# Patient Record
Sex: Female | Born: 1950 | Race: White | Hispanic: No | Marital: Married | Smoking: Never smoker
Health system: Southern US, Community
[De-identification: ages and names within clinical notes are randomized; demographics above are authoritative.]

## PROBLEM LIST (undated history)

## (undated) DIAGNOSIS — Z801 Family history of malignant neoplasm of trachea, bronchus and lung: Secondary | ICD-10-CM

## (undated) DIAGNOSIS — E119 Type 2 diabetes mellitus without complications: Secondary | ICD-10-CM

## (undated) DIAGNOSIS — Z8489 Family history of other specified conditions: Secondary | ICD-10-CM

## (undated) DIAGNOSIS — M1990 Unspecified osteoarthritis, unspecified site: Secondary | ICD-10-CM

## (undated) DIAGNOSIS — T7840XA Allergy, unspecified, initial encounter: Secondary | ICD-10-CM

## (undated) DIAGNOSIS — K5190 Ulcerative colitis, unspecified, without complications: Secondary | ICD-10-CM

## (undated) DIAGNOSIS — E079 Disorder of thyroid, unspecified: Secondary | ICD-10-CM

## (undated) DIAGNOSIS — H269 Unspecified cataract: Secondary | ICD-10-CM

## (undated) DIAGNOSIS — T8859XA Other complications of anesthesia, initial encounter: Secondary | ICD-10-CM

## (undated) DIAGNOSIS — Z800 Family history of malignant neoplasm of digestive organs: Secondary | ICD-10-CM

## (undated) DIAGNOSIS — G4730 Sleep apnea, unspecified: Secondary | ICD-10-CM

## (undated) DIAGNOSIS — I10 Essential (primary) hypertension: Secondary | ICD-10-CM

## (undated) DIAGNOSIS — K219 Gastro-esophageal reflux disease without esophagitis: Secondary | ICD-10-CM

## (undated) DIAGNOSIS — Z923 Personal history of irradiation: Secondary | ICD-10-CM

## (undated) HISTORY — DX: Family history of malignant neoplasm of trachea, bronchus and lung: Z80.1

## (undated) HISTORY — DX: Allergy, unspecified, initial encounter: T78.40XA

## (undated) HISTORY — PX: CHOLECYSTECTOMY: SHX55

## (undated) HISTORY — DX: Type 2 diabetes mellitus without complications: E11.9

## (undated) HISTORY — DX: Disorder of thyroid, unspecified: E07.9

## (undated) HISTORY — DX: Unspecified osteoarthritis, unspecified site: M19.90

## (undated) HISTORY — DX: Unspecified cataract: H26.9

## (undated) HISTORY — DX: Family history of malignant neoplasm of digestive organs: Z80.0

## (undated) HISTORY — DX: Essential (primary) hypertension: I10

---
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
## 1999-02-04 ENCOUNTER — Encounter: Payer: Self-pay | Admitting: Obstetrics and Gynecology

## 1999-02-04 ENCOUNTER — Encounter: Admission: RE | Admit: 1999-02-04 | Discharge: 1999-02-04 | Payer: Self-pay | Admitting: Obstetrics and Gynecology

## 2000-02-08 ENCOUNTER — Encounter: Payer: Self-pay | Admitting: Obstetrics and Gynecology

## 2000-02-08 ENCOUNTER — Encounter: Admission: RE | Admit: 2000-02-08 | Discharge: 2000-02-08 | Payer: Self-pay | Admitting: Obstetrics and Gynecology

## 2001-02-15 ENCOUNTER — Encounter: Admission: RE | Admit: 2001-02-15 | Discharge: 2001-02-15 | Payer: Self-pay | Admitting: Obstetrics and Gynecology

## 2001-02-15 ENCOUNTER — Encounter: Payer: Self-pay | Admitting: Obstetrics and Gynecology

## 2001-07-18 ENCOUNTER — Ambulatory Visit (HOSPITAL_COMMUNITY): Admission: RE | Admit: 2001-07-18 | Discharge: 2001-07-18 | Payer: Self-pay | Admitting: Gastroenterology

## 2002-02-18 ENCOUNTER — Encounter: Payer: Self-pay | Admitting: Obstetrics and Gynecology

## 2002-02-18 ENCOUNTER — Encounter: Admission: RE | Admit: 2002-02-18 | Discharge: 2002-02-18 | Payer: Self-pay | Admitting: Obstetrics and Gynecology

## 2002-05-05 ENCOUNTER — Encounter: Admission: RE | Admit: 2002-05-05 | Discharge: 2002-05-05 | Payer: Self-pay | Admitting: *Deleted

## 2002-05-19 ENCOUNTER — Encounter: Admission: RE | Admit: 2002-05-19 | Discharge: 2002-08-17 | Payer: Self-pay

## 2003-02-20 ENCOUNTER — Encounter: Admission: RE | Admit: 2003-02-20 | Discharge: 2003-02-20 | Payer: Self-pay | Admitting: Obstetrics and Gynecology

## 2003-06-04 ENCOUNTER — Ambulatory Visit (HOSPITAL_BASED_OUTPATIENT_CLINIC_OR_DEPARTMENT_OTHER): Admission: RE | Admit: 2003-06-04 | Discharge: 2003-06-04 | Payer: Self-pay | Admitting: Family Medicine

## 2004-03-08 ENCOUNTER — Encounter: Admission: RE | Admit: 2004-03-08 | Discharge: 2004-03-08 | Payer: Self-pay | Admitting: Obstetrics and Gynecology

## 2006-06-07 ENCOUNTER — Encounter: Admission: RE | Admit: 2006-06-07 | Discharge: 2006-06-07 | Payer: Self-pay | Admitting: Obstetrics and Gynecology

## 2007-06-11 ENCOUNTER — Encounter: Admission: RE | Admit: 2007-06-11 | Discharge: 2007-06-11 | Payer: Self-pay | Admitting: Obstetrics and Gynecology

## 2008-07-09 ENCOUNTER — Encounter: Admission: RE | Admit: 2008-07-09 | Discharge: 2008-07-09 | Payer: Self-pay | Admitting: Obstetrics and Gynecology

## 2008-12-22 ENCOUNTER — Encounter: Admission: RE | Admit: 2008-12-22 | Discharge: 2008-12-22 | Payer: Self-pay | Admitting: Obstetrics and Gynecology

## 2009-01-26 ENCOUNTER — Ambulatory Visit (HOSPITAL_COMMUNITY): Admission: RE | Admit: 2009-01-26 | Discharge: 2009-01-27 | Payer: Self-pay | Admitting: General Surgery

## 2009-01-26 ENCOUNTER — Encounter (INDEPENDENT_AMBULATORY_CARE_PROVIDER_SITE_OTHER): Payer: Self-pay | Admitting: General Surgery

## 2009-07-12 ENCOUNTER — Encounter: Admission: RE | Admit: 2009-07-12 | Discharge: 2009-07-12 | Payer: Self-pay | Admitting: Obstetrics and Gynecology

## 2010-04-10 LAB — GLUCOSE, CAPILLARY
Glucose-Capillary: 124 mg/dL — ABNORMAL HIGH (ref 70–99)
Glucose-Capillary: 130 mg/dL — ABNORMAL HIGH (ref 70–99)
Glucose-Capillary: 138 mg/dL — ABNORMAL HIGH (ref 70–99)
Glucose-Capillary: 147 mg/dL — ABNORMAL HIGH (ref 70–99)

## 2010-04-25 LAB — DIFFERENTIAL
Basophils Absolute: 0 10*3/uL (ref 0.0–0.1)
Basophils Relative: 0 % (ref 0–1)
Eosinophils Absolute: 0.1 10*3/uL (ref 0.0–0.7)
Eosinophils Relative: 2 % (ref 0–5)
Lymphocytes Relative: 35 % (ref 12–46)
Lymphs Abs: 2.6 10*3/uL (ref 0.7–4.0)
Monocytes Absolute: 0.5 10*3/uL (ref 0.1–1.0)
Monocytes Relative: 7 % (ref 3–12)
Neutro Abs: 4.2 10*3/uL (ref 1.7–7.7)
Neutrophils Relative %: 56 % (ref 43–77)

## 2010-04-25 LAB — CBC
HCT: 40.1 % (ref 36.0–46.0)
Hemoglobin: 13.9 g/dL (ref 12.0–15.0)
MCHC: 34.6 g/dL (ref 30.0–36.0)
MCV: 88.5 fL (ref 78.0–100.0)
Platelets: 289 10*3/uL (ref 150–400)
RBC: 4.53 MIL/uL (ref 3.87–5.11)
RDW: 13.2 % (ref 11.5–15.5)
WBC: 7.4 10*3/uL (ref 4.0–10.5)

## 2010-04-25 LAB — COMPREHENSIVE METABOLIC PANEL
ALT: 30 U/L (ref 0–35)
AST: 22 U/L (ref 0–37)
Albumin: 4 g/dL (ref 3.5–5.2)
Alkaline Phosphatase: 74 U/L (ref 39–117)
BUN: 12 mg/dL (ref 6–23)
CO2: 28 mEq/L (ref 19–32)
Calcium: 9.9 mg/dL (ref 8.4–10.5)
Chloride: 103 mEq/L (ref 96–112)
Creatinine, Ser: 0.55 mg/dL (ref 0.4–1.2)
GFR calc Af Amer: >60 mL/min (ref >60)
GFR calc non Af Amer: >60 mL/min (ref >60)
Glucose, Bld: 122 mg/dL — ABNORMAL HIGH (ref 70–99)
Potassium: 4.4 mEq/L (ref 3.5–5.1)
Sodium: 138 mEq/L (ref 135–145)
Total Bilirubin: 0.6 mg/dL (ref 0.3–1.2)
Total Protein: 7 g/dL (ref 6.0–8.3)

## 2010-04-25 LAB — PROTIME-INR
INR: 0.97 (ref 0.00–1.49)
Prothrombin Time: 12.8 seconds (ref 11.6–15.2)

## 2010-06-06 ENCOUNTER — Other Ambulatory Visit: Payer: Self-pay | Admitting: Family Medicine

## 2010-06-06 DIAGNOSIS — Z1231 Encounter for screening mammogram for malignant neoplasm of breast: Secondary | ICD-10-CM

## 2010-06-10 NOTE — Procedures (Signed)
[place]  Patient:    Michelle, Sherman Visit Number: [id number] MRN: [id number]          Service Type: END Location: ENDO Attending Physician:  Juanita Craver Dictated by:   Nelwyn Salisbury, M.D. Proc. Date: 07/18/01 Admit Date:  07/18/2001   CC:         Suszanne Conners, M.D.   Procedure Report  DATE OF BIRTH:  August 13, 1920  PROCEDURE PERFORMED:  Screening colonoscopy  ENDOSCOPIST:  Nelwyn Salisbury, M.D.  INSTRUMENT:  Olympus video colonoscope.  INDICATION FOR PROCEDURE:  Rectal bleeding in a 60 year old white female with a family history of Crohns disease.  Rule out colonic polyps, masses, hemorrhoids, etc.  PREPROCEDURE PREPARATION:  Informed consent was procured from the patient. The patient was fasting for eight hours prior to the procedure and prepped with a bottle of magnesium citrate and a gallon of NuLytely the night prior to the procedure.  PREPROCEDURE PHYSICAL:  VITAL SIGNS:  Stable.  NECK:  Supple.  CHEST:  Clear to auscultation.  HEART:  S1, S2 regular.  ABDOMEN:  Soft with normal abdominal bowel sounds.  DESCRIPTION OF THE PROCEDURE:  The patient was placed in the left lateral decubitus position and sedated with 100 mg of Demerol and 10 mg of Versed intravenously.  Once the patient was adequately sedated and maintained on low-flow oxygen and continuous cardiac monitoring, the Olympus video colonoscope was advanced from the rectum to the cecum without difficulty. There was evidence of large left-sided diverticula and small nonbleeding internal hemorrhoids seen on retroflexion in the rectum.  The rest of the colonic mucosa up to the cecum appeared normal and healthy.  The appendiceal orifice and the ileocecal valve were clearly visualized and photographed.  The right colon, transverse colon, and proximal left colon appeared normal.  IMPRESSION: 1. Small nonbleeding internal hemorrhoids. 2. Left-sided  diverticulosis. 3. Normal-appearing proximal left colon, transverse colon, right colon, and    cecum.  RECOMMENDATIONS: 1. High-fiber diet with liberal fluid intake has bee advocated; 15 to    20 g of fiber in the diet has been advised. 2. Outpatient followup is advised in the next two weeks. Dictated by:   Nelwyn Salisbury, M.D. Attending Physician:  Juanita Craver DD:  07/18/01 TD:  07/19/01 Job: [id number]

## 2010-06-28 DIAGNOSIS — G4733 Obstructive sleep apnea (adult) (pediatric): Secondary | ICD-10-CM | POA: Insufficient documentation

## 2010-07-14 ENCOUNTER — Ambulatory Visit
Admission: RE | Admit: 2010-07-14 | Discharge: 2010-07-14 | Disposition: A | Discharge status: Home / Self Care | Source: Ambulatory Visit | Attending: Family Medicine | Admitting: Family Medicine

## 2010-07-14 DIAGNOSIS — Z1231 Encounter for screening mammogram for malignant neoplasm of breast: Secondary | ICD-10-CM

## 2010-10-04 ENCOUNTER — Other Ambulatory Visit: Payer: Self-pay | Admitting: Family Medicine

## 2010-10-04 DIAGNOSIS — E039 Hypothyroidism, unspecified: Secondary | ICD-10-CM | POA: Insufficient documentation

## 2010-10-04 DIAGNOSIS — Z780 Asymptomatic menopausal state: Secondary | ICD-10-CM

## 2010-10-04 DIAGNOSIS — M1710 Unilateral primary osteoarthritis, unspecified knee: Secondary | ICD-10-CM | POA: Insufficient documentation

## 2010-10-07 ENCOUNTER — Ambulatory Visit
Admission: RE | Admit: 2010-10-07 | Discharge: 2010-10-07 | Disposition: A | Discharge status: Home / Self Care | Source: Ambulatory Visit | Attending: Family Medicine | Admitting: Family Medicine

## 2010-10-07 DIAGNOSIS — Z780 Asymptomatic menopausal state: Secondary | ICD-10-CM

## 2010-10-17 DIAGNOSIS — K648 Other hemorrhoids: Secondary | ICD-10-CM | POA: Insufficient documentation

## 2010-10-17 DIAGNOSIS — K5730 Diverticulosis of large intestine without perforation or abscess without bleeding: Secondary | ICD-10-CM | POA: Insufficient documentation

## 2010-10-17 HISTORY — DX: Other hemorrhoids: K64.8

## 2010-10-17 HISTORY — DX: Diverticulosis of large intestine without perforation or abscess without bleeding: K57.30

## 2011-06-08 ENCOUNTER — Other Ambulatory Visit: Payer: Self-pay | Admitting: Family Medicine

## 2011-06-08 DIAGNOSIS — Z1231 Encounter for screening mammogram for malignant neoplasm of breast: Secondary | ICD-10-CM

## 2011-07-25 ENCOUNTER — Ambulatory Visit
Admission: RE | Admit: 2011-07-25 | Discharge: 2011-07-25 | Disposition: A | Discharge status: Home / Self Care | Source: Ambulatory Visit | Attending: Family Medicine | Admitting: Family Medicine

## 2011-07-25 DIAGNOSIS — Z1231 Encounter for screening mammogram for malignant neoplasm of breast: Secondary | ICD-10-CM

## 2011-08-23 LAB — HM DIABETES EYE EXAM

## 2011-10-05 DIAGNOSIS — K5190 Ulcerative colitis, unspecified, without complications: Secondary | ICD-10-CM | POA: Insufficient documentation

## 2011-10-05 DIAGNOSIS — J309 Allergic rhinitis, unspecified: Secondary | ICD-10-CM | POA: Insufficient documentation

## 2012-01-15 DIAGNOSIS — E1169 Type 2 diabetes mellitus with other specified complication: Secondary | ICD-10-CM | POA: Insufficient documentation

## 2012-06-19 ENCOUNTER — Other Ambulatory Visit: Payer: Self-pay

## 2012-06-19 DIAGNOSIS — Z1231 Encounter for screening mammogram for malignant neoplasm of breast: Secondary | ICD-10-CM

## 2012-07-25 ENCOUNTER — Ambulatory Visit
Admission: RE | Admit: 2012-07-25 | Discharge: 2012-07-25 | Disposition: A | Discharge status: Home / Self Care | Source: Ambulatory Visit

## 2012-07-25 DIAGNOSIS — Z1231 Encounter for screening mammogram for malignant neoplasm of breast: Secondary | ICD-10-CM

## 2013-06-30 ENCOUNTER — Other Ambulatory Visit: Payer: Self-pay

## 2013-06-30 DIAGNOSIS — Z1231 Encounter for screening mammogram for malignant neoplasm of breast: Secondary | ICD-10-CM

## 2013-07-28 ENCOUNTER — Ambulatory Visit
Admission: RE | Admit: 2013-07-28 | Discharge: 2013-07-28 | Disposition: A | Discharge status: Home / Self Care | Source: Ambulatory Visit

## 2013-07-28 ENCOUNTER — Encounter (INDEPENDENT_AMBULATORY_CARE_PROVIDER_SITE_OTHER): Payer: Self-pay

## 2013-07-28 DIAGNOSIS — Z1231 Encounter for screening mammogram for malignant neoplasm of breast: Secondary | ICD-10-CM

## 2013-08-29 ENCOUNTER — Encounter: Payer: Self-pay | Admitting: Family Medicine

## 2013-08-29 LAB — HM DIABETES EYE EXAM

## 2014-06-25 ENCOUNTER — Other Ambulatory Visit: Payer: Self-pay

## 2014-06-25 DIAGNOSIS — Z1231 Encounter for screening mammogram for malignant neoplasm of breast: Secondary | ICD-10-CM

## 2014-07-31 ENCOUNTER — Ambulatory Visit
Admission: RE | Admit: 2014-07-31 | Discharge: 2014-07-31 | Disposition: A | Discharge status: Home / Self Care | Payer: Medicare Other | Source: Ambulatory Visit

## 2014-07-31 DIAGNOSIS — Z1231 Encounter for screening mammogram for malignant neoplasm of breast: Secondary | ICD-10-CM

## 2014-09-04 LAB — HM DIABETES EYE EXAM

## 2014-10-22 LAB — HM PAP SMEAR: HM Pap smear: NORMAL

## 2014-10-27 ENCOUNTER — Other Ambulatory Visit: Payer: Self-pay | Admitting: Family Medicine

## 2014-10-27 DIAGNOSIS — E2839 Other primary ovarian failure: Secondary | ICD-10-CM

## 2014-12-30 ENCOUNTER — Ambulatory Visit
Admission: RE | Admit: 2014-12-30 | Discharge: 2014-12-30 | Disposition: A | Discharge status: Home / Self Care | Payer: Medicare Other | Source: Ambulatory Visit | Attending: Family Medicine | Admitting: Family Medicine

## 2014-12-30 DIAGNOSIS — E2839 Other primary ovarian failure: Secondary | ICD-10-CM

## 2015-02-15 DIAGNOSIS — M65331 Trigger finger, right middle finger: Secondary | ICD-10-CM | POA: Diagnosis not present

## 2015-02-15 DIAGNOSIS — M7581 Other shoulder lesions, right shoulder: Secondary | ICD-10-CM | POA: Diagnosis not present

## 2015-02-15 DIAGNOSIS — M7701 Medial epicondylitis, right elbow: Secondary | ICD-10-CM | POA: Diagnosis not present

## 2015-02-15 DIAGNOSIS — G5601 Carpal tunnel syndrome, right upper limb: Secondary | ICD-10-CM | POA: Diagnosis not present

## 2015-02-16 DIAGNOSIS — K5190 Ulcerative colitis, unspecified, without complications: Secondary | ICD-10-CM | POA: Diagnosis not present

## 2015-02-16 DIAGNOSIS — K5730 Diverticulosis of large intestine without perforation or abscess without bleeding: Secondary | ICD-10-CM | POA: Diagnosis not present

## 2015-05-17 DIAGNOSIS — J0100 Acute maxillary sinusitis, unspecified: Secondary | ICD-10-CM | POA: Diagnosis not present

## 2015-05-17 DIAGNOSIS — J069 Acute upper respiratory infection, unspecified: Secondary | ICD-10-CM | POA: Diagnosis not present

## 2015-05-26 DIAGNOSIS — Z6841 Body Mass Index (BMI) 40.0 and over, adult: Secondary | ICD-10-CM | POA: Diagnosis not present

## 2015-05-26 DIAGNOSIS — E1159 Type 2 diabetes mellitus with other circulatory complications: Secondary | ICD-10-CM | POA: Diagnosis not present

## 2015-05-26 DIAGNOSIS — E119 Type 2 diabetes mellitus without complications: Secondary | ICD-10-CM | POA: Diagnosis not present

## 2015-05-26 DIAGNOSIS — I10 Essential (primary) hypertension: Secondary | ICD-10-CM | POA: Diagnosis not present

## 2015-05-26 DIAGNOSIS — K5100 Ulcerative (chronic) pancolitis without complications: Secondary | ICD-10-CM | POA: Diagnosis not present

## 2015-07-23 ENCOUNTER — Other Ambulatory Visit: Payer: Self-pay | Admitting: Family Medicine

## 2015-07-23 DIAGNOSIS — Z1231 Encounter for screening mammogram for malignant neoplasm of breast: Secondary | ICD-10-CM

## 2015-08-02 ENCOUNTER — Ambulatory Visit
Admission: RE | Admit: 2015-08-02 | Discharge: 2015-08-02 | Disposition: A | Discharge status: Home / Self Care | Payer: Medicare Other | Source: Ambulatory Visit | Attending: Family Medicine | Admitting: Family Medicine

## 2015-08-02 DIAGNOSIS — Z1231 Encounter for screening mammogram for malignant neoplasm of breast: Secondary | ICD-10-CM | POA: Diagnosis not present

## 2015-08-26 DIAGNOSIS — Z6835 Body mass index (BMI) 35.0-35.9, adult: Secondary | ICD-10-CM | POA: Diagnosis not present

## 2015-08-26 DIAGNOSIS — E1169 Type 2 diabetes mellitus with other specified complication: Secondary | ICD-10-CM | POA: Diagnosis not present

## 2015-08-26 DIAGNOSIS — E119 Type 2 diabetes mellitus without complications: Secondary | ICD-10-CM | POA: Diagnosis not present

## 2015-08-26 DIAGNOSIS — Z1159 Encounter for screening for other viral diseases: Secondary | ICD-10-CM | POA: Diagnosis not present

## 2015-08-26 DIAGNOSIS — E559 Vitamin D deficiency, unspecified: Secondary | ICD-10-CM | POA: Diagnosis not present

## 2015-08-26 DIAGNOSIS — I10 Essential (primary) hypertension: Secondary | ICD-10-CM | POA: Diagnosis not present

## 2015-08-26 DIAGNOSIS — E039 Hypothyroidism, unspecified: Secondary | ICD-10-CM | POA: Diagnosis not present

## 2015-08-26 DIAGNOSIS — E782 Mixed hyperlipidemia: Secondary | ICD-10-CM | POA: Diagnosis not present

## 2015-08-26 DIAGNOSIS — E1159 Type 2 diabetes mellitus with other circulatory complications: Secondary | ICD-10-CM | POA: Diagnosis not present

## 2015-08-26 LAB — HM HEPATITIS C SCREENING LAB: HM Hepatitis Screen: NEGATIVE

## 2015-09-23 DIAGNOSIS — H2513 Age-related nuclear cataract, bilateral: Secondary | ICD-10-CM | POA: Diagnosis not present

## 2015-09-23 DIAGNOSIS — E119 Type 2 diabetes mellitus without complications: Secondary | ICD-10-CM | POA: Diagnosis not present

## 2015-09-23 DIAGNOSIS — H5213 Myopia, bilateral: Secondary | ICD-10-CM | POA: Diagnosis not present

## 2015-09-23 LAB — HM DIABETES EYE EXAM

## 2015-10-08 DIAGNOSIS — S8992XA Unspecified injury of left lower leg, initial encounter: Secondary | ICD-10-CM | POA: Diagnosis not present

## 2015-12-06 DIAGNOSIS — E119 Type 2 diabetes mellitus without complications: Secondary | ICD-10-CM | POA: Diagnosis not present

## 2015-12-06 DIAGNOSIS — Z0000 Encounter for general adult medical examination without abnormal findings: Secondary | ICD-10-CM | POA: Diagnosis not present

## 2015-12-06 DIAGNOSIS — M533 Sacrococcygeal disorders, not elsewhere classified: Secondary | ICD-10-CM | POA: Diagnosis not present

## 2015-12-06 DIAGNOSIS — Z23 Encounter for immunization: Secondary | ICD-10-CM | POA: Diagnosis not present

## 2015-12-06 DIAGNOSIS — D1721 Benign lipomatous neoplasm of skin and subcutaneous tissue of right arm: Secondary | ICD-10-CM | POA: Diagnosis not present

## 2015-12-24 DIAGNOSIS — R2231 Localized swelling, mass and lump, right upper limb: Secondary | ICD-10-CM | POA: Diagnosis not present

## 2015-12-24 DIAGNOSIS — I10 Essential (primary) hypertension: Secondary | ICD-10-CM | POA: Diagnosis not present

## 2015-12-24 HISTORY — PX: SKIN SURGERY: SHX2413

## 2015-12-27 DIAGNOSIS — R2231 Localized swelling, mass and lump, right upper limb: Secondary | ICD-10-CM | POA: Insufficient documentation

## 2016-01-21 DIAGNOSIS — Z79899 Other long term (current) drug therapy: Secondary | ICD-10-CM | POA: Diagnosis not present

## 2016-01-21 DIAGNOSIS — E785 Hyperlipidemia, unspecified: Secondary | ICD-10-CM | POA: Diagnosis not present

## 2016-01-21 DIAGNOSIS — Z882 Allergy status to sulfonamides status: Secondary | ICD-10-CM | POA: Diagnosis not present

## 2016-01-21 DIAGNOSIS — E039 Hypothyroidism, unspecified: Secondary | ICD-10-CM | POA: Diagnosis not present

## 2016-01-21 DIAGNOSIS — E11618 Type 2 diabetes mellitus with other diabetic arthropathy: Secondary | ICD-10-CM | POA: Diagnosis not present

## 2016-01-21 DIAGNOSIS — D171 Benign lipomatous neoplasm of skin and subcutaneous tissue of trunk: Secondary | ICD-10-CM | POA: Diagnosis not present

## 2016-01-21 DIAGNOSIS — E669 Obesity, unspecified: Secondary | ICD-10-CM | POA: Diagnosis not present

## 2016-01-21 DIAGNOSIS — Z6837 Body mass index (BMI) 37.0-37.9, adult: Secondary | ICD-10-CM | POA: Diagnosis not present

## 2016-01-21 DIAGNOSIS — R222 Localized swelling, mass and lump, trunk: Secondary | ICD-10-CM | POA: Diagnosis not present

## 2016-01-21 DIAGNOSIS — I10 Essential (primary) hypertension: Secondary | ICD-10-CM | POA: Diagnosis not present

## 2016-01-21 DIAGNOSIS — M1990 Unspecified osteoarthritis, unspecified site: Secondary | ICD-10-CM | POA: Diagnosis not present

## 2016-01-21 DIAGNOSIS — G4730 Sleep apnea, unspecified: Secondary | ICD-10-CM | POA: Diagnosis not present

## 2016-01-21 DIAGNOSIS — Z7984 Long term (current) use of oral hypoglycemic drugs: Secondary | ICD-10-CM | POA: Diagnosis not present

## 2016-01-21 DIAGNOSIS — K219 Gastro-esophageal reflux disease without esophagitis: Secondary | ICD-10-CM | POA: Diagnosis not present

## 2016-01-21 DIAGNOSIS — Z7982 Long term (current) use of aspirin: Secondary | ICD-10-CM | POA: Diagnosis not present

## 2016-01-21 DIAGNOSIS — R2231 Localized swelling, mass and lump, right upper limb: Secondary | ICD-10-CM | POA: Diagnosis not present

## 2016-01-28 DIAGNOSIS — Z9889 Other specified postprocedural states: Secondary | ICD-10-CM | POA: Diagnosis not present

## 2016-01-28 DIAGNOSIS — Z86018 Personal history of other benign neoplasm: Secondary | ICD-10-CM | POA: Diagnosis not present

## 2016-02-14 DIAGNOSIS — J40 Bronchitis, not specified as acute or chronic: Secondary | ICD-10-CM | POA: Diagnosis not present

## 2016-02-14 DIAGNOSIS — I10 Essential (primary) hypertension: Secondary | ICD-10-CM | POA: Diagnosis not present

## 2016-02-17 DIAGNOSIS — K5190 Ulcerative colitis, unspecified, without complications: Secondary | ICD-10-CM | POA: Diagnosis not present

## 2016-02-17 DIAGNOSIS — K625 Hemorrhage of anus and rectum: Secondary | ICD-10-CM | POA: Diagnosis not present

## 2016-02-17 DIAGNOSIS — Z86010 Personal history of colonic polyps: Secondary | ICD-10-CM | POA: Diagnosis not present

## 2016-02-17 DIAGNOSIS — K5730 Diverticulosis of large intestine without perforation or abscess without bleeding: Secondary | ICD-10-CM | POA: Diagnosis not present

## 2016-02-17 DIAGNOSIS — Z1211 Encounter for screening for malignant neoplasm of colon: Secondary | ICD-10-CM | POA: Diagnosis not present

## 2016-03-15 DIAGNOSIS — Z6835 Body mass index (BMI) 35.0-35.9, adult: Secondary | ICD-10-CM | POA: Diagnosis not present

## 2016-03-15 DIAGNOSIS — E1159 Type 2 diabetes mellitus with other circulatory complications: Secondary | ICD-10-CM | POA: Diagnosis not present

## 2016-03-15 DIAGNOSIS — I10 Essential (primary) hypertension: Secondary | ICD-10-CM | POA: Diagnosis not present

## 2016-03-15 DIAGNOSIS — E119 Type 2 diabetes mellitus without complications: Secondary | ICD-10-CM | POA: Diagnosis not present

## 2016-03-29 DIAGNOSIS — K625 Hemorrhage of anus and rectum: Secondary | ICD-10-CM | POA: Diagnosis not present

## 2016-03-29 DIAGNOSIS — K635 Polyp of colon: Secondary | ICD-10-CM | POA: Diagnosis not present

## 2016-03-29 DIAGNOSIS — Z1211 Encounter for screening for malignant neoplasm of colon: Secondary | ICD-10-CM | POA: Diagnosis not present

## 2016-03-29 DIAGNOSIS — Z86010 Personal history of colonic polyps: Secondary | ICD-10-CM | POA: Diagnosis not present

## 2016-03-29 DIAGNOSIS — D125 Benign neoplasm of sigmoid colon: Secondary | ICD-10-CM | POA: Diagnosis not present

## 2016-04-11 DIAGNOSIS — I10 Essential (primary) hypertension: Secondary | ICD-10-CM | POA: Diagnosis not present

## 2016-04-11 DIAGNOSIS — G5621 Lesion of ulnar nerve, right upper limb: Secondary | ICD-10-CM | POA: Diagnosis not present

## 2016-06-14 DIAGNOSIS — R072 Precordial pain: Secondary | ICD-10-CM | POA: Diagnosis not present

## 2016-06-14 DIAGNOSIS — E1159 Type 2 diabetes mellitus with other circulatory complications: Secondary | ICD-10-CM | POA: Diagnosis not present

## 2016-06-14 DIAGNOSIS — K5100 Ulcerative (chronic) pancolitis without complications: Secondary | ICD-10-CM | POA: Diagnosis not present

## 2016-06-14 DIAGNOSIS — I10 Essential (primary) hypertension: Secondary | ICD-10-CM | POA: Diagnosis not present

## 2016-06-14 DIAGNOSIS — E119 Type 2 diabetes mellitus without complications: Secondary | ICD-10-CM | POA: Diagnosis not present

## 2016-06-14 LAB — HEMOGLOBIN A1C: Hemoglobin A1C: 6.2

## 2016-06-20 DIAGNOSIS — R079 Chest pain, unspecified: Secondary | ICD-10-CM | POA: Diagnosis not present

## 2016-06-22 ENCOUNTER — Other Ambulatory Visit: Payer: Self-pay | Admitting: Family Medicine

## 2016-06-22 DIAGNOSIS — Z1231 Encounter for screening mammogram for malignant neoplasm of breast: Secondary | ICD-10-CM

## 2016-08-04 ENCOUNTER — Ambulatory Visit
Admission: RE | Admit: 2016-08-04 | Discharge: 2016-08-04 | Disposition: A | Discharge status: Home / Self Care | Payer: Medicare Other | Source: Ambulatory Visit | Attending: Family Medicine | Admitting: Family Medicine

## 2016-08-04 DIAGNOSIS — Z1231 Encounter for screening mammogram for malignant neoplasm of breast: Secondary | ICD-10-CM

## 2016-11-01 ENCOUNTER — Ambulatory Visit (INDEPENDENT_AMBULATORY_CARE_PROVIDER_SITE_OTHER): Payer: Medicare Other | Admitting: Physician Assistant

## 2016-11-01 ENCOUNTER — Encounter: Payer: Self-pay | Admitting: Physician Assistant

## 2016-11-01 VITALS — BP 102/60 | HR 76 | Temp 98.8°F | Resp 14 | Ht 59.5 in | Wt 194.0 lb

## 2016-11-01 DIAGNOSIS — I152 Hypertension secondary to endocrine disorders: Secondary | ICD-10-CM | POA: Insufficient documentation

## 2016-11-01 DIAGNOSIS — Z23 Encounter for immunization: Secondary | ICD-10-CM | POA: Diagnosis not present

## 2016-11-01 DIAGNOSIS — I10 Essential (primary) hypertension: Secondary | ICD-10-CM

## 2016-11-01 DIAGNOSIS — E119 Type 2 diabetes mellitus without complications: Secondary | ICD-10-CM | POA: Insufficient documentation

## 2016-11-01 DIAGNOSIS — Z6835 Body mass index (BMI) 35.0-35.9, adult: Secondary | ICD-10-CM

## 2016-11-01 DIAGNOSIS — E1159 Type 2 diabetes mellitus with other circulatory complications: Secondary | ICD-10-CM | POA: Insufficient documentation

## 2016-11-01 HISTORY — DX: Morbid (severe) obesity due to excess calories: E66.01

## 2016-11-01 HISTORY — DX: Type 2 diabetes mellitus without complications: E11.9

## 2016-11-01 LAB — COMPREHENSIVE METABOLIC PANEL
ALT: 14 U/L (ref 0–35)
AST: 15 U/L (ref 0–37)
Albumin: 4.5 g/dL (ref 3.5–5.2)
Alkaline Phosphatase: 73 U/L (ref 39–117)
BUN: 14 mg/dL (ref 6–23)
CO2: 31 mEq/L (ref 19–32)
Calcium: 10.4 mg/dL (ref 8.4–10.5)
Chloride: 101 mEq/L (ref 96–112)
Creatinine, Ser: 0.64 mg/dL (ref 0.40–1.20)
GFR: 98.31 mL/min (ref >60.00)
Glucose, Bld: 101 mg/dL — ABNORMAL HIGH (ref 70–99)
Potassium: 4.1 mEq/L (ref 3.5–5.1)
Sodium: 139 mEq/L (ref 135–145)
Total Bilirubin: 0.5 mg/dL (ref 0.2–1.2)
Total Protein: 7.1 g/dL (ref 6.0–8.3)

## 2016-11-01 LAB — LIPID PANEL
Cholesterol: 144 mg/dL (ref 0–200)
HDL: 65.5 mg/dL (ref >39.00)
LDL Cholesterol: 47 mg/dL (ref 0–99)
NonHDL: 78.79
Total CHOL/HDL Ratio: 2
Triglycerides: 158 mg/dL — ABNORMAL HIGH (ref 0.0–149.0)
VLDL: 31.6 mg/dL (ref 0.0–40.0)

## 2016-11-01 LAB — HEMOGLOBIN A1C: Hgb A1c MFr Bld: 6.5 % (ref 4.6–6.5)

## 2016-11-01 MED ORDER — CYCLOBENZAPRINE HCL 10 MG PO TABS: 10.0000 mg | ORAL_TABLET | Freq: Every day | ORAL | 0 refills | Status: DC

## 2016-11-01 MED ORDER — CANAGLIFLOZIN 300 MG PO TABS: 300.0000 mg | ORAL_TABLET | Freq: Every day | ORAL | 0 refills | Status: DC

## 2016-11-01 NOTE — Progress Notes (Signed)
Pre visit review using our clinic review tool, if applicable. No additional management support is needed unless otherwise documented below in the visit note. 

## 2016-11-01 NOTE — Patient Instructions (Signed)
Please go to the lab for blood work. I will call with results.  Please continue current medication regimen. You can increase Turmeric to 1000 mg daily.  Start the Flexeril in the evening for a few days to help with shoulder.  Please follow-up with Dr. Jonni Sanger as scheduled.

## 2016-11-01 NOTE — Assessment & Plan Note (Signed)
Repeat labs today. Foot exam updated. No abnormal findings. Flu shot given. Pneumonia series up-to-date. Eye exam scheduled in 2 weeks. Continue current regimen. Will alter according to lab findings.

## 2016-11-01 NOTE — Progress Notes (Signed)
Patient presents to clinic today for a diabetes follow-up. Patient belongs to Dr. Billey Chang who will be starting here next month. This is patient's first visit to this practice.   Patient with history of DM II, previously well controlled. Denies history of retinopathy, nephropathy or neuropathy. Is currently on a regimen of Invokana 300 mg daily and Metformin 1000 mg BID. Is taking as directed. Denies side effect. Is checking sugars daily. Endorses 110-120 fasting. Is also on ACEI and statin medication, taking as directed. Is eating a well-balanced, low-carb diet. Is not exercising regularly due to time constraints (taking care of her husband). Foot exam due -- denies current concern. Has eye exam scheduled in 2 weeks. Due for flu shot. Pneumonia is up-to-date. Patient denies chest pain, palpitations, lightheadedness, dizziness, vision changes or frequent headaches.  BP Readings from Last 3 Encounters:  11/01/16 102/60   Past Medical History:  Diagnosis Date  . Allergy   . Diabetes mellitus without complication ([place])   . Hypertension     No current outpatient prescriptions on file prior to visit.   No current facility-administered medications on file prior to visit.     Allergies  Allergen Reactions  . Nickel Other (See Comments) and Rash    drainage  . Sulfa Antibiotics Rash    Family History  Problem Relation Age of Onset  . Ulcerative colitis Mother   . Diabetes Mother   . Cancer Father        Lung  . Heart disease Father   . Varicose Veins Sister   . Cancer Brother        Pancreatic  . Diabetes Brother   . Diabetes Maternal Aunt   . Diabetes Maternal Uncle   . Heart attack Paternal Grandfather   . Crohn's disease Brother   . Ulcerative colitis Brother   . Diabetes Brother     Social History   Social History  . Marital status: Married    Spouse name: N/A  . Number of children: N/A  . Years of education: N/A   Social History Main Topics  . Smoking  status: Never Smoker  . Smokeless tobacco: Never Used  . Alcohol use Yes     Comment: occa  . Drug use: No  . Sexual activity: Yes   Other Topics Concern  . None   Social History Narrative  . None   Review of Systems  Constitutional: Negative for fever and weight loss.  HENT: Negative for ear discharge, ear pain, hearing loss and tinnitus.   Eyes: Negative for blurred vision, double vision, photophobia and pain.  Respiratory: Negative for cough and shortness of breath.   Cardiovascular: Negative for chest pain and palpitations.  Gastrointestinal: Negative for abdominal pain, blood in stool, constipation, diarrhea, heartburn, melena, nausea and vomiting.  Genitourinary: Negative for dysuria, flank pain, frequency, hematuria and urgency.  Musculoskeletal: Negative for falls.  Neurological: Negative for dizziness, loss of consciousness and headaches.  Endo/Heme/Allergies: Negative for environmental allergies.  Psychiatric/Behavioral: Negative for depression, hallucinations, substance abuse and suicidal ideas. The patient is not nervous/anxious and does not have insomnia.    BP 102/60   Pulse 76   Temp 98.8 F (37.1 C) (Oral)   Resp 14   Ht 4' 11.5" (1.511 m)   Wt 194 lb (88 kg)   SpO2 98%   BMI 38.53 kg/m   Physical Exam  Constitutional: She is oriented to person, place, and time and well-developed, well-nourished, and in no distress.  HENT:  Head: Normocephalic and atraumatic.  Eyes: Conjunctivae are normal.  Neck: Neck supple. No thyromegaly present.  Cardiovascular: Normal rate, regular rhythm, normal heart sounds and intact distal pulses.   Pulmonary/Chest: Effort normal and breath sounds normal. No respiratory distress. She has no wheezes. She has no rales. She exhibits no tenderness.  Lymphadenopathy:    She has no cervical adenopathy.  Neurological: She is alert and oriented to person, place, and time.  Skin: Skin is warm and dry. No rash noted.  Vitals  reviewed.  Assessment/Plan: Controlled type 2 diabetes mellitus without complication, without long-term current use of insulin ([place]) Repeat labs today. Foot exam updated. No abnormal findings. Flu shot given. Pneumonia series up-to-date. Eye exam scheduled in 2 weeks. Continue current regimen. Will alter according to lab findings.    Leeanne Rio, PA-C

## 2016-11-17 DIAGNOSIS — H5213 Myopia, bilateral: Secondary | ICD-10-CM | POA: Diagnosis not present

## 2016-11-17 DIAGNOSIS — H2513 Age-related nuclear cataract, bilateral: Secondary | ICD-10-CM | POA: Diagnosis not present

## 2016-11-17 DIAGNOSIS — E119 Type 2 diabetes mellitus without complications: Secondary | ICD-10-CM | POA: Diagnosis not present

## 2016-11-17 LAB — HM DIABETES EYE EXAM

## 2016-12-21 ENCOUNTER — Ambulatory Visit (INDEPENDENT_AMBULATORY_CARE_PROVIDER_SITE_OTHER): Payer: Medicare Other

## 2016-12-21 ENCOUNTER — Encounter: Payer: Self-pay | Admitting: Family Medicine

## 2016-12-21 ENCOUNTER — Ambulatory Visit (INDEPENDENT_AMBULATORY_CARE_PROVIDER_SITE_OTHER): Payer: Medicare Other | Admitting: Family Medicine

## 2016-12-21 VITALS — BP 124/60 | HR 76 | Temp 98.4°F | Ht 60.0 in | Wt 192.8 lb

## 2016-12-21 DIAGNOSIS — G4733 Obstructive sleep apnea (adult) (pediatric): Secondary | ICD-10-CM

## 2016-12-21 DIAGNOSIS — Z23 Encounter for immunization: Secondary | ICD-10-CM | POA: Diagnosis not present

## 2016-12-21 DIAGNOSIS — E119 Type 2 diabetes mellitus without complications: Secondary | ICD-10-CM | POA: Diagnosis not present

## 2016-12-21 DIAGNOSIS — I10 Essential (primary) hypertension: Secondary | ICD-10-CM | POA: Diagnosis not present

## 2016-12-21 DIAGNOSIS — E559 Vitamin D deficiency, unspecified: Secondary | ICD-10-CM

## 2016-12-21 DIAGNOSIS — E1159 Type 2 diabetes mellitus with other circulatory complications: Secondary | ICD-10-CM

## 2016-12-21 DIAGNOSIS — M79671 Pain in right foot: Secondary | ICD-10-CM | POA: Diagnosis not present

## 2016-12-21 DIAGNOSIS — I152 Hypertension secondary to endocrine disorders: Secondary | ICD-10-CM

## 2016-12-21 DIAGNOSIS — Z6835 Body mass index (BMI) 35.0-35.9, adult: Secondary | ICD-10-CM

## 2016-12-21 DIAGNOSIS — M79605 Pain in left leg: Secondary | ICD-10-CM

## 2016-12-21 DIAGNOSIS — Z9989 Dependence on other enabling machines and devices: Secondary | ICD-10-CM | POA: Diagnosis not present

## 2016-12-21 DIAGNOSIS — M19071 Primary osteoarthritis, right ankle and foot: Secondary | ICD-10-CM | POA: Diagnosis not present

## 2016-12-21 DIAGNOSIS — E039 Hypothyroidism, unspecified: Secondary | ICD-10-CM

## 2016-12-21 DIAGNOSIS — M79604 Pain in right leg: Secondary | ICD-10-CM | POA: Diagnosis not present

## 2016-12-21 LAB — MICROALBUMIN / CREATININE URINE RATIO
Creatinine,U: 75.4 mg/dL
Microalb Creat Ratio: 1.3 mg/g (ref 0.0–30.0)
Microalb, Ur: 0.9 mg/dL (ref 0.0–1.9)

## 2016-12-21 LAB — CBC WITH DIFFERENTIAL/PLATELET
Basophils Absolute: 0.1 10*3/uL (ref 0.0–0.1)
Basophils Relative: 1.1 % (ref 0.0–3.0)
Eosinophils Absolute: 0.2 10*3/uL (ref 0.0–0.7)
Eosinophils Relative: 3.4 % (ref 0.0–5.0)
HCT: 45 % (ref 36.0–46.0)
Hemoglobin: 14.7 g/dL (ref 12.0–15.0)
Lymphocytes Relative: 26 % (ref 12.0–46.0)
Lymphs Abs: 1.6 10*3/uL (ref 0.7–4.0)
MCHC: 32.7 g/dL (ref 30.0–36.0)
MCV: 90.4 fl (ref 78.0–100.0)
Monocytes Absolute: 0.4 10*3/uL (ref 0.1–1.0)
Monocytes Relative: 6.8 % (ref 3.0–12.0)
Neutro Abs: 3.7 10*3/uL (ref 1.4–7.7)
Neutrophils Relative %: 62.7 % (ref 43.0–77.0)
Platelets: 327 10*3/uL (ref 150.0–400.0)
RBC: 4.98 Mil/uL (ref 3.87–5.11)
RDW: 13.6 % (ref 11.5–15.5)
WBC: 6 10*3/uL (ref 4.0–10.5)

## 2016-12-21 LAB — VITAMIN D 25 HYDROXY (VIT D DEFICIENCY, FRACTURES): VITD: 51.59 ng/mL (ref 30.00–100.00)

## 2016-12-21 LAB — TSH: TSH: 0.94 u[IU]/mL (ref 0.35–4.50)

## 2016-12-21 NOTE — Progress Notes (Signed)
Subjective  Chief Complaint  Patient presents with  . Annual Exam    Patient is here today for a CPE. She is currently fasting.      HPI: Michelle Sherman is a 66 y.o. female who presents to [place] [facility] today for a Female Wellness Visit.   Wellness Visit: annual visit with health maintenance review and exam without Pap   Michelle Sherman is here to reestablish care with me.  She was seen for a diabetic, hypertension hyperlipidemia follow-up visit last month.  Reviewed her lab work and results for those.  She is here today for a complete physical.  She is feeling well.  Home life is stressful with her husband who continues to have medical problems, however she is handling things well.  Her chronic medical problems are well controlled  She does complain of mild lower extremity aches and pains especially upon awakening.  She complains of right lateral foot pain it has been ongoing for 2 months.  Aches at night.  No burning pain or numbness.  No injury.  Not related to activity.  Denies joint pain or back pain.  She does have history of arthritis of her knees.  She is using turmeric to help with inflammation but has not tried any other strategies.  Her activity level is fair and consist mostly of helping care for her husband who is a bilateral amputee.  She does not do focused exercise.  Obesity: Her weight is stable.  She continues to try to follow a diabetic friendly diet.  She is not extremely motivated to lose weight. Lifestyle: Body mass index is 37.65 kg/m. Wt Readings from Last 3 Encounters:  12/21/16 192 lb 12.8 oz (87.5 kg)  11/01/16 194 lb (88 kg)   Diet: general Exercise: never, none  Patient Active Problem List   Diagnosis Date Noted  . Controlled type 2 diabetes mellitus without complication, without long-term current use of insulin ([place]) 11/01/2016    Priority: High  . Hypertension associated with diabetes ([place]) 11/01/2016    Priority: High  . Severe  obesity (BMI 35.0-35.9 with comorbidity) ([place]) 11/01/2016    Priority: High  . Combined hyperlipidemia associated with type 2 diabetes mellitus ([place]) 01/15/2012    Priority: High  . Ulcerative colitis without complications ([place]) 01/25/7251    Priority: High  . Acquired hypothyroidism 10/04/2010    Priority: High  . OSA on CPAP 06/28/2010    Priority: High  . Diverticulosis of colon 10/17/2010    Priority: Medium  . Osteoarthrosis of knee 10/04/2010    Priority: Medium  . Allergic rhinitis 10/05/2011    Priority: Low  . Internal hemorrhoids 10/17/2010    Priority: Low  . Mass of skin of shoulder, right 12/27/2015   Health Maintenance  Topic Date Due  . HEMOGLOBIN A1C  05/02/2017  . MAMMOGRAM  08/04/2017  . FOOT EXAM  11/01/2017  . OPHTHALMOLOGY EXAM  11/17/2017  . DEXA SCAN  12/30/2019  . TETANUS/TDAP  10/03/2020  . COLONOSCOPY  04/01/2026  . INFLUENZA VACCINE  Completed  . Hepatitis C Screening  Completed  . PNA vac Low Risk Adult  Completed   Immunization History  Administered Date(s) Administered  . Influenza, High Dose Seasonal PF 12/06/2015, 11/01/2016  . Pneumococcal Conjugate-13 10/22/2014  . Pneumococcal Polysaccharide-23 10/05/2011, 12/21/2016  . Tdap 10/04/2010  . Zoster 10/03/2009   We updated and reviewed the patient's past history in detail and it is documented below. Allergies: Patient  reports that  she drinks alcohol. Past Medical History Patient  has a past medical history of Allergy, Diabetes mellitus without complication ([place]), and Hypertension. Past Surgical History Patient  has a past surgical history that includes Skin surgery (12/2015); Cholecystectomy; and Tonsillectomy and adenoidectomy (1956). Social History   Socioeconomic History  . Marital status: Married    Spouse name: None  . Number of children: None  . Years of education: None  . Highest education level: None  Social Needs  . Financial resource strain: None  . Food insecurity -  worry: None  . Food insecurity - inability: None  . Transportation needs - medical: None  . Transportation needs - non-medical: None  Occupational History  . None  Tobacco Use  . Smoking status: Never Smoker  . Smokeless tobacco: Never Used  Substance and Sexual Activity  . Alcohol use: Yes    Comment: occa  . Drug use: No  . Sexual activity: Yes  Other Topics Concern  . None  Social History Narrative   Married, cares for disabled husband, no children, no tob, Etoh or drug use; no exercise   Family History  Problem Relation Age of Onset  . Ulcerative colitis Mother   . Diabetes Mother   . Cancer Father        Lung  . Heart disease Father   . Varicose Veins Sister   . Cancer Brother        Pancreatic  . Diabetes Brother   . Diabetes Maternal Aunt   . Diabetes Maternal Uncle   . Heart attack Paternal Grandfather   . Crohn's disease Brother   . Ulcerative colitis Brother   . Diabetes Brother     Review of Systems: Constitutional: negative for fever or malaise Ophthalmic: negative for photophobia, double vision or loss of vision Cardiovascular: negative for chest pain, dyspnea on exertion, or new LE swelling Respiratory: negative for SOB or persistent cough Gastrointestinal: negative for abdominal pain, change in bowel habits or melena Genitourinary: negative for dysuria or gross hematuria, no abnormal uterine bleeding or disharge Musculoskeletal: negative for new gait disturbance or muscular weakness Integumentary: negative for new or persistent rashes, no breast lumps Neurological: negative for TIA or stroke symptoms Psychiatric: negative for SI or delusions Allergic/Immunologic: negative for hives  Patient Care Team    Relationship Specialty Notifications Start End  Leamon Arnt, MD PCP - General Family Medicine  08/04/16     Objective  Vitals: BP 124/60 (BP Location: Right Arm, Patient Position: Sitting, Cuff Size: Large)   Pulse 76   Temp 98.4 F (36.9  C) (Oral)   Ht 5' (1.524 m)   Wt 192 lb 12.8 oz (87.5 kg)   SpO2 97%   BMI 37.65 kg/m  General:  Well developed, well nourished, no acute distress  Psych:  Alert and orientedx3,normal mood and affect HEENT:  Normocephalic, atraumatic, non-icteric sclera, PERRL, oropharynx is clear without mass or exudate, supple neck without adenopathy, mass or thyromegaly Cardiovascular:  Normal S1, S2, RRR without gallop, rub or murmur, nondisplaced PMI Respiratory:  Good breath sounds bilaterally, CTAB with normal respiratory effort Gastrointestinal: normal bowel sounds, soft, non-tender, no noted masses. No HSM MSK: no deformities, contusions. Joints are without erythema or swelling. Spine and CVA region are nontender Skin:  Warm, no rashes or suspicious lesions noted Neurologic:    Mental status is normal. CN 2-11 are normal. Gross motor and sensory exams are normal. Normal gait. No tremor Breast Exam: No mass, skin retraction or nipple  discharge is appreciated in either breast. No axillary adenopathy. Fibrocystic changes are noted bilaterally without localized mass No visits with results within 1 Day(s) from this visit.  Latest known visit with results is:  Office Visit on 11/01/2016  Component Date Value Ref Range Status  . HM Hepatitis Screen 08/26/2015 Negative-Validated   Final  . HM Pap smear 10/22/2014 Normal   Final  . Hemoglobin A1C 06/14/2016 6.2   Final  . Hgb A1c MFr Bld 11/01/2016 6.5  4.6 - 6.5 % Final  . Sodium 11/01/2016 139  135 - 145 mEq/L Final  . Potassium 11/01/2016 4.1  3.5 - 5.1 mEq/L Final  . Chloride 11/01/2016 101  96 - 112 mEq/L Final  . CO2 11/01/2016 31  19 - 32 mEq/L Final  . Glucose, Bld 11/01/2016 101* 70 - 99 mg/dL Final  . BUN 11/01/2016 14  6 - 23 mg/dL Final  . Creatinine, Ser 11/01/2016 0.64  0.40 - 1.20 mg/dL Final  . Total Bilirubin 11/01/2016 0.5  0.2 - 1.2 mg/dL Final  . Alkaline Phosphatase 11/01/2016 73  39 - 117 U/L Final  . AST 11/01/2016 15  0 - 37  U/L Final  . ALT 11/01/2016 14  0 - 35 U/L Final  . Total Protein 11/01/2016 7.1  6.0 - 8.3 g/dL Final  . Albumin 11/01/2016 4.5  3.5 - 5.2 g/dL Final  . Calcium 11/01/2016 10.4  8.4 - 10.5 mg/dL Final  . GFR 11/01/2016 98.31  >60.00 mL/min Final  . Cholesterol 11/01/2016 144  0 - 200 mg/dL Final  . Triglycerides 11/01/2016 158.0* 0.0 - 149.0 mg/dL Final  . HDL 11/01/2016 65.50  >39.00 mg/dL Final  . VLDL 11/01/2016 31.6  0.0 - 40.0 mg/dL Final  . LDL Cholesterol 11/01/2016 47  0 - 99 mg/dL Final  . Total CHOL/HDL Ratio 11/01/2016 2   Final  . NonHDL 11/01/2016 78.79   Final  . HM Diabetic Eye Exam 11/17/2016 No Retinopathy  No Retinopathy Final     Assessment  1. Controlled type 2 diabetes mellitus without complication, without long-term current use of insulin ([place])   2. Acquired hypothyroidism   3. Hypertension associated with diabetes ([place])   4. Severe obesity (BMI 35.0-35.9 with comorbidity) ([place])   5. OSA on CPAP   6. Pain in both lower extremities   7. Vitamin D deficiency   8. Right foot pain      Plan  Female Wellness Visit:  Age appropriate Health Maintenance and Prevention measures were discussed with patient. Included topics are cancer screening recommendations, ways to keep healthy (see AVS) including dietary and exercise recommendations, regular eye and dental care, use of seat belts, and avoidance of moderate alcohol use and tobacco use.  Discussed strengthening exercise program to keep her functional level high.  BMI: discussed patient's BMI and encouraged positive lifestyle modifications to help get to or maintain a target BMI.  HM needs and immunizations were addressed and ordered. See below for orders. See HM and immunization section for updates.  Updated final Pneumovax today  Routine labs and screening tests ordered including cmp, cbc and lipids where appropriate.  Discussed recommendations regarding Vit D and calcium supplementation (see AVS)   Right  foot pain: May be related to gait abnormalities, muscle tightness or tendinopathy.  Check x-ray to rule out bony abnormality.  Recommend stretching exercises for lower extremity pain.  Rule out vitamin D deficiency with lab work today.  If persists, recommend podiatry consult to consider orthotics  Hypothyroidism: Recheck thyroid levels today.  Clinically stable  Lower extremity pain: Most likely related to muscle tightness and inflammation.  Also could be radiated pain from knee arthritis.  Recommend Tylenol and stretching exercises.  Diabetes, hypertension, hyperlipidemia are well controlled.  Sleep apnea is well controlled with CPAP.  Follow up: Return in about 3 months (around 03/22/2017) for your Annual Wellness Visit.   Commons side effects, risks, benefits, and alternatives for medications and treatment plan prescribed today were discussed, and the patient expressed understanding of the given instructions. Patient is instructed to call or message via MyChart if he/she has any questions or concerns regarding our treatment plan. No barriers to understanding were identified. We discussed Red Flag symptoms and signs in detail. Patient expressed understanding regarding what to do in case of urgent or emergency type symptoms.   Medication list was reconciled, printed and provided to the patient in AVS. Patient instructions and summary information was reviewed with the patient as documented in the AVS. This note was prepared with assistance of Dragon voice recognition software. Occasional wrong-word or sound-a-like substitutions may have occurred due to the inherent limitations of voice recognition software  Orders Placed This Encounter  Procedures  . DG Foot Complete Right  . Pneumococcal polysaccharide vaccine 23-valent greater than or equal to 2yo subcutaneous/IM  . Microalbumin / creatinine urine ratio  . TSH  . CBC with Differential/Platelet  . Vitamin D (25 hydroxy)   No medication  changes were made today.

## 2016-12-21 NOTE — Patient Instructions (Addendum)
It was so good seeing you again! Thank you for establishing with my new practice and allowing me to continue caring for you. It means a lot to me.   Please return in 3 months for your Medicare annual wellness visit, and return in 6 months with me for a diabetes and blood pressure check.   Start working on home exercises for strengthening: core exercises: plank, supermans, Legs: squats, chest: wall push ups etc. You tube is a good source for videos.   Please do these things to maintain good health!   Exercise at least 30-45 minutes a day,  4-5 days a week.   Eat a low-fat diet with lots of fruits and vegetables, up to 7-9 servings per day.  Drink plenty of water daily. Try to drink 8 8oz glasses per day.  Seatbelts can save your life. Always wear your seatbelt.  Place Smoke Detectors on every level of your home and check batteries every year.  Schedule an appointment with an eye doctor for an eye exam every 1-2 years  Safe sex - use condoms to protect yourself from STDs if you could be exposed to these types of infections. Use birth control if you do not want to become pregnant and are sexually active.  Avoid heavy alcohol use. If you drink, keep it to less than 2 drinks/day and not every day.  [place].  Choose someone you trust that could speak for you if you became unable to speak for yourself.  Depression is common in our stressful world.If you're feeling down or losing interest in things you normally enjoy, please come in for a visit.  If anyone is threatening or hurting you, please get help. Physical or Emotional Violence is never OK.

## 2016-12-22 NOTE — Progress Notes (Signed)
Please call patient: I have reviewed his/her lab results. All lab results are stable. No medication changes are needed. Please ask her to sign up for mychart so lab results can be released. thanks

## 2016-12-22 NOTE — Progress Notes (Signed)
Please call patient: I have reviewed his/her xray results. Her foot show diffuse arthritic changes; this is the likely cause of her foot pain. Tylenol twice a day may help. We can send her to podiatry if needed in the future to see if orthotics will help.

## 2017-01-08 ENCOUNTER — Encounter: Payer: Self-pay | Admitting: Family Medicine

## 2017-01-08 ENCOUNTER — Other Ambulatory Visit: Payer: Self-pay | Admitting: Physician Assistant

## 2017-01-08 MED ORDER — LEVOTHYROXINE SODIUM 112 MCG PO TABS: 112.0000 ug | ORAL_TABLET | Freq: Every day | ORAL | 1 refills | Status: DC

## 2017-01-08 NOTE — Telephone Encounter (Signed)
Okay to order new C-pap machine for pt?

## 2017-01-09 ENCOUNTER — Other Ambulatory Visit: Payer: Self-pay | Admitting: Family Medicine

## 2017-01-09 DIAGNOSIS — Z9989 Dependence on other enabling machines and devices: Principal | ICD-10-CM

## 2017-01-09 DIAGNOSIS — G4733 Obstructive sleep apnea (adult) (pediatric): Secondary | ICD-10-CM

## 2017-01-09 MED ORDER — CANAGLIFLOZIN 300 MG PO TABS: 300.0000 mg | ORAL_TABLET | Freq: Every day | ORAL | 3 refills | Status: DC

## 2017-01-12 ENCOUNTER — Telehealth: Payer: Self-pay | Admitting: *Deleted

## 2017-01-12 DIAGNOSIS — Z9989 Dependence on other enabling machines and devices: Principal | ICD-10-CM

## 2017-01-12 DIAGNOSIS — G4733 Obstructive sleep apnea (adult) (pediatric): Secondary | ICD-10-CM

## 2017-01-12 NOTE — Telephone Encounter (Signed)
Spoke to pt, told her I sent order to [facility] and they messaged me back about settings and sleep study. Asked pt if she has seen anyone recently regarding her CPAP? Pt said no, not since 2012. Told pt okay so we need to send a referral to Pulmonary to have you evaluated again and possible sleep study. Pt verbalized understanding and said that is fine. Told her I will put order in and someone will contact you to schedule an appointment. Pt verbalized understanding.

## 2017-01-26 ENCOUNTER — Telehealth: Payer: Self-pay | Admitting: Internal Medicine

## 2017-01-26 NOTE — Telephone Encounter (Signed)
Sleep Study has been received from [facility]. Attached to CY's charts for Monday. Will close this encounter.

## 2017-01-26 NOTE — Telephone Encounter (Signed)
Per patient's chart, she has already been setup with a CPAP machine and uses [facility]. Called [facility] to get a copy of her download and SS. [facility] stated that she has not seen anyone for her OSA therefore the last download they have is from 2014. They will fax over a copy of the sleep study.   Will route this to [place] so that she is aware to look out for the sleep study.

## 2017-01-29 ENCOUNTER — Ambulatory Visit (INDEPENDENT_AMBULATORY_CARE_PROVIDER_SITE_OTHER): Payer: Medicare Other | Admitting: Internal Medicine

## 2017-01-29 ENCOUNTER — Encounter: Payer: Self-pay | Admitting: Internal Medicine

## 2017-01-29 DIAGNOSIS — Z9989 Dependence on other enabling machines and devices: Secondary | ICD-10-CM | POA: Diagnosis not present

## 2017-01-29 DIAGNOSIS — G4733 Obstructive sleep apnea (adult) (pediatric): Secondary | ICD-10-CM

## 2017-01-29 DIAGNOSIS — Z6835 Body mass index (BMI) 35.0-35.9, adult: Secondary | ICD-10-CM | POA: Diagnosis not present

## 2017-01-29 NOTE — Assessment & Plan Note (Signed)
She weighs less than she did with her original sleep study and understands the importance of weight management in dealing with her sleep apnea, as well as its effect on other comorbidities.

## 2017-01-29 NOTE — Assessment & Plan Note (Signed)
It is in her best interest to get an updated sleep study which we can do as a home study.  That will requalify her for new CPAP machine.  I anticipate changing to AutoPap 5-12 as discussed with her.

## 2017-01-29 NOTE — Patient Instructions (Signed)
Order- please schedule unattended home sleep test  Dx OSA  Please call me about 2 weeks after your sleep test, to ask for results and recommendations. We anticipate being able to get you an autopap machine, probably set around 5-12, before I see you next.   Please let us know if you have questions or problems.

## 2017-01-29 NOTE — Progress Notes (Signed)
01/29/17-67 year old female never smoker for sleep evaluation. Medical problem list includes hypothyroid, DM 2, hyperlipidemia, allergic rhinitis, HBP, ulcerative colitis, morbid obesity, NPSG 06/04/06- AHI ? 23/ hr, desaturation to 66%, CPAP titrated to 8, body weight 242 lbs. Sleep Consult; Dr. Jonni Sanger at [facility] uses CPAP now-machine is about 68 years old-CPAP is worn out and needs new machine. Sleep Study attached.  Epworth score 2 She sleeps every night with CPAP and has been uncomfortable on the rare occasion when she slept without it.  Old machine is now giving her a message that "it has exceeded its motor life". ENT surgery-tonsils.  She denies active heart or lung disease.  Reports losing about 50 pounds since her original sleep study.  Prior to Admission medications   Medication Sig Start Date End Date Taking? Authorizing Provider  aspirin EC 81 MG tablet Take 1 tablet by mouth daily.   Yes [provider]  atorvastatin (LIPITOR) 10 MG tablet Take 1 tablet by mouth every evening. 04/03/16 04/03/17 Yes [provider]  BIOTIN 5000 PO Take 1 tablet by mouth daily.   Yes [provider]  Calcium Citrate-Vitamin D 200-250 MG-UNIT TABS Take 1 tablet by mouth daily.   Yes [provider]  canagliflozin (INVOKANA) 300 MG TABS tablet Take 1 tablet (300 mg total) by mouth daily. 01/09/17  Yes Leamon Arnt, MD  Cholecalciferol (VITAMIN D3) 2000 units capsule Take 1 capsule by mouth daily.   Yes [provider]  cyclobenzaprine (FLEXERIL) 10 MG tablet Take 1 tablet (10 mg total) by mouth at bedtime. 11/01/16  Yes Brunetta Jeans, PA-C  diclofenac sodium (VOLTAREN) 1 % GEL Place 4 g onto the skin QID. 10/04/10  Yes [provider]  fexofenadine (ALLEGRA) 180 MG tablet Take 1 tablet by mouth daily.   Yes [provider]  fluticasone (FLONASE) 50 MCG/ACT nasal spray Place 2 sprays into the nose daily as needed. 01/09/14  Yes  [provider]  Glucosamine HCl 500 MG TABS Take 1 tablet by mouth daily.   Yes [provider]  levothyroxine (SYNTHROID) 112 MCG tablet Take 1 tablet (112 mcg total) by mouth daily. 01/08/17  Yes Leamon Arnt, MD  mesalamine (LIALDA) 1.2 g EC tablet Take by mouth. Take 2 tablet in am and 2 tablet in pm   Yes [provider]  metFORMIN (GLUCOPHAGE) 1000 MG tablet TAKE 1 TABLET BY MOUTH 2 TIMES DAILY WITH MEALS. 07/10/16  Yes [provider]  Misc Natural Products (TURMERIC CURCUMIN) CAPS Take 1 capsule by mouth daily.   Yes [provider]  Multiple Vitamin (MULTIVITAMIN) tablet Take 1 tablet by mouth daily.   Yes [provider]  ramipril (ALTACE) 10 MG capsule TAKE 1 CAPSULE BY MOUTH DAILY. 07/10/16  Yes [provider]   Past Medical History:  Diagnosis Date  . Allergy   . Diabetes mellitus without complication ([place])   . Hypertension    Past Surgical History:  Procedure Laterality Date  . CHOLECYSTECTOMY    . SKIN SURGERY  12/2015   Fatty tumor removed  . TONSILLECTOMY AND ADENOIDECTOMY  1956   Family History  Problem Relation Age of Onset  . Ulcerative colitis Mother   . Diabetes Mother   . Cancer Father        Lung  . Heart disease Father   . Varicose Veins Sister   . Cancer Brother        Pancreatic  . Diabetes Brother   .  Diabetes Maternal Aunt   . Diabetes Maternal Uncle   . Heart attack Paternal Grandfather   . Crohn's disease Brother   . Ulcerative colitis Brother   . Diabetes Brother    Social History   Socioeconomic History  . Marital status: Married    Spouse name: Not on file  . Number of children: Not on file  . Years of education: Not on file  . Highest education level: Not on file  Social Needs  . Financial resource strain: Not on file  . Food insecurity - worry: Not on file  . Food insecurity - inability: Not on file  . Transportation needs - medical: Not on file  . Transportation  needs - non-medical: Not on file  Occupational History  . Not on file  Tobacco Use  . Smoking status: Never Smoker  . Smokeless tobacco: Never Used  Substance and Sexual Activity  . Alcohol use: Yes    Comment: occa  . Drug use: No  . Sexual activity: Yes  Other Topics Concern  . Not on file  Social History Narrative   Married, cares for disabled husband, no children, no tob, Etoh or drug use; no exercise   .ROS-see HPI   Negative unless "+" Constitutional:    weight loss, night sweats, fevers, chills, fatigue, lassitude. HEENT:    headaches, difficulty swallowing, tooth/dental problems, sore throat,       sneezing, itching, ear ache, nasal congestion, post nasal drip, snoring CV:    chest pain, orthopnea, PND, swelling in lower extremities, anasarca,                                  dizziness, palpitations Resp:   shortness of breath with exertion or at rest.                productive cough,   non-productive cough, coughing up of blood.              change in color of mucus.  wheezing.   Skin:    rash or lesions. GI:  No-   heartburn, indigestion, abdominal pain, nausea, vomiting, diarrhea,                 change in bowel habits, loss of appetite GU: dysuria, change in color of urine, no urgency or frequency.   flank pain. MS:   joint pain, +stiffness, decreased range of motion, back pain. Neuro-     nothing unusual Psych:  change in mood or affect.  depression or anxiety.   memory loss.  OBJ- Physical Exam General- Alert, Oriented, Affect-appropriate, Distress- none acute, + overweight Skin- rash-none, lesions- none, excoriation- none Lymphadenopathy- none Head- atraumatic            Eyes- Gross vision intact, PERRLA, conjunctivae and secretions clear            Ears- Hearing, canals-normal            Nose- Clear, no-Septal dev, mucus, polyps, erosion, perforation             Throat- Mallampati II-III , mucosa clear , drainage- none, tonsils- atrophic Neck- flexible ,  trachea midline, no stridor , thyroid nl, carotid no bruit Chest - symmetrical excursion , unlabored           Heart/CV- RRR , no murmur , no gallop  , no rub, nl s1 s2                           -  JVD- none , edema- none, stasis changes- none, varices- none           Lung-+ coarse upper airway, wheeze- none, cough- none , dullness-none, rub- none           Chest wall-  Abd-  Br/ Gen/ Rectal- Not done, not indicated Extrem- cyanosis- none, clubbing, none, atrophy- none, strength- nl Neuro- grossly intact to observation

## 2017-02-05 DIAGNOSIS — G4733 Obstructive sleep apnea (adult) (pediatric): Secondary | ICD-10-CM | POA: Diagnosis not present

## 2017-02-06 DIAGNOSIS — G4733 Obstructive sleep apnea (adult) (pediatric): Secondary | ICD-10-CM | POA: Diagnosis not present

## 2017-02-08 ENCOUNTER — Other Ambulatory Visit: Payer: Self-pay | Admitting: *Deleted

## 2017-02-08 DIAGNOSIS — Z9989 Dependence on other enabling machines and devices: Principal | ICD-10-CM

## 2017-02-08 DIAGNOSIS — G4733 Obstructive sleep apnea (adult) (pediatric): Secondary | ICD-10-CM

## 2017-02-19 ENCOUNTER — Telehealth: Payer: Self-pay | Admitting: Internal Medicine

## 2017-02-19 DIAGNOSIS — G4733 Obstructive sleep apnea (adult) (pediatric): Secondary | ICD-10-CM

## 2017-02-19 DIAGNOSIS — Z9989 Dependence on other enabling machines and devices: Principal | ICD-10-CM

## 2017-02-19 NOTE — Telephone Encounter (Signed)
Her home sleep test showed moderate obstructive sleep apnea, averaging 28 apneas/ hour, with drops in blood oxygen level.  (We are replacing an old CPAP machine. I don't know if Michelle Sherman needs a new DME.)  Order- replacement for old CPAP machine auto 5-12, mask of choice, humidifier, supplies, AirView   Dx OSA  Please be sure Michelle Sherman has a return ov in 31-90 days per insurance regs.

## 2017-02-19 NOTE — Telephone Encounter (Signed)
Patient asked to be called back at [phone].

## 2017-02-19 NOTE — Telephone Encounter (Signed)
Pt calling for HST results per last AVS instructions: Instructions      Return in about 3 months (around 04/29/2017).  Order- please schedule unattended home sleep test  Dx OSA  Please call me about 2 weeks after your sleep test, to ask for results and recommendations. We anticipate being able to get you an autopap machine, probably set around 5-12, before I see you next.      CDY- please advise on results and if she needs CPAP, thanks

## 2017-02-19 NOTE — Telephone Encounter (Signed)
Called and spoke to pt. Informed her of the results and recs per CY. Order placed for replacement CPAP. Pt verbalized understanding and requests to call back to schedule her f/u visit. Pt is aware that the appt needs to be within 31-90 after she starts using her CPAP. Nothing further needed. Will sign off.

## 2017-03-20 NOTE — Progress Notes (Signed)
Subjective:   Michelle Sherman is a 67 y.o. female who presents for Medicare Annual (Subsequent) preventive examination.  Review of Systems:  No ROS.  Medicare Wellness Visit. Additional risk factors are reflected in the social history.  Cardiac Risk Factors include: diabetes mellitus;hypertension;family history of premature cardiovascular disease;advanced age (>46mn, >>25women);obesity (BMI >30kg/m2);dyslipidemia   Sleep patterns: Sleeps 7-8 hours. Uses CPAP Home Safety/Smoke Alarms: Feels safe in home. Smoke alarms in place.  Living environment; residence and Firearm Safety: Lives with husband in 1 story home. Rail and ramp in place.  Seat Belt Safety/Bike Helmet: Wears seat belt.   Female:   Pap-2016     Mammo-08/04/2016, Negative.       Dexa scan-12/30/2014, normal. [facility].  [facility]-03/31/2016, Dr. MCollene Mares Pt reports polyp. Recall 5 years.      Objective:     Vitals: BP 130/72 (BP Location: Left Arm, Patient Position: Sitting, Cuff Size: Normal)   Pulse 72   Ht 5' (1.524 m)   Wt 194 lb 9.6 oz (88.3 kg)   SpO2 97%   BMI 38.01 kg/m   Body mass index is 38.01 kg/m.  Advanced Directives 03/21/2017  Does Patient Have a Medical Advance Directive? No  Would patient like information on creating a medical advance directive? Yes (MAU/Ambulatory/Procedural Areas - Information given)    Tobacco Social History   Tobacco Use  Smoking Status Never Smoker  Smokeless Tobacco Never Used     Counseling given: Not Answered     Past Medical History:  Diagnosis Date  . Allergy   . Diabetes mellitus [place] complication ([place]   . Hypertension    Past Surgical History:  Procedure Laterality Date  . CHOLECYSTECTOMY    . SKIN SURGERY  12/2015   Fatty tumor removed  . TONSILLECTOMY AND ADENOIDECTOMY  1956   Family History  Problem Relation Age of Onset  . Ulcerative colitis Mother   . Diabetes Mother   . Cancer Father        Lung  . Heart disease  Father   . Varicose Veins Sister   . Cancer Brother        Pancreatic  . Diabetes Brother   . Diabetes Maternal Aunt   . Diabetes Maternal Uncle   . Heart attack Paternal Grandfather   . Crohn's disease Brother   . Ulcerative colitis Brother   . Diabetes Brother    Social History   Socioeconomic History  . Marital status: Married    Spouse name: None  . Number of children: None  . Years of education: None  . Highest education level: None  Social Needs  . Financial resource strain: None  . Food insecurity - worry: None  . Food insecurity - inability: None  . Transportation needs - medical: None  . Transportation needs - non-medical: None  Occupational History  . None  Tobacco Use  . Smoking status: Never Smoker  . Smokeless tobacco: Never Used  Substance and Sexual Activity  . Alcohol use: Yes    Comment: occa  . Drug use: No  . Sexual activity: Yes  Other Topics Concern  . None  Social History Narrative   Married, cares for disabled husband, no children, no tob, Etoh or drug use; no exercise    Outpatient Encounter Medications as of 03/21/2017  Medication Sig  . aspirin EC 81 MG tablet Take 1 tablet by mouth daily.  .Marland Kitchenatorvastatin (LIPITOR) 10 MG tablet Take 1 tablet by mouth  every evening.  Marland Kitchen BIOTIN 5000 PO Take 1 tablet by mouth daily.  . Calcium Citrate-Vitamin D 200-250 MG-UNIT TABS Take 1 tablet by mouth daily.  . canagliflozin (INVOKANA) 300 MG TABS tablet Take 1 tablet (300 mg total) by mouth daily.  . Cholecalciferol (VITAMIN D3) 2000 units capsule Take 1 capsule by mouth daily.  . cyclobenzaprine (FLEXERIL) 10 MG tablet Take 1 tablet (10 mg total) by mouth at bedtime.  . diclofenac sodium (VOLTAREN) 1 % GEL Place 4 g onto the skin QID.  Marland Kitchen fexofenadine (ALLEGRA) 180 MG tablet Take 1 tablet by mouth daily.  . fluticasone (FLONASE) 50 MCG/ACT nasal spray Place 2 sprays into the nose daily as needed.  . Glucosamine HCl 500 MG TABS Take 1 tablet by mouth  daily.  Marland Kitchen levothyroxine (SYNTHROID) 112 MCG tablet Take 1 tablet (112 mcg total) by mouth daily.  . mesalamine (LIALDA) 1.2 g EC tablet Take by mouth. Take 2 tablet in am and 2 tablet in pm  . metFORMIN (GLUCOPHAGE) 1000 MG tablet TAKE 1 TABLET BY MOUTH 2 TIMES DAILY WITH MEALS.  Marland Kitchen Misc Natural Products (TURMERIC CURCUMIN) CAPS Take 1 capsule by mouth daily.  . Multiple Vitamin (MULTIVITAMIN) tablet Take 1 tablet by mouth daily.  . ramipril (ALTACE) 10 MG capsule TAKE 1 CAPSULE BY MOUTH DAILY.  Marland Kitchen Zoster Vaccine Adjuvanted [facility]) injection Inject 0.5 mLs into the muscle once for 1 dose.   No facility-administered encounter medications on file as of 03/21/2017.     Activities of Daily Living In your present state of health, do you have any difficulty performing the following activities: 03/21/2017 11/01/2016  Hearing? N N  Vision? N N  Difficulty concentrating or making decisions? N N  Walking or climbing stairs? N N  Dressing or bathing? N N  Doing errands, shopping? N N  Preparing Food and eating ? N -  Using the Toilet? N -  In the past six months, have you accidently leaked urine? N -  Do you have problems with loss of bowel control? N -  Managing your Medications? N -  Managing your Finances? N -  Housekeeping or managing your Housekeeping? N -  Some recent data might be hidden    Patient Care Team: Leamon Arnt, MD as PCP - General (Family Medicine) Deneise Lever, MD as Consulting Physician (Pulmonary Disease) Juanita Craver, MD as Consulting Physician (Gastroenterology)    Assessment:   This is a routine wellness examination for Michelle Sherman.  Exercise Activities and Dietary recommendations Current Exercise Habits: Structured exercise class, Time (Minutes): 60, Frequency (Times/Week): 2, Weekly Exercise (Minutes/Week): 120, Exercise limited by: None identified   Diet (meal preparation, eat out, water intake, caffeinated beverages, dairy products, fruits and vegetables):  Drinks water and coffee.   Weight watchers diet.     Goals    . Weight (lb) < 180 lb (81.6 kg)     Lose weight by continuing weight watchers and increasing exercise.        Fall Risk Fall Risk  03/21/2017 11/01/2016  Falls in the past year? No No   I Depression Screen PHQ 2/9 Scores 03/21/2017 11/01/2016  PHQ - 2 Score 0 1     Cognitive Function       Ad8 score reviewed for issues:  Issues making decisions: no  Less interest in hobbies / activities: no  Repeats questions, stories (family complaining): no  Trouble using ordinary gadgets (microwave, computer, phone): no  Forgets the month or year:  no  Mismanaging finances: no  Remembering appts: no  Daily problems with thinking and/or memory: no Ad8 score is=0     Immunization History  Administered Date(s) Administered  . Influenza, High Dose Seasonal PF 12/06/2015, 11/01/2016  . Pneumococcal Conjugate-13 10/22/2014  . Pneumococcal Polysaccharide-23 10/05/2011, 12/21/2016  . Tdap 10/04/2010  . Zoster 10/03/2009     Screening Tests Health Maintenance  Topic Date Due  . HEMOGLOBIN A1C  05/02/2017  . MAMMOGRAM  08/04/2017  . FOOT EXAM  11/01/2017  . OPHTHALMOLOGY EXAM  11/17/2017  . DEXA SCAN  12/30/2019  . TETANUS/TDAP  10/03/2020  . COLONOSCOPY  04/01/2026  . INFLUENZA VACCINE  Completed  . Hepatitis C Screening  Completed  . PNA vac Low Risk Adult  Completed        Plan:     Shingles vaccine at pharmacy.   Schedule bone scan with mammogram after 08/05/2017.   Continue doing brain stimulating activities (puzzles, reading, adult coloring books, staying active) to keep memory sharp.   Bring a copy of your living will and/or healthcare power of attorney to your next office visit.    I have personally reviewed and noted the following in the patient's chart:   . Medical and social history . Use of alcohol, tobacco or illicit drugs  . Current medications and supplements . Functional  ability and status . Nutritional status . Physical activity . Advanced directives . List of other physicians . Hospitalizations, surgeries, and ER visits in previous 12 months . Vitals . Screenings to include cognitive, depression, and falls . Referrals and appointments  In addition, I have reviewed and discussed with patient certain preventive protocols, quality metrics, and best practice recommendations. A written personalized care plan for preventive services as well as general preventive health recommendations were provided to patient.     Gerilyn Nestle, RN  03/21/2017  PCP Notes: -Pt requesting referral to Podiatry (discussed at last [facility]), phone note sent.  -Pt reports right upper arm pain x 2 months, appt scheduled with PCP for 03/26/17.

## 2017-03-21 ENCOUNTER — Telehealth: Payer: Self-pay

## 2017-03-21 ENCOUNTER — Ambulatory Visit (INDEPENDENT_AMBULATORY_CARE_PROVIDER_SITE_OTHER): Payer: Medicare Other

## 2017-03-21 ENCOUNTER — Other Ambulatory Visit: Payer: Self-pay

## 2017-03-21 VITALS — BP 130/72 | HR 72 | Ht 60.0 in | Wt 194.6 lb

## 2017-03-21 DIAGNOSIS — E2839 Other primary ovarian failure: Secondary | ICD-10-CM | POA: Diagnosis not present

## 2017-03-21 DIAGNOSIS — Z23 Encounter for immunization: Secondary | ICD-10-CM

## 2017-03-21 DIAGNOSIS — M79671 Pain in right foot: Secondary | ICD-10-CM

## 2017-03-21 DIAGNOSIS — Z0000 Encounter for general adult medical examination without abnormal findings: Secondary | ICD-10-CM | POA: Diagnosis not present

## 2017-03-21 MED ORDER — ZOSTER VAC RECOMB ADJUVANTED 50 MCG/0.5ML IM SUSR: 0.5000 mL | Freq: Once | INTRAMUSCULAR | 1 refills | Status: AC

## 2017-03-21 NOTE — Progress Notes (Signed)
I have reviewed the documentation from the recent AWV done by Roderic Ovens; I agree with the documentation and will follow up on any recommendations or abnormal findings as suggested.

## 2017-03-21 NOTE — Telephone Encounter (Signed)
Patient in today for AWV.  Patient continues to have foot pain as discussed at [facility] on 12/21/16, requesting referral to podiatry.

## 2017-03-21 NOTE — Patient Instructions (Addendum)
Shingles vaccine at pharmacy.   Schedule bone scan with mammogram after 08/05/2017.   Continue doing brain stimulating activities (puzzles, reading, adult coloring books, staying active) to keep memory sharp.   Bring a copy of your living will and/or healthcare power of attorney to your next office visit.  Health Maintenance, Female Adopting a healthy lifestyle and getting preventive care can go a long way to promote health and wellness. Talk with your health care provider about what schedule of regular examinations is right for you. This is a good chance for you to check in with your provider about disease prevention and staying healthy. In between checkups, there are plenty of things you can do on your own. Experts have done a lot of research about which lifestyle changes and preventive measures are most likely to keep you healthy. Ask your health care provider for more information. Weight and diet Eat a healthy diet  Be sure to include plenty of vegetables, fruits, low-fat dairy products, and lean protein.  Do not eat a lot of foods high in solid fats, added sugars, or salt.  Get regular exercise. This is one of the most important things you can do for your health. ? Most adults should exercise for at least 150 minutes each week. The exercise should increase your heart rate and make you sweat (moderate-intensity exercise). ? Most adults should also do strengthening exercises at least twice a week. This is in addition to the moderate-intensity exercise.  Maintain a healthy weight  Body mass index (BMI) is a measurement that can be used to identify possible weight problems. It estimates body fat based on height and weight. Your health care provider can help determine your BMI and help you achieve or maintain a healthy weight.  For females 41 years of age and older: ? A BMI below 18.5 is considered underweight. ? A BMI of 18.5 to 24.9 is normal. ? A BMI of 25 to 29.9 is considered  overweight. ? A BMI of 30 and above is considered obese.  Watch levels of cholesterol and blood lipids  You should start having your blood tested for lipids and cholesterol at 68 years of age, then have this test every 5 years.  You may need to have your cholesterol levels checked more often if: ? Your lipid or cholesterol levels are high. ? You are older than 68 years of age. ? You are at high risk for heart disease.  Cancer screening Lung Cancer  Lung cancer screening is recommended for adults 2-39 years old who are at high risk for lung cancer because of a history of smoking.  A yearly low-dose CT scan of the lungs is recommended for people who: ? Currently smoke. ? Have quit within the past 15 years. ? Have at least a 30-pack-year history of smoking. A pack year is smoking an average of one pack of cigarettes a day for 1 year.  Yearly screening should continue until it has been 15 years since you quit.  Yearly screening should stop if you develop a health problem that would prevent you from having lung cancer treatment.  Breast Cancer  Practice breast self-awareness. This means understanding how your breasts normally appear and feel.  It also means doing regular breast self-exams. Let your health care provider know about any changes, no matter how small.  If you are in your 20s or 30s, you should have a clinical breast exam (CBE) by a health care provider every 1-3 years as part of  a regular health exam.  If you are 19 or older, have a CBE every year. Also consider having a breast X-ray (mammogram) every year.  If you have a family history of breast cancer, talk to your health care provider about genetic screening.  If you are at high risk for breast cancer, talk to your health care provider about having an MRI and a mammogram every year.  Breast cancer gene (BRCA) assessment is recommended for women who have family members with BRCA-related cancers. BRCA-related cancers  include: ? Breast. ? Ovarian. ? Tubal. ? Peritoneal cancers.  Results of the assessment will determine the need for genetic counseling and BRCA1 and BRCA2 testing.  Cervical Cancer Your health care provider may recommend that you be screened regularly for cancer of the pelvic organs (ovaries, uterus, and vagina). This screening involves a pelvic examination, including checking for microscopic changes to the surface of your cervix (Pap test). You may be encouraged to have this screening done every 3 years, beginning at age 34.  For women ages 4-65, health care providers may recommend pelvic exams and Pap testing every 3 years, or they may recommend the Pap and pelvic exam, combined with testing for human papilloma virus (HPV), every 5 years. Some types of HPV increase your risk of cervical cancer. Testing for HPV may also be done on women of any age with unclear Pap test results.  Other health care providers may not recommend any screening for nonpregnant women who are considered low risk for pelvic cancer and who do not have symptoms. Ask your health care provider if a screening pelvic exam is right for you.  If you have had past treatment for cervical cancer or a condition that could lead to cancer, you need Pap tests and screening for cancer for at least 20 years after your treatment. If Pap tests have been discontinued, your risk factors (such as having a new sexual partner) need to be reassessed to determine if screening should resume. Some women have medical problems that increase the chance of getting cervical cancer. In these cases, your health care provider may recommend more frequent screening and Pap tests.  Colorectal Cancer  This type of cancer can be detected and often prevented.  Routine colorectal cancer screening usually begins at 68 years of age and continues through 68 years of age.  Your health care provider may recommend screening at an earlier age if you have risk factors  for colon cancer.  Your health care provider may also recommend using home test kits to check for hidden blood in the stool.  A small camera at the end of a tube can be used to examine your colon directly (sigmoidoscopy or colonoscopy). This is done to check for the earliest forms of colorectal cancer.  Routine screening usually begins at age 37.  Direct examination of the colon should be repeated every 5-10 years through 68 years of age. However, you may need to be screened more often if early forms of precancerous polyps or small growths are found.  Skin Cancer  Check your skin from head to toe regularly.  Tell your health care provider about any new moles or changes in moles, especially if there is a change in a mole's shape or color.  Also tell your health care provider if you have a mole that is larger than the size of a pencil eraser.  Always use sunscreen. Apply sunscreen liberally and repeatedly throughout the day.  Protect yourself by wearing long sleeves,  pants, a wide-brimmed hat, and sunglasses whenever you are outside.  Heart disease, diabetes, and high blood pressure  High blood pressure causes heart disease and increases the risk of stroke. High blood pressure is more likely to develop in: ? People who have blood pressure in the high end of the normal range (130-139/85-89 mm Hg). ? People who are overweight or obese. ? People who are African American.  If you are 18-39 years of age, have your blood pressure checked every 3-5 years. If you are 40 years of age or older, have your blood pressure checked every year. You should have your blood pressure measured twice-once when you are at a hospital or clinic, and once when you are not at a hospital or clinic. Record the average of the two measurements. To check your blood pressure when you are not at a hospital or clinic, you can use: ? An automated blood pressure machine at a pharmacy. ? A home blood pressure monitor.  If  you are between 55 years and 79 years old, ask your health care provider if you should take aspirin to prevent strokes.  Have regular diabetes screenings. This involves taking a blood sample to check your fasting blood sugar level. ? If you are at a normal weight and have a low risk for diabetes, have this test once every three years after 68 years of age. ? If you are overweight and have a high risk for diabetes, consider being tested at a younger age or more often. Preventing infection Hepatitis B  If you have a higher risk for hepatitis B, you should be screened for this virus. You are considered at high risk for hepatitis B if: ? You were born in a country where hepatitis B is common. Ask your health care provider which countries are considered high risk. ? Your parents were born in a high-risk country, and you have not been immunized against hepatitis B (hepatitis B vaccine). ? You have HIV or AIDS. ? You use needles to inject street drugs. ? You live with someone who has hepatitis B. ? You have had sex with someone who has hepatitis B. ? You get hemodialysis treatment. ? You take certain medicines for conditions, including cancer, organ transplantation, and autoimmune conditions.  Hepatitis C  Blood testing is recommended for: ? Everyone born from 1945 through 1965. ? Anyone with known risk factors for hepatitis C.  Sexually transmitted infections (STIs)  You should be screened for sexually transmitted infections (STIs) including gonorrhea and chlamydia if: ? You are sexually active and are younger than 68 years of age. ? You are older than 68 years of age and your health care provider tells you that you are at risk for this type of infection. ? Your sexual activity has changed since you were last screened and you are at an increased risk for chlamydia or gonorrhea. Ask your health care provider if you are at risk.  If you do not have HIV, but are at risk, it may be recommended  that you take a prescription medicine daily to prevent HIV infection. This is called pre-exposure prophylaxis (PrEP). You are considered at risk if: ? You are sexually active and do not regularly use condoms or know the HIV status of your partner(s). ? You take drugs by injection. ? You are sexually active with a partner who has HIV.  Talk with your health care provider about whether you are at high risk of being infected with HIV. If you   choose to begin PrEP, you should first be tested for HIV. You should then be tested every 3 months for as long as you are taking PrEP. Pregnancy  If you are premenopausal and you may become pregnant, ask your health care provider about preconception counseling.  If you may become pregnant, take 400 to 800 micrograms (mcg) of folic acid every day.  If you want to prevent pregnancy, talk to your health care provider about birth control (contraception). Osteoporosis and menopause  Osteoporosis is a disease in which the bones lose minerals and strength with aging. This can result in serious bone fractures. Your risk for osteoporosis can be identified using a bone density scan.  If you are 65 years of age or older, or if you are at risk for osteoporosis and fractures, ask your health care provider if you should be screened.  Ask your health care provider whether you should take a calcium or vitamin D supplement to lower your risk for osteoporosis.  Menopause may have certain physical symptoms and risks.  Hormone replacement therapy may reduce some of these symptoms and risks. Talk to your health care provider about whether hormone replacement therapy is right for you. Follow these instructions at home:  Schedule regular health, dental, and eye exams.  Stay current with your immunizations.  Do not use any tobacco products including cigarettes, chewing tobacco, or electronic cigarettes.  If you are pregnant, do not drink alcohol.  If you are  breastfeeding, limit how much and how often you drink alcohol.  Limit alcohol intake to no more than 1 drink per day for nonpregnant women. One drink equals 12 ounces of beer, 5 ounces of wine, or 1 ounces of hard liquor.  Do not use street drugs.  Do not share needles.  Ask your health care provider for help if you need support or information about quitting drugs.  Tell your health care provider if you often feel depressed.  Tell your health care provider if you have ever been abused or do not feel safe at home. This information is not intended to replace advice given to you by your health care provider. Make sure you discuss any questions you have with your health care provider. Document Released: 07/25/2010 Document Revised: 06/17/2015 Document Reviewed: 10/13/2014 Elsevier Interactive Patient Education  [employer].

## 2017-03-26 ENCOUNTER — Encounter: Payer: Self-pay | Admitting: Family Medicine

## 2017-03-26 ENCOUNTER — Other Ambulatory Visit: Payer: Self-pay

## 2017-03-26 ENCOUNTER — Ambulatory Visit (INDEPENDENT_AMBULATORY_CARE_PROVIDER_SITE_OTHER): Payer: Medicare Other | Admitting: Family Medicine

## 2017-03-26 VITALS — BP 138/90 | HR 70 | Temp 98.2°F | Ht 60.0 in | Wt 193.4 lb

## 2017-03-26 DIAGNOSIS — M7581 Other shoulder lesions, right shoulder: Secondary | ICD-10-CM

## 2017-03-26 MED ORDER — DICLOFENAC SODIUM 75 MG PO TBEC: 75.0000 mg | DELAYED_RELEASE_TABLET | Freq: Two times a day (BID) | ORAL | 0 refills | Status: DC

## 2017-03-26 NOTE — Patient Instructions (Signed)
Please follow up if symptoms do not improve or as needed.   Rotator Cuff Tendinitis Rotator cuff tendinitis is inflammation of the tough, cord-like bands that connect muscle to bone (tendons) in the rotator cuff. The rotator cuff includes all of the muscles and tendons that connect the arm to the shoulder. The rotator cuff holds the head of the upper arm bone (humerus) in the cup (fossa) of the shoulder blade (scapula). This condition can lead to a long-lasting (chronic) tear. The tear may be partial or complete. What are the causes? This condition is usually caused by overusing the rotator cuff. What increases the risk? This condition is more likely to develop in athletes and workers who frequently use their shoulder or reach over their heads. This can include activities such as:  Tennis.  Baseball or softball.  Swimming.  Construction work.  Painting.  What are the signs or symptoms? Symptoms of this condition include:  Pain spreading (radiating) from the shoulder to the upper arm.  Swelling and tenderness in front of the shoulder.  Pain when reaching, pulling, or lifting the arm above the head.  Pain when lowering the arm from above the head.  Minor pain in the shoulder when resting.  Increased pain in the shoulder at night.  Difficulty placing the arm behind the back.  How is this diagnosed? This condition is diagnosed with a medical history and physical exam. Tests may also be done, including:  X-rays.  MRI.  Ultrasounds.  CT or MR arthrogram. During this test, a contrast material is injected and then images are taken.  How is this treated? Treatment for this condition depends on the severity of the condition. In less severe cases, treatment may include:  Rest. This may be done with a sling that holds the shoulder still (immobilization). Your health care provider may also recommend avoiding activities that involve lifting your arm over your head.  Icing the  shoulder.  Anti-inflammatory medicines, such as aspirin or ibuprofen.  In more severe cases, treatment may include:  Physical therapy.  Steroid injections.  Surgery.  Follow these instructions at home: If you have a sling:  Wear the sling as told by your health care provider. Remove it only as told by your health care provider.  Loosen the sling if your fingers tingle, become numb, or turn cold and blue.  Keep the sling clean.  If the sling is not waterproof, do not let it get wet. Remove it, if allowed, or cover it with a watertight covering when you take a bath or shower. Managing pain, stiffness, and swelling  If directed, put ice on the injured area. ? If you have a removable sling, remove it as told by your health care provider. ? Put ice in a plastic bag. ? Place a towel between your skin and the bag. ? Leave the ice on for 20 minutes, 2-3 times a day.  Move your fingers often to avoid stiffness and to lessen swelling.  Raise (elevate) the injured area above the level of your heart while you are lying down.  Find a comfortable sleeping position or sleep on a recliner, if available. Driving  Do not drive or use heavy machinery while taking prescription pain medicine.  Ask your health care provider when it is safe to drive if you have a sling on your arm. Activity  Rest your shoulder as told by your health care provider.  Return to your normal activities as told by your health care provider. Ask  your health care provider what activities are safe for you.  Do any exercises or stretches as told by your health care provider.  If you do repetitive overhead tasks, take small breaks in between and include stretching exercises as told by your health care provider. General instructions  Do not use any products that contain nicotine or tobacco, such as cigarettes and e-cigarettes. These can delay healing. If you need help quitting, ask your health care provider.  Take  over-the-counter and prescription medicines only as told by your health care provider.  Keep all follow-up visits as told by your health care provider. This is important. Contact a health care provider if:  Your pain gets worse.  You have new pain in your arm, hands, or fingers.  Your pain is not relieved with medicine or does not get better after 6 weeks of treatment.  You have cracking sensations when moving your shoulder in certain directions.  You hear a snapping sound after using your shoulder, followed by severe pain and weakness. Get help right away if:  Your arm, hand, or fingers are numb or tingling.  Your arm, hand, or fingers are swollen or painful or they turn white or blue. Summary  Rotator cuff tendinitis is inflammation of the tough, cord-like bands that connect muscle to bone (tendons) in the rotator cuff.  This condition is usually caused by overusing the rotator cuff, which includes all of the muscles and tendons that connect the arm to the shoulder.  This condition is more likely to develop in athletes and workers who frequently use their shoulder or reach over their heads.  Treatment generally includes rest, anti-inflammatory medicines, and icing. In some cases, physical therapy and steroid injections may be needed. In severe cases, surgery may be needed. This information is not intended to replace advice given to you by your health care provider. Make sure you discuss any questions you have with your health care provider. Document Released: 04/01/2003 Document Revised: 12/27/2015 Document Reviewed: 12/27/2015 Elsevier Interactive Patient Education  2017 [employer].

## 2017-03-26 NOTE — Progress Notes (Signed)
Subjective  CC:  Chief Complaint  Patient presents with  . Arm Pain    Right arm pain that starts at elbow and goes down to hand x couple months    HPI: Michelle Sherman is a 67 y.o. female who presents to the office today to address the problems listed above in the chief complaint.  Complains of right arm pain.  Started several months ago and is intermittent.  Describes shooting pain down her arm from right shoulder.  Intermittent numbness down into the fingers.  Worse if he lies on her right side at night.  No injury.  Reports pain when lifting heavy objects above head.  No weakness.  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  She has remote history of rotator cuff tendinopathy.  She denies neck pain. I reviewed the patients updated PMH, FH, and SocHx.    Patient Active Problem List   Diagnosis Date Noted  . Controlled type 2 diabetes mellitus without complication, without long-term current use of insulin ([place]) 11/01/2016    Priority: High  . Hypertension associated with diabetes ([place]) 11/01/2016    Priority: High  . Severe obesity (BMI 35.0-35.9 with comorbidity) ([place]) 11/01/2016    Priority: High  . Combined hyperlipidemia associated with type 2 diabetes mellitus ([place]) 01/15/2012    Priority: High  . Ulcerative colitis without complications ([place]) 62/22/9798    Priority: High  . Acquired hypothyroidism 10/04/2010    Priority: High  . OSA on CPAP 06/28/2010    Priority: High  . Diverticulosis of colon 10/17/2010    Priority: Medium  . Osteoarthrosis of knee 10/04/2010    Priority: Medium  . Allergic rhinitis 10/05/2011    Priority: Low  . Internal hemorrhoids 10/17/2010    Priority: Low  . Mass of skin of shoulder, right 12/27/2015   Current Meds  Medication Sig  . aspirin EC 81 MG tablet Take 1 tablet by mouth daily.  Marland Kitchen atorvastatin (LIPITOR) 10 MG tablet Take 1 tablet by mouth every evening.  Marland Kitchen BIOTIN 5000  PO Take 1 tablet by mouth daily.  . Calcium Citrate-Vitamin D 200-250 MG-UNIT TABS Take 1 tablet by mouth daily.  . canagliflozin (INVOKANA) 300 MG TABS tablet Take 1 tablet (300 mg total) by mouth daily.  . Cholecalciferol (VITAMIN D3) 2000 units capsule Take 1 capsule by mouth daily.  . cyclobenzaprine (FLEXERIL) 10 MG tablet Take 1 tablet (10 mg total) by mouth at bedtime.  . diclofenac sodium (VOLTAREN) 1 % GEL Place 4 g onto the skin QID.  Marland Kitchen fexofenadine (ALLEGRA) 180 MG tablet Take 1 tablet by mouth daily.  . fluticasone (FLONASE) 50 MCG/ACT nasal spray Place 2 sprays into the nose daily as needed.  . Glucosamine HCl 500 MG TABS Take 1 tablet by mouth daily.  Marland Kitchen levothyroxine (SYNTHROID) 112 MCG tablet Take 1 tablet (112 mcg total) by mouth daily.  . mesalamine (LIALDA) 1.2 g EC tablet Take by mouth. Take 2 tablet in am and 2 tablet in pm  . metFORMIN (GLUCOPHAGE) 1000 MG tablet TAKE 1 TABLET BY MOUTH 2 TIMES DAILY WITH MEALS.  Marland Kitchen Misc Natural Products (TURMERIC CURCUMIN) CAPS Take 1 capsule by mouth daily.  . Multiple Vitamin (MULTIVITAMIN) tablet Take 1 tablet by mouth daily.  . ramipril (ALTACE) 10 MG capsule TAKE 1 CAPSULE BY MOUTH DAILY.    Allergies: Patient is allergic to nickel and sulfa antibiotics. Family History: Patient family history includes Cancer  in her brother and father; Crohn's disease in her brother; Diabetes in her brother, brother, maternal aunt, maternal uncle, and mother; Heart attack in her paternal grandfather; Heart disease in her father; Ulcerative colitis in her brother and mother; Varicose Veins in her sister. Social History:  Patient  reports that  has never smoked. she has never used smokeless tobacco. She reports that she drinks alcohol. She reports that she does not use drugs.  Review of Systems: Constitutional: Negative for fever malaise or anorexia Cardiovascular: negative for chest pain Respiratory: negative for SOB or persistent  cough Gastrointestinal: negative for abdominal pain  Objective  Vitals: BP 138/90   Pulse 70   Temp 98.2 F (36.8 C)   Ht 5' (1.524 m)   Wt 193 lb 6.4 oz (87.7 kg)   BMI 37.77 kg/m  General: no acute distress , A&Ox3 HEENT: PEERL, conjunctiva normal, Oropharynx moist,neck is supple, negative Spurling's Musculoskeletal: Right shoulder with minimal tenderness anteriorly, positive empty can testing, positive impingement sign, full range of motion present, negative drop test Right elbow: Normal exam.  No epicondyles tenderness, no swelling, normal range of motion Right hand: Normal exam, normal strength, negative Phalen's  Assessment  1. Tendinitis of right rotator cuff      Plan   Rotator cuff tendinopathy: Suspect symptoms are all coming from the shoulder.  Recommend ice, rest, range of motion exercises and 2-week course of NSAIDs.  If not improved, return for possible imaging and steroid injection.  Follow up: 2 weeks if not improved.   Commons side effects, risks, benefits, and alternatives for medications and treatment plan prescribed today were discussed, and the patient expressed understanding of the given instructions. Patient is instructed to call or message via MyChart if he/she has any questions or concerns regarding our treatment plan. No barriers to understanding were identified. We discussed Red Flag symptoms and signs in detail. Patient expressed understanding regarding what to do in case of urgent or emergency type symptoms.   Medication list was reconciled, printed and provided to the patient in AVS. Patient instructions and summary information was reviewed with the patient as documented in the AVS. This note was prepared with assistance of Dragon voice recognition software. Occasional wrong-word or sound-a-like substitutions may have occurred due to the inherent limitations of voice recognition software  No orders of the defined types were placed in this  encounter.  Meds ordered this encounter  Medications  . diclofenac (VOLTAREN) 75 MG EC tablet    Sig: Take 1 tablet (75 mg total) by mouth 2 (two) times daily.    Dispense:  30 tablet    Refill:  0

## 2017-04-03 ENCOUNTER — Other Ambulatory Visit: Payer: Self-pay

## 2017-04-03 ENCOUNTER — Encounter: Payer: Self-pay | Admitting: Family Medicine

## 2017-04-03 MED ORDER — ATORVASTATIN CALCIUM 10 MG PO TABS: 10.0000 mg | ORAL_TABLET | Freq: Every evening | ORAL | 3 refills | Status: DC

## 2017-04-08 ENCOUNTER — Other Ambulatory Visit: Payer: Self-pay | Admitting: Family Medicine

## 2017-04-12 ENCOUNTER — Ambulatory Visit (INDEPENDENT_AMBULATORY_CARE_PROVIDER_SITE_OTHER): Payer: Medicare Other

## 2017-04-12 ENCOUNTER — Encounter: Payer: Self-pay | Admitting: Podiatry

## 2017-04-12 ENCOUNTER — Ambulatory Visit (INDEPENDENT_AMBULATORY_CARE_PROVIDER_SITE_OTHER): Payer: Medicare Other | Admitting: Podiatry

## 2017-04-12 DIAGNOSIS — M7750 Other enthesopathy of unspecified foot: Secondary | ICD-10-CM | POA: Diagnosis not present

## 2017-04-12 DIAGNOSIS — M779 Enthesopathy, unspecified: Secondary | ICD-10-CM | POA: Diagnosis not present

## 2017-04-12 DIAGNOSIS — M722 Plantar fascial fibromatosis: Secondary | ICD-10-CM

## 2017-04-12 MED ORDER — DICLOFENAC SODIUM 1 % TD GEL: 2.0000 g | Freq: Four times a day (QID) | TRANSDERMAL | 2 refills | Status: DC

## 2017-04-12 NOTE — Patient Instructions (Signed)

## 2017-04-16 NOTE — Progress Notes (Signed)
Subjective:   Patient ID: Michelle Sherman, female   DOB: 67 y.o.   MRN: [id number]   HPI Maeley presents the office today for concerns of pain of her feet.  She states on the left foot she has pain more to the heel this is mostly after periods of rest when she gets up.  She describes a throbbing pain in the bottom of her heel.  She also has pain to the outside aspect of the right foot and she feels loosely with stretching down the inside of her foot and she points to the fifth metatarsal base where it starts.  This is been ongoing on the right foot for about 6-8 months.  She denies any recent injury or trauma to her feet she denies any change in activity level.  No numbness or tingling.  The pain does not wake up at night.  She has no other concerns today.  She states that her blood sugar is "very good" and she believes her last A1c was 6.5   Review of Systems  All other systems reviewed and are negative.  Past Medical History:  Diagnosis Date  . Allergy   . Diabetes mellitus without complication ([place])   . Hypertension     Past Surgical History:  Procedure Laterality Date  . CHOLECYSTECTOMY    . SKIN SURGERY  12/2015   Fatty tumor removed  . TONSILLECTOMY AND ADENOIDECTOMY  1956     Current Outpatient Medications:  .  aspirin EC 81 MG tablet, Take 1 tablet by mouth daily., Disp: , Rfl:  .  atorvastatin (LIPITOR) 10 MG tablet, Take 1 tablet (10 mg total) by mouth every evening., Disp: 90 tablet, Rfl: 3 .  BIOTIN 5000 PO, Take 1 tablet by mouth daily., Disp: , Rfl:  .  Calcium Citrate-Vitamin D 200-250 MG-UNIT TABS, Take 1 tablet by mouth daily., Disp: , Rfl:  .  canagliflozin (INVOKANA) 300 MG TABS tablet, Take 1 tablet (300 mg total) by mouth daily., Disp: 90 tablet, Rfl: 3 .  Cholecalciferol (VITAMIN D3) 2000 units capsule, Take 1 capsule by mouth daily., Disp: , Rfl:  .  cyclobenzaprine (FLEXERIL) 10 MG tablet, Take 1 tablet (10 mg total) by mouth at bedtime., Disp: 15 tablet,  Rfl: 0 .  diclofenac (VOLTAREN) 75 MG EC tablet, Take 1 tablet (75 mg total) by mouth 2 (two) times daily., Disp: 30 tablet, Rfl: 0 .  diclofenac sodium (VOLTAREN) 1 % GEL, Place 4 g onto the skin QID., Disp: , Rfl:  .  diclofenac sodium (VOLTAREN) 1 % GEL, Apply 2 g topically 4 (four) times daily. Rub into affected area of foot 2 to 4 times daily, Disp: 100 g, Rfl: 2 .  fexofenadine (ALLEGRA) 180 MG tablet, Take 1 tablet by mouth daily., Disp: , Rfl:  .  fluticasone (FLONASE) 50 MCG/ACT nasal spray, Place 2 sprays into the nose daily as needed., Disp: , Rfl:  .  Glucosamine HCl 500 MG TABS, Take 1 tablet by mouth daily., Disp: , Rfl:  .  levothyroxine (SYNTHROID) 112 MCG tablet, Take 1 tablet (112 mcg total) by mouth daily., Disp: 90 tablet, Rfl: 1 .  mesalamine (LIALDA) 1.2 g EC tablet, Take by mouth. Take 2 tablet in am and 2 tablet in pm, Disp: , Rfl:  .  metFORMIN (GLUCOPHAGE) 1000 MG tablet, TAKE ONE TABLET BY MOUTH TWICE A DAY WITH MEALS , Disp: 180 tablet, Rfl: 3 .  Misc Natural Products (TURMERIC CURCUMIN) CAPS, Take 1 capsule by  mouth daily., Disp: , Rfl:  .  Multiple Vitamin (MULTIVITAMIN) tablet, Take 1 tablet by mouth daily., Disp: , Rfl:  .  ramipril (ALTACE) 10 MG capsule, TAKE ONE CAPSULE BY MOUTH ONE TIME DAILY , Disp: 90 capsule, Rfl: 3  Allergies  Allergen Reactions  . Nickel Other (See Comments) and Rash    drainage  . Sulfa Antibiotics Rash    Social History   Socioeconomic History  . Marital status: Married    Spouse name: Not on file  . Number of children: Not on file  . Years of education: Not on file  . Highest education level: Not on file  Occupational History  . Not on file  Social Needs  . Financial resource strain: Not on file  . Food insecurity:    Worry: Not on file    Inability: Not on file  . Transportation needs:    Medical: Not on file    Non-medical: Not on file  Tobacco Use  . Smoking status: Never Smoker  . Smokeless tobacco: Never Used   Substance and Sexual Activity  . Alcohol use: Yes    Comment: occa  . Drug use: No  . Sexual activity: Yes  Lifestyle  . Physical activity:    Days per week: Not on file    Minutes per session: Not on file  . Stress: Not on file  Relationships  . Social connections:    Talks on phone: Not on file    Gets together: Not on file    Attends religious service: Not on file    Active member of club or organization: Not on file    Attends meetings of clubs or organizations: Not on file    Relationship status: Not on file  . Intimate partner violence:    Fear of current or ex partner: Not on file    Emotionally abused: Not on file    Physically abused: Not on file    Forced sexual activity: Not on file  Other Topics Concern  . Not on file  Social History Narrative   Married, cares for disabled husband, no children, no tob, Etoh or drug use; no exercise         Objective:  Physical Exam  General: AAO x3, NAD  Dermatological: Skin is warm, dry and supple bilateral. Nails x 10 are well manicured; remaining integument appears unremarkable at this time. There are no open sores, no preulcerative lesions, no rash or signs of infection present.  Vascular: Dorsalis Pedis artery and Posterior Tibial artery pedal pulses are 2/4 bilateral with immedate capillary fill time. There is no pain with calf compression, swelling, warmth, erythema.   Neruologic: Grossly intact via light touch bilateral. Vibratory intact via tuning fork bilateral. Protective threshold with Semmes Wienstein monofilament intact to all pedal sites bilateral.  Negative Tinel sign.  Musculoskeletal: There is tenderness palpation on the plantar medial tubercle of the calcaneus at the insertion of the plantar fascia on the left foot.  There is no pain on the course the plantar fascial the arch of the foot.  No pain with lateral compression.  On the right side there is mild tenderness palpation of the fifth metatarsal base on  the insertion of the peroneal tendon.  Tendon appears to be intact.  There is no other area of pinpoint bony tenderness or pain to vibratory sensation.  There is no overlying edema, erythema, increase in warmth.  Muscular strength 5/5 in all groups tested bilateral.  Gait: Unassisted,  Nonantalgic.       Assessment:   Left heel pain, plantar fasciitis right peroneal insertional tendinitis    Plan:  -Treatment options discussed including all alternatives, risks, and complications -Etiology of symptoms were discussed -X-rays were obtained and reviewed with the patient.  There is no evidence of acute fracture or stress fracture identified today. -Patient to proceed with a steroid injection.  See procedure note below.  This was to the left foot. -Voltaren gel -Bilateral plantar fascial braces were dispensed -Stretching, icing exercises daily -Discussed shoe modifications and orthotics  Procedure: Injection Tendon/Ligament Discussed alternatives, risks, complications and verbal consent was obtained.  Location: Left plantar fascia at the glabrous junction; medial approach. Skin Prep: Alcohol. Injectate: 1 cc 0.5% marcaine plain, 0.5 cc 0.5% Marcaine plain and, 0.5 cc kenalog 10. Disposition: Patient tolerated procedure well. Injection site dressed with a band-aid.  Post-injection care was discussed and return precautions discussed.    Trula Slade DPM

## 2017-05-03 ENCOUNTER — Encounter: Payer: Self-pay | Admitting: Podiatry

## 2017-05-03 ENCOUNTER — Encounter: Payer: Self-pay | Admitting: Internal Medicine

## 2017-05-03 ENCOUNTER — Ambulatory Visit (INDEPENDENT_AMBULATORY_CARE_PROVIDER_SITE_OTHER): Payer: Medicare Other | Admitting: Podiatry

## 2017-05-03 DIAGNOSIS — M779 Enthesopathy, unspecified: Secondary | ICD-10-CM

## 2017-05-03 DIAGNOSIS — M722 Plantar fascial fibromatosis: Secondary | ICD-10-CM | POA: Diagnosis not present

## 2017-05-06 DIAGNOSIS — M779 Enthesopathy, unspecified: Secondary | ICD-10-CM | POA: Insufficient documentation

## 2017-05-06 DIAGNOSIS — M722 Plantar fascial fibromatosis: Secondary | ICD-10-CM | POA: Insufficient documentation

## 2017-05-06 NOTE — Progress Notes (Signed)
Subjective: 67 year old female presents the office today for follow-up evaluation of tendinitis, plantar fasciitis.  She states that the injection helped quite a bit to about 80% improved.  Also the braces helped quite a bit.  She still gets some minimal discomfort but overall she is doing much better.  She has been doing the stretching icing exercises daily. Denies any systemic complaints such as fevers, chills, nausea, vomiting. No acute changes since last appointment, and no other complaints at this time.   Objective: AAO x3, NAD DP/PT pulses palpable bilaterally, CRT less than 3 seconds At this time there is very minimal tenderness palpation of the plantar medial tubercle of the calcaneus at the insertion of plantar fascia on the left foot.  No pain on the course the plantar fascia.  Achilles tendon intact without any pain.  Also there is no tenderness palpation of the right side along the insertion of the peroneal tendon on the fifth metatarsal base.  No other area tenderness identified bilaterally and there is no overlying edema, erythema, increase in warmth. No open lesions or pre-ulcerative lesions.  No pain with calf compression, swelling, warmth, erythema  Assessment: Resolving tendinitis, plantar fasciitis  Plan: -All treatment options discussed with the patient including all alternatives, risks, complications.  -Overall she is doing much better.  She has very minimal discomfort she wants to hold off on steroid injection today.  Continue stretching, icing exercises daily.  Discussed shoe modifications and we also discussed orthotics.  She can start with an over-the-counter orthotic however we discussed some more custom should she continue to have pain. -Patient encouraged to call the office with any questions, concerns, change in symptoms.   Trula Slade DPM

## 2017-05-07 ENCOUNTER — Encounter: Payer: Self-pay | Admitting: Internal Medicine

## 2017-05-07 ENCOUNTER — Ambulatory Visit (INDEPENDENT_AMBULATORY_CARE_PROVIDER_SITE_OTHER): Payer: Medicare Other | Admitting: Internal Medicine

## 2017-05-07 VITALS — BP 130/64 | HR 76 | Ht 60.0 in | Wt 194.2 lb

## 2017-05-07 DIAGNOSIS — Z9989 Dependence on other enabling machines and devices: Secondary | ICD-10-CM | POA: Diagnosis not present

## 2017-05-07 DIAGNOSIS — G4733 Obstructive sleep apnea (adult) (pediatric): Secondary | ICD-10-CM | POA: Diagnosis not present

## 2017-05-07 DIAGNOSIS — Z6835 Body mass index (BMI) 35.0-35.9, adult: Secondary | ICD-10-CM | POA: Diagnosis not present

## 2017-05-07 NOTE — Progress Notes (Signed)
01/29/17-67 year old female never smoker for sleep evaluation. Medical problem list includes hypothyroid, DM 2, hyperlipidemia, allergic rhinitis, HBP, ulcerative colitis, morbid obesity, NPSG 06/04/06- AHI ? 23/ hr, desaturation to 66%, CPAP titrated to 8, body weight 242 lbs. Sleep Consult; Dr. Jonni Sanger at [facility] uses CPAP now-machine is about 68 years old-CPAP is worn out and needs new machine. Sleep Study attached.  Epworth score 2 She sleeps every night with CPAP and has been uncomfortable on the rare occasion when she slept without it.  Old machine is now giving her a message that "it has exceeded its motor life". ENT surgery-tonsils.  She denies active heart or lung disease.  Reports losing about 50 pounds since her original sleep study.  05/07/17-67 year old female never smoker followed for OSA complicated by hypothyroid, DM 2, hyperlipidemia, allergic rhinitis, HBP, ulcerative colitis, morbid obesity HST 02/05/17-AHI 23.4/hour, desaturation to 86%, body weight 192 pounds CPAP auto 5-12/[facility]>> today 5-15 ----OSA: DME [facility]. Pt wears CPAP nightly; DL attached Pt states she had a heated hose previously and has to shake water out of her tubing-will talk with [facility].  Download 100% compliance AHI 1.0/hour New/replacement machine.  Cannot sleep without CPAP.  Humidity is condensing in hose but she is bothered by dry mouth. She denies other active health concerns for this visit.  Marland KitchenROS-see HPI   + = positive Constitutional:    weight loss, night sweats, fevers, chills, fatigue, lassitude. HEENT:    headaches, difficulty swallowing, tooth/dental problems, sore throat,       sneezing, itching, ear ache, nasal congestion, post nasal drip, snoring CV:    chest pain, orthopnea, PND, swelling in lower extremities, anasarca,                                  dizziness, palpitations Resp:   shortness of breath with exertion or at rest.                productive cough,   non-productive cough, coughing  up of blood.              change in color of mucus.  wheezing.   Skin:    rash or lesions. GI:  No-   heartburn, indigestion, abdominal pain, nausea, vomiting, diarrhea,                 change in bowel habits, loss of appetite GU: dysuria, change in color of urine, no urgency or frequency.   flank pain. MS:   joint pain, +stiffness, decreased range of motion, back pain. Neuro-     nothing unusual Psych:  change in mood or affect.  depression or anxiety.   memory loss.  OBJ- Physical Exam General- Alert, Oriented, Affect-appropriate, Distress- none acute, + overweight Skin- rash-none, lesions- none, excoriation- none Lymphadenopathy- none Head- atraumatic            Eyes- Gross vision intact, PERRLA, conjunctivae and secretions clear            Ears- Hearing, canals-normal            Nose- Clear, no-Septal dev, mucus, polyps, erosion, perforation             Throat- Mallampati II-III , mucosa clear , drainage- none, tonsils- atrophic Neck- flexible , trachea midline, no stridor , thyroid nl, carotid no bruit Chest - symmetrical excursion , unlabored           Heart/CV- RRR ,  no murmur , no gallop  , no rub, nl s1 s2                           - JVD- none , edema- none, stasis changes- none, varices- none           Lung-    clear, wheeze- none, cough- none , dullness-none, rub- none           Chest wall-  Abd-  Br/ Gen/ Rectal- Not done, not indicated Extrem- cyanosis- none, clubbing, none, atrophy- none, strength- nl Neuro- grossly intact to observation

## 2017-05-07 NOTE — Patient Instructions (Signed)
Order-DME [facility] please change auto range to 5-15, help patient with humidification, continue mask of choice, supplies, AirView   Consider trying Biotene mouth rinse at bedtime  Please call if we can help

## 2017-05-17 DIAGNOSIS — K5190 Ulcerative colitis, unspecified, without complications: Secondary | ICD-10-CM | POA: Diagnosis not present

## 2017-05-17 DIAGNOSIS — K5730 Diverticulosis of large intestine without perforation or abscess without bleeding: Secondary | ICD-10-CM | POA: Diagnosis not present

## 2017-05-17 NOTE — Assessment & Plan Note (Signed)
She continues to benefit from CPAP with better sleep and cannot sleep without her machine.  Pressure settings are comfortable.  Download confirms excellent compliance. Plan-increased pressure range to 5-15.  She will work with a DME about comfortable humidity and try Biotene.

## 2017-05-17 NOTE — Assessment & Plan Note (Signed)
She understands she has to make lifestyle changes to accomplish significant weight loss.  Consider bariatric referral for counseling.

## 2017-05-23 ENCOUNTER — Other Ambulatory Visit: Payer: Self-pay | Admitting: Family Medicine

## 2017-05-23 DIAGNOSIS — Z139 Encounter for screening, unspecified: Secondary | ICD-10-CM

## 2017-06-19 ENCOUNTER — Other Ambulatory Visit: Payer: Self-pay

## 2017-06-19 ENCOUNTER — Ambulatory Visit (INDEPENDENT_AMBULATORY_CARE_PROVIDER_SITE_OTHER): Payer: Medicare Other | Admitting: Family Medicine

## 2017-06-19 ENCOUNTER — Encounter: Payer: Self-pay | Admitting: Family Medicine

## 2017-06-19 VITALS — BP 138/80 | HR 75 | Temp 98.1°F | Ht 60.0 in | Wt 193.6 lb

## 2017-06-19 DIAGNOSIS — Z6835 Body mass index (BMI) 35.0-35.9, adult: Secondary | ICD-10-CM | POA: Diagnosis not present

## 2017-06-19 DIAGNOSIS — E1159 Type 2 diabetes mellitus with other circulatory complications: Secondary | ICD-10-CM

## 2017-06-19 DIAGNOSIS — I10 Essential (primary) hypertension: Secondary | ICD-10-CM

## 2017-06-19 DIAGNOSIS — I152 Hypertension secondary to endocrine disorders: Secondary | ICD-10-CM

## 2017-06-19 DIAGNOSIS — E119 Type 2 diabetes mellitus without complications: Secondary | ICD-10-CM | POA: Diagnosis not present

## 2017-06-19 LAB — POCT GLYCOSYLATED HEMOGLOBIN (HGB A1C): Hemoglobin A1C: 6 % — AB (ref 4.0–5.6)

## 2017-06-19 NOTE — Progress Notes (Signed)
Subjective  CC:  Chief Complaint  Patient presents with  . Diabetes    doing well. last A1C was 10/2016  . Hypertension  . Joint Pain    patient states that joints are tight in the morning and get better as day progresses   . Hypothyroidism    HPI: Michelle Sherman is a 67 y.o. female who presents to the office today for follow up of diabetes and problems listed above in the chief complaint.   Diabetes follow up: Her diabetic control is reported as well controlled.. Eating well. Some exercise. No lows.  She denies exertional CP or SOB or symptomatic hypoglycemia. She denies foot sores or paresthesias.   HTN: tolerating meds w/o problems. No sxs of CAD or CHF  OA and foot pain: resolving tendonitis and PF treated by podiatry. Doing better overall. Has aches and pain but no hot red swollen joints or new injuryies.   Assessment  1. Controlled type 2 diabetes mellitus without complication, without long-term current use of insulin ([place])   2. Hypertension associated with diabetes ([place])   3. Severe obesity (BMI 35.0-35.9 with comorbidity) ([place])      Plan   Diabetes is currently very well controlled. No changes.   HTN: well controlled. No changes.   OA: otc meds as needed.   Discussed exercise and strength training.   Follow up: No follow-ups on file.. Orders Placed This Encounter  Procedures  . POCT glycosylated hemoglobin (Hb A1C)   No orders of the defined types were placed in this encounter.     Immunization History  Administered Date(s) Administered  . Influenza, High Dose Seasonal PF 12/06/2015, 11/01/2016  . Pneumococcal Conjugate-13 10/22/2014  . Pneumococcal Polysaccharide-23 10/05/2011, 12/21/2016  . Tdap 10/04/2010  . Zoster 10/03/2009    Diabetes Related Lab Review: Lab Results  Component Value Date   HGBA1C 6.0 (A) 06/19/2017   HGBA1C 6.5 11/01/2016   HGBA1C 6.2 06/14/2016    Lab Results  Component Value Date   MICROALBUR 0.9 12/21/2016   Lab  Results  Component Value Date   CREATININE 0.64 11/01/2016   BUN 14 11/01/2016   NA 139 11/01/2016   K 4.1 11/01/2016   CL 101 11/01/2016   CO2 31 11/01/2016   Lab Results  Component Value Date   CHOL 144 11/01/2016   Lab Results  Component Value Date   HDL 65.50 11/01/2016   Lab Results  Component Value Date   LDLCALC 47 11/01/2016   Lab Results  Component Value Date   TRIG 158.0 (H) 11/01/2016   Lab Results  Component Value Date   CHOLHDL 2 11/01/2016   No results found for: LDLDIRECT The 10-year ASCVD risk score Mikey Bussing DC Jr., et al., 2013) is: 16.2%   Values used to calculate the score:     Age: 89 years     Sex: Female     Is Non-Hispanic African American: No     Diabetic: Yes     Tobacco smoker: No     Systolic Blood Pressure: 323 mmHg     Is BP treated: Yes     HDL Cholesterol: 65.5 mg/dL     Total Cholesterol: 144 mg/dL I have reviewed the [place], Fam and Soc history. Patient Active Problem List   Diagnosis Date Noted  . Controlled type 2 diabetes mellitus without complication, without long-term current use of insulin ([place]) 11/01/2016    Priority: High    Overview:  Declined nutrition consult - due to  lack of insurance coverage for this service 01/2013   . Hypertension associated with diabetes ([place]) 11/01/2016    Priority: High  . Severe obesity (BMI 35.0-35.9 with comorbidity) ([place]) 11/01/2016    Priority: High  . Combined hyperlipidemia associated with type 2 diabetes mellitus ([place]) 01/15/2012    Priority: High    Overview:  Goal LDL < 100   . Ulcerative colitis without complications ([place]) 97/98/9211    Priority: High    Overview:  No NSAIDS   . Acquired hypothyroidism 10/04/2010    Priority: High  . OSA on CPAP 06/28/2010    Priority: High    NPSG 06/04/06- AHI ? 23/ hr, desaturation to 66%, CPAP titrated to 8, body weight 242 lbs.   . Diverticulosis of colon 10/17/2010    Priority: Medium  . Osteoarthrosis of knee 10/04/2010    Priority:  Medium  . Allergic rhinitis 10/05/2011    Priority: Low  . Internal hemorrhoids 10/17/2010    Priority: Low  . Plantar fasciitis 05/06/2017  . Tendonitis 05/06/2017  . Mass of skin of shoulder, right 12/27/2015    Overview:  Added automatically from request for surgery [id number]     Social History: Patient  reports that she has never smoked. She has never used smokeless tobacco. She reports that she drinks alcohol. She reports that she does not use drugs.  Review of Systems: Ophthalmic: negative for eye pain, loss of vision or double vision Cardiovascular: negative for chest pain Respiratory: negative for SOB or persistent cough Gastrointestinal: negative for abdominal pain Genitourinary: negative for dysuria or gross hematuria MSK: negative for foot lesions Neurologic: negative for weakness or gait disturbance  Objective  Vitals: BP 138/80   Pulse 75   Temp 98.1 F (36.7 C)   Ht 5' (1.524 m)   Wt 193 lb 9.6 oz (87.8 kg)   BMI 37.81 kg/m  General: well appearing, no acute distress  Psych:  Alert and oriented, normal mood and affect HEENT:  Normocephalic, atraumatic, moist mucous membranes, supple neck  Cardiovascular:  Nl S1 and S2, RRR without murmur, gallop or rub. no edema Respiratory:  Good breath sounds bilaterally, CTAB with normal effort, no rales Foot exam: no erythema, pallor, or cyanosis visible nl proprioception and sensation to monofilament testing bilaterally, +2 distal pulses bilaterally    Diabetic education: ongoing education regarding chronic disease management for diabetes was given today. We continue to reinforce the ABC's of diabetic management: A1c (<7 or 8 dependent upon patient), tight blood pressure control, and cholesterol management with goal LDL < 100 minimally. We discuss diet strategies, exercise recommendations, medication options and possible side effects. At each visit, we review recommended immunizations and preventive care recommendations for  diabetics and stress that good diabetic control can prevent other problems. See below for this patient's data.    Commons side effects, risks, benefits, and alternatives for medications and treatment plan prescribed today were discussed, and the patient expressed understanding of the given instructions. Patient is instructed to call or message via MyChart if he/she has any questions or concerns regarding our treatment plan. No barriers to understanding were identified. We discussed Red Flag symptoms and signs in detail. Patient expressed understanding regarding what to do in case of urgent or emergency type symptoms.   Medication list was reconciled, printed and provided to the patient in AVS. Patient instructions and summary information was reviewed with the patient as documented in the AVS. This note was prepared with assistance of Dragon voice recognition software.  Occasional wrong-word or sound-a-like substitutions may have occurred due to the inherent limitations of voice recognition software

## 2017-06-19 NOTE — Patient Instructions (Addendum)
Please return in 6 months for your annual complete physical; please come fasting.  Your diabetes continues to be well controlled. Good work!  If you have any questions or concerns, please don't hesitate to send me a message via MyChart or call the office at [phone]. Thank you for visiting with [place] today! It's our pleasure caring for you.  Start light strength training with light weights or body weights.

## 2017-07-01 ENCOUNTER — Encounter: Payer: Self-pay | Admitting: Family Medicine

## 2017-07-02 MED ORDER — LEVOTHYROXINE SODIUM 112 MCG PO TABS: 112.0000 ug | ORAL_TABLET | Freq: Every day | ORAL | 1 refills | Status: DC

## 2017-08-07 ENCOUNTER — Ambulatory Visit
Admission: RE | Admit: 2017-08-07 | Discharge: 2017-08-07 | Disposition: A | Discharge status: Home / Self Care | Payer: Medicare Other | Source: Ambulatory Visit | Attending: Family Medicine | Admitting: Family Medicine

## 2017-08-07 DIAGNOSIS — Z780 Asymptomatic menopausal state: Secondary | ICD-10-CM | POA: Diagnosis not present

## 2017-08-07 DIAGNOSIS — E2839 Other primary ovarian failure: Secondary | ICD-10-CM

## 2017-08-07 DIAGNOSIS — Z139 Encounter for screening, unspecified: Secondary | ICD-10-CM

## 2017-08-07 DIAGNOSIS — Z13820 Encounter for screening for osteoporosis: Secondary | ICD-10-CM | POA: Diagnosis not present

## 2017-08-07 DIAGNOSIS — Z1231 Encounter for screening mammogram for malignant neoplasm of breast: Secondary | ICD-10-CM | POA: Diagnosis not present

## 2017-09-21 ENCOUNTER — Encounter: Payer: Self-pay | Admitting: Family Medicine

## 2017-09-21 ENCOUNTER — Other Ambulatory Visit: Payer: Self-pay

## 2017-09-21 ENCOUNTER — Ambulatory Visit (INDEPENDENT_AMBULATORY_CARE_PROVIDER_SITE_OTHER): Payer: Medicare Other | Admitting: Family Medicine

## 2017-09-21 VITALS — BP 102/70 | HR 75 | Temp 98.0°F | Resp 16 | Ht 60.0 in | Wt 196.0 lb

## 2017-09-21 DIAGNOSIS — M7711 Lateral epicondylitis, right elbow: Secondary | ICD-10-CM

## 2017-09-21 DIAGNOSIS — Z23 Encounter for immunization: Secondary | ICD-10-CM

## 2017-09-21 MED ORDER — TRIAMCINOLONE ACETONIDE 40 MG/ML IJ SUSP
40.0000 mg | Freq: Once | INTRAMUSCULAR | Status: AC
  Administered 2017-09-21: 40 mg via INTRA_ARTICULAR

## 2017-09-21 NOTE — Progress Notes (Signed)
Subjective  CC:  Chief Complaint  Patient presents with  . Arm Pain    Right shoulder. Started 1-2 month ago. Having pain in bicep radiating to wrist.     HPI: Michelle Sherman is a 67 y.o. female who presents to the office today to address the problems listed above in the chief complaint.  C.o right upper arm pain but mostly around elbow; pain with mvts and leaning on elbow. Sore at times to touch w/o redness or swelling or injury. Does a lot of lifting and pulling taking care of her husband. Had rotator cuff steroid shot in march and reports her pain resolved nicely after that. This pain is different.  No hand pain or weakness or numbness in hands.   Assessment  1. Lateral epicondylitis of right elbow   2. Need for influenza vaccination      Plan   Tennis elbow:  Educated on prognosis and tx options. S/p steroid injection. Routine post procedure care instructions were given to patient in detail.  Flu shot updated today.   Follow up: Return if symptoms worsen or fail to improve.   Orders Placed This Encounter  Procedures  . Flu vaccine HIGH DOSE PF   No orders of the defined types were placed in this encounter.     I reviewed the patients updated PMH, FH, and SocHx.    Patient Active Problem List   Diagnosis Date Noted  . Controlled type 2 diabetes mellitus without complication, without long-term current use of insulin ([place]) 11/01/2016    Priority: High  . Hypertension associated with diabetes ([place]) 11/01/2016    Priority: High  . Severe obesity (BMI 35.0-35.9 with comorbidity) ([place]) 11/01/2016    Priority: High  . Combined hyperlipidemia associated with type 2 diabetes mellitus ([place]) 01/15/2012    Priority: High  . Ulcerative colitis without complications ([place]) 33/35/4562    Priority: High  . Acquired hypothyroidism 10/04/2010    Priority: High  . OSA on CPAP 06/28/2010    Priority: High  . Diverticulosis of colon 10/17/2010    Priority: Medium  .  Osteoarthrosis of knee 10/04/2010    Priority: Medium  . Allergic rhinitis 10/05/2011    Priority: Low  . Internal hemorrhoids 10/17/2010    Priority: Low  . Plantar fasciitis 05/06/2017  . Tendonitis 05/06/2017  . Mass of skin of shoulder, right 12/27/2015   Current Meds  Medication Sig  . aspirin EC 81 MG tablet Take 1 tablet by mouth daily.  Marland Kitchen atorvastatin (LIPITOR) 10 MG tablet Take 1 tablet (10 mg total) by mouth every evening.  Marland Kitchen BIOTIN 5000 PO Take 1 tablet by mouth daily.  . Calcium Citrate-Vitamin D 200-250 MG-UNIT TABS Take 1 tablet by mouth daily.  . canagliflozin (INVOKANA) 300 MG TABS tablet Take 1 tablet (300 mg total) by mouth daily.  . Cholecalciferol (VITAMIN D3) 2000 units capsule Take 1 capsule by mouth daily.  . diclofenac sodium (VOLTAREN) 1 % GEL Apply 2 g topically 4 (four) times daily. Rub into affected area of foot 2 to 4 times daily  . fexofenadine (ALLEGRA) 180 MG tablet Take 1 tablet by mouth daily.  . fluticasone (FLONASE) 50 MCG/ACT nasal spray Place 2 sprays into the nose daily as needed.  Marland Kitchen levothyroxine (SYNTHROID) 112 MCG tablet Take 1 tablet (112 mcg total) by mouth daily.  . mesalamine (LIALDA) 1.2 g EC tablet Take by mouth. Take 2 tablet in am and 2 tablet in pm  . metFORMIN (GLUCOPHAGE)  1000 MG tablet TAKE ONE TABLET BY MOUTH TWICE A DAY WITH MEALS   . Misc Natural Products (TURMERIC CURCUMIN) CAPS Take 1 capsule by mouth daily.  . Multiple Vitamin (MULTIVITAMIN) tablet Take 1 tablet by mouth daily.  . ramipril (ALTACE) 10 MG capsule TAKE ONE CAPSULE BY MOUTH ONE TIME DAILY     Allergies: Patient is allergic to nickel and sulfa antibiotics. Family History: Patient family history includes Cancer in her brother and father; Crohn's disease in her brother; Diabetes in her brother, brother, maternal aunt, maternal uncle, and mother; Heart attack in her paternal grandfather; Heart disease in her father; Ulcerative colitis in her brother and mother;  Varicose Veins in her sister. Social History:  Patient  reports that she has never smoked. She has never used smokeless tobacco. She reports that she drinks alcohol. She reports that she does not use drugs.  Review of Systems: Constitutional: Negative for fever malaise or anorexia Cardiovascular: negative for chest pain Respiratory: negative for SOB or persistent cough Gastrointestinal: negative for abdominal pain  Objective  Vitals: BP 102/70   Pulse 75   Temp 98 F (36.7 C) (Oral)   Resp 16   Ht 5' (1.524 m)   Wt 196 lb (88.9 kg)   SpO2 98%   BMI 38.28 kg/m  General: no acute distress , A&Ox3 Right shoulder: nl exam with neg empty can testing and from; right elbow: nl appearing with ttp over lateral epicondyle; pain with resisted dorsiflexion of wrist at elbow. Nl grip  Lateral epicondylitis Steroid Injection Procedure Note  Pre-operative Diagnosis: right lateral epicondylitis  Post-operative Diagnosis: same  Indications: pain and treatment   Anesthesia:cold spray  Procedure Details   Verbal consent was obtained for the procedure. Universal time out done. The point of maximum tenderness was identified and marked. The skin prepped with alcohol and cold spray was used for anesthesia.  A 1" 25ga needle was advanced into the skin over the lateral epicondyle and proximal ligament and  0.25 ml of triamcinolone (KENALOG) 68m/ml with 0.763mof lidocaine 1% was injected.   Complications:  None; patient tolerated the procedure well.     Commons side effects, risks, benefits, and alternatives for medications and treatment plan prescribed today were discussed, and the patient expressed understanding of the given instructions. Patient is instructed to call or message via MyChart if he/she has any questions or concerns regarding our treatment plan. No barriers to understanding were identified. We discussed Red Flag symptoms and signs in detail. Patient expressed understanding  regarding what to do in case of urgent or emergency type symptoms.   Medication list was reconciled, printed and provided to the patient in AVS. Patient instructions and summary information was reviewed with the patient as documented in the AVS. This note was prepared with assistance of Dragon voice recognition software. Occasional wrong-word or sound-a-like substitutions may have occurred due to the inherent limitations of voice recognition software

## 2017-09-21 NOTE — Patient Instructions (Signed)
Please follow up if symptoms do not improve or as needed.    Tennis Elbow Tennis elbow (lateral epicondylitis) is inflammation of the outer tendons of your forearm close to your elbow. Your tendons attach your muscles to your bones. The outer tendons of your forearm are used to extend your wrist, and they attach on the outside part of your elbow. Tennis elbow is often found in people who play tennis, but anyone may get the condition from repeatedly extending the wrist or turning the forearm. What are the causes? This condition is caused by repeatedly extending your wrist and using your hands. It can result from sports or work that requires repetitive forearm movements. Tennis elbow may also be caused by an injury. What increases the risk? You have a higher risk of developing tennis elbow if you play tennis or another racquet sport. You also have a higher risk if you frequently use your hands for work. This condition is also more likely to develop in:  Musicians.  Carpenters, painters, and plumbers.  Cooks.  Cashiers.  People who work in [employer].  Architect workers.  Butchers.  People who use computers.  What are the signs or symptoms? Symptoms of this condition include:  Pain and tenderness in your forearm and the outer part of your elbow. You may only feel the pain when you use your arm, or you may feel it even when you are not using your arm.  A burning feeling that runs from your elbow through your arm.  Weak grip in your hands.  How is this diagnosed? This condition may be diagnosed by medical history and physical exam. You may also have other tests, including:  X-rays.  MRI.  How is this treated? Your health care provider will recommend lifestyle adjustments, such as resting and icing your arm. Treatment may also include:  Medicines for inflammation. This may include shots of cortisone if your pain continues.  Physical therapy. This may include massage or  exercises.  An elbow brace.  Surgery may eventually be recommended if your pain does not go away with treatment. Follow these instructions at home: Activity  Rest your elbow and wrist as directed by your health care provider. Try to avoid any activities that caused the problem until your health care provider says that you can do them again.  If a physical therapist teaches you exercises, do all of them as directed.  If you lift an object, lift it with your palm facing upward. This lowers the stress on your elbow. Lifestyle  If your tennis elbow is caused by sports, check your equipment and make sure that: ? You are using it correctly. ? It is the best fit for you.  If your tennis elbow is caused by work, take breaks frequently, if you are able. Talk with your manager about how to best perform tasks in a way that is safe. ? If your tennis elbow is caused by computer use, talk with your manager about any changes that can be made to your work environment. General instructions  If directed, apply ice to the painful area: ? Put ice in a plastic bag. ? Place a towel between your skin and the bag. ? Leave the ice on for 20 minutes, 2-3 times per day.  Take medicines only as directed by your health care provider.  If you were given a brace, wear it as directed by your health care provider.  Keep all follow-up visits as directed by your health care provider.  This is important. Contact a health care provider if:  Your pain does not get better with treatment.  Your pain gets worse.  You have numbness or weakness in your forearm, hand, or fingers. This information is not intended to replace advice given to you by your health care provider. Make sure you discuss any questions you have with your health care provider. Document Released: 01/09/2005 Document Revised: 09/09/2015 Document Reviewed: 01/05/2014 Elsevier Interactive Patient Education  [employer].

## 2017-09-25 ENCOUNTER — Ambulatory Visit: Payer: Self-pay | Admitting: Family Medicine

## 2017-10-02 ENCOUNTER — Ambulatory Visit: Payer: Medicare Other | Admitting: Psychology

## 2017-10-10 ENCOUNTER — Ambulatory Visit (INDEPENDENT_AMBULATORY_CARE_PROVIDER_SITE_OTHER): Payer: Medicare Other | Admitting: Psychology

## 2017-10-10 DIAGNOSIS — F4323 Adjustment disorder with mixed anxiety and depressed mood: Secondary | ICD-10-CM

## 2017-10-22 ENCOUNTER — Ambulatory Visit (INDEPENDENT_AMBULATORY_CARE_PROVIDER_SITE_OTHER): Payer: Medicare Other | Admitting: Psychology

## 2017-10-22 DIAGNOSIS — F4323 Adjustment disorder with mixed anxiety and depressed mood: Secondary | ICD-10-CM

## 2017-10-31 ENCOUNTER — Ambulatory Visit (INDEPENDENT_AMBULATORY_CARE_PROVIDER_SITE_OTHER): Payer: Medicare Other

## 2017-10-31 ENCOUNTER — Other Ambulatory Visit: Payer: Self-pay

## 2017-10-31 ENCOUNTER — Ambulatory Visit (INDEPENDENT_AMBULATORY_CARE_PROVIDER_SITE_OTHER): Payer: Medicare Other | Admitting: Family Medicine

## 2017-10-31 ENCOUNTER — Other Ambulatory Visit: Payer: Self-pay | Admitting: Family Medicine

## 2017-10-31 ENCOUNTER — Encounter: Payer: Self-pay | Admitting: Family Medicine

## 2017-10-31 VITALS — BP 120/76 | HR 79 | Temp 97.6°F | Ht 60.0 in | Wt 199.4 lb

## 2017-10-31 DIAGNOSIS — M7582 Other shoulder lesions, left shoulder: Secondary | ICD-10-CM

## 2017-10-31 DIAGNOSIS — M25561 Pain in right knee: Secondary | ICD-10-CM

## 2017-10-31 DIAGNOSIS — M25562 Pain in left knee: Secondary | ICD-10-CM | POA: Diagnosis not present

## 2017-10-31 DIAGNOSIS — M7581 Other shoulder lesions, right shoulder: Secondary | ICD-10-CM

## 2017-10-31 DIAGNOSIS — M19011 Primary osteoarthritis, right shoulder: Secondary | ICD-10-CM | POA: Diagnosis not present

## 2017-10-31 DIAGNOSIS — M1712 Unilateral primary osteoarthritis, left knee: Secondary | ICD-10-CM | POA: Diagnosis not present

## 2017-10-31 DIAGNOSIS — M1711 Unilateral primary osteoarthritis, right knee: Secondary | ICD-10-CM | POA: Diagnosis not present

## 2017-10-31 MED ORDER — TRAMADOL HCL 50 MG PO TABS: 50.0000 mg | ORAL_TABLET | Freq: Three times a day (TID) | ORAL | 0 refills | Status: DC | PRN

## 2017-10-31 NOTE — Patient Instructions (Signed)
Please return in 3-4 weeks for recheck.   If you have any questions or concerns, please don't hesitate to send me a message via MyChart or call the office at [phone]. Thank you for visiting with [place] today! It's our pleasure caring for you.   Start doing the leg lifts 3x/ day to strengthen the inner quad muscle.  Start glucosamine twice a day and Tylenol ES twice a day.   Please go to our [facility] office to get your xrays done. You can walk in M-F between 8am and 5pm. Tell them you are there for xrays ordered by me. They will send me the results, then I will let you know the results with instructions.   Address: [place], [place], [place]  (office sits at [address] at [address] intersection; from here, turn left onto [place] [address]), take to [address], turn right and go for a mile or so, office will be on left across form [employer] )  We will call you with information regarding your referral appointment. Physical Therapy.  If you do not hear from [place] within the next 2 weeks, please let me know. It can take 1-2 weeks to get appointments set up with the specialists.    Rotator Cuff Tendinitis Rotator cuff tendinitis is inflammation of the tough, cord-like bands that connect muscle to bone (tendons) in the rotator cuff. The rotator cuff includes all of the muscles and tendons that connect the arm to the shoulder. The rotator cuff holds the head of the upper arm bone (humerus) in the cup (fossa) of the shoulder blade (scapula). This condition can lead to a long-lasting (chronic) tear. The tear may be partial or complete. What are the causes? This condition is usually caused by overusing the rotator cuff. What increases the risk? This condition is more likely to develop in athletes and workers who frequently use their shoulder or reach over their heads. This can include activities such as:  Tennis.  Baseball or  softball.  Swimming.  Construction work.  Painting.  What are the signs or symptoms? Symptoms of this condition include:  Pain spreading (radiating) from the shoulder to the upper arm.  Swelling and tenderness in front of the shoulder.  Pain when reaching, pulling, or lifting the arm above the head.  Pain when lowering the arm from above the head.  Minor pain in the shoulder when resting.  Increased pain in the shoulder at night.  Difficulty placing the arm behind the back.  How is this diagnosed? This condition is diagnosed with a medical history and physical exam. Tests may also be done, including:  X-rays.  MRI.  Ultrasounds.  CT or MR arthrogram. During this test, a contrast material is injected and then images are taken.  How is this treated? Treatment for this condition depends on the severity of the condition. In less severe cases, treatment may include:  Rest. This may be done with a sling that holds the shoulder still (immobilization). Your health care provider may also recommend avoiding activities that involve lifting your arm over your head.  Icing the shoulder.  Anti-inflammatory medicines, such as aspirin or ibuprofen.  In more severe cases, treatment may include:  Physical therapy.  Steroid injections.  Surgery.  Follow these instructions at home: If you have a sling:  Wear the sling as told by your health care provider. Remove it only as told by your health care provider.  Loosen  the sling if your fingers tingle, become numb, or turn cold and blue.  Keep the sling clean.  If the sling is not waterproof, do not let it get wet. Remove it, if allowed, or cover it with a watertight covering when you take a bath or shower. Managing pain, stiffness, and swelling  If directed, put ice on the injured area. ? If you have a removable sling, remove it as told by your health care provider. ? Put ice in a plastic bag. ? Place a towel between your  skin and the bag. ? Leave the ice on for 20 minutes, 2-3 times a day.  Move your fingers often to avoid stiffness and to lessen swelling.  Raise (elevate) the injured area above the level of your heart while you are lying down.  Find a comfortable sleeping position or sleep on a recliner, if available. Driving  Do not drive or use heavy machinery while taking prescription pain medicine.  Ask your health care provider when it is safe to drive if you have a sling on your arm. Activity  Rest your shoulder as told by your health care provider.  Return to your normal activities as told by your health care provider. Ask your health care provider what activities are safe for you.  Do any exercises or stretches as told by your health care provider.  If you do repetitive overhead tasks, take small breaks in between and include stretching exercises as told by your health care provider. General instructions  Do not use any products that contain nicotine or tobacco, such as cigarettes and e-cigarettes. These can delay healing. If you need help quitting, ask your health care provider.  Take over-the-counter and prescription medicines only as told by your health care provider.  Keep all follow-up visits as told by your health care provider. This is important. Contact a health care provider if:  Your pain gets worse.  You have new pain in your arm, hands, or fingers.  Your pain is not relieved with medicine or does not get better after 6 weeks of treatment.  You have cracking sensations when moving your shoulder in certain directions.  You hear a snapping sound after using your shoulder, followed by severe pain and weakness. Get help right away if:  Your arm, hand, or fingers are numb or tingling.  Your arm, hand, or fingers are swollen or painful or they turn white or blue. Summary  Rotator cuff tendinitis is inflammation of the tough, cord-like bands that connect muscle to bone  (tendons) in the rotator cuff.  This condition is usually caused by overusing the rotator cuff, which includes all of the muscles and tendons that connect the arm to the shoulder.  This condition is more likely to develop in athletes and workers who frequently use their shoulder or reach over their heads.  Treatment generally includes rest, anti-inflammatory medicines, and icing. In some cases, physical therapy and steroid injections may be needed. In severe cases, surgery may be needed. This information is not intended to replace advice given to you by your health care provider. Make sure you discuss any questions you have with your health care provider. Document Released: 04/01/2003 Document Revised: 12/27/2015 Document Reviewed: 12/27/2015 Elsevier Interactive Patient Education  2017 [employer] Inc.  Osteoarthritis Osteoarthritis is a type of arthritis that affects tissue that covers the ends of bones in joints (cartilage). Cartilage acts as a cushion between the bones and helps them move smoothly. Osteoarthritis results when cartilage in the  joints gets worn down. Osteoarthritis is sometimes called "wear and tear" arthritis. Osteoarthritis is the most common form of arthritis. It often occurs in older people. It is a condition that gets worse over time (a progressive condition). Joints that are most often affected by this condition are in:  Fingers.  Toes.  Hips.  Knees.  Spine, including neck and lower back.  What are the causes? This condition is caused by age-related wearing down of cartilage that covers the ends of bones. What increases the risk? The following factors may make you more likely to develop this condition:  Older age.  Being overweight or obese.  Overuse of joints, such as in athletes.  Past injury of a joint.  Past surgery on a joint.  Family history of osteoarthritis.  What are the signs or symptoms? The main symptoms of this condition are pain,  swelling, and stiffness in the joint. The joint may lose its shape over time. Small pieces of bone or cartilage may break off and float inside of the joint, which may cause more pain and damage to the joint. Small deposits of bone (osteophytes) may grow on the edges of the joint. Other symptoms may include:  A grating or scraping feeling inside the joint when you move it.  Popping or creaking sounds when you move.  Symptoms may affect one or more joints. Osteoarthritis in a major joint, such as your knee or hip, can make it painful to walk or exercise. If you have osteoarthritis in your hands, you might not be able to grip items, twist your hand, or control small movements of your hands and fingers (fine motor skills). How is this diagnosed? This condition may be diagnosed based on:  Your medical history.  A physical exam.  Your symptoms.  X-rays of the affected joint(s).  Blood tests to rule out other types of arthritis.  How is this treated? There is no cure for this condition, but treatment can help to control pain and improve joint function. Treatment plans may include:  A prescribed exercise program that allows for rest and joint relief. You may work with a physical therapist.  A weight control plan.  Pain relief techniques, such as: ? Applying heat and cold to the joint. ? Electric pulses delivered to nerve endings under the skin (transcutaneous electrical nerve stimulation, or TENS). ? Massage. ? Certain nutritional supplements.  NSAIDs or prescription medicines to help relieve pain.  Medicine to help relieve pain and inflammation (corticosteroids). This can be given by mouth (orally) or as an injection.  Assistive devices, such as a brace, wrap, splint, specialized glove, or cane.  Surgery, such as: ? An osteotomy. This is done to reposition the bones and relieve pain or to remove loose pieces of bone and cartilage. ? Joint replacement surgery. You may need this  surgery if you have very bad (advanced) osteoarthritis.  Follow these instructions at home: Activity  Rest your affected joints as directed by your health care provider.  Do not drive or use heavy machinery while taking prescription pain medicine.  Exercise as directed. Your health care provider or physical therapist may recommend specific types of exercise, such as: ? Strengthening exercises. These are done to strengthen the muscles that support joints that are affected by arthritis. They can be performed with weights or with exercise bands to add resistance. ? Aerobic activities. These are exercises, such as brisk walking or water aerobics, that get your heart pumping. ? Range-of-motion activities. These keep your  joints easy to move. ? Balance and agility exercises. Managing pain, stiffness, and swelling  If directed, apply heat to the affected area as often as told by your health care provider. Use the heat source that your health care provider recommends, such as a moist heat pack or a heating pad. ? If you have a removable assistive device, remove it as told by your health care provider. ? Place a towel between your skin and the heat source. If your health care provider tells you to keep the assistive device on while you apply heat, place a towel between the assistive device and the heat source. ? Leave the heat on for 20-30 minutes. ? Remove the heat if your skin turns bright red. This is especially important if you are unable to feel pain, heat, or cold. You may have a greater risk of getting burned.  If directed, put ice on the affected joint: ? If you have a removable assistive device, remove it as told by your health care provider. ? Put ice in a plastic bag. ? Place a towel between your skin and the bag. If your health care provider tells you to keep the assistive device on during icing, place a towel between the assistive device and the bag. ? Leave the ice on for 20 minutes,  2-3 times a day. General instructions  Take over-the-counter and prescription medicines only as told by your health care provider.  Maintain a healthy weight. Follow instructions from your health care provider for weight control. These may include dietary restrictions.  Do not use any products that contain nicotine or tobacco, such as cigarettes and e-cigarettes. These can delay bone healing. If you need help quitting, ask your health care provider.  Use assistive devices as directed by your health care provider.  Keep all follow-up visits as told by your health care provider. This is important. Where to find more information:  [employer] of Arthritis and Musculoskeletal and Skin Diseases: www.niams.SouthExposed.es  [employer] on Aging: http://kim-miller.com/  American College of Rheumatology: www.rheumatology.org Contact a health care provider if:  Your skin turns red.  You develop a rash.  You have pain that gets worse.  You have a fever along with joint or muscle aches. Get help right away if:  You lose a lot of weight.  You suddenly lose your appetite.  You have night sweats. Summary  Osteoarthritis is a type of arthritis that affects tissue covering the ends of bones in joints (cartilage).  This condition is caused by age-related wearing down of cartilage that covers the ends of bones.  The main symptom of this condition is pain, swelling, and stiffness in the joint.  There is no cure for this condition, but treatment can help to control pain and improve joint function. This information is not intended to replace advice given to you by your health care provider. Make sure you discuss any questions you have with your health care provider. Document Released: 01/09/2005 Document Revised: 09/13/2015 Document Reviewed: 09/13/2015 Elsevier Interactive Patient Education  2018 [employer].  Patellofemoral Pain Syndrome Patellofemoral pain syndrome is a condition that  involves a softening or breakdown of the tissue (cartilage) on the underside of your kneecap (patella). This causes pain in the front of the knee. The condition is also called runner's knee or chondromalacia patella. Patellofemoral pain syndrome is most common in young adults who are active in sports. Your knee is the largest joint in your body. The patella covers the front of  your knee and is attached to muscles above and below your knee. The underside of the patella is covered with a smooth type of cartilage (synovium). The smooth surface helps the patella glide easily when you move your knee. Patellofemoral pain syndrome causes swelling in the joint linings and bone surfaces in your knee. What are the causes? Patellofemoral pain syndrome can be caused by:  Overuse.  Poor alignment of your knee joints.  Weak leg muscles.  A direct blow to your kneecap.  What increases the risk? You may be at risk for patellofemoral pain syndrome if you:  Do a lot of activities that can wear down your kneecap. These include: ? Running. ? Squatting. ? Climbing stairs.  Start a new physical activity or exercise program.  Wear shoes that do not fit well.  Do not have good leg strength.  Are overweight.  What are the signs or symptoms? Knee pain is the most common symptom of patellofemoral pain syndrome. This may feel like a dull, aching pain underneath your patella, in the front of your knee. There may be a popping or cracking sound when you move your knee. Pain may get worse with:  Exercise.  Climbing stairs.  Running.  Jumping.  Squatting.  Kneeling.  Sitting for a long time.  Moving or pushing on your patella.  How is this diagnosed? Your health care provider may be able to diagnose patellofemoral pain syndrome from your symptoms and medical history. You may be asked about your recent physical activities and which ones cause knee pain. Your health care provider may do a physical exam  with certain tests to confirm the diagnosis. These may include:  Moving your patella back and forth.  Checking your range of knee motion.  Having you squat or jump to see if you have pain.  Checking the strength of your leg muscles.  An MRI of the knee may also be done. How is this treated? Patellofemoral pain syndrome can usually be treated at home with rest, ice, compression, and elevation (RICE). Other treatments may include:  Nonsteroidal anti-inflammatory drugs (NSAIDs).  Physical therapy to stretch and strengthen your leg muscles.  Shoe inserts (orthotics) to take stress off your knee.  A knee brace or knee support.  Surgery to remove damaged cartilage or move the patella to a better position. The need for surgery is rare.  Follow these instructions at home:  Take medicines only as directed by your health care provider.  Rest your knee. ? When resting, keep your knee raised above the level of your heart. ? Avoid activities that cause knee pain.  Apply ice to the injured area: ? Put ice in a plastic bag. ? Place a towel between your skin and the bag. ? Leave the ice on for 20 minutes, 2-3 times a day.  Use splints, braces, knee supports, or walking aids as directed by your health care provider.  Perform stretching and strengthening exercises as directed by your health care provider or physical therapist.  Keep all follow-up visits as directed by your health care provider. This is important. Contact a health care provider if:  Your symptoms get worse.  You are not improving with home care. This information is not intended to replace advice given to you by your health care provider. Make sure you discuss any questions you have with your health care provider. Document Released: 12/28/2008 Document Revised: 06/17/2015 Document Reviewed: 03/31/2013 Elsevier Interactive Patient Education  2018 [employer].

## 2017-10-31 NOTE — Progress Notes (Signed)
Subjective  CC:  Chief Complaint  Patient presents with  . Joint Pain    Patient states she has joint pain all over, but Right Knee and shoulder are the worst, x couple of months     HPI: Michelle Sherman is a 67 y.o. female who presents to the office today to address the problems listed above in the chief complaint. Michelle Sherman presents due to pain mainly in right shoulder and right upper arm, right knee bilateral lower legs and left ankle.  She denies red hot swollen joints or trauma.  Pain has been progressive over the last 2 months.  Now interfering with sleep.  Notices pain with right shoulder with certain movements or lifting or pulling.  Right knee feels unstable and is worse after prolonged sitting.  She has not noticed swelling.  Imaging of the left knee 2017 showed medial arthritis.  She denies muscle spasms in the legs.  No weakness.  She did have right rotator cuff tendinopathy status post steroid injection earlier this year.  The shoulder has not been imaged.  She is using extra strength Tylenol with some relief.  She had tramadol left over from a surgery and that helped significantly as well.  She cannot tolerate NSAIDs due to inflammatory bowel disease.  No fevers, chills.  1. Rotator cuff tendonitis, left   2. Bilateral anterior knee pain      Plan   Rotator cuff tendinopathy: Right.  Start with x-rays and physical therapy.  Tylenol and Ultram as needed.  May need MRI or Ortho if cannot get better.  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  Start VMO exercises, ice, Tylenol and Voltaren gel.  Physical therapy.  X-rays.  Recommend stretching for muscle aches.  Of note, recent vitamin D was 50.  Follow up: Return in about 4 weeks (around 11/28/2017) for recheck.   Orders Placed This Encounter  Procedures  . DG Knee AP/LAT W/[place] Right  . DG Shoulder Right  . Ambulatory referral to Physical Therapy   Meds ordered this encounter    Medications  . traMADol (ULTRAM) 50 MG tablet    Sig: Take 1 tablet (50 mg total) by mouth every 8 (eight) hours as needed.    Dispense:  30 tablet    Refill:  0      I reviewed the patients updated PMH, FH, and SocHx.    Patient Active Problem List   Diagnosis Date Noted  . Controlled type 2 diabetes mellitus without complication, without long-term current use of insulin ([place]) 11/01/2016    Priority: High  . Hypertension associated with diabetes ([place]) 11/01/2016    Priority: High  . Severe obesity (BMI 35.0-35.9 with comorbidity) ([place]) 11/01/2016    Priority: High  . Combined hyperlipidemia associated with type 2 diabetes mellitus ([place]) 01/15/2012    Priority: High  . Ulcerative colitis without complications ([place]) 01/75/1025    Priority: High  . Acquired hypothyroidism 10/04/2010    Priority: High  . OSA on CPAP 06/28/2010    Priority: High  . Diverticulosis of colon 10/17/2010    Priority: Medium  . Osteoarthrosis of knee 10/04/2010    Priority: Medium  . Allergic rhinitis 10/05/2011    Priority: Low  . Internal hemorrhoids 10/17/2010    Priority: Low  . Plantar fasciitis 05/06/2017  . Tendonitis 05/06/2017  . Mass of skin of shoulder, right 12/27/2015   Current Meds  Medication Sig  . aspirin EC 81 MG tablet  Take 1 tablet by mouth daily.  Marland Kitchen atorvastatin (LIPITOR) 10 MG tablet Take 1 tablet (10 mg total) by mouth every evening.  Marland Kitchen BIOTIN 5000 PO Take 1 tablet by mouth daily.  . Calcium Citrate-Vitamin D 200-250 MG-UNIT TABS Take 1 tablet by mouth daily.  . canagliflozin (INVOKANA) 300 MG TABS tablet Take 1 tablet (300 mg total) by mouth daily.  . Cholecalciferol (VITAMIN D3) 2000 units capsule Take 1 capsule by mouth daily.  . fexofenadine (ALLEGRA) 180 MG tablet Take 1 tablet by mouth daily.  . fluticasone (FLONASE) 50 MCG/ACT nasal spray Place 2 sprays into the nose daily as needed.  Marland Kitchen levothyroxine (SYNTHROID) 112 MCG tablet Take 1 tablet (112 mcg total) by  mouth daily.  . mesalamine (LIALDA) 1.2 g EC tablet Take by mouth. Take 2 tablet in am and 2 tablet in pm  . metFORMIN (GLUCOPHAGE) 1000 MG tablet TAKE ONE TABLET BY MOUTH TWICE A DAY WITH MEALS   . Misc Natural Products (TURMERIC CURCUMIN) CAPS Take 1 capsule by mouth daily.  . Multiple Vitamin (MULTIVITAMIN) tablet Take 1 tablet by mouth daily.  . ramipril (ALTACE) 10 MG capsule TAKE ONE CAPSULE BY MOUTH ONE TIME DAILY     Allergies: Patient is allergic to nickel and sulfa antibiotics. Family History: Patient family history includes Cancer in her brother and father; Crohn's disease in her brother; Diabetes in her brother, brother, maternal aunt, maternal uncle, and mother; Heart attack in her paternal grandfather; Heart disease in her father; Ulcerative colitis in her brother and mother; Varicose Veins in her sister. Social History:  Patient  reports that she has never smoked. She has never used smokeless tobacco. She reports that she drinks alcohol. She reports that she does not use drugs.  Review of Systems: Constitutional: Negative for fever malaise or anorexia Cardiovascular: negative for chest pain Respiratory: negative for SOB or persistent cough Gastrointestinal: negative for abdominal pain  Objective  Vitals: BP 120/76   Pulse 79   Temp 97.6 F (36.4 C)   Ht 5' (1.524 m)   Wt 199 lb 6.4 oz (90.4 kg)   SpO2 98%   BMI 38.94 kg/m  General: no acute distress , A&Ox3 HEENT: PEERL, conjunctiva normal, Oropharynx moist,neck is supple Muscular skeletal: Right shoulder: Full active and passive range of motion.  Positive empty can testing, pain with internal rotation.  Negative drop test, mild anterior tenderness.  Right lateral epicondyle is not tender. Bilateral knees with crepitus, no effusions, no warmth or redness.  Pain with full extension.  Abnormal patella tracking visualized. No calf tenderness or cords   Commons side effects, risks, benefits, and alternatives for  medications and treatment plan prescribed today were discussed, and the patient expressed understanding of the given instructions. Patient is instructed to call or message via MyChart if he/she has any questions or concerns regarding our treatment plan. No barriers to understanding were identified. We discussed Red Flag symptoms and signs in detail. Patient expressed understanding regarding what to do in case of urgent or emergency type symptoms.   Medication list was reconciled, printed and provided to the patient in AVS. Patient instructions and summary information was reviewed with the patient as documented in the AVS. This note was prepared with assistance of Dragon voice recognition software. Occasional wrong-word or sound-a-like substitutions may have occurred due to the inherent limitations of voice recognition software

## 2017-11-05 ENCOUNTER — Ambulatory Visit (INDEPENDENT_AMBULATORY_CARE_PROVIDER_SITE_OTHER): Payer: Medicare Other | Admitting: Psychology

## 2017-11-05 DIAGNOSIS — F4323 Adjustment disorder with mixed anxiety and depressed mood: Secondary | ICD-10-CM

## 2017-11-20 ENCOUNTER — Ambulatory Visit (INDEPENDENT_AMBULATORY_CARE_PROVIDER_SITE_OTHER): Payer: Medicare Other | Admitting: Psychology

## 2017-11-20 DIAGNOSIS — F4323 Adjustment disorder with mixed anxiety and depressed mood: Secondary | ICD-10-CM

## 2017-11-23 DIAGNOSIS — E119 Type 2 diabetes mellitus without complications: Secondary | ICD-10-CM | POA: Diagnosis not present

## 2017-11-23 DIAGNOSIS — H2513 Age-related nuclear cataract, bilateral: Secondary | ICD-10-CM | POA: Diagnosis not present

## 2017-11-23 DIAGNOSIS — H5213 Myopia, bilateral: Secondary | ICD-10-CM | POA: Diagnosis not present

## 2017-11-24 LAB — HM DIABETES EYE EXAM

## 2017-11-26 ENCOUNTER — Encounter: Payer: Self-pay | Admitting: Emergency Medicine

## 2017-11-27 ENCOUNTER — Ambulatory Visit: Payer: Self-pay | Admitting: Family Medicine

## 2017-11-29 ENCOUNTER — Ambulatory Visit (INDEPENDENT_AMBULATORY_CARE_PROVIDER_SITE_OTHER): Payer: Medicare Other | Admitting: Family Medicine

## 2017-11-29 ENCOUNTER — Other Ambulatory Visit: Payer: Self-pay

## 2017-11-29 ENCOUNTER — Encounter: Payer: Self-pay | Admitting: Family Medicine

## 2017-11-29 VITALS — BP 104/60 | HR 84 | Temp 98.1°F | Ht 60.0 in | Wt 196.0 lb

## 2017-11-29 DIAGNOSIS — M170 Bilateral primary osteoarthritis of knee: Secondary | ICD-10-CM

## 2017-11-29 DIAGNOSIS — M7581 Other shoulder lesions, right shoulder: Secondary | ICD-10-CM

## 2017-11-29 MED ORDER — TRIAMCINOLONE ACETONIDE 40 MG/ML IJ SUSP
40.0000 mg | Freq: Once | INTRAMUSCULAR | Status: AC
  Administered 2017-11-29: 40 mg via INTRA_ARTICULAR

## 2017-11-29 NOTE — Progress Notes (Signed)
Subjective  CC:  Chief Complaint  Patient presents with  . Knee Pain    knees are doing better  . Shoulder Pain    Right Shoulder is still having pain, she states we never contacted her for physical therapy     HPI: Michelle Sherman is a 67 y.o. female who presents to the office today to address the problems listed above in the chief complaint.  Bilateral knee pain follow-up: Patient is much improved from 2 weeks ago.  X-rays were reviewed, right greater than left osteoarthritis.  Started glucosamine twice a day with significant relief.  Now with only occasional pain.  No swelling.  Has not done VMO exercises  Right shoulder pain persists.  Has not yet started PT.  Last steroid injection was in March of this year.  No weakness.  No neck pain.  No radicular pain.  Right shoulder x-ray did show mild osteoarthritis in the joint.  Assessment  1. Primary osteoarthritis of both knees   2. Rotator cuff tendinitis, right      Plan   Osteoarthritis knees: Mild, bilateral, continue glucosamine and as needed Tylenol or Advil.  Recommend VMO strengthening exercises.  If worsens, would be a good Synvisc candidate  Rotator cuff tendinopathy: Status post steroid injection.  Routine postprocedure care instructions given.  PT to be scheduled.  To orthopedics if unimproved.  Follow up: Return for as scheduled.   No orders of the defined types were placed in this encounter.  Meds ordered this encounter  Medications  . triamcinolone acetonide (KENALOG-40) injection 40 mg      I reviewed the patients updated PMH, FH, and SocHx.    Patient Active Problem List   Diagnosis Date Noted  . Controlled type 2 diabetes mellitus without complication, without long-term current use of insulin ([place]) 11/01/2016    Priority: High  . Hypertension associated with diabetes ([place]) 11/01/2016    Priority: High  . Severe obesity (BMI 35.0-35.9 with comorbidity) ([place]) 11/01/2016    Priority: High  . Combined  hyperlipidemia associated with type 2 diabetes mellitus ([place]) 01/15/2012    Priority: High  . Ulcerative colitis without complications ([place]) 44/62/8638    Priority: High  . Acquired hypothyroidism 10/04/2010    Priority: High  . OSA on CPAP 06/28/2010    Priority: High  . Diverticulosis of colon 10/17/2010    Priority: Medium  . Osteoarthrosis of knee 10/04/2010    Priority: Medium  . Allergic rhinitis 10/05/2011    Priority: Low  . Internal hemorrhoids 10/17/2010    Priority: Low  . Plantar fasciitis 05/06/2017  . Tendonitis 05/06/2017  . Mass of skin of shoulder, right 12/27/2015   Current Meds  Medication Sig  . aspirin EC 81 MG tablet Take 1 tablet by mouth daily.  Marland Kitchen atorvastatin (LIPITOR) 10 MG tablet Take 1 tablet (10 mg total) by mouth every evening.  Marland Kitchen BIOTIN 5000 PO Take 1 tablet by mouth daily.  . Calcium Citrate-Vitamin D 200-250 MG-UNIT TABS Take 1 tablet by mouth daily.  . canagliflozin (INVOKANA) 300 MG TABS tablet Take 1 tablet (300 mg total) by mouth daily.  . Cholecalciferol (VITAMIN D3) 2000 units capsule Take 1 capsule by mouth daily.  . diclofenac sodium (VOLTAREN) 1 % GEL Apply 2 g topically 4 (four) times daily. Rub into affected area of foot 2 to 4 times daily  . fexofenadine (ALLEGRA) 180 MG tablet Take 1 tablet by mouth daily.  . fluticasone (FLONASE) 50 MCG/ACT nasal spray  Place 2 sprays into the nose daily as needed.  Marland Kitchen levothyroxine (SYNTHROID) 112 MCG tablet Take 1 tablet (112 mcg total) by mouth daily.  . mesalamine (LIALDA) 1.2 g EC tablet Take by mouth. Take 2 tablet in am and 2 tablet in pm  . metFORMIN (GLUCOPHAGE) 1000 MG tablet TAKE ONE TABLET BY MOUTH TWICE A DAY WITH MEALS   . Misc Natural Products (TURMERIC CURCUMIN) CAPS Take 1 capsule by mouth daily.  . Multiple Vitamin (MULTIVITAMIN) tablet Take 1 tablet by mouth daily.  . ramipril (ALTACE) 10 MG capsule TAKE ONE CAPSULE BY MOUTH ONE TIME DAILY   . traMADol (ULTRAM) 50 MG tablet Take 1  tablet (50 mg total) by mouth every 8 (eight) hours as needed.    Allergies: Patient is allergic to nickel and sulfa antibiotics. Family History: Patient family history includes Cancer in her brother and father; Crohn's disease in her brother; Diabetes in her brother, brother, maternal aunt, maternal uncle, and mother; Heart attack in her paternal grandfather; Heart disease in her father; Ulcerative colitis in her brother and mother; Varicose Veins in her sister. Social History:  Patient  reports that she has never smoked. She has never used smokeless tobacco. She reports that she drinks alcohol. She reports that she does not use drugs.  Review of Systems: Constitutional: Negative for fever malaise or anorexia Cardiovascular: negative for chest pain Respiratory: negative for SOB or persistent cough Gastrointestinal: negative for abdominal pain  Objective  Vitals: BP 104/60   Pulse 84   Temp 98.1 F (36.7 C)   Ht 5' (1.524 m)   Wt 196 lb (88.9 kg)   SpO2 99%   BMI 38.28 kg/m  General: no acute distress , A&Ox3 Right shoulder: Persistent subacromial tenderness with positive empty can testing and impingement sign  Shoulder Injection Procedure Note  Pre-operative Diagnosis: right shoulder  Post-operative Diagnosis: same  Indications: Symptomatic relief and failed conservative measures  Anesthesia: Lidocaine 1% without epinephrine without added sodium bicarbonate  Procedure Details   Verbal consent was obtained for the procedure. The shoulder was prepped with alcohol  and the skin was anesthetized with cold spray. Using a 22 gauge needle the subacromial space was injected with 4 mL 1% lidocaine and 1 mL of triamcinolone (KENALOG) 53m/ml under the posterior aspect of the acromion. The injection site was cleansed with topical isopropyl alcohol and a dressing was applied.  Complications:  None; patient tolerated the procedure well.    Commons side effects, risks, benefits, and  alternatives for medications and treatment plan prescribed today were discussed, and the patient expressed understanding of the given instructions. Patient is instructed to call or message via MyChart if he/she has any questions or concerns regarding our treatment plan. No barriers to understanding were identified. We discussed Red Flag symptoms and signs in detail. Patient expressed understanding regarding what to do in case of urgent or emergency type symptoms.   Medication list was reconciled, printed and provided to the patient in AVS. Patient instructions and summary information was reviewed with the patient as documented in the AVS. This note was prepared with assistance of Dragon voice recognition software. Occasional wrong-word or sound-a-like substitutions may have occurred due to the inherent limitations of voice recognition software

## 2017-11-29 NOTE — Patient Instructions (Addendum)
See you soon for your physical and diabetes recheck.   If you have any questions or concerns, please don't hesitate to send me a message via MyChart or call the office at [phone]. Thank you for visiting with [place] today! It's our pleasure caring for you.  Do your straight leg lifts to build your inner thigh muscles to help prevent knee pain.   Ice you shoulder for a few days after the steroid injection.   You had a steroid injection today.  Things to be aware of after this injection are listed below:  You may experience no significant improvement or even a slight worsening in your symptoms during the first 24 to 48 hours.  After that we expect your symptoms to improve gradually over the next 2 weeks for the medicine to have its maximal effect.  You should continue to have improvement out to 6 weeks after your injection.  I recommend icing the site of the injection for 20 minutes  1-2 times the day of your injection  You may shower but no swimming, tub bath or Jacuzzi for 24 hours.  If your bandage falls off this does not need to be replaced.  It is appropriate to remove the bandage after 4 hours.  You may resume light activities as tolerated unless otherwise directed per Dr. Paulla Fore during your visit     POSSIBLE PROCEDURE SIDE EFFECTS: The side effects of the injection are usually fairly minimal however if you may experience some of the following side effects that are usually self-limited and will is off on their own.  If you are concerned please feel free to call the office with questions:             Increased numbness or tingling             Nausea or vomiting             Swelling or bruising at the injection site    Please call our office if if you experience any of the following symptoms over the next week as these can be signs of infection:              Fever greater than 100.16F             Significant swelling at the injection site             Significant redness or drainage  from the injection site   If after 2 weeks you are continuing to have worsening symptoms please call our office to discuss what the next appropriate actions should be including the potential for a return office visit or other diagnostic testing.

## 2017-12-03 ENCOUNTER — Ambulatory Visit (INDEPENDENT_AMBULATORY_CARE_PROVIDER_SITE_OTHER): Payer: Medicare Other | Admitting: Psychology

## 2017-12-03 DIAGNOSIS — F4323 Adjustment disorder with mixed anxiety and depressed mood: Secondary | ICD-10-CM

## 2017-12-04 ENCOUNTER — Ambulatory Visit (INDEPENDENT_AMBULATORY_CARE_PROVIDER_SITE_OTHER): Payer: Medicare Other | Admitting: Physical Therapy

## 2017-12-04 DIAGNOSIS — G8929 Other chronic pain: Secondary | ICD-10-CM

## 2017-12-04 DIAGNOSIS — M25562 Pain in left knee: Secondary | ICD-10-CM

## 2017-12-04 DIAGNOSIS — M25511 Pain in right shoulder: Secondary | ICD-10-CM | POA: Diagnosis not present

## 2017-12-04 DIAGNOSIS — M25561 Pain in right knee: Secondary | ICD-10-CM | POA: Diagnosis not present

## 2017-12-04 NOTE — Therapy (Signed)
[facility] [address] [place], [place], [address] Phone: [phone]   Fax:  [phone]  Physical Therapy Evaluation  Patient Details  Name: Michelle Sherman MRN: [id number] Date of Birth: 06/27/1950 Referring Provider (PT): Billey Chang   Encounter Date: 12/04/2017  PT End of Session - 12/04/17 1319    Visit Number  1    Number of Visits  12    Date for PT Re-Evaluation  01/15/18    Authorization Type  Medicare    PT Start Time  1103    PT Stop Time  1147    PT Time Calculation (min)  44 min    Activity Tolerance  Patient tolerated treatment well    Behavior During Therapy  [facility] for tasks assessed/performed       Past Medical History:  Diagnosis Date  . Allergy   . Diabetes mellitus without complication ([place])   . Hypertension   . Thyroid disease     Past Surgical History:  Procedure Laterality Date  . CHOLECYSTECTOMY    . SKIN SURGERY  12/2015   Fatty tumor removed  . TONSILLECTOMY AND ADENOIDECTOMY  1956    There were no vitals filed for this visit.   Subjective Assessment - 12/04/17 1107    Subjective  Pt has R shoulder pain, and Bil knee pain. She had recent injection in R shoulder (2nd injection). Pt has had pain in Shoulders for quite some, her husband is double amputee, and she has to help lift him at times. She states most pain with computer work, as well as at night. Knee pain has been present for years, but has increased recently, no incident to report. She started taking glucosamine recently, which she feels is helping quite a bit. She has increased pain with standing, walking. R feels like it "wants to collapse".  Pt does not work, but is busy at home.     Limitations  Lifting;Standing;Walking;House hold activities;Reading    Patient Stated Goals  Decreased pain     Currently in Pain?  Yes    Pain Score  4     Pain Location  Knee    Pain Orientation  Right;Left    Pain Descriptors / Indicators  Aching     Pain Type  Chronic pain    Pain Onset  More than a month ago    Pain Frequency  Intermittent    Aggravating Factors   standing, walking , stairs    Multiple Pain Sites  Yes    Pain Score  7    Pain Location  Shoulder    Pain Orientation  Right    Pain Descriptors / Indicators  Aching    Pain Type  Chronic pain    Pain Onset  More than a month ago    Pain Frequency  Intermittent    Aggravating Factors   computer work, sleeping          [facility] PT Assessment - 12/04/17 0001      Assessment   Medical Diagnosis  R shoulder pain/  Bil Knee pain    Referring Provider (PT)  Billey Chang    Hand Dominance  Right    Prior Therapy  none      Balance Screen   Has the patient fallen in the past 6 months  No      Prior Function   Level of Independence  Independent      Cognition   Overall Cognitive Status  Within Functional Limits for tasks assessed      Posture/Postural Control   Posture Comments  rounded shoulders, forward head/  flexible      ROM / Strength   AROM / PROM / Strength  AROM;Strength      AROM   Overall AROM Comments  Knee: L: WFL;  R : mild limitation for end range flexion;  R Shoulder: WNL    AROM Assessment Site  Shoulder    Right/Left Shoulder  Right      Strength   Strength Assessment Site  Hip;Knee;Shoulder    Right/Left Shoulder  Right    Right Shoulder Flexion  4-/5    Right Shoulder ABduction  4-/5    Right Shoulder Internal Rotation  4-/5    Right Shoulder External Rotation  4-/5    Right/Left Hip  Right;Left    Right Hip Flexion  4/5    Right Hip ABduction  4/5    Left Hip Flexion  4/5    Left Hip ABduction  4/5    Right/Left Knee  Right;Left    Right Knee Flexion  4/5    Right Knee Extension  4/5    Left Knee Flexion  4/5      Palpation   Palpation comment  Knee: pain at medial patella line      Special Tests   Other special tests  Knee: Increased laxity on R with anterior drawer vs L;    R shoulder: Painful speeds, empty can, all  resisted elevation painful;                 Objective measurements completed on examination: See above findings.      [facility] Adult PT Treatment/Exercise - 12/04/17 0001      Exercises   Exercises  Knee/Hip      Knee/Hip Exercises: Seated   Long Arc Quad  10 reps;Both      Knee/Hip Exercises: Supine   Quad Sets  10 reps;Both    Straight Leg Raises  10 reps;Both      Knee/Hip Exercises: Sidelying   Hip ABduction  10 reps;Both             PT Education - 12/04/17 1319    Education Details  HEP, PT POC     Person(s) Educated  Patient    Methods  Explanation;Handout;Verbal cues;Demonstration    Comprehension  Verbalized understanding;Need further instruction       PT Short Term Goals - 12/04/17 1324      PT SHORT TERM GOAL #1   Title  Pt to be independent with initial HEP for shoulder and knees.     Time  2    Period  Weeks    Status  New    Target Date  12/18/17      PT SHORT TERM GOAL #2   Title  Pt to report decreased pain in R shoulder, to 3/10 with reading and computer work.     Time  2    Period  Weeks    Status  New    Target Date  12/18/17        PT Long Term Goals - 12/04/17 1325      PT LONG TERM GOAL #1   Title  Pt to independently demonstrate proper seated posture for shoulder, neck and back, to improve pain.     Time  6    Period  Weeks    Status  New    Target Date  01/15/18      PT LONG TERM GOAL #2   Title  Pt to demo increased strength of Bil hips and knees to at least 4+/5 to improve stability and pain.     Time  6    Period  Weeks    Status  New    Target Date  01/15/18      PT LONG TERM GOAL #3   Title  Pt to demo improved strength of R shoulder, to at least 4+/5 to improve ability for reaching, lifting, and IADLS.     Time  6    Period  Weeks    Status  New    Target Date  01/15/18      PT LONG TERM GOAL #4   Title  Pt to report decreased pain in R shoulder, to 0-2/10 with activity, to improve ability for IADLs.        PT LONG TERM GOAL #5   Title  Pt to be independent with final HEP for knees and shoulder.     Time  6    Period  Weeks    Status  New    Target Date  01/15/18             Plan - 12/04/17 1320    Clinical Impression Statement  Pt presents with primary complaint of increased pain in bil knees and R shoulder. R shoulder will be primary focus, and knees will be secondary. Pt with mild pain in Bil knees R>L, consistent with OA, with mild joint stiffness, and mild weakness in quad and hips. Pt with decreased ability for standing, walking, and exercise, due to pain. Pt also with Pain in R shoulder, consistent with overuse and tendinitis. She has good ROM, but decreased strength and stability. She has poor movement mechanics, and poor seated and standing posture. Pt to benefit from education on posture for functional tasks and caring for her husband. Pt to benefit from skilled PT, to decreased pain and improve functional mobility.     Clinical Presentation  Stable    Clinical Decision Making  Low    Rehab Potential  Good    PT Frequency  2x / week    PT Duration  6 weeks    PT Treatment/Interventions  ADLs/Self Care Home Management;Cryotherapy;Electrical Stimulation;Iontophoresis 45m/ml Dexamethasone;Moist Heat;Therapeutic activities;Functional mobility training;Stair training;Gait training;Ultrasound;Therapeutic exercise;Balance training;Neuromuscular re-education;Patient/family education;Dry needling;Passive range of motion;Manual techniques;Taping;Spinal Manipulations;Joint Manipulations    PT Next Visit Plan  HEp for shoulder.     Consulted and Agree with Plan of Care  Patient       Patient will benefit from skilled therapeutic intervention in order to improve the following deficits and impairments:  Abnormal gait, Hypomobility, Decreased activity tolerance, Decreased strength, Pain, Increased muscle spasms, Difficulty walking, Decreased mobility, Decreased balance, Decreased range of  motion, Improper body mechanics, Impaired flexibility  Visit Diagnosis: Chronic right shoulder pain  Chronic pain of right knee  Chronic pain of left knee     Problem List Patient Active Problem List   Diagnosis Date Noted  . Plantar fasciitis 05/06/2017  . Tendonitis 05/06/2017  . Controlled type 2 diabetes mellitus without complication, without long-term current use of insulin ([place] 11/01/2016  . Hypertension associated with diabetes ([place] 11/01/2016  . Severe obesity (BMI 35.0-35.9 with comorbidity) ([place] 11/01/2016  . Mass of skin of shoulder, right 12/27/2015  . Combined hyperlipidemia associated with type 2 diabetes mellitus ([place] 01/15/2012  . Allergic rhinitis 10/05/2011  . Ulcerative colitis without  complications ([place]) 29/03/7953  . Diverticulosis of colon 10/17/2010  . Internal hemorrhoids 10/17/2010  . Acquired hypothyroidism 10/04/2010  . Osteoarthrosis of knee 10/04/2010  . OSA on CPAP 06/28/2010    Lyndee Hensen, PT, DPT 1:43 PM  12/04/17    [facility], [place], [address] Phone: [phone]   Fax:  [phone]  Name: MARCHEL FOOTE MRN: [id number] Date of Birth: April 30, 1950

## 2017-12-04 NOTE — Patient Instructions (Signed)
Access Code: [email or web address]  URL: [email or web address]  Date: 12/04/2017  Prepared by: Lyndee Hensen   Exercises  Long Sitting Quad Set - 10 reps - 2 sets - 2x daily  Straight Leg Raise - 10 reps - 2 sets - 1x daily  Supine Heel Slide with Strap - 10 reps - 2 sets - 1x daily  Sidelying Hip Abduction - 10 reps - 2 sets - 1x daily  Seated Long Arc Quad - 10 reps - 2 sets - 1x daily

## 2017-12-12 ENCOUNTER — Encounter: Payer: Self-pay | Admitting: Physical Therapy

## 2017-12-12 ENCOUNTER — Ambulatory Visit (INDEPENDENT_AMBULATORY_CARE_PROVIDER_SITE_OTHER): Payer: Medicare Other | Admitting: Physical Therapy

## 2017-12-12 DIAGNOSIS — M25561 Pain in right knee: Secondary | ICD-10-CM

## 2017-12-12 DIAGNOSIS — M25562 Pain in left knee: Secondary | ICD-10-CM | POA: Diagnosis not present

## 2017-12-12 DIAGNOSIS — M25511 Pain in right shoulder: Secondary | ICD-10-CM | POA: Diagnosis not present

## 2017-12-12 DIAGNOSIS — G8929 Other chronic pain: Secondary | ICD-10-CM

## 2017-12-12 NOTE — Patient Instructions (Signed)
Access Code: [email or web address]  URL: [email or web address]  Date: 12/12/2017  Prepared by: Lyndee Hensen   Exercises  Added:  Supine Shoulder Flexion Extension AAROM with [place]el - 10 reps - 1 sets - 1x daily  Supine Bilateral Shoulder Protraction - 10 reps - 2 sets - 1x daily  Scapular Retraction with Resistance - 10 reps - 2 sets - 1x daily  Doorway Pec Stretch at 60 Elevation - 3 reps - 30 hold - 2x daily

## 2017-12-12 NOTE — Therapy (Signed)
[facility] [address] [place], [place], [address] Phone: [phone]   Fax:  [phone]  Physical Therapy Treatment  Patient Details  Name: Michelle Sherman MRN: [id number] Date of Birth: January 04, 1951 Referring Provider (PT): Billey Chang   Encounter Date: 12/12/2017  PT End of Session - 12/12/17 1216    Visit Number  2    Number of Visits  12    Date for PT Re-Evaluation  01/15/18    Authorization Type  Medicare    PT Start Time  1212    PT Stop Time  1259    PT Time Calculation (min)  47 min    Activity Tolerance  Patient tolerated treatment well    Behavior During Therapy  [facility] for tasks assessed/performed       Past Medical History:  Diagnosis Date  . Allergy   . Diabetes mellitus without complication ([place])   . Hypertension   . Thyroid disease     Past Surgical History:  Procedure Laterality Date  . CHOLECYSTECTOMY    . SKIN SURGERY  12/2015   Fatty tumor removed  . TONSILLECTOMY AND ADENOIDECTOMY  1956    There were no vitals filed for this visit.  Subjective Assessment - 12/12/17 1214    Subjective  Pt states that her shoulder is not very painful today. SHe has tried to do HEP, but was not able to to do as much as recommended.     Currently in Pain?  Yes    Pain Score  2     Pain Location  Knee    Pain Orientation  Right;Left    Pain Descriptors / Indicators  Aching    Pain Type  Chronic pain    Pain Onset  More than a month ago    Pain Frequency  Intermittent    Pain Score  0    Pain Location  Shoulder                       [facility] Adult PT Treatment/Exercise - 12/12/17 1210      Posture/Postural Control   Posture Comments  rounded shoulders, forward head/  flexible      Exercises   Exercises  Knee/Hip;Shoulder      Knee/Hip Exercises: Aerobic   Stationary Bike  L1 x 6 min      Knee/Hip Exercises: Seated   Long Arc Quad  Both;20 reps    Long Arc Quad Weight  2 lbs.       Knee/Hip Exercises: Supine   Quad Sets  --    Straight Leg Raises  Both;10 reps;2 sets      Knee/Hip Exercises: Sidelying   Hip ABduction  10 reps;Both;2 sets      Shoulder Exercises: Supine   Protraction  20 reps    Protraction Weight (lbs)  2    Flexion  AAROM;20 reps    Flexion Limitations  x10: cane;  x10 1 lb weight      Shoulder Exercises: Sidelying   External Rotation  20 reps;Both      Shoulder Exercises: Standing   Row  20 reps    Theraband Level (Shoulder Row)  Level 2 (Red)      Shoulder Exercises: Pulleys   Flexion  2 minutes      Shoulder Exercises: Stretch   Corner Stretch  3 reps;30 seconds    Corner Stretch Limitations  45 and 90 deg  PT Education - 12/12/17 1251    Education Details  HEP updated, added shoulder ther ex.     Person(s) Educated  Patient    Methods  Explanation;Handout;Demonstration;Verbal cues;Tactile cues    Comprehension  Verbalized understanding;Need further instruction       PT Short Term Goals - 12/04/17 1324      PT SHORT TERM GOAL #1   Title  Pt to be independent with initial HEP for shoulder and knees.     Time  2    Period  Weeks    Status  New    Target Date  12/18/17      PT SHORT TERM GOAL #2   Title  Pt to report decreased pain in R shoulder, to 3/10 with reading and computer work.     Time  2    Period  Weeks    Status  New    Target Date  12/18/17        PT Long Term Goals - 12/04/17 1325      PT LONG TERM GOAL #1   Title  Pt to independently demonstrate proper seated posture for shoulder, neck and back, to improve pain.     Time  6    Period  Weeks    Status  New    Target Date  01/15/18      PT LONG TERM GOAL #2   Title  Pt to demo increased strength of Bil hips and knees to at least 4+/5 to improve stability and pain.     Time  6    Period  Weeks    Status  New    Target Date  01/15/18      PT LONG TERM GOAL #3   Title  Pt to demo improved strength of R shoulder, to at least  4+/5 to improve ability for reaching, lifting, and IADLS.     Time  6    Period  Weeks    Status  New    Target Date  01/15/18      PT LONG TERM GOAL #4   Title  Pt to report decreased pain in R shoulder, to 0-2/10 with activity, to improve ability for IADLs.       PT LONG TERM GOAL #5   Title  Pt to be independent with final HEP for knees and shoulder.     Time  6    Period  Weeks    Status  New    Target Date  01/15/18            Plan - 12/12/17 1256    Clinical Impression Statement  Reviewed LE ther ex today. Educated on ther ex and HEP for shoulder. Pt cued for shoulder posture and mechanics with exercises, to decrease forward shoulder position. Pt with mild soreness in anterior shoulder/bicep region with ther ex today. Plan to progress as tolerated.     Rehab Potential  Good    PT Frequency  2x / week    PT Duration  6 weeks    PT Treatment/Interventions  ADLs/Self Care Home Management;Cryotherapy;Electrical Stimulation;Iontophoresis 45m/ml Dexamethasone;Moist Heat;Therapeutic activities;Functional mobility training;Stair training;Gait training;Ultrasound;Therapeutic exercise;Balance training;Neuromuscular re-education;Patient/family education;Dry needling;Passive range of motion;Manual techniques;Taping;Spinal Manipulations;Joint Manipulations    PT Next Visit Plan  HEp for shoulder.     Consulted and Agree with Plan of Care  Patient       Patient will benefit from skilled therapeutic intervention in order to improve the following deficits and impairments:  Abnormal gait, Hypomobility, Decreased activity tolerance, Decreased strength, Pain, Increased muscle spasms, Difficulty walking, Decreased mobility, Decreased balance, Decreased range of motion, Improper body mechanics, Impaired flexibility  Visit Diagnosis: Chronic right shoulder pain  Chronic pain of right knee  Chronic pain of left knee     Problem List Patient Active Problem List   Diagnosis Date Noted   . Plantar fasciitis 05/06/2017  . Tendonitis 05/06/2017  . Controlled type 2 diabetes mellitus without complication, without long-term current use of insulin ([place]) 11/01/2016  . Hypertension associated with diabetes ([place]) 11/01/2016  . Severe obesity (BMI 35.0-35.9 with comorbidity) ([place]) 11/01/2016  . Mass of skin of shoulder, right 12/27/2015  . Combined hyperlipidemia associated with type 2 diabetes mellitus ([place]) 01/15/2012  . Allergic rhinitis 10/05/2011  . Ulcerative colitis without complications ([place]) 67/28/9791  . Diverticulosis of colon 10/17/2010  . Internal hemorrhoids 10/17/2010  . Acquired hypothyroidism 10/04/2010  . Osteoarthrosis of knee 10/04/2010  . OSA on CPAP 06/28/2010    Lyndee Hensen, PT, DPT 1:01 PM  12/12/17    [facility] [place], [place], [address] Phone: [phone]   Fax:  [phone]  Name: BRITTNEI JAGIELLO MRN: [id number] Date of Birth: 07/13/1950

## 2017-12-17 ENCOUNTER — Ambulatory Visit (INDEPENDENT_AMBULATORY_CARE_PROVIDER_SITE_OTHER): Payer: Medicare Other | Admitting: Psychology

## 2017-12-17 DIAGNOSIS — F4323 Adjustment disorder with mixed anxiety and depressed mood: Secondary | ICD-10-CM

## 2017-12-19 ENCOUNTER — Encounter: Payer: Self-pay | Admitting: Physical Therapy

## 2017-12-19 ENCOUNTER — Ambulatory Visit (INDEPENDENT_AMBULATORY_CARE_PROVIDER_SITE_OTHER): Payer: Medicare Other | Admitting: Physical Therapy

## 2017-12-19 DIAGNOSIS — M25561 Pain in right knee: Secondary | ICD-10-CM

## 2017-12-19 DIAGNOSIS — M25511 Pain in right shoulder: Secondary | ICD-10-CM | POA: Diagnosis not present

## 2017-12-19 DIAGNOSIS — M25562 Pain in left knee: Secondary | ICD-10-CM

## 2017-12-19 DIAGNOSIS — G8929 Other chronic pain: Secondary | ICD-10-CM | POA: Diagnosis not present

## 2017-12-19 NOTE — Therapy (Signed)
[facility] [address] [place], [place], [address] Phone: [phone]   Fax:  [phone]  Physical Therapy Treatment  Patient Details  Name: Michelle Sherman MRN: [id number] Date of Birth: Mar 17, 1950 Referring Provider (PT): Billey Chang   Encounter Date: 12/19/2017  PT End of Session - 12/19/17 1328    Visit Number  3    Number of Visits  12    Date for PT Re-Evaluation  01/15/18    Authorization Type  Medicare    PT Start Time  1306    PT Stop Time  1347    PT Time Calculation (min)  41 min    Activity Tolerance  Patient tolerated treatment well    Behavior During Therapy  [facility] for tasks assessed/performed       Past Medical History:  Diagnosis Date  . Allergy   . Diabetes mellitus without complication ([place])   . Hypertension   . Thyroid disease     Past Surgical History:  Procedure Laterality Date  . CHOLECYSTECTOMY    . SKIN SURGERY  12/2015   Fatty tumor removed  . TONSILLECTOMY AND ADENOIDECTOMY  1956    There were no vitals filed for this visit.  Subjective Assessment - 12/19/17 1327    Subjective  Pt states improving pain in shoulder, knee has been sore.     Currently in Pain?  Yes    Pain Score  7     Pain Location  Knee    Pain Orientation  Right;Left    Pain Descriptors / Indicators  Aching    Pain Type  Chronic pain    Pain Radiating Towards  R>L    Pain Onset  More than a month ago    Pain Frequency  Intermittent    Aggravating Factors   Standing, walking stairs.     Pain Score  0    Pain Location  Shoulder    Pain Orientation  Right                       [facility] Adult PT Treatment/Exercise - 12/19/17 1304      Posture/Postural Control   Posture Comments  rounded shoulders, forward head/  flexible      Exercises   Exercises  Knee/Hip;Shoulder      Knee/Hip Exercises: Aerobic   Stationary Bike  L1 x 6 min      Knee/Hip Exercises: Seated   Long Arc Quad  Both;20 reps    Long Arc Quad Weight  2 lbs.      Knee/Hip Exercises: Supine   Straight Leg Raises  --      Knee/Hip Exercises: Sidelying   Hip ABduction  --      Shoulder Exercises: Supine   Protraction  20 reps    Protraction Weight (lbs)  2    Horizontal ABduction  20 reps    Horizontal ABduction Weight (lbs)  2    Flexion  AAROM;20 reps    Flexion Limitations  cane 2lb       Shoulder Exercises: Sidelying   External Rotation  20 reps;Both    External Rotation Weight (lbs)  2      Shoulder Exercises: Standing   External Rotation  20 reps    Theraband Level (Shoulder External Rotation)  Level 3 (Green)    Internal Rotation  20 reps;Theraband    Theraband Level (Shoulder Internal Rotation)  Level 3 (Green)    Flexion  15  reps    Flexion Limitations  [place] to 120 deg x15;     Row  20 reps    Theraband Level (Shoulder Row)  Level 3 (Green)      Shoulder Exercises: Pulleys   Flexion  2 minutes      Shoulder Exercises: Stretch   Corner Stretch  3 reps;30 seconds    Corner Stretch Limitations  45 and 90 deg               PT Short Term Goals - 12/04/17 1324      PT SHORT TERM GOAL #1   Title  Pt to be independent with initial HEP for shoulder and knees.     Time  2    Period  Weeks    Status  New    Target Date  12/18/17      PT SHORT TERM GOAL #2   Title  Pt to report decreased pain in R shoulder, to 3/10 with reading and computer work.     Time  2    Period  Weeks    Status  New    Target Date  12/18/17        PT Long Term Goals - 12/04/17 1325      PT LONG TERM GOAL #1   Title  Pt to independently demonstrate proper seated posture for shoulder, neck and back, to improve pain.     Time  6    Period  Weeks    Status  New    Target Date  01/15/18      PT LONG TERM GOAL #2   Title  Pt to demo increased strength of Bil hips and knees to at least 4+/5 to improve stability and pain.     Time  6    Period  Weeks    Status  New    Target Date  01/15/18      PT  LONG TERM GOAL #3   Title  Pt to demo improved strength of R shoulder, to at least 4+/5 to improve ability for reaching, lifting, and IADLS.     Time  6    Period  Weeks    Status  New    Target Date  01/15/18      PT LONG TERM GOAL #4   Title  Pt to report decreased pain in R shoulder, to 0-2/10 with activity, to improve ability for IADLs.       PT LONG TERM GOAL #5   Title  Pt to be independent with final HEP for knees and shoulder.     Time  6    Period  Weeks    Status  New    Target Date  01/15/18            Plan - 12/19/17 1349    Clinical Impression Statement  Pt with improving ability for shoulder strengthening today. She requires cueing for posture and mechanics with shoulder, to decrease poor posture. Pt with no increased pain with strengthening today, and is also improving with AROM. R knee is sore with standing/walking. Plan to progress as tolerated.     Rehab Potential  Good    PT Frequency  2x / week    PT Duration  6 weeks    PT Treatment/Interventions  ADLs/Self Care Home Management;Cryotherapy;Electrical Stimulation;Iontophoresis 2m/ml Dexamethasone;Moist Heat;Therapeutic activities;Functional mobility training;Stair training;Gait training;Ultrasound;Therapeutic exercise;Balance training;Neuromuscular re-education;Patient/family education;Dry needling;Passive range of motion;Manual techniques;Taping;Spinal Manipulations;Joint Manipulations    PT Next Visit  Plan  HEp for shoulder.     Consulted and Agree with Plan of Care  Patient       Patient will benefit from skilled therapeutic intervention in order to improve the following deficits and impairments:  Abnormal gait, Hypomobility, Decreased activity tolerance, Decreased strength, Pain, Increased muscle spasms, Difficulty walking, Decreased mobility, Decreased balance, Decreased range of motion, Improper body mechanics, Impaired flexibility  Visit Diagnosis: Chronic right shoulder pain  Chronic pain of right  knee  Chronic pain of left knee     Problem List Patient Active Problem List   Diagnosis Date Noted  . Plantar fasciitis 05/06/2017  . Tendonitis 05/06/2017  . Controlled type 2 diabetes mellitus without complication, without long-term current use of insulin ([place]) 11/01/2016  . Hypertension associated with diabetes ([place]) 11/01/2016  . Severe obesity (BMI 35.0-35.9 with comorbidity) ([place]) 11/01/2016  . Mass of skin of shoulder, right 12/27/2015  . Combined hyperlipidemia associated with type 2 diabetes mellitus ([place]) 01/15/2012  . Allergic rhinitis 10/05/2011  . Ulcerative colitis without complications ([place]) 65/80/0634  . Diverticulosis of colon 10/17/2010  . Internal hemorrhoids 10/17/2010  . Acquired hypothyroidism 10/04/2010  . Osteoarthrosis of knee 10/04/2010  . OSA on CPAP 06/28/2010    Lyndee Hensen, PT, DPT 4:09 PM  12/19/17    [facility] [place], [place], [address] Phone: [phone]   Fax:  [phone]  Name: Michelle Sherman MRN: [id number] Date of Birth: April 21, 1950

## 2017-12-24 ENCOUNTER — Ambulatory Visit (INDEPENDENT_AMBULATORY_CARE_PROVIDER_SITE_OTHER): Payer: Medicare Other | Admitting: Family Medicine

## 2017-12-24 ENCOUNTER — Other Ambulatory Visit: Payer: Self-pay

## 2017-12-24 ENCOUNTER — Encounter: Payer: Self-pay | Admitting: Family Medicine

## 2017-12-24 VITALS — BP 118/80 | HR 70 | Temp 98.1°F | Ht 60.0 in | Wt 196.0 lb

## 2017-12-24 DIAGNOSIS — E782 Mixed hyperlipidemia: Secondary | ICD-10-CM | POA: Diagnosis not present

## 2017-12-24 DIAGNOSIS — M170 Bilateral primary osteoarthritis of knee: Secondary | ICD-10-CM | POA: Diagnosis not present

## 2017-12-24 DIAGNOSIS — E039 Hypothyroidism, unspecified: Secondary | ICD-10-CM

## 2017-12-24 DIAGNOSIS — R202 Paresthesia of skin: Secondary | ICD-10-CM

## 2017-12-24 DIAGNOSIS — E119 Type 2 diabetes mellitus without complications: Secondary | ICD-10-CM

## 2017-12-24 DIAGNOSIS — E1169 Type 2 diabetes mellitus with other specified complication: Secondary | ICD-10-CM | POA: Diagnosis not present

## 2017-12-24 HISTORY — DX: Paresthesia of skin: R20.2

## 2017-12-24 LAB — LIPID PANEL
Cholesterol: 146 mg/dL (ref 0–200)
HDL: 75.8 mg/dL (ref >39.00)
LDL Cholesterol: 53 mg/dL (ref 0–99)
NonHDL: 70.47
Total CHOL/HDL Ratio: 2
Triglycerides: 88 mg/dL (ref 0.0–149.0)
VLDL: 17.6 mg/dL (ref 0.0–40.0)

## 2017-12-24 LAB — CBC WITH DIFFERENTIAL/PLATELET
Basophils Absolute: 0.1 10*3/uL (ref 0.0–0.1)
Basophils Relative: 1.1 % (ref 0.0–3.0)
Eosinophils Absolute: 0.2 10*3/uL (ref 0.0–0.7)
Eosinophils Relative: 2.7 % (ref 0.0–5.0)
HCT: 45.6 % (ref 36.0–46.0)
Hemoglobin: 15 g/dL (ref 12.0–15.0)
Lymphocytes Relative: 27.4 % (ref 12.0–46.0)
Lymphs Abs: 1.8 10*3/uL (ref 0.7–4.0)
MCHC: 32.9 g/dL (ref 30.0–36.0)
MCV: 89.5 fl (ref 78.0–100.0)
Monocytes Absolute: 0.4 10*3/uL (ref 0.1–1.0)
Monocytes Relative: 6.3 % (ref 3.0–12.0)
Neutro Abs: 4.1 10*3/uL (ref 1.4–7.7)
Neutrophils Relative %: 62.5 % (ref 43.0–77.0)
Platelets: 329 10*3/uL (ref 150.0–400.0)
RBC: 5.1 Mil/uL (ref 3.87–5.11)
RDW: 14 % (ref 11.5–15.5)
WBC: 6.6 10*3/uL (ref 4.0–10.5)

## 2017-12-24 LAB — COMPREHENSIVE METABOLIC PANEL
ALT: 16 U/L (ref 0–35)
AST: 14 U/L (ref 0–37)
Albumin: 4.4 g/dL (ref 3.5–5.2)
Alkaline Phosphatase: 70 U/L (ref 39–117)
BUN: 17 mg/dL (ref 6–23)
CO2: 28 mEq/L (ref 19–32)
Calcium: 10.1 mg/dL (ref 8.4–10.5)
Chloride: 101 mEq/L (ref 96–112)
Creatinine, Ser: 0.64 mg/dL (ref 0.40–1.20)
GFR: 97.97 mL/min (ref >60.00)
Glucose, Bld: 122 mg/dL — ABNORMAL HIGH (ref 70–99)
Potassium: 4.5 mEq/L (ref 3.5–5.1)
Sodium: 138 mEq/L (ref 135–145)
Total Bilirubin: 0.4 mg/dL (ref 0.2–1.2)
Total Protein: 7.5 g/dL (ref 6.0–8.3)

## 2017-12-24 LAB — POCT GLYCOSYLATED HEMOGLOBIN (HGB A1C): Hemoglobin A1C: 6.3 % — AB (ref 4.0–5.6)

## 2017-12-24 LAB — TSH: TSH: 0.77 u[IU]/mL (ref 0.35–4.50)

## 2017-12-24 LAB — MICROALBUMIN / CREATININE URINE RATIO
Creatinine,U: 85.4 mg/dL
Microalb Creat Ratio: 0.8 mg/g (ref 0.0–30.0)
Microalb, Ur: 0.7 mg/dL (ref 0.0–1.9)

## 2017-12-24 MED ORDER — CANAGLIFLOZIN 300 MG PO TABS: 300.0000 mg | ORAL_TABLET | Freq: Every day | ORAL | 3 refills | Status: DC

## 2017-12-24 NOTE — Patient Instructions (Signed)
Please return in 6 months For diabetes follow up.   If you have any questions or concerns, please don't hesitate to send me a message via MyChart or call the office at [phone]. Thank you for visiting with [place] today! It's our pleasure caring for you.  Please do these things to maintain good health!   Exercise at least 30-45 minutes a day,  4-5 days a week.   Eat a low-fat diet with lots of fruits and vegetables, up to 7-9 servings per day.  Drink plenty of water daily. Try to drink 8 8oz glasses per day.  Seatbelts can save your life. Always wear your seatbelt.  Place Smoke Detectors on every level of your home and check batteries every year.  Schedule an appointment with an eye doctor for an eye exam every 1-2 years  Safe sex - use condoms to protect yourself from STDs if you could be exposed to these types of infections. Use birth control if you do not want to become pregnant and are sexually active.  Avoid heavy alcohol use. If you drink, keep it to less than 2 drinks/day and not every day.  [place].  Choose someone you trust that could speak for you if you became unable to speak for yourself.  Depression is common in our stressful world.If you're feeling down or losing interest in things you normally enjoy, please come in for a visit.  If anyone is threatening or hurting you, please get help. Physical or Emotional Violence is never OK.

## 2017-12-24 NOTE — Progress Notes (Signed)
Subjective  Chief Complaint  Patient presents with  . Annual Exam    doing well, no complaints. Patient is fasting today   . Diabetes    last a1c 05/2017= 6.0    HPI: Michelle Sherman is a 67 y.o. female who presents to [place] [facility] today for a Female Wellness Visit. She also has the concerns and/or needs as listed above in the chief complaint. These will be addressed in addition to the Health Maintenance Visit.   Wellness Visit: annual visit with health maintenance review and exam without Pap   Overall doing well.  Fasting today for lab work.  Mammogram is up-to-date.  Immunizations are up-to-date. Chronic disease f/u and/or acute problem visit: (deemed necessary to be done in addition to the wellness visit):  Diabetes follow up: Her diabetic control is reported as Unchanged.  Remains controlled.  Needs refill of Invokana. She denies exertional CP or SOB or symptomatic hypoglycemia. She denies foot sores or paresthesias.  Hyperlipidemia on statin for follow-up today.  Fasting  Complains of bilateral knee pain intermittently.  We discussed her diagnosis of osteoarthritis in the past.  Has never had steroid or Synvisc injections.  Managing with physical therapy, Tylenol and glucosamine.  Hypothyroidism on thyroid supplement.  Energy levels are good.  No symptoms of high or low thyroid. Immunization History  Administered Date(s) Administered  . Influenza, High Dose Seasonal PF 12/06/2015, 11/01/2016, 09/21/2017  . Pneumococcal Conjugate-13 10/22/2014  . Pneumococcal Polysaccharide-23 10/05/2011, 12/21/2016  . Tdap 10/04/2010  . Zoster 10/03/2009    Diabetes Related Lab Review: Lab Results  Component Value Date   HGBA1C 6.3 (A) 12/24/2017   Lab Results  Component Value Date   MICROALBUR 0.9 12/21/2016   Lab Results  Component Value Date   CREATININE 0.64 11/01/2016   BUN 14 11/01/2016   NA 139 11/01/2016   K 4.1 11/01/2016   CL 101  11/01/2016   CO2 31 11/01/2016   Lab Results  Component Value Date   CHOL 144 11/01/2016   Lab Results  Component Value Date   HDL 65.50 11/01/2016   Lab Results  Component Value Date   LDLCALC 47 11/01/2016   Lab Results  Component Value Date   TRIG 158.0 (H) 11/01/2016   Lab Results  Component Value Date   CHOLHDL 2 11/01/2016   No results found for: LDLDIRECT The 10-year ASCVD risk score Mikey Bussing DC Jr., et al., 2013) is: 13.7%   Values used to calculate the score:     Age: 89 years     Sex: Female     Is Non-Hispanic African American: No     Diabetic: Yes     Tobacco smoker: No     Systolic Blood Pressure: 448 mmHg     Is BP treated: Yes     HDL Cholesterol: 65.5 mg/dL     Total Cholesterol: 144 mg/dL  Assessment  1. Controlled type 2 diabetes mellitus without complication, without long-term current use of insulin ([place])   2. Acquired hypothyroidism   3. Combined hyperlipidemia associated with type 2 diabetes mellitus ([place])   4. Primary osteoarthritis of both knees   5. Notalgia paresthetica      Plan  Female Wellness Visit:  Age appropriate Health Maintenance and Prevention measures were discussed with patient. Included topics are cancer screening recommendations, ways to keep healthy (see AVS) including dietary and exercise recommendations, regular eye and dental care, use of seat belts, and avoidance of moderate alcohol  use and tobacco use.   BMI: discussed patient's BMI and encouraged positive lifestyle modifications to help get to or maintain a target BMI.  HM needs and immunizations were addressed and ordered. See below for orders. See HM and immunization section for updates.  Routine labs and screening tests ordered including cmp, cbc and lipids where appropriate.  Discussed recommendations regarding Vit D and calcium supplementation (see AVS)  Chronic disease management visit and/or acute problem visit:  Diabetes remains well controlled.  Continue  current medications.  Immunizations are up-to-date.  Check urine microalbuminuria today.  She remains on an ACE inhibitor.  Blood pressure is well controlled.  Hyperlipidemia follow-up: On statin, tolerating well.  Recheck fasting lipids today.  Hypothyroidism: Stable clinically.  Recheck levels today.  Osteoarthritis: Conservative care and support discussed.  Would be a good candidate for Visco supplementation if she would like.  Notalgia paresthetica: Educated on diagnosis.  Supportive care. Follow up: Return in about 6 months (around 06/25/2018) for follow up Diabetes.  Orders Placed This Encounter  Procedures  . Comprehensive metabolic panel  . CBC with Differential/Platelet  . Lipid panel  . Microalbumin / creatinine urine ratio  . TSH  . POCT glycosylated hemoglobin (Hb A1C)   Meds ordered this encounter  Medications  . canagliflozin (INVOKANA) 300 MG TABS tablet    Sig: Take 1 tablet (300 mg total) by mouth daily.    Dispense:  90 tablet    Refill:  3      Lifestyle: Body mass index is 38.28 kg/m. Wt Readings from Last 3 Encounters:  12/24/17 196 lb (88.9 kg)  11/29/17 196 lb (88.9 kg)  10/31/17 199 lb 6.4 oz (90.4 kg)   Diet: Diabetic Exercise: rarely,   Patient Active Problem List   Diagnosis Date Noted  . Controlled type 2 diabetes mellitus without complication, without long-term current use of insulin ([place]) 11/01/2016    Priority: High    Overview:  Declined nutrition consult - due to lack of insurance coverage for this service 01/2013   . Hypertension associated with diabetes ([place]) 11/01/2016    Priority: High  . Severe obesity (BMI 35.0-35.9 with comorbidity) ([place]) 11/01/2016    Priority: High  . Combined hyperlipidemia associated with type 2 diabetes mellitus ([place]) 01/15/2012    Priority: High    Overview:  Goal LDL < 100   . Ulcerative colitis without complications ([place]) 17/91/5056    Priority: High    Overview:  No NSAIDS   . Acquired  hypothyroidism 10/04/2010    Priority: High  . OSA on CPAP 06/28/2010    Priority: High    NPSG 06/04/06- AHI ? 23/ hr, desaturation to 66%, CPAP titrated to 8, body weight 242 lbs.   . Diverticulosis of colon 10/17/2010    Priority: Medium  . Osteoarthrosis of knee 10/04/2010    Priority: Medium    Discussed synvisc 12/2017; has had PT. Had imaging - mild to moderate OA 2019   . Allergic rhinitis 10/05/2011    Priority: Low  . Internal hemorrhoids 10/17/2010    Priority: Low  . 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 Plantar fasciitis 05/06/2017  . Tendonitis 05/06/2017  . Mass of skin of shoulder, right 12/27/2015    Overview:  Added automatically from request for surgery [id number]    Health Maintenance  Topic Date Due  . HEMOGLOBIN A1C  12/20/2017  . FOOT EXAM  06/20/2018  . MAMMOGRAM  08/08/2018  . OPHTHALMOLOGY EXAM  11/25/2018  . DEXA SCAN  08/07/2020  . TETANUS/TDAP  10/03/2020  . COLONOSCOPY  04/01/2026  . INFLUENZA VACCINE  Completed  . Hepatitis C Screening  Completed  . PNA vac Low Risk Adult  Completed   Immunization History  Administered Date(s) Administered  . Influenza, High Dose Seasonal PF 12/06/2015, 11/01/2016, 09/21/2017  . Pneumococcal Conjugate-13 10/22/2014  . Pneumococcal Polysaccharide-23 10/05/2011, 12/21/2016  . Tdap 10/04/2010  . Zoster 10/03/2009   We updated and reviewed the patient's past history in detail and it is documented below. Allergies: Patient is allergic to nickel and sulfa antibiotics. Past Medical History Patient  has a past medical history of Allergy, Diabetes mellitus without complication ([place]), Hypertension, and Thyroid disease. Past Surgical History Patient  has a past surgical history that includes Skin surgery (12/2015); Cholecystectomy; and Tonsillectomy and adenoidectomy (1956). Family History: Patient family history includes Cancer in her brother and father; Crohn's disease in her brother; Diabetes in her brother,  brother, maternal aunt, maternal uncle, and mother; Heart attack in her paternal grandfather; Heart disease in her father; Ulcerative colitis in her brother and mother; Varicose Veins in her sister. Social History:  Patient  reports that she has never smoked. She has never used smokeless tobacco. She reports that she drinks alcohol. She reports that she does not use drugs.  Review of Systems: Constitutional: negative for fever or malaise Ophthalmic: negative for photophobia, double vision or loss of vision Cardiovascular: negative for chest pain, dyspnea on exertion, or new LE swelling Respiratory: negative for SOB or persistent cough Gastrointestinal: negative for abdominal pain, change in bowel habits or melena Genitourinary: negative for dysuria or gross hematuria, no abnormal uterine bleeding or disharge Musculoskeletal: negative for new gait disturbance or muscular weakness Integumentary: negative for new or persistent rashes, no breast lumps Neurological: negative for TIA or stroke symptoms Psychiatric: negative for SI or delusions Allergic/Immunologic: negative for hives  Patient Care Team    Relationship Specialty Notifications Start End  Leamon Arnt, MD PCP - General Family Medicine  08/04/16   Deneise Lever, MD Consulting Physician Pulmonary Disease  01/29/17   Juanita Craver, MD Consulting Physician Gastroenterology  03/21/17   Lyndee Hensen, PT Physical Therapist Physical Therapy  12/04/17     Objective  Vitals: BP 118/80   Pulse 70   Temp 98.1 F (36.7 C)   Ht 5' (1.524 m)   Wt 196 lb (88.9 kg)   SpO2 98%   BMI 38.28 kg/m  General:  Well developed, well nourished, no acute distress  Psych:  Alert and orientedx3,normal mood and affect HEENT:  Normocephalic, atraumatic, non-icteric sclera, PERRL, oropharynx is clear without mass or exudate, supple neck without adenopathy, mass or thyromegaly Cardiovascular:  Normal S1, S2, RRR without gallop, rub or murmur,  nondisplaced PMI Respiratory:  Good breath sounds bilaterally, CTAB with normal respiratory effort Gastrointestinal: normal bowel sounds, soft, non-tender, no noted masses. No HSM MSK: no deformities, contusions. Joints are without erythema or swelling. Spine and CVA region are nontender Skin:  Warm, no rashes or suspicious lesions noted, hypertrophied patch on left thoracic back Neurologic:    Mental status is normal. CN 2-11 are normal. Gross motor and sensory exams are normal. Normal gait. No tremor Breast Exam: No mass, skin retraction or nipple discharge is appreciated in either breast. No axillary adenopathy. Fibrocystic changes are not noted Diabetic Foot Exam: Appearance - no lesions, ulcers or calluses Skin - no sigificant pallor or erythema Monofilament testing - sensitive bilaterally in following locations:  Right - Doristine Devoid  toe, medial, central, lateral ball and posterior foot intact  Left - Great toe, medial, central, lateral ball and posterior foot intact Pulses - +2 distally bilaterally    Commons side effects, risks, benefits, and alternatives for medications and treatment plan prescribed today were discussed, and the patient expressed understanding of the given instructions. Patient is instructed to call or message via MyChart if he/she has any questions or concerns regarding our treatment plan. No barriers to understanding were identified. We discussed Red Flag symptoms and signs in detail. Patient expressed understanding regarding what to do in case of urgent or emergency type symptoms.   Medication list was reconciled, printed and provided to the patient in AVS. Patient instructions and summary information was reviewed with the patient as documented in the AVS. This note was prepared with assistance of Dragon voice recognition software. Occasional wrong-word or sound-a-like substitutions may have occurred due to the inherent limitations of voice recognition software

## 2017-12-26 ENCOUNTER — Encounter: Payer: Medicare Other | Admitting: Physical Therapy

## 2017-12-31 ENCOUNTER — Ambulatory Visit (INDEPENDENT_AMBULATORY_CARE_PROVIDER_SITE_OTHER): Payer: Medicare Other | Admitting: Psychology

## 2017-12-31 DIAGNOSIS — F4323 Adjustment disorder with mixed anxiety and depressed mood: Secondary | ICD-10-CM | POA: Diagnosis not present

## 2018-01-02 ENCOUNTER — Encounter: Payer: Self-pay | Admitting: Physical Therapy

## 2018-01-02 ENCOUNTER — Ambulatory Visit (INDEPENDENT_AMBULATORY_CARE_PROVIDER_SITE_OTHER): Payer: Medicare Other | Admitting: Physical Therapy

## 2018-01-02 DIAGNOSIS — M25562 Pain in left knee: Secondary | ICD-10-CM | POA: Diagnosis not present

## 2018-01-02 DIAGNOSIS — G8929 Other chronic pain: Secondary | ICD-10-CM

## 2018-01-02 DIAGNOSIS — M25561 Pain in right knee: Secondary | ICD-10-CM | POA: Diagnosis not present

## 2018-01-02 DIAGNOSIS — M25511 Pain in right shoulder: Secondary | ICD-10-CM | POA: Diagnosis not present

## 2018-01-02 NOTE — Therapy (Signed)
[facility] [address] [place], [place], [address] Phone: [phone]   Fax:  [phone]  Physical Therapy Treatment  Patient Details  Name: Michelle Sherman MRN: [id number] Date of Birth: [birth year] Referring Provider (PT): Billey Chang   Encounter Date: 01/02/2018  PT End of Session - 01/02/18 1326    Visit Number  4    Number of Visits  12    Date for PT Re-Evaluation  01/15/18    Authorization Type  Medicare    PT Start Time  1215    PT Stop Time  1301    PT Time Calculation (min)  46 min    Activity Tolerance  Patient tolerated treatment well    Behavior During Therapy  [facility] for tasks assessed/performed       Past Medical History:  Diagnosis Date  . Allergy   . Diabetes mellitus without complication ([place])   . Hypertension   . Thyroid disease     Past Surgical History:  Procedure Laterality Date  . CHOLECYSTECTOMY    . SKIN SURGERY  12/2015   Fatty tumor removed  . TONSILLECTOMY AND ADENOIDECTOMY  1956    There were no vitals filed for this visit.  Subjective Assessment - 01/02/18 1325    Subjective  Pt states improving pain in knee and shoulder, has not had much pain in either.     Patient Stated Goals  Decreased pain     Currently in Pain?  No/denies    Pain Score  0-No pain                       [facility] Adult PT Treatment/Exercise - 01/02/18 1211      Posture/Postural Control   Posture Comments  rounded shoulders, forward head/  flexible      Exercises   Exercises  Knee/Hip;Shoulder      Knee/Hip Exercises: Aerobic   Stationary Bike  L2 x 6 min      Knee/Hip Exercises: Standing   Knee Flexion  20 reps;Both    Hip Abduction  10 reps;Both      Knee/Hip Exercises: Seated   Long Arc Quad  Both;20 reps    Long Arc Quad Weight  2 lbs.   2.5 lb bil;      Shoulder Exercises: Supine   Protraction  --    Protraction Weight (lbs)  --    Horizontal ABduction  20 reps    Horizontal  ABduction Weight (lbs)  2    Flexion  AAROM;20 reps    Flexion Limitations  cane 2lb       Shoulder Exercises: Sidelying   External Rotation  --    External Rotation Weight (lbs)  --      Shoulder Exercises: Standing   External Rotation  20 reps    Theraband Level (Shoulder External Rotation)  Level 3 (Green)    Internal Rotation  20 reps;Theraband    Theraband Level (Shoulder Internal Rotation)  Level 3 (Green)    Flexion  --    Flexion Limitations  [place] to 120 deg x15;     ABduction  15 reps;Both    ABduction Limitations  to 90 deg    Row  20 reps    Theraband Level (Shoulder Row)  Level 3 (Green)    Other Standing Exercises  Wall push ups x15;       Shoulder Exercises: Pulleys   Flexion  --  Shoulder Exercises: Stretch   Corner Stretch  --    Corner Stretch Limitations  --               PT Short Term Goals - 01/02/18 1328      PT SHORT TERM GOAL #1   Title  Pt to be independent with initial HEP for shoulder and knees.     Time  2    Period  Weeks    Status  Achieved      PT SHORT TERM GOAL #2   Title  Pt to report decreased pain in R shoulder, to 3/10 with reading and computer work.     Time  2    Period  Weeks    Status  Achieved        PT Long Term Goals - 12/04/17 1325      PT LONG TERM GOAL #1   Title  Pt to independently demonstrate proper seated posture for shoulder, neck and back, to improve pain.     Time  6    Period  Weeks    Status  New    Target Date  01/15/18      PT LONG TERM GOAL #2   Title  Pt to demo increased strength of Bil hips and knees to at least 4+/5 to improve stability and pain.     Time  6    Period  Weeks    Status  New    Target Date  01/15/18      PT LONG TERM GOAL #3   Title  Pt to demo improved strength of R shoulder, to at least 4+/5 to improve ability for reaching, lifting, and IADLS.     Time  6    Period  Weeks    Status  New    Target Date  01/15/18      PT LONG TERM GOAL #4   Title  Pt to  report decreased pain in R shoulder, to 0-2/10 with activity, to improve ability for IADLs.       PT LONG TERM GOAL #5   Title  Pt to be independent with final HEP for knees and shoulder.     Time  6    Period  Weeks    Status  New    Target Date  01/15/18            Plan - 01/02/18 1329    Clinical Impression Statement  Pt with decreased pain levels today. She is doing well with LE strengthening, challenged with standing hip abd due to weakness. She requires max cuing for mechanics with shoulder ROM, due to poor scapular stability and asymmetry with elevation. Pt to benefit from continued strengthening. HEP reviewed. Plan to work towards d/c to HEP.     Rehab Potential  Good    PT Frequency  2x / week    PT Duration  6 weeks    PT Treatment/Interventions  ADLs/Self Care Home Management;Cryotherapy;Electrical Stimulation;Iontophoresis 18m/ml Dexamethasone;Moist Heat;Therapeutic activities;Functional mobility training;Stair training;Gait training;Ultrasound;Therapeutic exercise;Balance training;Neuromuscular re-education;Patient/family education;Dry needling;Passive range of motion;Manual techniques;Taping;Spinal Manipulations;Joint Manipulations    PT Next Visit Plan  HEp for shoulder.     Consulted and Agree with Plan of Care  Patient       Patient will benefit from skilled therapeutic intervention in order to improve the following deficits and impairments:  Abnormal gait, Hypomobility, Decreased activity tolerance, Decreased strength, Pain, Increased muscle spasms, Difficulty walking, Decreased mobility, Decreased balance, Decreased range  of motion, Improper body mechanics, Impaired flexibility  Visit Diagnosis: Chronic right shoulder pain  Chronic pain of right knee  Chronic pain of left knee     Problem List Patient Active Problem List   Diagnosis Date Noted  . 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 Plantar fasciitis 05/06/2017  . Tendonitis 05/06/2017  . Controlled type  2 diabetes mellitus without complication, without long-term current use of insulin ([place]) 11/01/2016  . Hypertension associated with diabetes ([place]) 11/01/2016  . Severe obesity (BMI 35.0-35.9 with comorbidity) ([place]) 11/01/2016  . Mass of skin of shoulder, right 12/27/2015  . Combined hyperlipidemia associated with type 2 diabetes mellitus ([place]) 01/15/2012  . Allergic rhinitis 10/05/2011  . Ulcerative colitis without complications ([place]) 84/03/3531  . Diverticulosis of colon 10/17/2010  . Internal hemorrhoids 10/17/2010  . Acquired hypothyroidism 10/04/2010  . Osteoarthrosis of knee 10/04/2010  . OSA on CPAP 06/28/2010   Lyndee Hensen, PT, DPT 1:38 PM  01/02/18    [facility] [place], [place], [address] Phone: [phone]   Fax:  [phone]  Name: TAMMA BRIGANDI MRN: [id number] Date of Birth: [birth year]

## 2018-01-06 ENCOUNTER — Encounter: Payer: Self-pay | Admitting: Family Medicine

## 2018-01-07 MED ORDER — LEVOTHYROXINE SODIUM 112 MCG PO TABS: 112.0000 ug | ORAL_TABLET | Freq: Every day | ORAL | 3 refills | Status: DC

## 2018-01-09 ENCOUNTER — Encounter: Payer: Medicare Other | Admitting: Physical Therapy

## 2018-01-10 ENCOUNTER — Encounter: Payer: Self-pay | Admitting: Physical Therapy

## 2018-01-10 ENCOUNTER — Ambulatory Visit (INDEPENDENT_AMBULATORY_CARE_PROVIDER_SITE_OTHER): Payer: Medicare Other | Admitting: Physical Therapy

## 2018-01-10 DIAGNOSIS — M25511 Pain in right shoulder: Secondary | ICD-10-CM | POA: Diagnosis not present

## 2018-01-10 DIAGNOSIS — M25561 Pain in right knee: Secondary | ICD-10-CM | POA: Diagnosis not present

## 2018-01-10 DIAGNOSIS — G8929 Other chronic pain: Secondary | ICD-10-CM

## 2018-01-10 NOTE — Therapy (Signed)
[facility] [address] [place], [place], [address] Phone: [phone]   Fax:  [phone]  Physical Therapy Treatment/Discharge   Patient Details  Name: Michelle Sherman MRN: [id number] Date of Birth: 11-22-1950 Referring Provider (PT): Billey Chang   Encounter Date: 01/10/2018  PT End of Session - 01/10/18 1220    Visit Number  5    Number of Visits  12    Date for PT Re-Evaluation  01/15/18    Authorization Type  Medicare    PT Start Time  1210    PT Stop Time  1250    PT Time Calculation (min)  40 min    Activity Tolerance  Patient tolerated treatment well    Behavior During Therapy  [facility] for tasks assessed/performed       Past Medical History:  Diagnosis Date  . Allergy   . Diabetes mellitus without complication ([place])   . Hypertension   . Thyroid disease     Past Surgical History:  Procedure Laterality Date  . CHOLECYSTECTOMY    . SKIN SURGERY  12/2015   Fatty tumor removed  . TONSILLECTOMY AND ADENOIDECTOMY  1956    There were no vitals filed for this visit.  Subjective Assessment - 01/10/18 1217    Subjective  Pt states minimal pain in knee and shoulder.     Patient Stated Goals  Decreased pain     Currently in Pain?  No/denies    Pain Score  0-No pain         [facility] PT Assessment - 01/10/18 1221      Posture/Postural Control   Posture Comments  rounded shoulders, forward head/  flexible                   [facility] Adult PT Treatment/Exercise - 01/10/18 1221      Exercises   Exercises  Knee/Hip;Shoulder      Knee/Hip Exercises: Aerobic   Stationary Bike  L2 x 6 min      Knee/Hip Exercises: Standing   Knee Flexion  20 reps;Both    Knee Flexion Limitations  2lb x20     Hip Abduction  Both;20 reps      Knee/Hip Exercises: Seated   Long Arc Quad  Both;20 reps    Long Arc Quad Weight  2 lbs.   2.5 lb bil;      Knee/Hip Exercises: Supine   Straight Leg Raises  Both;10 reps;2 sets       Shoulder Exercises: Supine   Horizontal ABduction  20 reps    Horizontal ABduction Weight (lbs)  2    Flexion  AAROM;20 reps    Flexion Limitations  cane 2lb       Shoulder Exercises: Standing   External Rotation  20 reps    Theraband Level (Shoulder External Rotation)  Level 3 (Green)    Internal Rotation  20 reps;Theraband    Theraband Level (Shoulder Internal Rotation)  Level 3 (Green)    Flexion Limitations  [place] to 120 deg   (Pended)     ABduction  15 reps;Both    ABduction Limitations  to 90 deg    Row  20 reps    Theraband Level (Shoulder Row)  Level 3 (Green)    Other Standing Exercises  Wall push ups x15;              PT Education - 01/10/18 1218    Education Details  HEP reviewed.  Person(s) Educated  Patient    Methods  Explanation;Handout    Comprehension  Verbalized understanding       PT Short Term Goals - 01/02/18 1328      PT SHORT TERM GOAL #1   Title  Pt to be independent with initial HEP for shoulder and knees.     Time  2    Period  Weeks    Status  Achieved      PT SHORT TERM GOAL #2   Title  Pt to report decreased pain in R shoulder, to 3/10 with reading and computer work.     Time  2    Period  Weeks    Status  Achieved        PT Long Term Goals - 01/10/18 1413      PT LONG TERM GOAL #1   Title  Pt to independently demonstrate proper seated posture for shoulder, neck and back, to improve pain.     Time  6    Period  Weeks    Status  Achieved      PT LONG TERM GOAL #2   Title  Pt to demo increased strength of Bil hips and knees to at least 4+/5 to improve stability and pain.     Time  6    Period  Weeks    Status  Achieved      PT LONG TERM GOAL #3   Title  Pt to demo improved strength of R shoulder, to at least 4+/5 to improve ability for reaching, lifting, and IADLS.     Time  6    Period  Weeks    Status  Achieved      PT LONG TERM GOAL #4   Title  Pt to report decreased pain in R shoulder, to 0-2/10 with  activity, to improve ability for IADLs.     Status  Achieved      PT LONG TERM GOAL #5   Title  Pt to be independent with final HEP for knees and shoulder.     Time  6    Period  Weeks    Status  Achieved            Plan - 01/10/18 1413    Clinical Impression Statement  Pt reports no pain. She admits to not doing HEP as frequently as needed. Final HEP and importance reviewed today. Discussed ways to make time in pts schedule. Pt to benefit from continued strengthening. Pt has much improved strength of R shoulder, and has little/no pain. Pt ready for d/c to HEP, has met goals at this time.     Rehab Potential  Good    PT Frequency  2x / week    PT Duration  6 weeks    PT Treatment/Interventions  ADLs/Self Care Home Management;Cryotherapy;Electrical Stimulation;Iontophoresis 60m/ml Dexamethasone;Moist Heat;Therapeutic activities;Functional mobility training;Stair training;Gait training;Ultrasound;Therapeutic exercise;Balance training;Neuromuscular re-education;Patient/family education;Dry needling;Passive range of motion;Manual techniques;Taping;Spinal Manipulations;Joint Manipulations    PT Next Visit Plan  HEp for shoulder.     Consulted and Agree with Plan of Care  Patient       Patient will benefit from skilled therapeutic intervention in order to improve the following deficits and impairments:  Abnormal gait, Hypomobility, Decreased activity tolerance, Decreased strength, Pain, Increased muscle spasms, Difficulty walking, Decreased mobility, Decreased balance, Decreased range of motion, Improper body mechanics, Impaired flexibility  Visit Diagnosis: Chronic right shoulder pain  Chronic pain of right knee     Problem List  Patient Active Problem List   Diagnosis Date Noted  . 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 Plantar fasciitis 05/06/2017  . Tendonitis 05/06/2017  . Controlled type 2 diabetes mellitus without complication, without long-term current use of insulin ([place])  11/01/2016  . Hypertension associated with diabetes ([place]) 11/01/2016  . Severe obesity (BMI 35.0-35.9 with comorbidity) ([place]) 11/01/2016  . Mass of skin of shoulder, right 12/27/2015  . Combined hyperlipidemia associated with type 2 diabetes mellitus ([place]) 01/15/2012  . Allergic rhinitis 10/05/2011  . Ulcerative colitis without complications ([place]) 20/26/6916  . Diverticulosis of colon 10/17/2010  . Internal hemorrhoids 10/17/2010  . Acquired hypothyroidism 10/04/2010  . Osteoarthrosis of knee 10/04/2010  . OSA on CPAP 06/28/2010    Lyndee Hensen, PT, DPT 2:15 PM  01/10/18    [place], [place], [address] Phone: [phone]   Fax:  [phone]  Name: YAZHINI MCAULAY MRN: [id number] Date of Birth: 07/10/1950     PHYSICAL THERAPY DISCHARGE SUMMARY  Visits from Start of Care: 5   Plan: Patient agrees to discharge.  Patient goals were met. Patient is being discharged due to meeting the stated rehab goals.  ?????         Lyndee Hensen, PT, DPT 2:15 PM  01/10/18

## 2018-01-14 ENCOUNTER — Ambulatory Visit (INDEPENDENT_AMBULATORY_CARE_PROVIDER_SITE_OTHER): Payer: Medicare Other | Admitting: Psychology

## 2018-01-14 DIAGNOSIS — F4323 Adjustment disorder with mixed anxiety and depressed mood: Secondary | ICD-10-CM | POA: Diagnosis not present

## 2018-02-05 ENCOUNTER — Ambulatory Visit (INDEPENDENT_AMBULATORY_CARE_PROVIDER_SITE_OTHER): Payer: Medicare Other | Admitting: Psychology

## 2018-02-05 DIAGNOSIS — F4323 Adjustment disorder with mixed anxiety and depressed mood: Secondary | ICD-10-CM | POA: Diagnosis not present

## 2018-02-25 ENCOUNTER — Ambulatory Visit (INDEPENDENT_AMBULATORY_CARE_PROVIDER_SITE_OTHER): Payer: Medicare Other | Admitting: Psychology

## 2018-02-25 DIAGNOSIS — F4323 Adjustment disorder with mixed anxiety and depressed mood: Secondary | ICD-10-CM

## 2018-03-06 ENCOUNTER — Ambulatory Visit: Payer: Medicare Other | Admitting: Psychology

## 2018-03-20 ENCOUNTER — Ambulatory Visit (INDEPENDENT_AMBULATORY_CARE_PROVIDER_SITE_OTHER): Payer: Medicare Other | Admitting: Psychology

## 2018-03-20 DIAGNOSIS — F4323 Adjustment disorder with mixed anxiety and depressed mood: Secondary | ICD-10-CM | POA: Diagnosis not present

## 2018-03-26 NOTE — Progress Notes (Signed)
Subjective:   Michelle Sherman is a 68 y.o. female who presents for Medicare Annual (Subsequent) preventive examination.  Review of Systems:  No ROS.  Medicare Wellness Visit. Additional risk factors are reflected in the social history.  Cardiac Risk Factors include: advanced age (>22mn, >>52women);obesity (BMI >30kg/m2);diabetes mellitus;hypertension;family history of premature cardiovascular disease;sedentary lifestyle Sleep patterns: Sleeps 8 hours, uses CPAP. Home Safety/Smoke Alarms: Feels safe in home. Smoke alarms in place.  Living environment; residence and Firearm Safety: Lives with husband in 1 story home. Rail and ramp in place.  Seat Belt Safety/Bike Helmet: Wears seat belt.   Female:   [id number]      Mammo-08/07/2017, BI-RADS CATEGORY  1: Negative.       Dexa scan-08/07/2017, normal.        CCS-03/31/2016, Dr. MCollene Mares Pt reports polyp. Recall 5 years.      Objective:     Vitals: BP 122/60 (BP Location: Left Arm, Patient Position: Sitting, Cuff Size: Normal)   Pulse 84   Ht 5' (1.524 m)   Wt 202 lb 4 oz (91.7 kg)   SpO2 97%   BMI 39.50 kg/m   Body mass index is 39.5 kg/m.  Advanced Directives 03/27/2018 12/04/2017 03/21/2017  Does Patient Have a Medical Advance Directive? No No No  Would patient like information on creating a medical advance directive? Yes (MAU/Ambulatory/Procedural Areas - Information given) No - Patient declined Yes (MAU/Ambulatory/Procedural Areas - Information given)    Tobacco Social History   Tobacco Use  Smoking Status Never Smoker  Smokeless Tobacco Never Used     Counseling given: Not Answered    Past Medical History:  Diagnosis Date  . Allergy   . Diabetes mellitus without complication ([place]   . Hypertension   . Thyroid disease    Past Surgical History:  Procedure Laterality Date  . CHOLECYSTECTOMY    . SKIN SURGERY  12/2015   Fatty tumor removed  . TONSILLECTOMY AND ADENOIDECTOMY  1956   Family History  Problem  Relation Age of Onset  . Ulcerative colitis Mother   . Diabetes Mother   . Cancer Father        Lung  . Heart disease Father   . Varicose Veins Sister   . Other Sister        "stiff heart"  . Cancer Brother        Pancreatic  . Diabetes Brother   . Diabetes Maternal Aunt   . Diabetes Maternal Uncle   . Heart attack Paternal Grandfather   . Crohn's disease Brother   . Ulcerative colitis Brother   . Diabetes Brother    Social History   Socioeconomic History  . Marital status: Married    Spouse name: Not on file  . Number of children: Not on file  . Years of education: Not on file  . Highest education level: Not on file  Occupational History  . Not on file  Social Needs  . Financial resource strain: Not on file  . Food insecurity:    Worry: Not on file    Inability: Not on file  . Transportation needs:    Medical: Not on file    Non-medical: Not on file  Tobacco Use  . Smoking status: Never Smoker  . Smokeless tobacco: Never Used  Substance and Sexual Activity  . Alcohol use: Yes    Comment: occa  . Drug use: No  . Sexual activity: Yes  Lifestyle  . Physical activity:  Days per week: Not on file    Minutes per session: Not on file  . Stress: Not on file  Relationships  . Social connections:    Talks on phone: Not on file    Gets together: Not on file    Attends religious service: Not on file    Active member of club or organization: Not on file    Attends meetings of clubs or organizations: Not on file    Relationship status: Not on file  Other Topics Concern  . Not on file  Social History Narrative   Married, cares for disabled husband, no children, no tob, Etoh or drug use; no exercise    Outpatient Encounter Medications as of 03/27/2018  Medication Sig  . aspirin EC 81 MG tablet Take 1 tablet by mouth daily.  Marland Kitchen atorvastatin (LIPITOR) 10 MG tablet Take 1 tablet (10 mg total) by mouth every evening.  Marland Kitchen BIOTIN 5000 PO Take 1 tablet by mouth daily.  .  Calcium Citrate-Vitamin D 200-250 MG-UNIT TABS Take 1 tablet by mouth daily.  . canagliflozin (INVOKANA) 300 MG TABS tablet Take 1 tablet (300 mg total) by mouth daily.  . Cholecalciferol (VITAMIN D3) 2000 units capsule Take 1 capsule by mouth daily.  . diclofenac sodium (VOLTAREN) 1 % GEL Apply 2 g topically 4 (four) times daily. Rub into affected area of foot 2 to 4 times daily  . fexofenadine (ALLEGRA) 180 MG tablet Take 1 tablet by mouth daily.  . fluticasone (FLONASE) 50 MCG/ACT nasal spray Place 2 sprays into the nose daily as needed.  Marland Kitchen glucosamine-chondroitin 500-400 MG tablet Take 1 tablet by mouth 3 (three) times daily.  Marland Kitchen levothyroxine (SYNTHROID) 112 MCG tablet Take 1 tablet (112 mcg total) by mouth daily.  . mesalamine (LIALDA) 1.2 g EC tablet Take by mouth. Take 2 tablet in am and 2 tablet in pm  . metFORMIN (GLUCOPHAGE) 1000 MG tablet TAKE ONE TABLET BY MOUTH TWICE A DAY WITH MEALS   . Misc Natural Products (TURMERIC CURCUMIN) CAPS Take 1 capsule by mouth daily.  . Multiple Vitamin (MULTIVITAMIN) tablet Take 1 tablet by mouth daily.  . ramipril (ALTACE) 10 MG capsule TAKE ONE CAPSULE BY MOUTH ONE TIME DAILY   . traMADol (ULTRAM) 50 MG tablet Take 1 tablet (50 mg total) by mouth every 8 (eight) hours as needed.  . Zoster Vaccine Adjuvanted [facility]) injection Inject 0.5 mLs into the muscle once for 1 dose.   No facility-administered encounter medications on file as of 03/27/2018.     Activities of Daily Living In your present state of health, do you have any difficulty performing the following activities: 03/27/2018  Hearing? N  Vision? N  Difficulty concentrating or making decisions? N  Walking or climbing stairs? N  Dressing or bathing? N  Doing errands, shopping? N  Preparing Food and eating ? N  Using the Toilet? N  In the past six months, have you accidently leaked urine? N  Do you have problems with loss of bowel control? N  Managing your Medications? N  Managing your  Finances? N  Housekeeping or managing your Housekeeping? N  Some recent data might be hidden    Patient Care Team: Leamon Arnt, MD as PCP - General (Family Medicine) Deneise Lever, MD as Consulting Physician (Pulmonary Disease) Juanita Craver, MD as Consulting Physician (Gastroenterology) Lyndee Hensen, PT as Physical Therapist (Physical Therapy) Trula Slade, DPM as Consulting Physician (Podiatry)    Assessment:   This  is a routine wellness examination for Zareya.  Exercise Activities and Dietary recommendations Current Exercise Habits: The patient does not participate in regular exercise at present, Exercise limited by: None identified;orthopedic condition(s) Diet (meal preparation, eat out, water intake, caffeinated beverages, dairy products, fruits and vegetables): Drinks water.  Breakfast: eggs; oatmeal; toast; cereal; coffee Lunch: sandwich, salad Dinner: Take out ([place], pizza, subs)  Goals    . Weight (lb) < 180 lb (81.6 kg)     Lose weight by continuing weight watchers and increasing exercise.     . Weight (lb) < 195 lb (88.5 kg)     Lose weight by watching carb intake and increase activity.        Fall Risk Fall Risk  03/27/2018 06/19/2017 03/21/2017 11/01/2016  Falls in the past year? 0 No No No    Depression Screen PHQ 2/9 Scores 03/27/2018 06/19/2017 06/19/2017 03/21/2017  PHQ - 2 Score 0 0 0 0  PHQ- 9 Score - - 0 -     Cognitive Function MMSE - Mini Mental State Exam 03/27/2018  Orientation to time 5  Orientation to Place 5  Registration 3  Attention/ Calculation 5  Recall 3  Language- name 2 objects 2  Language- repeat 1  Language- follow 3 step command 3  Language- read & follow direction 1  Write a sentence 1  Copy design 1  Total score 30        Immunization History  Administered Date(s) Administered  . Influenza, High Dose Seasonal PF 12/06/2015, 11/01/2016, 09/21/2017  . Pneumococcal Conjugate-13 10/22/2014  . Pneumococcal  Polysaccharide-23 10/05/2011, 12/21/2016  . Tdap 10/04/2010  . Zoster 10/03/2009     Screening Tests Health Maintenance  Topic Date Due  . FOOT EXAM  06/20/2018  . HEMOGLOBIN A1C  06/25/2018  . MAMMOGRAM  08/08/2018  . OPHTHALMOLOGY EXAM  11/25/2018  . DEXA SCAN  08/07/2020  . TETANUS/TDAP  10/03/2020  . COLONOSCOPY  04/01/2026  . INFLUENZA VACCINE  Completed  . Hepatitis C Screening  Completed  . PNA vac Low Risk Adult  Completed        Plan:     Shingles vaccine at pharmacy.   Schedule mammogram after August 09, 2018.   Bring a copy of your living will and/or healthcare power of attorney to your next office visit.  Continue doing brain stimulating activities (puzzles, reading, adult coloring books, staying active) to keep memory sharp.   I have personally reviewed and noted the following in the patient's chart:   . Medical and social history . Use of alcohol, tobacco or illicit drugs  . Current medications and supplements . Functional ability and status . Nutritional status . Physical activity . Advanced directives . List of other physicians . Hospitalizations, surgeries, and ER visits in previous 12 months . Vitals . Screenings to include cognitive, depression, and falls . Referrals and appointments  In addition, I have reviewed and discussed with patient certain preventive protocols, quality metrics, and best practice recommendations. A written personalized care plan for preventive services as well as general preventive health recommendations were provided to patient.     Gerilyn Nestle, RN  03/27/2018   PCP f/u 06/2018

## 2018-03-27 ENCOUNTER — Other Ambulatory Visit: Payer: Self-pay

## 2018-03-27 ENCOUNTER — Ambulatory Visit (INDEPENDENT_AMBULATORY_CARE_PROVIDER_SITE_OTHER): Payer: Medicare Other

## 2018-03-27 VITALS — BP 122/60 | HR 84 | Ht 60.0 in | Wt 202.2 lb

## 2018-03-27 DIAGNOSIS — Z23 Encounter for immunization: Secondary | ICD-10-CM

## 2018-03-27 DIAGNOSIS — E669 Obesity, unspecified: Secondary | ICD-10-CM

## 2018-03-27 DIAGNOSIS — Z0000 Encounter for general adult medical examination without abnormal findings: Secondary | ICD-10-CM

## 2018-03-27 MED ORDER — ZOSTER VAC RECOMB ADJUVANTED 50 MCG/0.5ML IM SUSR: 0.5000 mL | Freq: Once | INTRAMUSCULAR | 1 refills | Status: AC

## 2018-03-27 NOTE — Patient Instructions (Addendum)
Shingles vaccine at pharmacy.   Schedule mammogram after August 09, 2018.   Bring a copy of your living will and/or healthcare power of attorney to your next office visit.  Continue doing brain stimulating activities (puzzles, reading, adult coloring books, staying active) to keep memory sharp.   Health Maintenance, Female Adopting a healthy lifestyle and getting preventive care can go a long way to promote health and wellness. Talk with your health care provider about what schedule of regular examinations is right for you. This is a good chance for you to check in with your provider about disease prevention and staying healthy. In between checkups, there are plenty of things you can do on your own. Experts have done a lot of research about which lifestyle changes and preventive measures are most likely to keep you healthy. Ask your health care provider for more information. Weight and diet Eat a healthy diet  Be sure to include plenty of vegetables, fruits, low-fat dairy products, and lean protein.  Do not eat a lot of foods high in solid fats, added sugars, or salt.  Get regular exercise. This is one of the most important things you can do for your health. ? Most adults should exercise for at least 150 minutes each week. The exercise should increase your heart rate and make you sweat (moderate-intensity exercise). ? Most adults should also do strengthening exercises at least twice a week. This is in addition to the moderate-intensity exercise. Maintain a healthy weight  Body mass index (BMI) is a measurement that can be used to identify possible weight problems. It estimates body fat based on height and weight. Your health care provider can help determine your BMI and help you achieve or maintain a healthy weight.  For females 65 years of age and older: ? A BMI below 18.5 is considered underweight. ? A BMI of 18.5 to 24.9 is normal. ? A BMI of 25 to 29.9 is considered overweight. ? A BMI  of 30 and above is considered obese. Watch levels of cholesterol and blood lipids  You should start having your blood tested for lipids and cholesterol at 69 years of age, then have this test every 5 years.  You may need to have your cholesterol levels checked more often if: ? Your lipid or cholesterol levels are high. ? You are older than 69 years of age. ? You are at high risk for heart disease. Cancer screening Lung Cancer  Lung cancer screening is recommended for adults 67-66 years old who are at high risk for lung cancer because of a history of smoking.  A yearly low-dose CT scan of the lungs is recommended for people who: ? Currently smoke. ? Have quit within the past 15 years. ? Have at least a 30-pack-year history of smoking. A pack year is smoking an average of one pack of cigarettes a day for 1 year.  Yearly screening should continue until it has been 15 years since you quit.  Yearly screening should stop if you develop a health problem that would prevent you from having lung cancer treatment. Breast Cancer  Practice breast self-awareness. This means understanding how your breasts normally appear and feel.  It also means doing regular breast self-exams. Let your health care provider know about any changes, no matter how small.  If you are in your 20s or 30s, you should have a clinical breast exam (CBE) by a health care provider every 1-3 years as part of a regular health exam.  If you are 40 or older, have a CBE every year. Also consider having a breast X-ray (mammogram) every year.  If you have a family history of breast cancer, talk to your health care provider about genetic screening.  If you are at high risk for breast cancer, talk to your health care provider about having an MRI and a mammogram every year.  Breast cancer gene (BRCA) assessment is recommended for women who have family members with BRCA-related cancers. BRCA-related cancers  include: ? Breast. ? Ovarian. ? Tubal. ? Peritoneal cancers.  Results of the assessment will determine the need for genetic counseling and BRCA1 and BRCA2 testing. Cervical Cancer Your health care provider may recommend that you be screened regularly for cancer of the pelvic organs (ovaries, uterus, and vagina). This screening involves a pelvic examination, including checking for microscopic changes to the surface of your cervix (Pap test). You may be encouraged to have this screening done every 3 years, beginning at age 41.  For women ages 4-65, health care providers may recommend pelvic exams and Pap testing every 3 years, or they may recommend the Pap and pelvic exam, combined with testing for human papilloma virus (HPV), every 5 years. Some types of HPV increase your risk of cervical cancer. Testing for HPV may also be done on women of any age with unclear Pap test results.  Other health care providers may not recommend any screening for nonpregnant women who are considered low risk for pelvic cancer and who do not have symptoms. Ask your health care provider if a screening pelvic exam is right for you.  If you have had past treatment for cervical cancer or a condition that could lead to cancer, you need Pap tests and screening for cancer for at least 20 years after your treatment. If Pap tests have been discontinued, your risk factors (such as having a new sexual partner) need to be reassessed to determine if screening should resume. Some women have medical problems that increase the chance of getting cervical cancer. In these cases, your health care provider may recommend more frequent screening and Pap tests. Colorectal Cancer  This type of cancer can be detected and often prevented.  Routine colorectal cancer screening usually begins at 69 years of age and continues through 69 years of age.  Your health care provider may recommend screening at an earlier age if you have risk factors for  colon cancer.  Your health care provider may also recommend using home test kits to check for hidden blood in the stool.  A small camera at the end of a tube can be used to examine your colon directly (sigmoidoscopy or colonoscopy). This is done to check for the earliest forms of colorectal cancer.  Routine screening usually begins at age 77.  Direct examination of the colon should be repeated every 5-10 years through 69 years of age. However, you may need to be screened more often if early forms of precancerous polyps or small growths are found. Skin Cancer  Check your skin from head to toe regularly.  Tell your health care provider about any new moles or changes in moles, especially if there is a change in a mole's shape or color.  Also tell your health care provider if you have a mole that is larger than the size of a pencil eraser.  Always use sunscreen. Apply sunscreen liberally and repeatedly throughout the day.  Protect yourself by wearing long sleeves, pants, a wide-brimmed hat, and sunglasses whenever you  are outside. Heart disease, diabetes, and high blood pressure  High blood pressure causes heart disease and increases the risk of stroke. High blood pressure is more likely to develop in: ? People who have blood pressure in the high end of the normal range (130-139/85-89 mm Hg). ? People who are overweight or obese. ? People who are African American.  If you are 37-58 years of age, have your blood pressure checked every 3-5 years. If you are 51 years of age or older, have your blood pressure checked every year. You should have your blood pressure measured twice-once when you are at a hospital or clinic, and once when you are not at a hospital or clinic. Record the average of the two measurements. To check your blood pressure when you are not at a hospital or clinic, you can use: ? An automated blood pressure machine at a pharmacy. ? A home blood pressure monitor.  If you are  between 16 years and 63 years old, ask your health care provider if you should take aspirin to prevent strokes.  Have regular diabetes screenings. This involves taking a blood sample to check your fasting blood sugar level. ? If you are at a normal weight and have a low risk for diabetes, have this test once every three years after 69 years of age. ? If you are overweight and have a high risk for diabetes, consider being tested at a younger age or more often. Preventing infection Hepatitis B  If you have a higher risk for hepatitis B, you should be screened for this virus. You are considered at high risk for hepatitis B if: ? You were born in a country where hepatitis B is common. Ask your health care provider which countries are considered high risk. ? Your parents were born in a high-risk country, and you have not been immunized against hepatitis B (hepatitis B vaccine). ? You have HIV or AIDS. ? You use needles to inject street drugs. ? You live with someone who has hepatitis B. ? You have had sex with someone who has hepatitis B. ? You get hemodialysis treatment. ? You take certain medicines for conditions, including cancer, organ transplantation, and autoimmune conditions. Hepatitis C  Blood testing is recommended for: ? Everyone born from [birth year] through 1966. ? Anyone with known risk factors for hepatitis C. Sexually transmitted infections (STIs)  You should be screened for sexually transmitted infections (STIs) including gonorrhea and chlamydia if: ? You are sexually active and are younger than 69 years of age. ? You are older than 69 years of age and your health care provider tells you that you are at risk for this type of infection. ? Your sexual activity has changed since you were last screened and you are at an increased risk for chlamydia or gonorrhea. Ask your health care provider if you are at risk.  If you do not have HIV, but are at risk, it may be recommended that you take  a prescription medicine daily to prevent HIV infection. This is called pre-exposure prophylaxis (PrEP). You are considered at risk if: ? You are sexually active and do not regularly use condoms or know the HIV status of your partner(s). ? You take drugs by injection. ? You are sexually active with a partner who has HIV. Talk with your health care provider about whether you are at high risk of being infected with HIV. If you choose to begin PrEP, you should first be tested for HIV. You  should then be tested every 3 months for as long as you are taking PrEP. Pregnancy  If you are premenopausal and you may become pregnant, ask your health care provider about preconception counseling.  If you may become pregnant, take 400 to 800 micrograms (mcg) of folic acid every day.  If you want to prevent pregnancy, talk to your health care provider about birth control (contraception). Osteoporosis and menopause  Osteoporosis is a disease in which the bones lose minerals and strength with aging. This can result in serious bone fractures. Your risk for osteoporosis can be identified using a bone density scan.  If you are 85 years of age or older, or if you are at risk for osteoporosis and fractures, ask your health care provider if you should be screened.  Ask your health care provider whether you should take a calcium or vitamin D supplement to lower your risk for osteoporosis.  Menopause may have certain physical symptoms and risks.  Hormone replacement therapy may reduce some of these symptoms and risks. Talk to your health care provider about whether hormone replacement therapy is right for you. Follow these instructions at home:  Schedule regular health, dental, and eye exams.  Stay current with your immunizations.  Do not use any tobacco products including cigarettes, chewing tobacco, or electronic cigarettes.  If you are pregnant, do not drink alcohol.  If you are breastfeeding, limit how  much and how often you drink alcohol.  Limit alcohol intake to no more than 1 drink per day for nonpregnant women. One drink equals 12 ounces of beer, 5 ounces of wine, or 1 ounces of hard liquor.  Do not use street drugs.  Do not share needles.  Ask your health care provider for help if you need support or information about quitting drugs.  Tell your health care provider if you often feel depressed.  Tell your health care provider if you have ever been abused or do not feel safe at home. This information is not intended to replace advice given to you by your health care provider. Make sure you discuss any questions you have with your health care provider. Document Released: 07/25/2010 Document Revised: 06/17/2015 Document Reviewed: 10/13/2014 Elsevier Interactive Patient Education  2019 [employer].

## 2018-03-31 ENCOUNTER — Encounter: Payer: Self-pay | Admitting: Family Medicine

## 2018-03-31 ENCOUNTER — Other Ambulatory Visit: Payer: Self-pay | Admitting: Family Medicine

## 2018-04-01 ENCOUNTER — Other Ambulatory Visit: Payer: Self-pay | Admitting: *Deleted

## 2018-04-01 MED ORDER — ATORVASTATIN CALCIUM 10 MG PO TABS: 10.0000 mg | ORAL_TABLET | Freq: Every evening | ORAL | 0 refills | Status: DC

## 2018-04-02 ENCOUNTER — Ambulatory Visit (INDEPENDENT_AMBULATORY_CARE_PROVIDER_SITE_OTHER): Payer: Medicare Other | Admitting: Psychology

## 2018-04-02 DIAGNOSIS — F4323 Adjustment disorder with mixed anxiety and depressed mood: Secondary | ICD-10-CM | POA: Diagnosis not present

## 2018-04-02 NOTE — Progress Notes (Signed)
Reviewed AWV and agree with documented care plan.

## 2018-04-19 ENCOUNTER — Ambulatory Visit (INDEPENDENT_AMBULATORY_CARE_PROVIDER_SITE_OTHER): Payer: Medicare Other | Admitting: Psychology

## 2018-04-19 ENCOUNTER — Other Ambulatory Visit: Payer: Self-pay

## 2018-04-19 DIAGNOSIS — F4323 Adjustment disorder with mixed anxiety and depressed mood: Secondary | ICD-10-CM

## 2018-05-09 ENCOUNTER — Ambulatory Visit: Payer: Medicare Other | Admitting: Internal Medicine

## 2018-05-10 ENCOUNTER — Ambulatory Visit (INDEPENDENT_AMBULATORY_CARE_PROVIDER_SITE_OTHER): Payer: Medicare Other | Admitting: Psychology

## 2018-05-10 DIAGNOSIS — F4323 Adjustment disorder with mixed anxiety and depressed mood: Secondary | ICD-10-CM | POA: Diagnosis not present

## 2018-05-23 ENCOUNTER — Ambulatory Visit (INDEPENDENT_AMBULATORY_CARE_PROVIDER_SITE_OTHER): Payer: Medicare Other | Admitting: Psychology

## 2018-05-23 DIAGNOSIS — F4323 Adjustment disorder with mixed anxiety and depressed mood: Secondary | ICD-10-CM

## 2018-06-13 ENCOUNTER — Ambulatory Visit (INDEPENDENT_AMBULATORY_CARE_PROVIDER_SITE_OTHER): Payer: Medicare Other | Admitting: Psychology

## 2018-06-13 DIAGNOSIS — F4323 Adjustment disorder with mixed anxiety and depressed mood: Secondary | ICD-10-CM | POA: Diagnosis not present

## 2018-06-20 DIAGNOSIS — R635 Abnormal weight gain: Secondary | ICD-10-CM | POA: Diagnosis not present

## 2018-06-20 DIAGNOSIS — K5730 Diverticulosis of large intestine without perforation or abscess without bleeding: Secondary | ICD-10-CM | POA: Diagnosis not present

## 2018-06-20 DIAGNOSIS — R140 Abdominal distension (gaseous): Secondary | ICD-10-CM | POA: Diagnosis not present

## 2018-06-20 DIAGNOSIS — Z86010 Personal history of colonic polyps: Secondary | ICD-10-CM | POA: Diagnosis not present

## 2018-06-20 DIAGNOSIS — K5190 Ulcerative colitis, unspecified, without complications: Secondary | ICD-10-CM | POA: Diagnosis not present

## 2018-06-24 ENCOUNTER — Ambulatory Visit: Payer: Self-pay | Admitting: Family Medicine

## 2018-06-27 ENCOUNTER — Encounter: Payer: Self-pay | Admitting: Family Medicine

## 2018-06-27 ENCOUNTER — Ambulatory Visit (INDEPENDENT_AMBULATORY_CARE_PROVIDER_SITE_OTHER): Payer: Medicare Other | Admitting: Family Medicine

## 2018-06-27 ENCOUNTER — Other Ambulatory Visit: Payer: Self-pay

## 2018-06-27 VITALS — BP 121/65 | HR 79 | Temp 98.0°F | Ht 61.0 in | Wt 204.0 lb

## 2018-06-27 DIAGNOSIS — E1169 Type 2 diabetes mellitus with other specified complication: Secondary | ICD-10-CM | POA: Diagnosis not present

## 2018-06-27 DIAGNOSIS — M170 Bilateral primary osteoarthritis of knee: Secondary | ICD-10-CM

## 2018-06-27 DIAGNOSIS — Z6835 Body mass index (BMI) 35.0-35.9, adult: Secondary | ICD-10-CM | POA: Diagnosis not present

## 2018-06-27 DIAGNOSIS — M65331 Trigger finger, right middle finger: Secondary | ICD-10-CM | POA: Diagnosis not present

## 2018-06-27 DIAGNOSIS — E1159 Type 2 diabetes mellitus with other circulatory complications: Secondary | ICD-10-CM

## 2018-06-27 DIAGNOSIS — E782 Mixed hyperlipidemia: Secondary | ICD-10-CM | POA: Diagnosis not present

## 2018-06-27 DIAGNOSIS — I10 Essential (primary) hypertension: Secondary | ICD-10-CM | POA: Diagnosis not present

## 2018-06-27 DIAGNOSIS — E119 Type 2 diabetes mellitus without complications: Secondary | ICD-10-CM

## 2018-06-27 DIAGNOSIS — M222X1 Patellofemoral disorders, right knee: Secondary | ICD-10-CM | POA: Diagnosis not present

## 2018-06-27 DIAGNOSIS — Z23 Encounter for immunization: Secondary | ICD-10-CM | POA: Insufficient documentation

## 2018-06-27 LAB — POCT GLYCOSYLATED HEMOGLOBIN (HGB A1C): Hemoglobin A1C: 6.5 % — AB (ref 4.0–5.6)

## 2018-06-27 NOTE — Progress Notes (Signed)
Subjective  CC:  Chief Complaint  Patient presents with  . Diabetes  . Osteoarthritis    HPI: Michelle Sherman is a 68 y.o. female who presents to the office today for follow up of diabetes and problems listed above in the chief complaint.   Diabetes follow up: 57-monthfollow-up for well-controlled diabetes. Her diabetic control is reported as Unchanged.  She denies symptoms of hyperglycemia.  She has put on a few pounds part due to the state home coronavirus pandemic restrictions.  Exercises been less as well. She denies exertional CP or SOB or symptomatic hypoglycemia. She denies foot sores or paresthesias.   Hypertension follow-up: Has been well controlled.  Takes medications without adverse effects.  No chest pain, shortness of breath.  No lower extremity edema.  Hyperlipidemia is at goal on statin  Complaints of middle finger right hand sometimes locking or nodule at base of that finger.  No trauma.  Symptoms have been mild for several months but more recently has noted a larger more tender nodule.  No redness, warmth or drainage.  No weakness in the extremity.  Bilateral knee osteoarthritis by x-rays in 2019 status post physical therapy.  He has been doing very well but now noticing some medial right knee pain, worse with prolonged sitting or climbing stairs.  No swelling.  No locking or give way symptoms.  She is never had Synvisc or steroid injections.  Wt Readings from Last 3 Encounters:  06/27/18 204 lb (92.5 kg)  03/27/18 202 lb 4 oz (91.7 kg)  12/24/17 196 lb (88.9 kg)    BP Readings from Last 3 Encounters:  06/27/18 121/65  03/27/18 122/60  12/24/17 118/80    Assessment  1. Controlled type 2 diabetes mellitus without complication, without long-term current use of insulin ([facility]   2. Hypertension associated with diabetes ([facility]   3. Combined hyperlipidemia associated with type 2 diabetes mellitus ([place] [facility]   4. Severe obesity (BMI 35.0-35.9 with comorbidity) (HCC) Chronic   5. Trigger middle finger of right hand   6. Patellofemoral pain syndrome of right knee   7. Primary osteoarthritis of both knees      Plan   Diabetes is currently well controlled.  Continue same medications.  Will recheck in 6 months.  Continue diabetic diet, increased exercise levels, follow-up for eye exam next month.  Hypertension: Well-controlled  Trigger finger: Educated on etiology and prognosis.  Mild symptoms now.  Return for steroid injection if worsening.  Also could be diabetic contracture, will refer to hand surgeon if needed  Osteoarthritis patellofemoral syndrome: Educated and counseled etiology and treatment options.  Recommend ice therapy 3 times daily for the next week, Voltaren gel as needed.  Strengthening exercises demonstrated.  She will follow-up with me if worsening.  Consider Synvisc injections.  She would be a good candidate.  Health maintenance: Patient scheduled to get her Shingrix vaccinations but they were deferred due to COVID-19 restrictions.  Follow up: 6 months for complete physical with follow-up of chronic medical conditions including diabetes. Orders Placed This Encounter  Procedures  . POCT glycosylated hemoglobin (Hb A1C)   No orders of the defined types were placed in this encounter.     Immunization History  Administered Date(s) Administered  . Influenza, High Dose Seasonal PF 12/06/2015, 11/01/2016, 09/21/2017  . Pneumococcal Conjugate-13 10/22/2014  . Pneumococcal Polysaccharide-23 10/05/2011, 12/21/2016  . Tdap 10/04/2010  . Zoster 10/03/2009    Diabetes Related Lab Review: Lab Results  Component Value Date  HGBA1C 6.5 (A) 06/27/2018   HGBA1C 6.3 (A) 12/24/2017   HGBA1C 6.0 (A) 06/19/2017    Lab Results  Component Value Date   MICROALBUR 0.7 12/24/2017   Lab Results  Component Value Date   CREATININE 0.64 12/24/2017   BUN 17 12/24/2017   NA 138 12/24/2017   K 4.5 12/24/2017   CL 101 12/24/2017   CO2 28 12/24/2017    Lab Results  Component Value Date   CHOL 146 12/24/2017   CHOL 144 11/01/2016   Lab Results  Component Value Date   HDL 75.80 12/24/2017   HDL 65.50 11/01/2016   Lab Results  Component Value Date   LDLCALC 53 12/24/2017   LDLCALC 47 11/01/2016   Lab Results  Component Value Date   TRIG 88.0 12/24/2017   TRIG 158.0 (H) 11/01/2016   Lab Results  Component Value Date   CHOLHDL 2 12/24/2017   CHOLHDL 2 11/01/2016   No results found for: LDLDIRECT The 10-year ASCVD risk score Mikey Bussing DC Jr., et al., 2013) is: 13.9%   Values used to calculate the score:     Age: 65 years     Sex: Female     Is Non-Hispanic African American: No     Diabetic: Yes     Tobacco smoker: No     Systolic Blood Pressure: 983 mmHg     Is BP treated: Yes     HDL Cholesterol: 75.8 mg/dL     Total Cholesterol: 146 mg/dL I have reviewed the PMH, Fam and Soc history. Patient Active Problem List   Diagnosis Date Noted  . Controlled type 2 diabetes mellitus without complication, without long-term current use of insulin ([place]) 11/01/2016    Priority: High    Overview:  Declined nutrition consult - due to lack of insurance coverage for this service 01/2013   . Hypertension associated with diabetes ([place]) 11/01/2016    Priority: High  . Severe obesity (BMI 35.0-35.9 with comorbidity) ([place]) 11/01/2016    Priority: High  . Combined hyperlipidemia associated with type 2 diabetes mellitus ([address]) 01/15/2012    Priority: High    Overview:  Goal LDL < 100   . Ulcerative colitis without complications ([place]) 38/25/0539    Priority: High    Overview:  No NSAIDS   . Acquired hypothyroidism 10/04/2010    Priority: High  . OSA on CPAP 06/28/2010    Priority: High    NPSG 06/04/06- AHI ? 23/ hr, desaturation to 66%, CPAP titrated to 8, body weight 242 lbs.   . Diverticulosis of colon 10/17/2010    Priority: Medium  . Osteoarthrosis of knee 10/04/2010    Priority: Medium    Discussed synvisc 12/2017; has had PT.  Had imaging - mild to moderate OA 2019   . 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 Allergic rhinitis 10/05/2011    Priority: Low  . Internal hemorrhoids 10/17/2010    Priority: Low  . Need for shingles vaccine 06/27/2018    Gave RX twice 02/2017 and 03/2018   . Plantar fasciitis 05/06/2017    Social History: Patient  reports that she has never smoked. She has never used smokeless tobacco. She reports current alcohol use. She reports that she does not use drugs.  Review of Systems: Ophthalmic: negative for eye pain, loss of vision or double vision Cardiovascular: negative for chest pain Respiratory: negative for SOB or persistent cough Gastrointestinal: negative for abdominal pain Genitourinary: negative for dysuria or gross hematuria MSK: negative  for foot lesions Neurologic: negative for weakness or gait disturbance  Objective  Vitals: BP 121/65 (BP Location: Right Arm, Patient Position: Sitting, Cuff Size: Large)   Pulse 79   Temp 98 F (36.7 C) (Oral)   Ht 5' 1"  (1.549 m)   Wt 204 lb (92.5 kg)   SpO2 100%   BMI 38.55 kg/m  General: well appearing, no acute distress  Psych:  Alert and oriented, normal mood and affect HEENT:  Normocephalic, atraumatic, moist mucous membranes, supple neck  Cardiovascular:  Nl S1 and S2, RRR without murmur, gallop or rub. no edema Respiratory:  Good breath sounds bilaterally, CTAB with normal effort, no rales Gastrointestinal: normal BS, soft, nontender Skin:  Warm, no rashes Neurologic:   Mental status is normal. normal gait Foot exam: no erythema, pallor, or cyanosis visible nl proprioception and sensation to monofilament testing bilaterally, +2 distal pulses bilaterally Bilateral knees with crepitus, no effusions, full range of motion, medial tenderness and suprapatellar tenderness and right knee.  No joint line tenderness, normal gait Right middle finger with tender nodule over Mcp with mild triggering    Diabetic  education: ongoing education regarding chronic disease management for diabetes was given today. We continue to reinforce the ABC's of diabetic management: A1c (<7 or 8 dependent upon patient), tight blood pressure control, and cholesterol management with goal LDL < 100 minimally. We discuss diet strategies, exercise recommendations, medication options and possible side effects. At each visit, we review recommended immunizations and preventive care recommendations for diabetics and stress that good diabetic control can prevent other problems. See below for this patient's data.    Commons side effects, risks, benefits, and alternatives for medications and treatment plan prescribed today were discussed, and the patient expressed understanding of the given instructions. Patient is instructed to call or message via MyChart if he/she has any questions or concerns regarding our treatment plan. No barriers to understanding were identified. We discussed Red Flag symptoms and signs in detail. Patient expressed understanding regarding what to do in case of urgent or emergency type symptoms.   Medication list was reconciled, printed and provided to the patient in AVS. Patient instructions and summary information was reviewed with the patient as documented in the AVS. This note was prepared with assistance of Dragon voice recognition software. Occasional wrong-word or sound-a-like substitutions may have occurred due to the inherent limitations of voice recognition software

## 2018-06-27 NOTE — Patient Instructions (Signed)
Please return in 6 months for complete physical and diabetes follow up  If you have any questions or concerns, please don't hesitate to send me a message via MyChart or call the office at [phone]. Thank you for visiting with [place] today! It's our pleasure caring for you.   Patellofemoral Pain Syndrome  Patellofemoral pain syndrome is a condition in which the tissue (cartilage) on the underside of the kneecap (patella) softens or breaks down. This causes pain in the front of the knee. The condition is also called runner's knee or chondromalacia patella. Patellofemoral pain syndrome is most common in young adults who are active in sports. The knee is the largest joint in the body. The patella covers the front of the knee and is attached to muscles above and below the knee. The underside of the patella is covered with a smooth type of cartilage (synovium). The smooth surface helps the patella to glide easily when you move your knee. Patellofemoral pain syndrome causes swelling in the joint linings and bone surfaces in the knee. What are the causes? This condition may be caused by:  Overuse of the knee.  Poor alignment of your knee joints.  Weak leg muscles.  A direct blow to your kneecap. What increases the risk? You are more likely to develop this condition if:  You do a lot of activities that can wear down your kneecap. These include: ? Running. ? Squatting. ? Climbing stairs.  You start a new physical activity or exercise program.  You wear shoes that do not fit well.  You do not have good leg strength.  You are overweight. What are the signs or symptoms? The main symptom of this condition is knee pain. This may feel like a dull, aching pain underneath your patella, in the front of your knee. There may be a popping or cracking sound when you move your knee. Pain may get worse with:  Exercise.  Climbing stairs.  Running.  Jumping.  Squatting.  Kneeling.  Sitting for  a long time.  Moving or pushing on your patella. How is this diagnosed? This condition may be diagnosed based on:  Your symptoms and medical history. You may be asked about your recent physical activities and which ones cause knee pain.  A physical exam. This may include: ? Moving your patella back and forth. ? Checking your range of knee motion. ? Having you squat or jump to see if you have pain. ? Checking the strength of your leg muscles.  Imaging tests to confirm the diagnosis. These may include an MRI of your knee. How is this treated? This condition may be treated at home with rest, ice, compression, and elevation (RICE).  Other treatments may include:  Nonsteroidal anti-inflammatory drugs (NSAIDs).  Physical therapy to stretch and strengthen your leg muscles.  Shoe inserts (orthotics) to take stress off your knee.  A knee brace or knee support.  Adhesive tapes to the skin.  Surgery to remove damaged cartilage or move the patella to a better position. This is rare. Follow these instructions at home: If you have a shoe or brace:  Wear the shoe or brace as told by your health care provider. Remove it only as told by your health care provider.  Loosen the shoe or brace if your toes tingle, become numb, or turn cold and blue.  Keep the shoe or brace clean.  If the shoe or brace is not waterproof: ? Do not let it get wet. ? Cover it  with a watertight covering when you take a bath or a shower. Managing pain, stiffness, and swelling  If directed, put ice on the painful area. ? If you have a removable shoe or brace, remove it as told by your health care provider. ? Put ice in a plastic bag. ? Place a towel between your skin and the bag. ? Leave the ice on for 20 minutes, 2-3 times a day.  Move your toes often to avoid stiffness and to lessen swelling.  Rest your knee: ? Avoid activities that cause knee pain. ? When sitting or lying down, raise (elevate) the injured  area above the level of your heart, whenever possible. General instructions  Take over-the-counter and prescription medicines only as told by your health care provider.  Use splints, braces, knee supports, or walking aids as directed by your health care provider.  Perform stretching and strengthening exercises as told by your health care provider or physical therapist.  Do not use any products that contain nicotine or tobacco, such as cigarettes and e-cigarettes. These can delay healing. If you need help quitting, ask your health care provider.  Return to your normal activities as told by your health care provider. Ask your health care provider what activities are safe for you.  Keep all follow-up visits as told by your health care provider. This is important. Contact a health care provider if:  Your symptoms get worse.  You are not improving with home care. Summary  Patellofemoral pain syndrome is a condition in which the tissue (cartilage) on the underside of the kneecap (patella) softens or breaks down.  This condition causes swelling in the joint linings and bone surfaces in the knee. This leads to pain in the front of the knee.  This condition may be treated at home with rest, ice, compression, and elevation (RICE).  Use splints, braces, knee supports, or walking aids as directed by your health care provider. This information is not intended to replace advice given to you by your health care provider. Make sure you discuss any questions you have with your health care provider. Document Released: 12/28/2008 Document Revised: 02/19/2017 Document Reviewed: 02/19/2017 Elsevier Interactive Patient Education  2019 [place].  Trigger Finger  Trigger finger (stenosing tenosynovitis) is a condition that causes a finger to get stuck in a bent position. Each finger has a tough, cord-like tissue that connects muscle to bone (tendon), and each tendon is surrounded by a tunnel of tissue  (tendon sheath). To move your finger, your tendon needs to slide freely through the sheath. Trigger finger happens when the tendon or the sheath thickens, making it difficult to move your finger. Trigger finger can affect any finger or a thumb. It may affect more than one finger. Mild cases may clear up with rest and medicine. Severe cases require more treatment. What are the causes? Trigger finger is caused by a thickened finger tendon or tendon sheath. The cause of this thickening is not known. What increases the risk? The following factors may make you more likely to develop this condition:  Doing activities that require a strong grip.  Having rheumatoid arthritis, gout, or diabetes.  Being 71-34 years old.  Being a woman. What are the signs or symptoms? Symptoms of this condition include:  Pain when bending or straightening your finger.  Tenderness or swelling where your finger attaches to the palm of your hand.  A lump in the palm of your hand or on the inside of your finger.  Hearing a popping sound when you try to straighten your finger.  Feeling a popping, catching, or locking sensation when you try to straighten your finger.  Being unable to straighten your finger. How is this diagnosed? This condition is diagnosed based on your symptoms and a physical exam. How is this treated? This condition may be treated by:  Resting your finger and avoiding activities that make symptoms worse.  Wearing a finger splint to keep your finger in a slightly bent position.  Taking NSAIDs to relieve pain and swelling.  Injecting medicine (steroids) into the tendon sheath to reduce swelling and irritation. Injections may need to be repeated.  Having surgery to open the tendon sheath. This may be done if other treatments do not work and you cannot straighten your finger. You may need physical therapy after surgery. Follow these instructions at home:   Use moist heat to help reduce  pain and swelling as told by your health care provider.  Rest your finger and avoid activities that make pain worse. Return to normal activities as told by your health care provider.  If you have a splint, wear it as told by your health care provider.  Take over-the-counter and prescription medicines only as told by your health care provider.  Keep all follow-up visits as told by your health care provider. This is important. Contact a health care provider if:  Your symptoms are not improving with home care. Summary  Trigger finger (stenosing tenosynovitis) causes your finger to get stuck in a bent position, and it can make it difficult and painful to straighten your finger.  This condition develops when a finger tendon or tendon sheath thickens.  Treatment starts with resting, wearing a splint, and taking NSAIDs.  In severe cases, surgery to open the tendon sheath may be needed. This information is not intended to replace advice given to you by your health care provider. Make sure you discuss any questions you have with your health care provider. Document Released: 10/30/2003 Document Revised: 12/21/2015 Document Reviewed: 12/21/2015 Elsevier Interactive Patient Education  2019 [employer].

## 2018-06-30 ENCOUNTER — Other Ambulatory Visit: Payer: Self-pay | Admitting: Family Medicine

## 2018-07-01 MED ORDER — ATORVASTATIN CALCIUM 10 MG PO TABS: 10.0000 mg | ORAL_TABLET | Freq: Every evening | ORAL | 0 refills | Status: DC

## 2018-07-04 ENCOUNTER — Other Ambulatory Visit: Payer: Self-pay | Admitting: Family Medicine

## 2018-07-04 DIAGNOSIS — Z1231 Encounter for screening mammogram for malignant neoplasm of breast: Secondary | ICD-10-CM

## 2018-07-08 ENCOUNTER — Ambulatory Visit (INDEPENDENT_AMBULATORY_CARE_PROVIDER_SITE_OTHER): Payer: Medicare Other | Admitting: Psychology

## 2018-07-08 DIAGNOSIS — F4323 Adjustment disorder with mixed anxiety and depressed mood: Secondary | ICD-10-CM

## 2018-07-08 MED ORDER — RAMIPRIL 10 MG PO CAPS: 10.0000 mg | ORAL_CAPSULE | Freq: Every day | ORAL | 3 refills | Status: DC

## 2018-07-08 MED ORDER — METFORMIN HCL 1000 MG PO TABS: 1000.0000 mg | ORAL_TABLET | Freq: Two times a day (BID) | ORAL | 3 refills | Status: DC

## 2018-07-08 MED ORDER — ATORVASTATIN CALCIUM 10 MG PO TABS: 10.0000 mg | ORAL_TABLET | Freq: Every evening | ORAL | 3 refills | Status: DC

## 2018-07-08 NOTE — Telephone Encounter (Signed)
Pt needs atorvastatin (LIPITOR) 10 MG tablet To go to  [facility] [place], [place] - [address] (Phone) [phone] (Fax)   The rest of the meds are fine going to [facility]/ please advise

## 2018-07-08 NOTE — Telephone Encounter (Signed)
Pt aware Rxs refill

## 2018-07-08 NOTE — Telephone Encounter (Signed)
See note

## 2018-07-08 NOTE — Telephone Encounter (Signed)
Can meds be refilled for 1 year?

## 2018-07-08 NOTE — Telephone Encounter (Signed)
Patient would a call back to find out why her medications were filled with 0 refills and she usually gets a year supply of refills?

## 2018-07-08 NOTE — Addendum Note (Signed)
Addended by: Layla Barter on: 07/08/2018 03:24 PM   Modules accepted: Orders

## 2018-07-08 NOTE — Telephone Encounter (Signed)
Yes. I'm not sure who is refilling meds with 0 refills? Is that the protocol here? As you know, I prefer 1 year supply and will note otherwise if needed.

## 2018-07-09 MED ORDER — ATORVASTATIN CALCIUM 10 MG PO TABS: 10.0000 mg | ORAL_TABLET | Freq: Every evening | ORAL | 3 refills | Status: DC

## 2018-07-09 NOTE — Telephone Encounter (Signed)
RX sent to [facility]

## 2018-07-09 NOTE — Addendum Note (Signed)
Addended by: Layla Barter on: 07/09/2018 07:56 AM   Modules accepted: Orders

## 2018-07-09 NOTE — Telephone Encounter (Signed)
See request °

## 2018-07-18 ENCOUNTER — Encounter: Payer: Self-pay | Admitting: Internal Medicine

## 2018-07-18 ENCOUNTER — Other Ambulatory Visit: Payer: Self-pay

## 2018-07-18 ENCOUNTER — Ambulatory Visit (INDEPENDENT_AMBULATORY_CARE_PROVIDER_SITE_OTHER): Payer: Medicare Other | Admitting: Internal Medicine

## 2018-07-18 VITALS — BP 100/60 | HR 77 | Temp 98.1°F | Ht 60.0 in | Wt 204.6 lb

## 2018-07-18 DIAGNOSIS — Z9989 Dependence on other enabling machines and devices: Secondary | ICD-10-CM | POA: Diagnosis not present

## 2018-07-18 DIAGNOSIS — G4733 Obstructive sleep apnea (adult) (pediatric): Secondary | ICD-10-CM | POA: Diagnosis not present

## 2018-07-18 DIAGNOSIS — Z6835 Body mass index (BMI) 35.0-35.9, adult: Secondary | ICD-10-CM

## 2018-07-18 NOTE — Progress Notes (Signed)
HPI female never smoker followed for OSA complicated by hypothyroid, DM 2, hyperlipidemia, allergic rhinitis, HBP, ulcerative colitis, morbid obesity NPSG 06/04/06- AHI ? 23/ hr, desaturation to 66%, CPAP titrated to 8, body weight 242 lbs HST 02/05/17-AHI 23.4/hour, desaturation to 86%, body weight 192 pounds -----------------------------------------------------------------------------  05/07/17-68 year old female never smoker followed for OSA complicated by hypothyroid, DM 2, hyperlipidemia, allergic rhinitis, HBP, ulcerative colitis, morbid obesity HST 02/05/17-AHI 23.4/hour, desaturation to 86%, body weight 192 pounds CPAP auto 5-12/[facility]>> today 5-15 ----OSA: DME [facility]. Pt wears CPAP nightly; DL attached Pt states she had a heated hose previously and has to shake water out of her tubing-will talk with [facility].  Download 100% compliance AHI 1.0/hour New/replacement machine.  Cannot sleep without CPAP.  Humidity is condensing in hose but she is bothered by dry mouth. She denies other active health concerns for this visit.  07/18/2018- 68 year old female never smoker followed for OSA complicated by hypothyroid, DM 2, hyperlipidemia, allergic rhinitis, HBP, ulcerative colitis, morbid obesity CPAP auto 5-12/ [facility] Download 100% compliance, AHI 0.7/ hr Body weight today 204 lbs -----OSA on CPAP auto 5-15, DME: [facility]; recently got new mask, inquires about oral appliance. She asks about alternatives to CPAP- discussed.  Denies acute events or major health changes.   Marland KitchenROS-see HPI   + = positive Constitutional:    weight loss, night sweats, fevers, chills, fatigue, lassitude. HEENT:    headaches, difficulty swallowing, tooth/dental problems, sore throat,       sneezing, itching, ear ache, nasal congestion, post nasal drip, snoring CV:    chest pain, orthopnea, PND, swelling in lower extremities, anasarca,                                  dizziness, palpitations Resp:   shortness of breath with exertion  or at rest.                productive cough,   non-productive cough, coughing up of blood.              change in color of mucus.  wheezing.   Skin:    rash or lesions. GI:  No-   heartburn, indigestion, abdominal pain, nausea, vomiting, diarrhea,                 change in bowel habits, loss of appetite GU: dysuria, change in color of urine, no urgency or frequency.   flank pain. MS:   joint pain, +stiffness, decreased range of motion, back pain. Neuro-     nothing unusual Psych:  change in mood or affect.  depression or anxiety.   memory loss.  OBJ- Physical Exam General- Alert, Oriented, Affect-appropriate, Distress- none acute, + obese Skin- rash-none, lesions- none, excoriation- none Lymphadenopathy- none Head- atraumatic            Eyes- Gross vision intact, PERRLA, conjunctivae and secretions clear            Ears- Hearing, canals-normal            Nose- Clear, no-Septal dev, mucus, polyps, erosion, perforation             Throat- Mallampati II-III , mucosa clear , drainage- none, tonsils- atrophic Neck- flexible , trachea midline, no stridor , thyroid nl, carotid no bruit Chest - symmetrical excursion , unlabored           Heart/CV- RRR , no murmur , no gallop  , no  rub, nl s1 s2                           - JVD- none , edema- none, stasis changes- none, varices- none           Lung-    clear, wheeze- none, cough- none , dullness-none, rub- none           Chest wall-  Abd-  Br/ Gen/ Rectal- Not done, not indicated Extrem- cyanosis- none, clubbing, none, atrophy- none, strength- nl Neuro- grossly intact to observation

## 2018-07-18 NOTE — Patient Instructions (Signed)
We can continue CPAP auto 5-12, mask of choice, humidifier, supplies, [employer]/ card  Order- referral to Orthodontist Dr Oneal Grout-  Consider oral appliance for OSA  Please call if we can help

## 2018-07-19 ENCOUNTER — Other Ambulatory Visit: Payer: Self-pay | Admitting: General Surgery

## 2018-07-19 ENCOUNTER — Encounter: Payer: Self-pay | Admitting: Family Medicine

## 2018-07-19 NOTE — Telephone Encounter (Signed)
Please get set up for synvisc.  Then schedule appt.  bilateral

## 2018-07-19 NOTE — Assessment & Plan Note (Signed)
She has not made the life style changes necessary to accomplish meaningful weight loss. Encouraged. Consider Bariatric referral.

## 2018-07-19 NOTE — Assessment & Plan Note (Signed)
She has benefited from CPAP with improved sleep and download confirms good compliance and control. As she considers long term use, she would like to know about oral appliance alternatives. Plan- referral to Dr Ron Parker to consider oral appliance alternative to CPAP.

## 2018-08-02 ENCOUNTER — Telehealth: Payer: Self-pay | Admitting: *Deleted

## 2018-08-02 NOTE — Telephone Encounter (Addendum)
Called to verify benefits for Synvisc, pt is eligible for services and CPT code [id number] is billable. Will get medication ordered and sent to office then call pt to schedule. Call reference number = [id number].

## 2018-08-07 ENCOUNTER — Encounter: Payer: Self-pay | Admitting: *Deleted

## 2018-08-20 ENCOUNTER — Other Ambulatory Visit: Payer: Self-pay

## 2018-08-20 ENCOUNTER — Ambulatory Visit
Admission: RE | Admit: 2018-08-20 | Discharge: 2018-08-20 | Disposition: A | Discharge status: Home / Self Care | Payer: Medicare Other | Source: Ambulatory Visit | Attending: Family Medicine | Admitting: Family Medicine

## 2018-08-20 DIAGNOSIS — Z1231 Encounter for screening mammogram for malignant neoplasm of breast: Secondary | ICD-10-CM

## 2018-09-08 ENCOUNTER — Encounter: Payer: Self-pay | Admitting: Family Medicine

## 2018-09-09 MED ORDER — ATORVASTATIN CALCIUM 10 MG PO TABS: 10.0000 mg | ORAL_TABLET | Freq: Every evening | ORAL | 3 refills | Status: DC

## 2018-10-25 DIAGNOSIS — Z23 Encounter for immunization: Secondary | ICD-10-CM | POA: Diagnosis not present

## 2018-11-28 DIAGNOSIS — H5213 Myopia, bilateral: Secondary | ICD-10-CM | POA: Diagnosis not present

## 2018-11-28 DIAGNOSIS — E119 Type 2 diabetes mellitus without complications: Secondary | ICD-10-CM | POA: Diagnosis not present

## 2018-11-28 DIAGNOSIS — H2513 Age-related nuclear cataract, bilateral: Secondary | ICD-10-CM | POA: Diagnosis not present

## 2018-11-28 LAB — HM DIABETES EYE EXAM

## 2018-12-03 ENCOUNTER — Other Ambulatory Visit: Payer: Self-pay

## 2018-12-03 ENCOUNTER — Encounter: Payer: Self-pay | Admitting: Family Medicine

## 2018-12-03 MED ORDER — CANAGLIFLOZIN 300 MG PO TABS: 300.0000 mg | ORAL_TABLET | Freq: Every day | ORAL | 3 refills | Status: DC

## 2018-12-06 ENCOUNTER — Other Ambulatory Visit: Payer: Self-pay | Admitting: *Deleted

## 2018-12-31 ENCOUNTER — Other Ambulatory Visit: Payer: Self-pay

## 2019-01-01 ENCOUNTER — Ambulatory Visit (INDEPENDENT_AMBULATORY_CARE_PROVIDER_SITE_OTHER): Payer: Medicare Other | Admitting: Family Medicine

## 2019-01-01 ENCOUNTER — Encounter: Payer: Self-pay | Admitting: Family Medicine

## 2019-01-01 VITALS — BP 116/70 | HR 65 | Temp 97.6°F | Ht 60.0 in | Wt 209.0 lb

## 2019-01-01 DIAGNOSIS — E782 Mixed hyperlipidemia: Secondary | ICD-10-CM

## 2019-01-01 DIAGNOSIS — E039 Hypothyroidism, unspecified: Secondary | ICD-10-CM | POA: Diagnosis not present

## 2019-01-01 DIAGNOSIS — E559 Vitamin D deficiency, unspecified: Secondary | ICD-10-CM | POA: Diagnosis not present

## 2019-01-01 DIAGNOSIS — Z6835 Body mass index (BMI) 35.0-35.9, adult: Secondary | ICD-10-CM

## 2019-01-01 DIAGNOSIS — E119 Type 2 diabetes mellitus without complications: Secondary | ICD-10-CM | POA: Diagnosis not present

## 2019-01-01 DIAGNOSIS — E1169 Type 2 diabetes mellitus with other specified complication: Secondary | ICD-10-CM

## 2019-01-01 DIAGNOSIS — I152 Hypertension secondary to endocrine disorders: Secondary | ICD-10-CM

## 2019-01-01 DIAGNOSIS — N812 Incomplete uterovaginal prolapse: Secondary | ICD-10-CM | POA: Diagnosis not present

## 2019-01-01 DIAGNOSIS — I10 Essential (primary) hypertension: Secondary | ICD-10-CM

## 2019-01-01 DIAGNOSIS — E1159 Type 2 diabetes mellitus with other circulatory complications: Secondary | ICD-10-CM

## 2019-01-01 DIAGNOSIS — K5190 Ulcerative colitis, unspecified, without complications: Secondary | ICD-10-CM | POA: Diagnosis not present

## 2019-01-01 LAB — LIPID PANEL
Cholesterol: 153 mg/dL (ref 0–200)
HDL: 64.6 mg/dL (ref >39.00)
LDL Cholesterol: 55 mg/dL (ref 0–99)
NonHDL: 88.79
Total CHOL/HDL Ratio: 2
Triglycerides: 169 mg/dL — ABNORMAL HIGH (ref 0.0–149.0)
VLDL: 33.8 mg/dL (ref 0.0–40.0)

## 2019-01-01 LAB — COMPREHENSIVE METABOLIC PANEL
ALT: 15 U/L (ref 0–35)
AST: 13 U/L (ref 0–37)
Albumin: 4.2 g/dL (ref 3.5–5.2)
Alkaline Phosphatase: 81 U/L (ref 39–117)
BUN: 14 mg/dL (ref 6–23)
CO2: 29 mEq/L (ref 19–32)
Calcium: 9.9 mg/dL (ref 8.4–10.5)
Chloride: 101 mEq/L (ref 96–112)
Creatinine, Ser: 0.6 mg/dL (ref 0.40–1.20)
GFR: 99.01 mL/min (ref >60.00)
Glucose, Bld: 140 mg/dL — ABNORMAL HIGH (ref 70–99)
Potassium: 4.3 mEq/L (ref 3.5–5.1)
Sodium: 138 mEq/L (ref 135–145)
Total Bilirubin: 0.5 mg/dL (ref 0.2–1.2)
Total Protein: 6.9 g/dL (ref 6.0–8.3)

## 2019-01-01 LAB — CBC WITH DIFFERENTIAL/PLATELET
Basophils Absolute: 0.1 10*3/uL (ref 0.0–0.1)
Basophils Relative: 0.8 % (ref 0.0–3.0)
Eosinophils Absolute: 0.2 10*3/uL (ref 0.0–0.7)
Eosinophils Relative: 3.3 % (ref 0.0–5.0)
HCT: 44.3 % (ref 36.0–46.0)
Hemoglobin: 14.6 g/dL (ref 12.0–15.0)
Lymphocytes Relative: 28.3 % (ref 12.0–46.0)
Lymphs Abs: 1.9 10*3/uL (ref 0.7–4.0)
MCHC: 32.9 g/dL (ref 30.0–36.0)
MCV: 89.7 fl (ref 78.0–100.0)
Monocytes Absolute: 0.4 10*3/uL (ref 0.1–1.0)
Monocytes Relative: 6.6 % (ref 3.0–12.0)
Neutro Abs: 4.1 10*3/uL (ref 1.4–7.7)
Neutrophils Relative %: 61 % (ref 43.0–77.0)
Platelets: 289 10*3/uL (ref 150.0–400.0)
RBC: 4.94 Mil/uL (ref 3.87–5.11)
RDW: 13.8 % (ref 11.5–15.5)
WBC: 6.7 10*3/uL (ref 4.0–10.5)

## 2019-01-01 LAB — MICROALBUMIN / CREATININE URINE RATIO
Creatinine,U: 59.9 mg/dL
Microalb Creat Ratio: 1.2 mg/g (ref 0.0–30.0)
Microalb, Ur: <0.7 mg/dL (ref 0.0–1.9)

## 2019-01-01 LAB — VITAMIN D 25 HYDROXY (VIT D DEFICIENCY, FRACTURES): VITD: 56.65 ng/mL (ref 30.00–100.00)

## 2019-01-01 LAB — TSH: TSH: 1.73 u[IU]/mL (ref 0.35–4.50)

## 2019-01-01 LAB — POCT GLYCOSYLATED HEMOGLOBIN (HGB A1C): Hemoglobin A1C: 6.6 % — AB (ref 4.0–5.6)

## 2019-01-01 NOTE — Patient Instructions (Addendum)
Please return in 3 months for diabetes follow up  I will release your lab results to you on your MyChart account with further instructions. Please reply with any questions.  Start eating better to get your fastings < 120 again.   If you have any questions or concerns, please don't hesitate to send me a message via MyChart or call the office at [phone]. Thank you for visiting with [place] today! It's our pleasure caring for you.   Preventive Care 69 Years and Older, Female Preventive care refers to lifestyle choices and visits with your health care provider that can promote health and wellness. This includes:  A yearly physical exam. This is also called an annual well check.  Regular dental and eye exams.  Immunizations.  Screening for certain conditions.  Healthy lifestyle choices, such as diet and exercise. What can I expect for my preventive care visit? Physical exam Your health care provider will check:  Height and weight. These may be used to calculate body mass index (BMI), which is a measurement that tells if you are at a healthy weight.  Heart rate and blood pressure.  Your skin for abnormal spots. Counseling Your health care provider may ask you questions about:  Alcohol, tobacco, and drug use.  Emotional well-being.  Home and relationship well-being.  Sexual activity.  Eating habits.  History of falls.  Memory and ability to understand (cognition).  Work and work Statistician.  Pregnancy and menstrual history. What immunizations do I need?  Influenza (flu) vaccine  This is recommended every year. Tetanus, diphtheria, and pertussis (Tdap) vaccine  You may need a Td booster every 10 years. Varicella (chickenpox) vaccine  You may need this vaccine if you have not already been vaccinated. Zoster (shingles) vaccine  You may need this after age 86. Pneumococcal conjugate (PCV13) vaccine  One dose is recommended after age 89. Pneumococcal  polysaccharide (PPSV23) vaccine  One dose is recommended after age 17. Measles, mumps, and rubella (MMR) vaccine  You may need at least one dose of MMR if you were born in 1957 or later. You may also need a second dose. Meningococcal conjugate (MenACWY) vaccine  You may need this if you have certain conditions. Hepatitis A vaccine  You may need this if you have certain conditions or if you travel or work in places where you may be exposed to hepatitis A. Hepatitis B vaccine  You may need this if you have certain conditions or if you travel or work in places where you may be exposed to hepatitis B. Haemophilus influenzae type b (Hib) vaccine  You may need this if you have certain conditions. You may receive vaccines as individual doses or as more than one vaccine together in one shot (combination vaccines). Talk with your health care provider about the risks and benefits of combination vaccines. What tests do I need? Blood tests  Lipid and cholesterol levels. These may be checked every 5 years, or more frequently depending on your overall health.  Hepatitis C test.  Hepatitis B test. Screening  Lung cancer screening. You may have this screening every year starting at age 36 if you have a 30-pack-year history of smoking and currently smoke or have quit within the past 15 years.  Colorectal cancer screening. All adults should have this screening starting at age 30 and continuing until age 62. Your health care provider may recommend screening at age 21 if you are at increased risk. You will have tests every 1-10 years, depending on  your results and the type of screening test.  Diabetes screening. This is done by checking your blood sugar (glucose) after you have not eaten for a while (fasting). You may have this done every 1-3 years.  Mammogram. This may be done every 1-2 years. Talk with your health care provider about how often you should have regular mammograms.  BRCA-related  cancer screening. This may be done if you have a family history of breast, ovarian, tubal, or peritoneal cancers. Other tests  Sexually transmitted disease (STD) testing.  Bone density scan. This is done to screen for osteoporosis. You may have this done starting at age 87. Follow these instructions at home: Eating and drinking  Eat a diet that includes fresh fruits and vegetables, whole grains, lean protein, and low-fat dairy products. Limit your intake of foods with high amounts of sugar, saturated fats, and salt.  Take vitamin and mineral supplements as recommended by your health care provider.  Do not drink alcohol if your health care provider tells you not to drink.  If you drink alcohol: ? Limit how much you have to 0-1 drink a day. ? Be aware of how much alcohol is in your drink. In the [place] one drink equals one 12 oz bottle of beer (355 mL), one 5 oz glass of wine (148 mL), or one 1 oz glass of hard liquor (44 mL). Lifestyle  Take daily care of your teeth and gums.  Stay active. Exercise for at least 30 minutes on 5 or more days each week.  Do not use any products that contain nicotine or tobacco, such as cigarettes, e-cigarettes, and chewing tobacco. If you need help quitting, ask your health care provider.  If you are sexually active, practice safe sex. Use a condom or other form of protection in order to prevent STIs (sexually transmitted infections).  Talk with your health care provider about taking a low-dose aspirin or statin. What's next?  Go to your health care provider once a year for a well check visit.  Ask your health care provider how often you should have your eyes and teeth checked.  Stay up to date on all vaccines. This information is not intended to replace advice given to you by your health care provider. Make sure you discuss any questions you have with your health care provider. Document Released: 02/05/2015 Document Revised: 01/03/2018 Document  Reviewed: 01/03/2018 Elsevier Patient Education  2020 [employer].   Pelvic Organ Prolapse Pelvic organ prolapse is the stretching, bulging, or dropping of pelvic organs into an abnormal position. It happens when the muscles and tissues that surround and support pelvic structures become weak or stretched. Pelvic organ prolapse can involve the:  Vagina (vaginal prolapse).  Uterus (uterine prolapse).  Bladder (cystocele).  Rectum (rectocele).  Intestines (enterocele). When organs other than the vagina are involved, they often bulge into the vagina or protrude from the vagina, depending on how severe the prolapse is. What are the causes? This condition may be caused by:  Pregnancy, labor, and childbirth.  Past pelvic surgery.  Decreased production of the hormone estrogen associated with menopause.  Consistently lifting more than 50 lb (23 kg).  Obesity.  Long-term inability to pass stool (chronic constipation).  A cough that lasts a long time (chronic).  Buildup of fluid in the abdomen due to certain diseases and other conditions. What are the signs or symptoms? Symptoms of this condition include:  Passing a little urine (loss of bladder control) when you cough, sneeze, strain,  and exercise (stress incontinence). This may be worse immediately after childbirth. It may gradually improve over time.  Feeling pressure in your pelvis or vagina. This pressure may increase when you cough or when you are passing stool.  A bulge that protrudes from the opening of your vagina.  Difficulty passing urine or stool.  Pain in your lower back.  Pain, discomfort, or disinterest in sex.  Repeated bladder infections (urinary tract infections).  Difficulty inserting a tampon. In some people, this condition causes no symptoms. How is this diagnosed? This condition may be diagnosed based on a vaginal and rectal exam. During the exam, you may be asked to cough and strain while you are  lying down, sitting, and standing up. Your health care provider will determine if other tests are required, such as bladder function tests. How is this treated? Treatment for this condition may depend on your symptoms. Treatment may include:  Lifestyle changes, such as changes to your diet.  Emptying your bladder at scheduled times (bladder training therapy). This can help reduce or avoid urinary incontinence.  Estrogen. Estrogen may help mild prolapse by increasing the strength and tone of pelvic floor muscles.  Kegel exercises. These may help mild cases of prolapse by strengthening and tightening the muscles of the pelvic floor.  A soft, flexible device that helps support the vaginal walls and keep pelvic organs in place (pessary). This is inserted into your vagina by your health care provider.  Surgery. This is often the only form of treatment for severe prolapse. Follow these instructions at home:  Avoid drinking beverages that contain caffeine or alcohol.  Increase your intake of high-fiber foods. This can help decrease constipation and straining during bowel movements.  Lose weight if recommended by your health care provider.  Wear a sanitary pad or adult diapers if you have urinary incontinence.  Avoid heavy lifting and straining with exercise and work. Do not hold your breath when you perform mild to moderate lifting and exercise activities. Limit your activities as directed by your health care provider.  Do Kegel exercises as directed by your health care provider. To do this: ? Squeeze your pelvic floor muscles tight. You should feel a tight lift in your rectal area and a tightness in your vaginal area. Keep your stomach, buttocks, and legs relaxed. ? Hold the muscles tight for up to 10 seconds. ? Relax your muscles. ? Repeat this exercise 50 times a day, or as many times as told by your health care provider. Continue to do this exercise for at least 4-6 weeks, or for as long  as told by your health care provider.  Take over-the-counter and prescription medicines only as told by your health care provider.  If you have a pessary, take care of it as told by your health care provider.  Keep all follow-up visits as told by your health care provider. This is important. Contact a health care provider if you:  Have symptoms that interfere with your daily activities or sex life.  Need medicine to help with the discomfort.  Notice bleeding from your vagina that is not related to your period.  Have a fever.  Have pain or bleeding when you urinate.  Have bleeding when you pass stool.  Pass urine when you have sex.  Have chronic constipation.  Have a pessary that falls out.  Have bad smelling vaginal discharge.  Have an unusual, low pain in your abdomen. Summary  Pelvic organ prolapse is the stretching, bulging, or  dropping of pelvic organs into an abnormal position. It happens when the muscles and tissues that surround and support pelvic structures become weak or stretched.  When organs other than the vagina are involved, they often bulge into the vagina or protrude from the vagina, depending on how severe the prolapse is.  In most cases, this condition needs to be treated only if it produces symptoms. Treatment may include lifestyle changes, estrogen, Kegel exercises, pessary insertion, or surgery.  Avoid heavy lifting and straining with exercise and work. Do not hold your breath when you perform mild to moderate lifting and exercise activities. Limit your activities as directed by your health care provider. This information is not intended to replace advice given to you by your health care provider. Make sure you discuss any questions you have with your health care provider. Document Released: 08/06/2013 Document Revised: 01/31/2017 Document Reviewed: 01/31/2017 Elsevier Patient Education  2020 [employer].

## 2019-01-01 NOTE — Progress Notes (Signed)
Subjective  Chief Complaint  Patient presents with  . Annual Exam  . Diabetes  . Hyperlipidemia  . Hypertension  . Hypothyroidism    HPI: Michelle Sherman is a 68 y.o. female who presents to [facility] today for a Female Wellness Visit. She also has the concerns and/or needs as listed above in the chief complaint. These will be addressed in addition to the Health Maintenance Visit.   Wellness Visit: annual visit with health maintenance review and exam without Pap   HM: screens are up to date. imms are up to date except for shingrix; she has RX and will get once covid lets up.   Chronic disease f/u and/or acute problem visit: (deemed necessary to be done in addition to the wellness visit):  Diabetes follow up: Her diabetic control is reported as Worse. fastings are higher, at times up to 170. She denies sxs of hyperglycemia. She hasn't been eating well with lots of take out and some desserts.   She denies exertional CP or SOB or symptomatic hypoglycemia. She denies foot sores or paresthesias. Compliant with meds. No yeast infections.   HTN and HLD: both have been well controlled on meds. Well tolerated. Due for labs today   Hypothyroidism: also well controlled. No energy changes. No sxs of high or low thyroid. 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   No depressive sxs  C/o "something" bulging out of vaginal area: no pain but notices when she washes herself. No bleeding. No discharge. No urinary sxs or incontinence.  ROS: + constipation, + aching legs  Immunization History  Administered Date(s) Administered  . Influenza, High Dose Seasonal PF 12/06/2015, 11/01/2016, 09/21/2017, 10/26/2018  . Pneumococcal Conjugate-13 10/22/2014  . Pneumococcal Polysaccharide-23 10/05/2011, 12/21/2016  . Tdap 10/04/2010  . Zoster 10/03/2009    Diabetes Related Lab Review: Lab Results   Component Value Date   HGBA1C 6.6 (A) 01/01/2019   HGBA1C 6.5 (A) 06/27/2018   HGBA1C 6.3 (A) 12/24/2017    Lab Results  Component Value Date   MICROALBUR 0.7 12/24/2017   Lab Results  Component Value Date   CREATININE 0.64 12/24/2017   BUN 17 12/24/2017   NA 138 12/24/2017   K 4.5 12/24/2017   CL 101 12/24/2017   CO2 28 12/24/2017   Lab Results  Component Value Date   CHOL 146 12/24/2017   CHOL 144 11/01/2016   Lab Results  Component Value Date   HDL 75.80 12/24/2017   HDL 65.50 11/01/2016   Lab Results  Component Value Date   LDLCALC 53 12/24/2017   LDLCALC 47 11/01/2016   Lab Results  Component Value Date   TRIG 88.0 12/24/2017   TRIG 158.0 (H) 11/01/2016   Lab Results  Component Value Date   CHOLHDL 2 12/24/2017   CHOLHDL 2 11/01/2016   No results found for: LDLDIRECT The 10-year ASCVD risk score Mikey Bussing DC Jr., et al., 2013) is: 14.6%   Values used to calculate the score:     Age: 5 years     Sex: Female     Is Non-Hispanic African American: No     Diabetic: Yes     Tobacco smoker: No     Systolic Blood Pressure: 628 mmHg     Is BP treated: Yes     HDL Cholesterol: 75.8 mg/dL     Total Cholesterol: 146 mg/dL  BP Readings  from Last 3 Encounters:  01/01/19 116/70  07/18/18 100/60  06/27/18 121/65   Wt Readings from Last 3 Encounters:  01/01/19 209 lb (94.8 kg)  07/18/18 204 lb 9.6 oz (92.8 kg)  06/27/18 204 lb (92.5 kg)    Health Maintenance  Topic Date Due  . HEMOGLOBIN A1C  12/27/2018  . FOOT EXAM  06/27/2019  . MAMMOGRAM  08/20/2019  . OPHTHALMOLOGY EXAM  11/28/2019  . DEXA SCAN  08/07/2020  . TETANUS/TDAP  10/03/2020  . COLONOSCOPY  04/01/2026  . INFLUENZA VACCINE  Completed  . Hepatitis C Screening  Completed  . PNA vac Low Risk Adult  Completed     Assessment  1. Controlled type 2 diabetes mellitus without complication, without long-term current use of insulin ([place])   2. Hypertension associated with diabetes ([place])   3.  Severe obesity (BMI 35.0-35.9 with comorbidity) ([place])   4. Combined hyperlipidemia associated with type 2 diabetes mellitus ([place])   5. Ulcerative colitis without complications, unspecified location ([place])   6. Acquired hypothyroidism   7. Incomplete uterovaginal prolapse   8. Vitamin D deficiency      Plan  Female Wellness Visit:  Age appropriate Health Maintenance and Prevention measures were discussed with patient. Included topics are cancer screening recommendations, ways to keep healthy (see AVS) including dietary and exercise recommendations, regular eye and dental care, use of seat belts, and avoidance of moderate alcohol use and tobacco use. Screens up to date  BMI: discussed patient's BMI and encouraged positive lifestyle modifications to help get to or maintain a target BMI.  HM needs and immunizations were addressed and ordered. See below for orders. See HM and immunization section for updates.  Routine labs and screening tests ordered including cmp, cbc and lipids where appropriate.  Discussed recommendations regarding Vit D and calcium supplementation (see AVS)  Chronic disease management visit and/or acute problem visit:  XV:[id number] remains good in spite of worse eating patterns. rec eating better and recheck in 3 months. To bring meter in with her to check against ours. Check labs and urine today.   HLD: recheck today  HTN is controlled.   Low thryoid: clinically ok. Recheck levels.   Pelvic prolapse: educated. Defer referral to gyn until post covid. Manual reinsertion demonstrated for patient. IF becomes symptomatic with pain or urinary sxs, then will address sooner.   Check vit D levels: has aching legs.   Constipation measures discussed; hold calcium to see if helps.   Follow up: Return in about 3 months (around 04/01/2019) for follow up of diabetes and hypertension.  Orders Placed This Encounter  Procedures  . CBC w/Diff  . CMP  . Lipids  . TSH  . Urine MAC   . Vit D 25OH  . A1C POCT   No orders of the defined types were placed in this encounter.     Lifestyle: Body mass index is 40.82 kg/m. Wt Readings from Last 3 Encounters:  01/01/19 209 lb (94.8 kg)  07/18/18 204 lb 9.6 oz (92.8 kg)  06/27/18 204 lb (92.5 kg)    Patient Active Problem List   Diagnosis Date Noted  . Controlled type 2 diabetes mellitus without complication, without long-term current use of insulin ([place]) 11/01/2016    Priority: High    Overview:  Declined nutrition consult - due to lack of insurance coverage for this service 01/2013   . Hypertension associated with diabetes ([place]) 11/01/2016    Priority: High  . Severe obesity (BMI 35.0-35.9 with comorbidity) (  [place]) 11/01/2016    Priority: High  . Combined hyperlipidemia associated with type 2 diabetes mellitus ([place]) 01/15/2012    Priority: High    Overview:  Goal LDL < 100   . Ulcerative colitis without complications ([place]) 62/83/1517    Priority: High    Overview:  No NSAIDS   . Acquired hypothyroidism 10/04/2010    Priority: High  . OSA on CPAP 06/28/2010    Priority: High    NPSG 06/04/06- AHI ? 23/ hr, desaturation to 66%, CPAP titrated to 8, body weight 242 lbs. HST 02/05/17-AHI 23.4/hour, desaturation to 86%, body weight 192 pounds   . Diverticulosis of colon 10/17/2010    Priority: Medium  . Osteoarthrosis of knee 10/04/2010    Priority: Medium    Discussed synvisc 12/2017; has had PT. Had imaging - mild to moderate OA 2019   . 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 Allergic rhinitis 10/05/2011    Priority: Low  . Internal hemorrhoids 10/17/2010    Priority: Low  . Need for shingles vaccine 06/27/2018    Gave RX twice 02/2017 and 03/2018   . Plantar fasciitis 05/06/2017   Health Maintenance  Topic Date Due  . HEMOGLOBIN A1C  12/27/2018  . FOOT EXAM  06/27/2019  . MAMMOGRAM  08/20/2019  . OPHTHALMOLOGY EXAM  11/28/2019  . DEXA SCAN  08/07/2020  . TETANUS/TDAP  10/03/2020   . COLONOSCOPY  04/01/2026  . INFLUENZA VACCINE  Completed  . Hepatitis C Screening  Completed  . PNA vac Low Risk Adult  Completed   Immunization History  Administered Date(s) Administered  . Influenza, High Dose Seasonal PF 12/06/2015, 11/01/2016, 09/21/2017, 10/26/2018  . Pneumococcal Conjugate-13 10/22/2014  . Pneumococcal Polysaccharide-23 10/05/2011, 12/21/2016  . Tdap 10/04/2010  . Zoster 10/03/2009   We updated and reviewed the patient's past history in detail and it is documented below. Allergies: Patient is allergic to nickel and sulfa antibiotics. Past Medical History Patient  has a past medical history of Allergy, Diabetes mellitus without complication ([place]), Hypertension, and Thyroid disease. Past Surgical History Patient  has a past surgical history that includes Skin surgery (12/2015); Cholecystectomy; and Tonsillectomy and adenoidectomy (1956). Family History: Patient family history includes Cancer in her brother and father; Crohn's disease in her brother; Diabetes in her brother, brother, maternal aunt, maternal uncle, and mother; Heart attack in her paternal grandfather; Heart disease in her father; Other in her sister; Ulcerative colitis in her brother and mother; Varicose Veins in her sister. Social History:  Patient  reports that she has never smoked. She has never used smokeless tobacco. She reports current alcohol use. She reports that she does not use drugs.  Review of Systems: Constitutional: negative for fever or malaise Ophthalmic: negative for photophobia, double vision or loss of vision Cardiovascular: negative for chest pain, dyspnea on exertion, or new LE swelling Respiratory: negative for SOB or persistent cough Gastrointestinal: negative for abdominal pain, change in bowel habits or melena Genitourinary: negative for dysuria or gross hematuria, no abnormal uterine bleeding or disharge Musculoskeletal: negative for new gait disturbance or muscular  weakness Integumentary: negative for new or persistent rashes, no breast lumps Neurological: negative for TIA or stroke symptoms Psychiatric: negative for SI or delusions Allergic/Immunologic: negative for hives  Patient Care Team    Relationship Specialty Notifications Start End  Leamon Arnt, MD PCP - General Family Medicine  08/04/16   Deneise Lever, MD Consulting Physician Pulmonary Disease  01/29/17   Juanita Craver,  MD Consulting Physician Gastroenterology  03/21/17   Lyndee Hensen, PT Physical Therapist Physical Therapy  12/04/17   Trula Slade, DPM Consulting Physician Podiatry  03/27/18     Objective  Vitals: BP 116/70 (BP Location: Left Arm, Patient Position: Sitting, Cuff Size: Large)   Pulse 65   Temp 97.6 F (36.4 C) (Temporal)   Ht 5' (1.524 m)   Wt 209 lb (94.8 kg)   SpO2 98%   BMI 40.82 kg/m  General:  Well developed, well nourished, no acute distress  Psych:  Alert and orientedx3,normal mood and affect HEENT:  Normocephalic, atraumatic, non-icteric sclera, PERRL, oropharynx is clear without mass or exudate, supple neck without adenopathy, mass or thyromegaly Cardiovascular:  Normal S1, S2, RRR without gallop, rub or murmur, nondisplaced PMI Respiratory:  Good breath sounds bilaterally, CTAB with normal respiratory effort Gastrointestinal: normal bowel sounds, soft, non-tender, no noted masses. No HSM MSK: no deformities, contusions. Joints are without erythema or swelling. Spine and CVA region are nontender Skin:  Warm, no rashes or suspicious lesions noted Neurologic:    Mental status is normal. CN 2-11 are normal. Gross motor and sensory exams are normal. Normal gait. No tremor Pelvic Exam: Normal external genitalia, no vulvar or vaginal lesions present. At introitus, prolapsed cervix/uterus present and clear. Easily reduced. No pelvic mass palpated. No ttp.    Commons side effects, risks, benefits, and alternatives for medications and treatment plan  prescribed today were discussed, and the patient expressed understanding of the given instructions. Patient is instructed to call or message via MyChart if he/she has any questions or concerns regarding our treatment plan. No barriers to understanding were identified. We discussed Red Flag symptoms and signs in detail. Patient expressed understanding regarding what to do in case of urgent or emergency type symptoms.   Medication list was reconciled, printed and provided to the patient in AVS. Patient instructions and summary information was reviewed with the patient as documented in the AVS. This note was prepared with assistance of Dragon voice recognition software. Occasional wrong-word or sound-a-like substitutions may have occurred due to the inherent limitations of voice recognition software  This visit occurred during the SARS-CoV-2 public health emergency.  Safety protocols were in place, including screening questions prior to the visit, additional usage of staff PPE, and extensive cleaning of exam room while observing appropriate contact time as indicated for disinfecting solutions.

## 2019-01-02 ENCOUNTER — Other Ambulatory Visit: Payer: Self-pay

## 2019-01-02 MED ORDER — LEVOTHYROXINE SODIUM 112 MCG PO TABS: 112.0000 ug | ORAL_TABLET | Freq: Every day | ORAL | 3 refills | Status: DC

## 2019-04-02 ENCOUNTER — Ambulatory Visit: Payer: Self-pay

## 2019-04-03 ENCOUNTER — Other Ambulatory Visit: Payer: Self-pay

## 2019-04-03 ENCOUNTER — Encounter: Payer: Self-pay | Admitting: Family Medicine

## 2019-04-03 ENCOUNTER — Ambulatory Visit (INDEPENDENT_AMBULATORY_CARE_PROVIDER_SITE_OTHER): Payer: Medicare Other

## 2019-04-03 ENCOUNTER — Ambulatory Visit (INDEPENDENT_AMBULATORY_CARE_PROVIDER_SITE_OTHER): Payer: Medicare Other | Admitting: Family Medicine

## 2019-04-03 VITALS — BP 132/72 | HR 73 | Temp 97.3°F | Ht 60.0 in | Wt 206.8 lb

## 2019-04-03 DIAGNOSIS — N812 Incomplete uterovaginal prolapse: Secondary | ICD-10-CM | POA: Insufficient documentation

## 2019-04-03 DIAGNOSIS — I152 Hypertension secondary to endocrine disorders: Secondary | ICD-10-CM

## 2019-04-03 DIAGNOSIS — I10 Essential (primary) hypertension: Secondary | ICD-10-CM | POA: Diagnosis not present

## 2019-04-03 DIAGNOSIS — E1159 Type 2 diabetes mellitus with other circulatory complications: Secondary | ICD-10-CM

## 2019-04-03 DIAGNOSIS — Z0000 Encounter for general adult medical examination without abnormal findings: Secondary | ICD-10-CM | POA: Diagnosis not present

## 2019-04-03 DIAGNOSIS — E119 Type 2 diabetes mellitus without complications: Secondary | ICD-10-CM | POA: Diagnosis not present

## 2019-04-03 HISTORY — DX: Incomplete uterovaginal prolapse: N81.2

## 2019-04-03 LAB — POCT GLYCOSYLATED HEMOGLOBIN (HGB A1C): Hemoglobin A1C: 7 % — AB (ref 4.0–5.6)

## 2019-04-03 MED ORDER — BYDUREON BCISE 2 MG/0.85ML ~~LOC~~ AUIJ: 2.0000 mg | AUTO-INJECTOR | SUBCUTANEOUS | 11 refills | Status: DC

## 2019-04-03 NOTE — Progress Notes (Signed)
Subjective  CC:  Chief Complaint  Patient presents with  . Diabetes    checks dm every morning. Takes Metformin and Invokana    HPI: Michelle Sherman is a 69 y.o. female who presents to the office today for follow up of diabetes and problems listed above in the chief complaint.   Diabetes follow up: Her diabetic control is reported as Worse. Fasting avg 145. Diet isn't improved but not worse. Inquires about fructosamine as her husbands was very elevated in spite of an a1c of 6.7 with very elevated home glucoses. She denies exertional CP or SOB or symptomatic hypoglycemia. She denies foot sores or paresthesias.    Ready to see gyn for vaginal prolapse evaluation. Some bothersome sxs/irritation w/o urinary sxs or bleeding. See last note  bp remains controlled w/o cp or sob or edema. Wt Readings from Last 3 Encounters:  04/03/19 206 lb 12.7 oz (93.8 kg)  04/03/19 206 lb 12.8 oz (93.8 kg)  01/01/19 209 lb (94.8 kg)    BP Readings from Last 3 Encounters:  04/03/19 132/72  04/03/19 132/72  01/01/19 116/70    Assessment  1. Controlled type 2 diabetes mellitus without complication, without long-term current use of insulin ([place])   2. Hypertension associated with diabetes ([place])   3. Incomplete uterovaginal prolapse      Plan   Diabetes is currently marginally controlled. And trending upward. Will check fructosamine and add Glp-1 agonist. Education given. Recheck 3 months. See avs  HTN is controlled.   Refer to gyn for evaluation and treatment.   Follow up: Return in about 3 months (around 07/04/2019) for follow up of diabetes and hypertension.. Orders Placed This Encounter  Procedures  . Fructosamine  . Ambulatory referral to Obstetrics / Gynecology  . POCT glycosylated hemoglobin (Hb A1C)   Meds ordered this encounter  Medications  . Exenatide ER (BYDUREON BCISE) 2 MG/0.85ML AUIJ    Sig: Inject 2 mg into the skin once a week.    Dispense:  4 pen    Refill:  11       Immunization History  Administered Date(s) Administered  . Influenza, High Dose Seasonal PF 12/06/2015, 11/01/2016, 09/21/2017, 10/26/2018  . Pneumococcal Conjugate-13 10/22/2014  . Pneumococcal Polysaccharide-23 10/05/2011, 12/21/2016  . Tdap 10/04/2010  . Zoster 10/03/2009  . Zoster Recombinat (Shingrix) 01/02/2019, 03/25/2019    Diabetes Related Lab Review: Lab Results  Component Value Date   HGBA1C 7.0 (A) 04/03/2019   HGBA1C 6.6 (A) 01/01/2019   HGBA1C 6.5 (A) 06/27/2018    Lab Results  Component Value Date   MICROALBUR <0.7 01/01/2019   Lab Results  Component Value Date   CREATININE 0.60 01/01/2019   BUN 14 01/01/2019   NA 138 01/01/2019   K 4.3 01/01/2019   CL 101 01/01/2019   CO2 29 01/01/2019   Lab Results  Component Value Date   CHOL 153 01/01/2019   CHOL 146 12/24/2017   CHOL 144 11/01/2016   Lab Results  Component Value Date   HDL 64.60 01/01/2019   HDL 75.80 12/24/2017   HDL 65.50 11/01/2016   Lab Results  Component Value Date   LDLCALC 55 01/01/2019   LDLCALC 53 12/24/2017   LDLCALC 47 11/01/2016   Lab Results  Component Value Date   TRIG 169.0 (H) 01/01/2019   TRIG 88.0 12/24/2017   TRIG 158.0 (H) 11/01/2016   Lab Results  Component Value Date   CHOLHDL 2 01/01/2019   CHOLHDL 2 12/24/2017   CHOLHDL  2 11/01/2016   No results found for: LDLDIRECT The 10-year ASCVD risk score Mikey Bussing DC Jr., et al., 2013) is: 19.5%   Values used to calculate the score:     Age: 67 years     Sex: Female     Is Non-Hispanic African American: No     Diabetic: Yes     Tobacco smoker: No     Systolic Blood Pressure: 301 mmHg     Is BP treated: Yes     HDL Cholesterol: 64.6 mg/dL     Total Cholesterol: 153 mg/dL I have reviewed the PMH, Fam and Soc history. Patient Active Problem List   Diagnosis Date Noted  . Controlled type 2 diabetes mellitus without complication, without long-term current use of insulin ([place]) 11/01/2016    Priority: High     Overview:  Declined nutrition consult - due to lack of insurance coverage for this service 01/2013   . Hypertension associated with diabetes ([place]) 11/01/2016    Priority: High  . Severe obesity (BMI 35.0-35.9 with comorbidity) ([place]) 11/01/2016    Priority: High  . Combined hyperlipidemia associated with type 2 diabetes mellitus ([place]) 01/15/2012    Priority: High    Overview:  Goal LDL < 100   . Ulcerative colitis without complications ([place]) 60/10/9321    Priority: High    Overview:  No NSAIDS   . Acquired hypothyroidism 10/04/2010    Priority: High  . OSA on CPAP 06/28/2010    Priority: High    NPSG 06/04/06- AHI ? 23/ hr, desaturation to 66%, CPAP titrated to 8, body weight 242 lbs. HST 02/05/17-AHI 23.4/hour, desaturation to 86%, body weight 192 pounds   . Diverticulosis of colon 10/17/2010    Priority: Medium  . Osteoarthrosis of knee 10/04/2010    Priority: Medium    Discussed synvisc 12/2017; has had PT. Had imaging - mild to moderate OA 2019   . 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 Allergic rhinitis 10/05/2011    Priority: Low  . Internal hemorrhoids 10/17/2010    Priority: Low  . Incomplete uterovaginal prolapse 04/03/2019  . Need for shingles vaccine 06/27/2018    Gave RX twice 02/2017 and 03/2018   . Plantar fasciitis 05/06/2017    Social History: Patient  reports that she has never smoked. She has never used smokeless tobacco. She reports current alcohol use. She reports that she does not use drugs.  Review of Systems: Ophthalmic: negative for eye pain, loss of vision or double vision Cardiovascular: negative for chest pain Respiratory: negative for SOB or persistent cough Gastrointestinal: negative for abdominal pain Genitourinary: negative for dysuria or gross hematuria MSK: negative for foot lesions Neurologic: negative for weakness or gait disturbance  Objective  Vitals: BP 132/72 (BP Location: Right Arm, Patient Position: Sitting, Cuff  Size: Large)   Pulse 73   Temp (!) 97.3 F (36.3 C) (Temporal)   Ht 5' (1.524 m)   Wt 206 lb 12.8 oz (93.8 kg)   SpO2 98%   BMI 40.39 kg/m  General: well appearing, no acute distress  Psych:  Alert and oriented, normal mood and affect Cardiovascular:  Nl S1 and S2, RRR without murmur, gallop or rub. no edema Respiratory:  Good breath sounds bilaterally, CTAB with normal effort, no rales     Diabetic education: ongoing education regarding chronic disease management for diabetes was given today. We continue to reinforce the ABC's of diabetic management: A1c (<7 or 8 dependent upon patient), tight blood  pressure control, and cholesterol management with goal LDL < 100 minimally. We discuss diet strategies, exercise recommendations, medication options and possible side effects. At each visit, we review recommended immunizations and preventive care recommendations for diabetics and stress that good diabetic control can prevent other problems. See below for this patient's data.    Commons side effects, risks, benefits, and alternatives for medications and treatment plan prescribed today were discussed, and the patient expressed understanding of the given instructions. Patient is instructed to call or message via MyChart if he/she has any questions or concerns regarding our treatment plan. No barriers to understanding were identified. We discussed Red Flag symptoms and signs in detail. Patient expressed understanding regarding what to do in case of urgent or emergency type symptoms.   Medication list was reconciled, printed and provided to the patient in AVS. Patient instructions and summary information was reviewed with the patient as documented in the AVS. This note was prepared with assistance of Dragon voice recognition software. Occasional wrong-word or sound-a-like substitutions may have occurred due to the inherent limitations of voice recognition software  This visit occurred during the  SARS-CoV-2 public health emergency.  Safety protocols were in place, including screening questions prior to the visit, additional usage of staff PPE, and extensive cleaning of exam room while observing appropriate contact time as indicated for disinfecting solutions.

## 2019-04-03 NOTE — Patient Instructions (Addendum)
Please return in 3 months for diabetes follow up I will release your lab results to you on your MyChart account with further instructions. Please reply with any questions.   If you have any questions or concerns, please don't hesitate to send me a message via MyChart or call the office at [phone]. Thank you for visiting with [place] today! It's our pleasure caring for you.  Exenatide injection suspension extended-release (autoinjector) What is this medicine? EXENATIDE (ex EN a tide) is used to improve blood sugar control in adults with type 2 diabetes. This medicine may be used with other oral diabetes medicines. This medicine may be used for other purposes; ask your health care provider or pharmacist if you have questions. COMMON BRAND NAME(S): Bydureon BCise What should I tell my health care provider before I take this medicine? They need to know if you have any of these conditions:  endocrine tumors (MEN 2) or if someone in your family had these tumors  history of pancreatitis  kidney disease or if you are on dialysis  low blood counts, like platelets  stomach or intestine problems  thyroid cancer or if someone in your family had thyroid cancer  an unusual or allergic reaction to exenatide, medicines, foods, dyes, or preservatives  pregnant or trying to get pregnant  breast-feeding How should I use this medicine? This medicine is for injection under the skin of your upper leg, stomach area, or upper arm. It is usually given once every week (every 7 days). You will be taught how to prepare and give this medicine. Use exactly as directed. Take your medicine at regular intervals. Do not take it more often than directed. It is important that you put your used autoinjectors in a special sharps container. Do not put them in a trash can. If you do not have a sharps container, call your pharmacist or healthcare provider to get one. A special MedGuide will be given to you by the pharmacist  with each prescription and refill. Be sure to read this information carefully each time. This drug comes with INSTRUCTIONS FOR USE. Ask your pharmacist for directions on how to use this drug. Read the information carefully. Talk to your pharmacist or health care provider if you have questions. Talk to your pediatrician regarding the use of this medicine in children. Special care may be needed. Overdosage: If you think you have taken too much of this medicine contact a poison control center or emergency room at once. NOTE: This medicine is only for you. Do not share this medicine with others. What if I miss a dose? If you miss a dose, take it as soon as you can, provided your next usual scheduled dose is due at least 3 days later. If you miss a dose and your next usual scheduled dose is due 1 or 2 days later, then do not take the missed dose. Take the next dose at your regular time. Do not take double or extra doses. If you have questions about a missed dose, contact your health care provider for advice. What may interact with this medicine?  acetaminophen  birth control pills  digoxin  insulin and other medicines for diabetes  lisinopril  lovastatin  warfarin Many medications may cause changes in blood sugar, these include:  alcohol containing beverages  antiviral medicines for HIV or AIDS  aspirin and aspirin-like drugs  certain medicines for blood pressure, heart disease, irregular heart beat  certain medicines for cholesterol like fenofibrate, gemfibrozil  chromium  diuretics  female hormones, such as estrogens or progestins, birth control pills  isoniazid  lanreotide  female hormones or anabolic steroids  MAOIs like Carbex, Eldepryl, Marplan, Nardil, and Parnate  medicines for allergies, asthma, cold, or cough  medicines for depression, anxiety, or psychotic disturbances  medicines for weight loss  niacin  nicotine  NSAIDs, medicines for pain and  inflammation, like ibuprofen or naproxen  octreotide  pasireotide  pentamidine  phenytoin  probenecid  quinolone antibiotics such as ciprofloxacin, levofloxacin, ofloxacin  some herbal dietary supplements  steroid medicines such as prednisone or cortisone  sulfamethoxazole; trimethoprim  thyroid hormones Some medications can hide the warning symptoms of low blood sugar (hypoglycemia). You may need to monitor your blood sugar more closely if you are taking one of these medications. These include:  beta-blockers, often used for high blood pressure or heart problems (examples include atenolol, metoprolol, propranolol)  clonidine  guanethidine  reserpine This list may not describe all possible interactions. Give your health care provider a list of all the medicines, herbs, non-prescription drugs, or dietary supplements you use. Also tell them if you smoke, drink alcohol, or use illegal drugs. Some items may interact with your medicine. What should I watch for while using this medicine? Visit your doctor or health care professional for regular checks on your progress. A test called the HbA1C (A1C) will be monitored. This is a simple blood test. It measures your blood sugar control over the last 2 to 3 months. You will receive this test every 3 to 6 months. Learn how to check your blood sugar. Learn the symptoms of low and high blood sugar and how to manage them. Always carry a quick-source of sugar with you in case you have symptoms of low blood sugar. Examples include hard sugar candy or glucose tablets. Make sure others know that you can choke if you eat or drink when you develop serious symptoms of low blood sugar, such as seizures or unconsciousness. They must get medical help at once. Tell your doctor or health care professional if you have high blood sugar. You might need to change the dose of your medicine. If you are sick or exercising more than usual, you might need to change  the dose of your medicine. Do not skip meals. Ask your doctor or health care professional if you should avoid alcohol. Many nonprescription cough and cold products contain sugar or alcohol. These can affect blood sugar. Autoinjectors should never be shared. Sharing may result in passing of viruses like hepatitis or HIV. Wear a medical ID bracelet or chain, and carry a card that describes your disease and details of your medicine and dosage times. What side effects may I notice from receiving this medicine? Side effects that you should report to your doctor or health care professional as soon as possible:  allergic reactions like skin rash, itching or hives, swelling of the face, lips, or tongue  breathing problems  diarrhea that continues or is severe  lump or swelling on the neck  severe nausea  signs and symptoms of low blood sugar such as feeling anxious, confusion, dizziness, increased hunger, unusually weak or tired, sweating, shakiness, cold, irritable, headache, blurred vision, fast heartbeat, loss of consciousness  signs and symptoms of kidney injury like trouble passing urine or change in the amount of urine  trouble swallowing  unusual bleeding or bruising  unusual stomach upset or pain  vomiting Side effects that usually do not require medical attention (report these to your  doctor or health care professional if they continue or are bothersome):  constipation  diarrhea  dizziness  headache  nausea  pain, redness, or irritation at site where injected  stomach upset This list may not describe all possible side effects. Call your doctor for medical advice about side effects. You may report side effects to FDA at 1-800-FDA-1088. Where should I keep my medicine? Keep out of the reach of children. Store this medicine flat in a refrigerator between 2 and 8 degrees C (36 and 46 degrees F). Do not freeze. Do not use if the medicine has been frozen. Protect from light  and excessive heat. Remove from the refrigerator 15 minutes before use. The autoinjector can be kept at room temperature not to exceed 30 degrees C (86 degrees F) for no more than a total of 4 weeks, if needed. Throw away any unused medicine after the expiration date on the label. NOTE: This sheet is a summary. It may not cover all possible information. If you have questions about this medicine, talk to your doctor, pharmacist, or health care provider.  2020 Elsevier/Gold Standard (2018-09-24 09:37:28)

## 2019-04-03 NOTE — Patient Instructions (Signed)
Michelle Sherman , Thank you for taking time to come for your Medicare Wellness Visit. I appreciate your ongoing commitment to your health goals. Please review the following plan we discussed and let me know if I can assist you in the future.   Screening recommendations/referrals: Colorectal Screening: up to date; last colonoscopy 03/31/16 Mammogram: up to date; last 08/20/18 Bone Density: up to date; last 08/07/17   Vision and Dental Exams: Recommended annual ophthalmology exams for early detection of glaucoma and other disorders of the eye Recommended annual dental exams for proper oral hygiene  Diabetic Exams: Diabetic Eye Exam: recommended yearly; up to date Diabetic Foot Exam: recommended yearly; up to date   Vaccinations: Influenza vaccine: completed 10/26/18 Pneumococcal vaccine: up to date; last 12/21/16 Tdap vaccine: up to date; last 10/04/10  Shingles vaccine: You may receive this vaccine at your local pharmacy. (see handout)   Advanced directives: Please bring a copy of your POA (Power of Attorney) and/or Living Will to your next appointment.  Goals: Recommend to drink at least 6-8 8oz glasses of water per day and consume a balanced diet rich in fresh fruits and vegetables.  Limit carbohydrates and sweet drinks.   Next appointment: Please schedule your Annual Wellness Visit with your Nurse Health Advisor in one year.  Preventive Care 40-64 Years, Female Preventive care refers to lifestyle choices and visits with your health care provider that can promote health and wellness. What does preventive care include?  A yearly physical exam. This is also called an annual well check.  Dental exams once or twice a year.  Routine eye exams. Ask your health care provider how often you should have your eyes checked.  Personal lifestyle choices, including:  Daily care of your teeth and gums.  Regular physical activity.  Eating a healthy diet.  Avoiding tobacco and drug  use.  Limiting alcohol use.  Practicing safe sex.  Taking low-dose aspirin daily starting at age 34 if recommended by your health care provider.  Taking vitamin and mineral supplements as recommended by your health care provider. What happens during an annual well check? The services and screenings done by your health care provider during your annual well check will depend on your age, overall health, lifestyle risk factors, and family history of disease. Counseling  Your health care provider may ask you questions about your:  Alcohol use.  Tobacco use.  Drug use.  Emotional well-being.  Home and relationship well-being.  Sexual activity.  Eating habits.  Work and work Statistician.  Method of birth control.  Menstrual cycle.  Pregnancy history. Screening  You may have the following tests or measurements:  Height, weight, and BMI.  Blood pressure.  Lipid and cholesterol levels. These may be checked every 5 years, or more frequently if you are over 84 years old.  Skin check.  Lung cancer screening. You may have this screening every year starting at age 75 if you have a 30-pack-year history of smoking and currently smoke or have quit within the past 15 years.  Fecal occult blood test (FOBT) of the stool. You may have this test every year starting at age 50.  Flexible sigmoidoscopy or colonoscopy. You may have a sigmoidoscopy every 5 years or a colonoscopy every 10 years starting at age 36.  Hepatitis C blood test.  Hepatitis B blood test.  Sexually transmitted disease (STD) testing.  Diabetes screening. This is done by checking your blood sugar (glucose) after you have not eaten for a while (fasting).  You may have this done every 1-3 years.  Mammogram. This may be done every 1-2 years. Talk to your health care provider about when you should start having regular mammograms. This may depend on whether you have a family history of breast cancer.  BRCA-related  cancer screening. This may be done if you have a family history of breast, ovarian, tubal, or peritoneal cancers.  Pelvic exam and Pap test. This may be done every 3 years starting at age 72. Starting at age 38, this may be done every 5 years if you have a Pap test in combination with an HPV test.  Bone density scan. This is done to screen for osteoporosis. You may have this scan if you are at high risk for osteoporosis. Discuss your test results, treatment options, and if necessary, the need for more tests with your health care provider. Vaccines  Your health care provider may recommend certain vaccines, such as:  Influenza vaccine. This is recommended every year.  Tetanus, diphtheria, and acellular pertussis (Tdap, Td) vaccine. You may need a Td booster every 10 years.  Zoster vaccine. You may need this after age 33.  Pneumococcal 13-valent conjugate (PCV13) vaccine. You may need this if you have certain conditions and were not previously vaccinated.  Pneumococcal polysaccharide (PPSV23) vaccine. You may need one or two doses if you smoke cigarettes or if you have certain conditions. Talk to your health care provider about which screenings and vaccines you need and how often you need them. This information is not intended to replace advice given to you by your health care provider. Make sure you discuss any questions you have with your health care provider. Document Released: 02/05/2015 Document Revised: 09/29/2015 Document Reviewed: 11/10/2014 Elsevier Interactive Patient Education  2017 [employer] Prevention in the Home Falls can cause injuries. They can happen to people of all ages. There are many things you can do to make your home safe and to help prevent falls. What can I do on the outside of my home?  Regularly fix the edges of walkways and driveways and fix any cracks.  Remove anything that might make you trip as you walk through a door, such as a raised step or  threshold.  Trim any bushes or trees on the path to your home.  Use bright outdoor lighting.  Clear any walking paths of anything that might make someone trip, such as rocks or tools.  Regularly check to see if handrails are loose or broken. Make sure that both sides of any steps have handrails.  Any raised decks and porches should have guardrails on the edges.  Have any leaves, snow, or ice cleared regularly.  Use sand or salt on walking paths during winter.  Clean up any spills in your garage right away. This includes oil or grease spills. What can I do in the bathroom?  Use night lights.  Install grab bars by the toilet and in the tub and shower. Do not use towel bars as grab bars.  Use non-skid mats or decals in the tub or shower.  If you need to sit down in the shower, use a plastic, non-slip stool.  Keep the floor dry. Clean up any water that spills on the floor as soon as it happens.  Remove soap buildup in the tub or shower regularly.  Attach bath mats securely with double-sided non-slip rug tape.  Do not have throw rugs and other things on the floor that can make  you trip. What can I do in the bedroom?  Use night lights.  Make sure that you have a light by your bed that is easy to reach.  Do not use any sheets or blankets that are too big for your bed. They should not hang down onto the floor.  Have a firm chair that has side arms. You can use this for support while you get dressed.  Do not have throw rugs and other things on the floor that can make you trip. What can I do in the kitchen?  Clean up any spills right away.  Avoid walking on wet floors.  Keep items that you use a lot in easy-to-reach places.  If you need to reach something above you, use a strong step stool that has a grab bar.  Keep electrical cords out of the way.  Do not use floor polish or wax that makes floors slippery. If you must use wax, use non-skid floor wax.  Do not have  throw rugs and other things on the floor that can make you trip. What can I do with my stairs?  Do not leave any items on the stairs.  Make sure that there are handrails on both sides of the stairs and use them. Fix handrails that are broken or loose. Make sure that handrails are as long as the stairways.  Check any carpeting to make sure that it is firmly attached to the stairs. Fix any carpet that is loose or worn.  Avoid having throw rugs at the top or bottom of the stairs. If you do have throw rugs, attach them to the floor with carpet tape.  Make sure that you have a light switch at the top of the stairs and the bottom of the stairs. If you do not have them, ask someone to add them for you. What else can I do to help prevent falls?  Wear shoes that:  Do not have high heels.  Have rubber bottoms.  Are comfortable and fit you well.  Are closed at the toe. Do not wear sandals.  If you use a stepladder:  Make sure that it is fully opened. Do not climb a closed stepladder.  Make sure that both sides of the stepladder are locked into place.  Ask someone to hold it for you, if possible.  Clearly mark and make sure that you can see:  Any grab bars or handrails.  First and last steps.  Where the edge of each step is.  Use tools that help you move around (mobility aids) if they are needed. These include:  Canes.  Walkers.  Scooters.  Crutches.  Turn on the lights when you go into a dark area. Replace any light bulbs as soon as they burn out.  Set up your furniture so you have a clear path. Avoid moving your furniture around.  If any of your floors are uneven, fix them.  If there are any pets around you, be aware of where they are.  Review your medicines with your doctor. Some medicines can make you feel dizzy. This can increase your chance of falling. Ask your doctor what other things that you can do to help prevent falls. This information is not intended to  replace advice given to you by your health care provider. Make sure you discuss any questions you have with your health care provider. Document Released: 11/05/2008 Document Revised: 06/17/2015 Document Reviewed: 02/13/2014 Elsevier Interactive Patient Education  2017 [employer].

## 2019-04-03 NOTE — Progress Notes (Signed)
Subjective:   Michelle Sherman is a 69 y.o. female who presents for Medicare Annual (Subsequent) preventive examination.  Review of Systems:   Cardiac Risk Factors include: advanced age (>21mn, >>49women);diabetes mellitus;hypertension;dyslipidemia     Objective:     Vitals: BP 132/72   Pulse 73   Temp (!) 97.3 F (36.3 C) (Temporal)   Ht 5' (1.524 m)   Wt 206 lb 12.7 oz (93.8 kg)   SpO2 98%   BMI 40.39 kg/m   Body mass index is 40.39 kg/m.  Advanced Directives 03/27/2018 12/04/2017 03/21/2017  Does Patient Have a Medical Advance Directive? No No No  Would patient like information on creating a medical advance directive? Yes (MAU/Ambulatory/Procedural Areas - Information given) No - Patient declined Yes (MAU/Ambulatory/Procedural Areas - Information given)    Tobacco Social History   Tobacco Use  Smoking Status Never Smoker  Smokeless Tobacco Never Used     Counseling given: Not Answered   Clinical Intake:  Pre-visit preparation completed: Yes  Pain : No/denies pain  Diabetes: Yes CBG done?: No Did pt. bring in CBG monitor from home?: No  How often do you need to have someone help you when you read instructions, pamphlets, or other written materials from your doctor or pharmacy?: 1 - Never  Interpreter Needed?: No  Information entered by :: CDenman GeorgeLPN  Past Medical History:  Diagnosis Date  . Allergy   . Diabetes mellitus without complication ([place]   . Hypertension   . Thyroid disease    Past Surgical History:  Procedure Laterality Date  . CHOLECYSTECTOMY    . SKIN SURGERY  12/2015   Fatty tumor removed  . TONSILLECTOMY AND ADENOIDECTOMY  1956   Family History  Problem Relation Age of Onset  . Ulcerative colitis Mother   . Diabetes Mother   . Cancer Father        Lung  . Heart disease Father   . Varicose Veins Sister   . Other Sister        "stiff heart"  . Cancer Brother        Pancreatic  . Diabetes Brother   . Diabetes  Maternal Aunt   . Diabetes Maternal Uncle   . Heart attack Paternal Grandfather   . Crohn's disease Brother   . Ulcerative colitis Brother   . Diabetes Brother    Social History   Socioeconomic History  . Marital status: Married    Spouse name: Not on file  . Number of children: 1  . Years of education: Not on file  . Highest education level: Not on file  Occupational History  . Occupation: Retired     Comment: WOceanographer  Tobacco Use  . Smoking status: Never Smoker  . Smokeless tobacco: Never Used  Substance and Sexual Activity  . Alcohol use: Yes    Comment: occa  . Drug use: No  . Sexual activity: Yes  Other Topics Concern  . Not on file  Social History Narrative   Married, cares for disabled husband,  no tob, Etoh or drug use; no exercise   Social Determinants of Health   Financial Resource Strain:   . Difficulty of Paying Living Expenses:   Food Insecurity:   . Worried About RCharity fundraiserin the Last Year:   . RArboriculturistin the Last Year:   Transportation Needs:   . LFilm/video editor(Medical):   .Marland KitchenLack of Transportation (Non-Medical):  Physical Activity:   . Days of Exercise per Week:   . Minutes of Exercise per Session:   Stress:   . Feeling of Stress :   Social Connections:   . Frequency of Communication with Friends and Family:   . Frequency of Social Gatherings with Friends and Family:   . Attends Religious Services:   . Active Member of Clubs or Organizations:   . Attends Archivist Meetings:   Marland Kitchen Marital Status:     Outpatient Encounter Medications as of 04/03/2019  Medication Sig  . Ascorbic Acid (VITAMIN C) 1000 MG tablet Take 1,000 mg by mouth daily.  Marland Kitchen aspirin EC 81 MG tablet Take 1 tablet by mouth daily.  Marland Kitchen atorvastatin (LIPITOR) 10 MG tablet Take 1 tablet (10 mg total) by mouth every evening.  Marland Kitchen BIOTIN 5000 PO Take 1 tablet by mouth daily.  . Calcium Citrate-Vitamin D 200-250 MG-UNIT TABS Take 1 tablet by mouth  daily.  . canagliflozin (INVOKANA) 300 MG TABS tablet Take 1 tablet (300 mg total) by mouth daily.  . Cholecalciferol (VITAMIN D3) 2000 units capsule Take 1 capsule by mouth daily.  . diclofenac sodium (VOLTAREN) 1 % GEL Apply 2 g topically 4 (four) times daily. Rub into affected area of foot 2 to 4 times daily  . fexofenadine (ALLEGRA) 180 MG tablet Take 1 tablet by mouth daily.  . fluticasone (FLONASE) 50 MCG/ACT nasal spray Place 2 sprays into the nose daily as needed.  Marland Kitchen glucosamine-chondroitin 500-400 MG tablet Take 1 tablet by mouth 3 (three) times daily.  Marland Kitchen levothyroxine (SYNTHROID) 112 MCG tablet Take 1 tablet (112 mcg total) by mouth daily.  . mesalamine (LIALDA) 1.2 g EC tablet Take by mouth. Take 2 tablet in am and 2 tablet in pm  . metFORMIN (GLUCOPHAGE) 1000 MG tablet Take 1 tablet (1,000 mg total) by mouth 2 (two) times daily with a meal.  . Misc Natural Products (TURMERIC CURCUMIN) CAPS Take 1 capsule by mouth daily.  . Multiple Vitamin (MULTIVITAMIN) tablet Take 1 tablet by mouth daily.  . ramipril (ALTACE) 10 MG capsule Take 1 capsule (10 mg total) by mouth daily.  . [DISCONTINUED] traMADol (ULTRAM) 50 MG tablet Take 1 tablet (50 mg total) by mouth every 8 (eight) hours as needed. (Patient not taking: Reported on 04/03/2019)   No facility-administered encounter medications on file as of 04/03/2019.    Activities of Daily Living In your present state of health, do you have any difficulty performing the following activities: 04/03/2019  Hearing? N  Vision? N  Difficulty concentrating or making decisions? N  Walking or climbing stairs? N  Dressing or bathing? N  Doing errands, shopping? N  Preparing Food and eating ? N  Using the Toilet? N  In the past six months, have you accidently leaked urine? N  Do you have problems with loss of bowel control? N  Managing your Medications? N  Managing your Finances? N  Housekeeping or managing your Housekeeping? N  Some recent data  might be hidden    Patient Care Team: Leamon Arnt, MD as PCP - General (Family Medicine) Deneise Lever, MD as Consulting Physician (Pulmonary Disease) Juanita Craver, MD as Consulting Physician (Gastroenterology) Lyndee Hensen, PT as Physical Therapist (Physical Therapy) Trula , DPM as Consulting Physician (Podiatry) Katy Apo, MD as Consulting Physician (Ophthalmology)    Assessment:   This is a routine wellness examination for [place].  Exercise Activities and Dietary recommendations Current Exercise Habits: The patient  does not participate in regular exercise at present  Goals    . Weight (lb) < 180 lb (81.6 kg)     Lose weight by continuing weight watchers and increasing exercise.     . Weight (lb) < 195 lb (88.5 kg)     Lose weight by watching carb intake and increase activity.        Fall Risk Fall Risk  04/03/2019 04/03/2019 03/27/2018 06/19/2017 03/21/2017  Falls in the past year? 0 0 0 No No  Number falls in past yr: 0 - - - -  Injury with Fall? 0 - - - -  Risk for fall due to : No Fall Risks - - - -  Follow up Falls evaluation completed;Education provided;Falls prevention discussed Falls evaluation completed - - -   Is the patient's home free of loose throw rugs in walkways, pet beds, electrical cords, etc?   yes      Grab bars in the bathroom? yes      Handrails on the stairs?   yes      Adequate lighting?   yes  Timed Get Up and Go performed: completed and within normal timeframe; no gait abnormalities noted   Depression Screen PHQ 2/9 Scores 04/03/2019 04/03/2019 03/27/2018 06/19/2017  PHQ - 2 Score 0 0 0 0  PHQ- 9 Score - - - -     Cognitive Function MMSE - Mini Mental State Exam 03/27/2018  Orientation to time 5  Orientation to Place 5  Registration 3  Attention/ Calculation 5  Recall 3  Language- name 2 objects 2  Language- repeat 1  Language- follow 3 step command 3  Language- read & follow direction 1  Write a sentence 1  Copy  design 1  Total score 30     6CIT Screen 04/03/2019  What Year? 0 points  What month? 0 points  What time? 0 points  Count back from 20 0 points  Months in reverse 0 points  Repeat phrase 0 points  Total Score 0    Immunization History  Administered Date(s) Administered  . Influenza, High Dose Seasonal PF 12/06/2015, 11/01/2016, 09/21/2017, 10/26/2018  . Pneumococcal Conjugate-13 10/22/2014  . Pneumococcal Polysaccharide-23 10/05/2011, 12/21/2016  . Tdap 10/04/2010  . Zoster 10/03/2009  . Zoster Recombinat (Shingrix) 01/02/2019, 03/25/2019    Qualifies for Shingles Vaccine? Shingrix completed   Screening Tests Health Maintenance  Topic Date Due  . FOOT EXAM  06/27/2019  . MAMMOGRAM  08/20/2019  . HEMOGLOBIN A1C  10/04/2019  . OPHTHALMOLOGY EXAM  11/28/2019  . DEXA SCAN  08/07/2020  . TETANUS/TDAP  10/03/2020  . COLONOSCOPY  04/01/2026  . INFLUENZA VACCINE  Completed  . Hepatitis C Screening  Completed  . PNA vac Low Risk Adult  Completed    Cancer Screenings: Lung: Low Dose CT Chest recommended if Age 48-80 years, 30 pack-year currently smoking OR have quit w/in 15years. Patient does not qualify. Breast:  Up to date on Mammogram? Yes   Up to date of Bone Density/Dexa? Yes Colorectal: colonoscopy 03/31/16 with Dr. Collene Mares     Plan:  I have personally reviewed and addressed the Medicare Annual Wellness questionnaire and have noted the following in the patient's chart:  A. Medical and social history B. Use of alcohol, tobacco or illicit drugs  C. Current medications and supplements D. Functional ability and status E.  Nutritional status F.  Physical activity G. Advance directives H. List of other physicians I.  Hospitalizations, surgeries, and ER visits  in previous 12 months J.  [place] such as hearing and vision if needed, cognitive and depression L. Referrals, records requested, and appointments- none   In addition, I have reviewed and discussed  with patient certain preventive protocols, quality metrics, and best practice recommendations. A written personalized care plan for preventive services as well as general preventive health recommendations were provided to patient.   Signed,  Denman George, LPN  Nurse Health Advisor   Nurse Notes: Immunizations updated

## 2019-04-08 LAB — FRUCTOSAMINE: Fructosamine: 274 umol/L (ref 205–285)

## 2019-04-15 DIAGNOSIS — N8182 Incompetence or weakening of pubocervical tissue: Secondary | ICD-10-CM | POA: Diagnosis not present

## 2019-04-24 ENCOUNTER — Ambulatory Visit: Payer: Medicare Other | Attending: Internal Medicine

## 2019-04-24 DIAGNOSIS — Z23 Encounter for immunization: Secondary | ICD-10-CM

## 2019-04-24 NOTE — Progress Notes (Signed)
   Covid-19 Vaccination Clinic  Name:  [name]    MRN: [id number] DOB: [birth year]  04/24/2019  [name] was observed post Covid-19 immunization for 15 minutes without incident. She was provided with Vaccine Information Sheet and instruction to access the V-Safe system.   [name] was instructed to call 911 with any severe reactions post vaccine: Marland Kitchen Difficulty breathing  . Swelling of face and throat  . A fast heartbeat  . A bad rash all over body  . Dizziness and weakness   Immunizations Administered    Name Date Dose VIS Date Route   Moderna COVID-19 Vaccine 04/24/2019 12:17 PM 0.5 mL 12/24/2018 Intramuscular   Manufacturer: Moderna   Lot: [id number]   [place]: [id number]

## 2019-04-30 ENCOUNTER — Encounter: Payer: Self-pay | Admitting: Family Medicine

## 2019-05-05 ENCOUNTER — Other Ambulatory Visit: Payer: Self-pay

## 2019-05-06 ENCOUNTER — Ambulatory Visit: Payer: Medicare Other

## 2019-05-06 DIAGNOSIS — Z7189 Other specified counseling: Secondary | ICD-10-CM

## 2019-05-06 NOTE — Progress Notes (Signed)
Patient presents to office today for nurse visit on instructions of how to administer Exenatide ER. Went over inserted packet with patient and watched as she administered her first dose. Patient tolerated well and is aware to call our office if she has any questions or concerns.

## 2019-05-21 ENCOUNTER — Ambulatory Visit: Payer: Medicare Other | Attending: Internal Medicine

## 2019-05-21 DIAGNOSIS — Z23 Encounter for immunization: Secondary | ICD-10-CM

## 2019-05-21 NOTE — Progress Notes (Signed)
   Covid-19 Vaccination Clinic  Name:  Michelle Sherman    MRN: [id number] DOB: 11/23/1950  05/21/2019  [name] was observed post Covid-19 immunization for 15 minutes without incident. She was provided with Vaccine Information Sheet and instruction to access the V-Safe system.   [name] was instructed to call 911 with any severe reactions post vaccine: Marland Kitchen Difficulty breathing  . Swelling of face and throat  . A fast heartbeat  . A bad rash all over body  . Dizziness and weakness   Immunizations Administered    Name Date Dose VIS Date Route   Moderna COVID-19 Vaccine 05/21/2019 12:02 PM 0.5 mL 12/2018 Intramuscular   Manufacturer: Moderna   Lot: [id number]   [place]: [id number]

## 2019-05-28 ENCOUNTER — Ambulatory Visit: Payer: Self-pay

## 2019-06-18 DIAGNOSIS — N952 Postmenopausal atrophic vaginitis: Secondary | ICD-10-CM | POA: Diagnosis not present

## 2019-06-18 DIAGNOSIS — N8182 Incompetence or weakening of pubocervical tissue: Secondary | ICD-10-CM | POA: Diagnosis not present

## 2019-07-10 ENCOUNTER — Other Ambulatory Visit: Payer: Self-pay

## 2019-07-10 ENCOUNTER — Encounter: Payer: Self-pay | Admitting: Family Medicine

## 2019-07-10 ENCOUNTER — Ambulatory Visit (INDEPENDENT_AMBULATORY_CARE_PROVIDER_SITE_OTHER): Payer: Medicare Other | Admitting: Family Medicine

## 2019-07-10 VITALS — BP 120/60 | HR 84 | Temp 98.2°F | Ht 60.0 in | Wt 204.3 lb

## 2019-07-10 DIAGNOSIS — I10 Essential (primary) hypertension: Secondary | ICD-10-CM

## 2019-07-10 DIAGNOSIS — E1159 Type 2 diabetes mellitus with other circulatory complications: Secondary | ICD-10-CM | POA: Diagnosis not present

## 2019-07-10 DIAGNOSIS — E119 Type 2 diabetes mellitus without complications: Secondary | ICD-10-CM | POA: Diagnosis not present

## 2019-07-10 LAB — POCT GLYCOSYLATED HEMOGLOBIN (HGB A1C): Hemoglobin A1C: 6.4 % — AB (ref 4.0–5.6)

## 2019-07-10 MED ORDER — ATORVASTATIN CALCIUM 10 MG PO TABS: 10.0000 mg | ORAL_TABLET | Freq: Every evening | ORAL | 3 refills | Status: DC

## 2019-07-10 MED ORDER — METFORMIN HCL 1000 MG PO TABS: 1000.0000 mg | ORAL_TABLET | Freq: Two times a day (BID) | ORAL | 3 refills | Status: DC

## 2019-07-10 MED ORDER — RAMIPRIL 10 MG PO CAPS: 10.0000 mg | ORAL_CAPSULE | Freq: Every day | ORAL | 3 refills | Status: DC

## 2019-07-10 NOTE — Patient Instructions (Signed)
Please return in Dec 2021 for your annual complete physical; please come fasting and diabetes follow up.  If you have any questions or concerns, please don't hesitate to send me a message via MyChart or call the office at [phone]. Thank you for visiting with [place] today! It's our pleasure caring for you.  Please call the office checked below to schedule your mammogram appointment:  [x]   [facility]      [address] [place], [place]

## 2019-07-10 NOTE — Progress Notes (Signed)
Subjective  CC:  Chief Complaint  Patient presents with  . Diabetes    HPI: Michelle Sherman is a 69 y.o. female who presents to the office today for follow up of diabetes and problems listed above in the chief complaint.   Diabetes follow up: we added Bcise at last visit. Tolerating well. No concerns. Her diabetic control is reported as Improved.  She denies exertional CP or SOB or symptomatic hypoglycemia. She denies foot sores or paresthesias.   bp is fine.  Feels well.  Wt Readings from Last 3 Encounters:  07/10/19 204 lb 4.8 oz (92.7 kg)  04/03/19 206 lb 12.7 oz (93.8 kg)  04/03/19 206 lb 12.8 oz (93.8 kg)    BP Readings from Last 3 Encounters:  07/10/19 120/60  04/03/19 132/72  04/03/19 132/72    Assessment  1. Controlled type 2 diabetes mellitus without complication, without long-term current use of insulin ([place])   2. Hypertension associated with diabetes ([place])      Plan   Diabetes is currently very well controlled. Continue same meds. Labs up to date.   HTN is controlled.    Follow up: cpe in december. Orders Placed This Encounter  Procedures  . POCT HgB A1C   Meds ordered this encounter  Medications  . atorvastatin (LIPITOR) 10 MG tablet    Sig: Take 1 tablet (10 mg total) by mouth every evening.    Dispense:  90 tablet    Refill:  3  . metFORMIN (GLUCOPHAGE) 1000 MG tablet    Sig: Take 1 tablet (1,000 mg total) by mouth 2 (two) times daily with a meal.    Dispense:  180 tablet    Refill:  3  . ramipril (ALTACE) 10 MG capsule    Sig: Take 1 capsule (10 mg total) by mouth daily.    Dispense:  90 capsule    Refill:  3        Immunization History  Administered Date(s) Administered  . Influenza, High Dose Seasonal PF 12/06/2015, 11/01/2016, 09/21/2017, 10/26/2018  . Moderna SARS-COVID-2 Vaccination 04/24/2019, 05/21/2019  . Pneumococcal Conjugate-13 10/22/2014  . Pneumococcal Polysaccharide-23 10/05/2011, 12/21/2016  . Tdap 10/04/2010  .  Zoster 10/03/2009  . Zoster Recombinat (Shingrix) 01/02/2019, 03/25/2019    Diabetes Related Lab Review: Lab Results  Component Value Date   HGBA1C 6.4 (A) 07/10/2019   HGBA1C 7.0 (A) 04/03/2019   HGBA1C 6.6 (A) 01/01/2019    Lab Results  Component Value Date   MICROALBUR <0.7 01/01/2019   Lab Results  Component Value Date   CREATININE 0.60 01/01/2019   BUN 14 01/01/2019   NA 138 01/01/2019   K 4.3 01/01/2019   CL 101 01/01/2019   CO2 29 01/01/2019   Lab Results  Component Value Date   CHOL 153 01/01/2019   CHOL 146 12/24/2017   CHOL 144 11/01/2016   Lab Results  Component Value Date   HDL 64.60 01/01/2019   HDL 75.80 12/24/2017   HDL 65.50 11/01/2016   Lab Results  Component Value Date   LDLCALC 55 01/01/2019   LDLCALC 53 12/24/2017   LDLCALC 47 11/01/2016   Lab Results  Component Value Date   TRIG 169.0 (H) 01/01/2019   TRIG 88.0 12/24/2017   TRIG 158.0 (H) 11/01/2016   Lab Results  Component Value Date   CHOLHDL 2 01/01/2019   CHOLHDL 2 12/24/2017   CHOLHDL 2 11/01/2016   No results found for: LDLDIRECT The 10-year ASCVD risk score (Goff DC Jr.,  et al., 2013) is: 16.4%   Values used to calculate the score:     Age: 68 years     Sex: Female     Is Non-Hispanic African American: No     Diabetic: Yes     Tobacco smoker: No     Systolic Blood Pressure: 419 mmHg     Is BP treated: Yes     HDL Cholesterol: 64.6 mg/dL     Total Cholesterol: 153 mg/dL I have reviewed the PMH, Fam and Soc history. Patient Active Problem List   Diagnosis Date Noted  . Controlled type 2 diabetes mellitus without complication, without long-term current use of insulin ([place]) 11/01/2016    Priority: High    Overview:  Declined nutrition consult - due to lack of insurance coverage for this service 01/2013   . Hypertension associated with diabetes ([place]) 11/01/2016    Priority: High  . Severe obesity (BMI 35.0-35.9 with comorbidity) ([place]) 11/01/2016    Priority: High  .  Combined hyperlipidemia associated with type 2 diabetes mellitus ([place]) 01/15/2012    Priority: High    Overview:  Goal LDL < 100   . Ulcerative colitis without complications ([place]) 37/90/2409    Priority: High    Overview:  No NSAIDS   . Acquired hypothyroidism 10/04/2010    Priority: High  . OSA on CPAP 06/28/2010    Priority: High    NPSG 06/04/06- AHI ? 23/ hr, desaturation to 66%, CPAP titrated to 8, body weight 242 lbs. HST 02/05/17-AHI 23.4/hour, desaturation to 86%, body weight 192 pounds   . Diverticulosis of colon 10/17/2010    Priority: Medium  . Osteoarthrosis of knee 10/04/2010    Priority: Medium    Discussed synvisc 12/2017; has had PT. Had imaging - mild to moderate OA 2019   . 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 Allergic rhinitis 10/05/2011    Priority: Low  . Internal hemorrhoids 10/17/2010    Priority: Low  . Incomplete uterovaginal prolapse 04/03/2019  . Need for shingles vaccine 06/27/2018    Gave RX twice 02/2017 and 03/2018   . Plantar fasciitis 05/06/2017    Social History: Patient  reports that she has never smoked. She has never used smokeless tobacco. She reports current alcohol use. She reports that she does not use drugs.  Review of Systems: Ophthalmic: negative for eye pain, loss of vision or double vision Cardiovascular: negative for chest pain Respiratory: negative for SOB or persistent cough Gastrointestinal: negative for abdominal pain Genitourinary: negative for dysuria or gross hematuria MSK: negative for foot lesions Neurologic: negative for weakness or gait disturbance  Objective  Vitals: BP 120/60   Pulse 84   Temp 98.2 F (36.8 C) (Temporal)   Ht 5' (1.524 m)   Wt 204 lb 4.8 oz (92.7 kg)   SpO2 96%   BMI 39.90 kg/m  General: well appearing, no acute distress  Psych:  Alert and oriented, normal mood and affect Foot exam: no erythema, pallor, or cyanosis visible nl proprioception and sensation to monofilament  testing bilaterally, +2 distal pulses bilaterally    Diabetic education: ongoing education regarding chronic disease management for diabetes was given today. We continue to reinforce the ABC's of diabetic management: A1c (<7 or 8 dependent upon patient), tight blood pressure control, and cholesterol management with goal LDL < 100 minimally. We discuss diet strategies, exercise recommendations, medication options and possible side effects. At each visit, we review recommended immunizations and preventive care recommendations for  diabetics and stress that good diabetic control can prevent other problems. See below for this patient's data.    Commons side effects, risks, benefits, and alternatives for medications and treatment plan prescribed today were discussed, and the patient expressed understanding of the given instructions. Patient is instructed to call or message via MyChart if he/she has any questions or concerns regarding our treatment plan. No barriers to understanding were identified. We discussed Red Flag symptoms and signs in detail. Patient expressed understanding regarding what to do in case of urgent or emergency type symptoms.   Medication list was reconciled, printed and provided to the patient in AVS. Patient instructions and summary information was reviewed with the patient as documented in the AVS. This note was prepared with assistance of Dragon voice recognition software. Occasional wrong-word or sound-a-like substitutions may have occurred due to the inherent limitations of voice recognition software  This visit occurred during the SARS-CoV-2 public health emergency.  Safety protocols were in place, including screening questions prior to the visit, additional usage of staff PPE, and extensive cleaning of exam room while observing appropriate contact time as indicated for disinfecting solutions.

## 2019-07-14 ENCOUNTER — Other Ambulatory Visit: Payer: Self-pay | Admitting: Family Medicine

## 2019-07-14 DIAGNOSIS — Z1231 Encounter for screening mammogram for malignant neoplasm of breast: Secondary | ICD-10-CM

## 2019-07-17 ENCOUNTER — Ambulatory Visit (INDEPENDENT_AMBULATORY_CARE_PROVIDER_SITE_OTHER): Payer: Medicare Other | Admitting: Internal Medicine

## 2019-07-17 ENCOUNTER — Other Ambulatory Visit: Payer: Self-pay

## 2019-07-17 ENCOUNTER — Encounter: Payer: Self-pay | Admitting: Internal Medicine

## 2019-07-17 DIAGNOSIS — G4733 Obstructive sleep apnea (adult) (pediatric): Secondary | ICD-10-CM | POA: Diagnosis not present

## 2019-07-17 DIAGNOSIS — Z9989 Dependence on other enabling machines and devices: Secondary | ICD-10-CM

## 2019-07-17 DIAGNOSIS — Z6835 Body mass index (BMI) 35.0-35.9, adult: Secondary | ICD-10-CM

## 2019-07-17 NOTE — Patient Instructions (Signed)
We can continue CPAP auto 5-15, mask of choice, humidifier, supplies, AirView/ card  Please cal if we can help

## 2019-07-17 NOTE — Progress Notes (Signed)
HPI female never smoker followed for OSA complicated by hypothyroid, DM 2, hyperlipidemia, allergic rhinitis, HBP, ulcerative colitis, morbid obesity NPSG 06/04/06- AHI ? 23/ hr, desaturation to 66%, CPAP titrated to 8, body weight 242 lbs HST 02/05/17-AHI 23.4/hour, desaturation to 86%, body weight 192 pounds -----------------------------------------------------------------------------   07/18/2018- 69 year old female never smoker followed for OSA complicated by hypothyroid, DM 2, hyperlipidemia, allergic rhinitis, HBP, ulcerative colitis, morbid obesity CPAP auto 5-12/ [employer] Download 100% compliance, AHI 0.7/ hr Body weight today 204 lbs -----OSA on CPAP auto 5-15, DME: [employer]; recently got new mask, inquires about oral appliance. She asks about alternatives to CPAP- discussed.  Denies acute events or major health changes.   07/17/19- 69 year old female never smoker followed for OSA complicated by hypothyroid, DM 2, hyperlipidemia, allergic rhinitis, HBP, ulcerative colitis, morbid obesity CPAP auto 5-15/ [employer] Download compliance 100%, AHI 0.9/ hr Body weight today- 202 lbs Covax- 2 Moderna She wears both CPAP and an oral appliance. Says OAD not comfortable without airflow.    Marland KitchenROS-see HPI   + = positive Constitutional:    weight loss, night sweats, fevers, chills, fatigue, lassitude. HEENT:    headaches, difficulty swallowing, tooth/dental problems, sore throat,       sneezing, itching, ear ache, nasal congestion, post nasal drip, snoring CV:    chest pain, orthopnea, PND, swelling in lower extremities, anasarca,                                   dizziness, palpitations Resp:   shortness of breath with exertion or at rest.                productive cough,   non-productive cough, coughing up of blood.              change in color of mucus.  wheezing.   Skin:    rash or lesions. GI:  No-   heartburn, indigestion, abdominal pain, nausea, vomiting, diarrhea,                 change in  bowel habits, loss of appetite GU: dysuria, change in color of urine, no urgency or frequency.   flank pain. MS:   joint pain, +stiffness, decreased range of motion, back pain. Neuro-     nothing unusual Psych:  change in mood or affect.  depression or anxiety.   memory loss.  OBJ- Physical Exam General- Alert, Oriented, Affect-appropriate, Distress- none acute, + obese Skin- rash-none, lesions- none, excoriation- none Lymphadenopathy- none Head- atraumatic            Eyes- Gross vision intact, PERRLA, conjunctivae and secretions clear            Ears- Hearing, canals-normal            Nose- Clear, no-Septal dev, mucus, polyps, erosion, perforation             Throat- Mallampati II-III , mucosa clear , drainage- none, tonsils- atrophic Neck- flexible , trachea midline, no stridor , thyroid nl, carotid no bruit Chest - symmetrical excursion , unlabored           Heart/CV- RRR , no murmur , no gallop  , no rub, nl s1 s2                           - JVD- none , edema- none, stasis changes- none, varices- none  Lung-    clear, wheeze- none, cough- none , dullness-none, rub- none           Chest wall-  Abd-  Br/ Gen/ Rectal- Not done, not indicated Extrem- cyanosis- none, clubbing, none, atrophy- none, strength- nl, + superficial varices L calf Neuro- grossly intact to observation

## 2019-07-22 ENCOUNTER — Telehealth: Payer: Self-pay | Admitting: Family Medicine

## 2019-07-22 DIAGNOSIS — R194 Change in bowel habit: Secondary | ICD-10-CM | POA: Diagnosis not present

## 2019-07-22 DIAGNOSIS — R112 Nausea with vomiting, unspecified: Secondary | ICD-10-CM | POA: Diagnosis not present

## 2019-07-22 DIAGNOSIS — K5730 Diverticulosis of large intestine without perforation or abscess without bleeding: Secondary | ICD-10-CM | POA: Diagnosis not present

## 2019-07-22 DIAGNOSIS — Z86010 Personal history of colonic polyps: Secondary | ICD-10-CM | POA: Diagnosis not present

## 2019-07-22 DIAGNOSIS — K219 Gastro-esophageal reflux disease without esophagitis: Secondary | ICD-10-CM | POA: Diagnosis not present

## 2019-07-22 NOTE — Telephone Encounter (Signed)
Patient called and stated she has had a number of stomach problems recently and just had an appt with dr Youlanda Mighty and they talked about her new medication atorvastatin (LIPITOR) 10 MG tablet and dr. Youlanda Mighty suggested her to stop taking the medication to see if her symptoms stop and she just wanted to make sure she was okay to stop taking medication

## 2019-07-23 NOTE — Telephone Encounter (Signed)
Please advise 

## 2019-07-23 NOTE — Telephone Encounter (Signed)
Ok to hold lipitor for a few weeks to see if this is the culprit. Not typically a GI irritant though. Thanks.

## 2019-07-23 NOTE — Telephone Encounter (Signed)
Called patient reviewed will let office know if any questions.

## 2019-07-23 NOTE — Telephone Encounter (Signed)
Pt called back regarding message that was first sent. Pt stated the medication she was originally talking about was BYDUREON BCISE injection, not lipitor. Pt is asking to hold the injection due to GI issues.  Please advise.

## 2019-07-23 NOTE — Telephone Encounter (Signed)
Ahhh, yes, that makes more sense.  Can hold injection and monitor sxs. Can change to different medication if needed.

## 2019-08-22 ENCOUNTER — Other Ambulatory Visit: Payer: Self-pay

## 2019-08-22 ENCOUNTER — Ambulatory Visit
Admission: RE | Admit: 2019-08-22 | Discharge: 2019-08-22 | Disposition: A | Discharge status: Home / Self Care | Payer: Medicare Other | Source: Ambulatory Visit | Attending: Family Medicine | Admitting: Family Medicine

## 2019-08-22 DIAGNOSIS — Z1231 Encounter for screening mammogram for malignant neoplasm of breast: Secondary | ICD-10-CM | POA: Diagnosis not present

## 2019-08-24 DIAGNOSIS — C50919 Malignant neoplasm of unspecified site of unspecified female breast: Secondary | ICD-10-CM

## 2019-08-24 HISTORY — DX: Malignant neoplasm of unspecified site of unspecified female breast: C50.919

## 2019-08-27 ENCOUNTER — Other Ambulatory Visit: Payer: Self-pay | Admitting: Family Medicine

## 2019-08-27 DIAGNOSIS — R928 Other abnormal and inconclusive findings on diagnostic imaging of breast: Secondary | ICD-10-CM

## 2019-09-05 ENCOUNTER — Ambulatory Visit
Admission: RE | Admit: 2019-09-05 | Discharge: 2019-09-05 | Disposition: A | Discharge status: Home / Self Care | Payer: Medicare Other | Source: Ambulatory Visit | Attending: Family Medicine | Admitting: Family Medicine

## 2019-09-05 ENCOUNTER — Other Ambulatory Visit: Payer: Self-pay | Admitting: Family Medicine

## 2019-09-05 ENCOUNTER — Other Ambulatory Visit: Payer: Self-pay

## 2019-09-05 DIAGNOSIS — R928 Other abnormal and inconclusive findings on diagnostic imaging of breast: Secondary | ICD-10-CM | POA: Diagnosis not present

## 2019-09-05 DIAGNOSIS — N6310 Unspecified lump in the right breast, unspecified quadrant: Secondary | ICD-10-CM

## 2019-09-12 ENCOUNTER — Other Ambulatory Visit: Payer: Self-pay

## 2019-09-12 ENCOUNTER — Other Ambulatory Visit: Payer: Self-pay | Admitting: Family Medicine

## 2019-09-12 ENCOUNTER — Ambulatory Visit
Admission: RE | Admit: 2019-09-12 | Discharge: 2019-09-12 | Disposition: A | Discharge status: Home / Self Care | Payer: Medicare Other | Source: Ambulatory Visit | Attending: Family Medicine | Admitting: Family Medicine

## 2019-09-12 DIAGNOSIS — N6310 Unspecified lump in the right breast, unspecified quadrant: Secondary | ICD-10-CM

## 2019-09-12 DIAGNOSIS — N6315 Unspecified lump in the right breast, overlapping quadrants: Secondary | ICD-10-CM | POA: Diagnosis not present

## 2019-09-12 DIAGNOSIS — C50811 Malignant neoplasm of overlapping sites of right female breast: Secondary | ICD-10-CM | POA: Diagnosis not present

## 2019-09-16 ENCOUNTER — Telehealth: Payer: Self-pay | Admitting: Hematology

## 2019-09-16 NOTE — Telephone Encounter (Signed)
Spoke to patient to confirm afternoon [facility] appointment for 9/1, packet emailed to patient

## 2019-09-18 ENCOUNTER — Encounter: Payer: Self-pay | Admitting: *Deleted

## 2019-09-18 DIAGNOSIS — C50411 Malignant neoplasm of upper-outer quadrant of right female breast: Secondary | ICD-10-CM

## 2019-09-18 DIAGNOSIS — Z170 Estrogen receptor positive status [ER+]: Secondary | ICD-10-CM | POA: Insufficient documentation

## 2019-09-24 ENCOUNTER — Other Ambulatory Visit: Payer: Self-pay

## 2019-09-24 ENCOUNTER — Encounter: Payer: Self-pay | Admitting: Physical Therapy

## 2019-09-24 ENCOUNTER — Inpatient Hospital Stay (HOSPITAL_BASED_OUTPATIENT_CLINIC_OR_DEPARTMENT_OTHER): Payer: Medicare Other | Admitting: Genetic Counselor

## 2019-09-24 ENCOUNTER — Encounter: Payer: Self-pay | Admitting: Hematology

## 2019-09-24 ENCOUNTER — Ambulatory Visit
Admission: RE | Admit: 2019-09-24 | Discharge: 2019-09-24 | Disposition: A | Discharge status: Home / Self Care | Payer: Medicare Other | Source: Ambulatory Visit | Attending: Radiation Oncology | Admitting: Radiation Oncology

## 2019-09-24 ENCOUNTER — Inpatient Hospital Stay: Payer: Medicare Other

## 2019-09-24 ENCOUNTER — Encounter: Payer: Self-pay | Admitting: Genetic Counselor

## 2019-09-24 ENCOUNTER — Inpatient Hospital Stay: Payer: Medicare Other | Attending: Hematology | Admitting: Hematology

## 2019-09-24 ENCOUNTER — Other Ambulatory Visit: Payer: Self-pay | Admitting: General Surgery

## 2019-09-24 ENCOUNTER — Ambulatory Visit: Payer: Medicare Other | Attending: General Surgery | Admitting: Physical Therapy

## 2019-09-24 VITALS — BP 124/73 | HR 84 | Temp 97.8°F | Resp 18 | Ht 60.0 in | Wt 197.7 lb

## 2019-09-24 DIAGNOSIS — M129 Arthropathy, unspecified: Secondary | ICD-10-CM | POA: Diagnosis not present

## 2019-09-24 DIAGNOSIS — Z79899 Other long term (current) drug therapy: Secondary | ICD-10-CM | POA: Insufficient documentation

## 2019-09-24 DIAGNOSIS — Z170 Estrogen receptor positive status [ER+]: Secondary | ICD-10-CM | POA: Insufficient documentation

## 2019-09-24 DIAGNOSIS — Z800 Family history of malignant neoplasm of digestive organs: Secondary | ICD-10-CM | POA: Insufficient documentation

## 2019-09-24 DIAGNOSIS — E119 Type 2 diabetes mellitus without complications: Secondary | ICD-10-CM | POA: Insufficient documentation

## 2019-09-24 DIAGNOSIS — Z801 Family history of malignant neoplasm of trachea, bronchus and lung: Secondary | ICD-10-CM | POA: Insufficient documentation

## 2019-09-24 DIAGNOSIS — Z7984 Long term (current) use of oral hypoglycemic drugs: Secondary | ICD-10-CM | POA: Diagnosis not present

## 2019-09-24 DIAGNOSIS — I10 Essential (primary) hypertension: Secondary | ICD-10-CM | POA: Diagnosis not present

## 2019-09-24 DIAGNOSIS — C50411 Malignant neoplasm of upper-outer quadrant of right female breast: Secondary | ICD-10-CM

## 2019-09-24 DIAGNOSIS — Z7982 Long term (current) use of aspirin: Secondary | ICD-10-CM | POA: Diagnosis not present

## 2019-09-24 DIAGNOSIS — R293 Abnormal posture: Secondary | ICD-10-CM | POA: Insufficient documentation

## 2019-09-24 DIAGNOSIS — E039 Hypothyroidism, unspecified: Secondary | ICD-10-CM | POA: Insufficient documentation

## 2019-09-24 LAB — CBC WITH DIFFERENTIAL (CANCER CENTER ONLY)
Abs Immature Granulocytes: 0.02 10*3/uL (ref 0.00–0.07)
Basophils Absolute: 0.1 10*3/uL (ref 0.0–0.1)
Basophils Relative: 1 %
Eosinophils Absolute: 0.2 10*3/uL (ref 0.0–0.5)
Eosinophils Relative: 2 %
HCT: 47.3 % — ABNORMAL HIGH (ref 36.0–46.0)
Hemoglobin: 15 g/dL (ref 12.0–15.0)
Immature Granulocytes: 0 %
Lymphocytes Relative: 26 %
Lymphs Abs: 1.9 10*3/uL (ref 0.7–4.0)
MCH: 29 pg (ref 26.0–34.0)
MCHC: 31.7 g/dL (ref 30.0–36.0)
MCV: 91.3 fL (ref 80.0–100.0)
Monocytes Absolute: 0.6 10*3/uL (ref 0.1–1.0)
Monocytes Relative: 8 %
Neutro Abs: 4.7 10*3/uL (ref 1.7–7.7)
Neutrophils Relative %: 63 %
Platelet Count: 323 10*3/uL (ref 150–400)
RBC: 5.18 MIL/uL — ABNORMAL HIGH (ref 3.87–5.11)
RDW: 13.3 % (ref 11.5–15.5)
WBC Count: 7.5 10*3/uL (ref 4.0–10.5)
nRBC: 0 % (ref 0.0–0.2)

## 2019-09-24 LAB — GENETIC SCREENING ORDER

## 2019-09-24 LAB — CMP (CANCER CENTER ONLY)
ALT: 15 U/L (ref 0–44)
AST: 13 U/L — ABNORMAL LOW (ref 15–41)
Albumin: 4 g/dL (ref 3.5–5.0)
Alkaline Phosphatase: 81 U/L (ref 38–126)
Anion gap: 10 (ref 5–15)
BUN: 15 mg/dL (ref 8–23)
CO2: 25 mmol/L (ref 22–32)
Calcium: 10.7 mg/dL — ABNORMAL HIGH (ref 8.9–10.3)
Chloride: 105 mmol/L (ref 98–111)
Creatinine: 0.74 mg/dL (ref 0.44–1.00)
GFR, Est AFR Am: >60 mL/min (ref >60)
GFR, Estimated: >60 mL/min (ref >60)
Glucose, Bld: 144 mg/dL — ABNORMAL HIGH (ref 70–99)
Potassium: 3.7 mmol/L (ref 3.5–5.1)
Sodium: 140 mmol/L (ref 135–145)
Total Bilirubin: 0.5 mg/dL (ref 0.3–1.2)
Total Protein: 7.5 g/dL (ref 6.5–8.1)

## 2019-09-24 NOTE — Therapy (Signed)
[place], [place], [address] Phone: [phone]   Fax:  [phone]  Physical Therapy Evaluation  Patient Details  Name: Michelle Sherman MRN: [id number] Date of Birth: 1950-02-22 Referring Provider (PT): Dr. [name]   Encounter Date: 09/24/2019   PT End of Session - 09/24/19 1546    Visit Number 1    Number of Visits 2    Date for PT Re-Evaluation 11/19/19    PT Start Time 1324    PT Stop Time 1445    PT Time Calculation (min) 33 min    Activity Tolerance Patient tolerated treatment well    Behavior During Therapy [facility] for tasks assessed/performed           Past Medical History:  Diagnosis Date  . Allergy   . Arthritis   . Breast cancer ([place])   . Diabetes mellitus without complication ([place])   . Family history of lung cancer   . Family history of pancreatic cancer   . Hypertension   . Thyroid disease     Past Surgical History:  Procedure Laterality Date  . CHOLECYSTECTOMY    . SKIN SURGERY  12/2015   Fatty tumor removed  . TONSILLECTOMY AND ADENOIDECTOMY  1956    There were no vitals filed for this visit.    Subjective Assessment - 09/24/19 1537    Subjective Patient reports she is here today to be seen by her medical team for her newly diagnosd right breast cancer.    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 It measures 9 mm and is located in the upper outer quadrant. It is ER/PR positive and HER2 negative with a Ki67 of 5%. She cares for her husband who is in a motorized wheelchair and has bilateral BKA.    Patient Stated Goals Reduce lymphedema risk and learn post op shoulder ROM HEP    Currently in Pain? Yes    Pain Score 3     Pain Location Shoulder    Pain Orientation Right    Pain Descriptors / Indicators Aching    Pain Type Chronic pain    Pain Onset  More than a month ago    Pain Frequency Intermittent    Aggravating Factors  reaching and lifting; catching husband if he is falling    Pain Relieving Factors Rest              [facility] PT Assessment - 09/24/19 0001      Assessment   Medical Diagnosis Right breast cancer    Referring Provider (PT) Dr. [name]    Onset Date/Surgical Date 08/22/19    Hand Dominance Right    Prior Therapy None      Precautions   Precautions Other (comment)    Precaution Comments active cancer      Restrictions   Weight Bearing Restrictions No      Balance Screen   Has the patient fallen in the past 6 months No    Has the patient had a decrease in activity level because of a fear of falling?  No    Is the patient reluctant to leave their home because of a fear of falling?  No      Home Environment   Living Environment Private residence    Living Arrangements Spouse/significant other    Available Help at Discharge Family  Prior Function   Level of Independence Independent    Vocation Unemployed    Leisure She does not exercise      Cognition   Overall Cognitive Status Within Functional Limits for tasks assessed      Posture/Postural Control   Posture/Postural Control Postural limitations    Postural Limitations Forward head;Rounded Shoulders      ROM / Strength   AROM / PROM / Strength AROM;Strength      AROM   Overall AROM Comments Right cervical sidebending is limited 25%; others are WNL    AROM Assessment Site Shoulder    Right/Left Shoulder Right;Left    Right Shoulder Extension 48 Degrees    Right Shoulder Flexion 142 Degrees    Right Shoulder ABduction 140 Degrees   painful   Right Shoulder Internal Rotation 64 Degrees    Right Shoulder External Rotation 80 Degrees    Left Shoulder Extension 55 Degrees    Left Shoulder Flexion 158 Degrees    Left Shoulder ABduction 155 Degrees    Left Shoulder Internal Rotation 72 Degrees    Left Shoulder External Rotation 82  Degrees      Strength   Overall Strength Within functional limits for tasks performed             LYMPHEDEMA/ONCOLOGY QUESTIONNAIRE - 09/24/19 0001      Type   Cancer Type Right breast cancer      Lymphedema Assessments   Lymphedema Assessments Upper extremities      Right Upper Extremity Lymphedema   10 cm Proximal to Olecranon Process 36.8 cm    Olecranon Process 28 cm    10 cm Proximal to Ulnar Styloid Process 26.2 cm    Just Proximal to Ulnar Styloid Process 15.9 cm    Across Hand at [employer] 19 cm    At [place] of 2nd Digit 6.1 cm      Left Upper Extremity Lymphedema   10 cm Proximal to Olecranon Process 34.5 cm    Olecranon Process 26.6 cm    10 cm Proximal to Ulnar Styloid Process 24.4 cm    Just Proximal to Ulnar Styloid Process 15.6 cm    Across Hand at [employer] 18.9 cm    At [place] of 2nd Digit 5.9 cm           L-DEX FLOWSHEETS - 09/24/19 1500      L-DEX LYMPHEDEMA SCREENING   Measurement Type Unilateral    L-DEX MEASUREMENT EXTREMITY Upper Extremity    POSITION  Standing    DOMINANT SIDE Right    At Risk Side Right    BASELINE SCORE (UNILATERAL) -2.2           The patient was assessed using the L-Dex machine today to produce a lymphedema index baseline score. The patient will be reassessed on a regular basis (typically every 3 months) to obtain new L-Dex scores. If the score is > 6.5 points away from his/her baseline score indicating onset of subclinical lymphedema, it will be recommended to wear a compression garment for 4 weeks, 12 hours per day and then be reassessed. If the score continues to be > 6.5 points from baseline at reassessment, we will initiate lymphedema treatment. Assessing in this manner has a 95% rate of preventing clinically significant lymphedema.      Katina Dung - 09/24/19 0001    Open a tight or new jar Severe difficulty    Do heavy household chores (wash walls, wash floors)  Unable    Carry a shopping bag or  briefcase Mild difficulty    Wash your back Mild difficulty    Use a knife to cut food No difficulty    Recreational activities in which you take some force or impact through your arm, shoulder, or hand (golf, hammering, tennis) Unable    During the past week, to what extent has your arm, shoulder or hand problem interfered with your normal social activities with family, friends, neighbors, or groups? Quite a bit    During the past week, to what extent has your arm, shoulder or hand problem limited your work or other regular daily activities Quite a bit    Arm, shoulder, or hand pain. Mild    Tingling (pins and needles) in your arm, shoulder, or hand Moderate    Difficulty Sleeping Mild difficulty    DASH Score 52.27 %            Objective measurements completed on examination: See above findings.        Patient was instructed today in a home exercise program today for post op shoulder range of motion. These included active assist shoulder flexion in sitting, scapular retraction, wall walking with shoulder abduction, and hands behind head external rotation.  She was encouraged to do these twice a day, holding 3 seconds and repeating 5 times when permitted by her physician.           PT Education - 09/24/19 1545    Education Details Lymphedema risk reduction and post op shoulder ROM HEP    Person(s) Educated Patient;Spouse    Methods Explanation;Demonstration;Handout    Comprehension Returned demonstration;Verbalized understanding               PT Long Term Goals - 09/24/19 1551      PT LONG TERM GOAL #1   Title Patient will demonstrate she has regained full shoulder ROM and function post operatively compared to baselines.    Time 8    Status New    Target Date 11/19/19           Breast Clinic Goals - 09/24/19 1551      Patient will be able to verbalize understanding of pertinent lymphedema risk reduction practices relevant to her diagnosis specifically  related to skin care.   Time 1    Period Days    Status Achieved      Patient will be able to return demonstrate and/or verbalize understanding of the post-op home exercise program related to regaining shoulder range of motion.   Time 1    Period Days    Status Achieved      Patient will be able to verbalize understanding of the importance of attending the postoperative After Breast Cancer Class for further lymphedema risk reduction education and therapeutic exercise.   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 It measures 9 mm and is located in the upper outer quadrant. It is ER/PR positive and HER2 negative with a Ki67 of 5%. She cares for her husband who is in a motorized wheelchair and has bilateral BKA. Her multidisciplinary medical team met prior to her assessments to determine a recommended treatment plan. She is planning to have a right lumpectomy and  sentinel node biopsy followed by radiation and anti-estrogen therapy. She will benefit from a post op PT reassessment to determine needs and from L-Dex screens every 3 months to detect subclinical lymphedema.    Stability/Clinical Decision Making Stable/Uncomplicated    Clinical Decision Making Low    Rehab Potential Excellent    PT Frequency --   Eval and 1 f/u visit   PT Treatment/Interventions ADLs/Self Care Home Management;Therapeutic exercise;Patient/family education    PT Next Visit Plan Will reassess 3-4 weeks post op    PT Home Exercise Plan Post op shoulder ROM HEP    Consulted and Agree with Plan of Care Patient;Family member/caregiver    Family Member Consulted Husband           Patient will benefit from skilled therapeutic intervention in order to improve the following deficits and impairments:  Decreased knowledge of precautions, Pain, Postural  dysfunction, Decreased range of motion, Impaired UE functional use  Visit Diagnosis: Malignant neoplasm of upper-outer quadrant of right breast in female, estrogen receptor positive ([place]) - Plan: PT plan of care cert/re-cert  Abnormal posture - Plan: PT plan of care cert/re-cert   Patient will follow up at outpatient cancer rehab 3-4 weeks following surgery.  If the patient requires physical therapy at that time, a specific plan will be dictated and sent to the referring physician for approval. The patient was educated today on appropriate basic range of motion exercises to begin post operatively and the importance of attending the After Breast Cancer class following surgery.  Patient was educated today on lymphedema risk reduction practices as it pertains to recommendations that will benefit the patient immediately following surgery.  She verbalized good understanding.      Problem List Patient Active Problem List   Diagnosis Date Noted  . Family history of pancreatic cancer   . Family history of lung cancer   . Malignant neoplasm of upper-outer quadrant of right breast in female, estrogen receptor positive ([place]) 09/18/2019  . Incomplete uterovaginal prolapse 04/03/2019  . Need for shingles vaccine 06/27/2018  . 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 Plantar fasciitis 05/06/2017  . Controlled type 2 diabetes mellitus without complication, without long-term current use of insulin ([place]) 11/01/2016  . Hypertension associated with diabetes ([place]) 11/01/2016  . Severe obesity (BMI 35.0-35.9 with comorbidity) ([place]) 11/01/2016  . Combined hyperlipidemia associated with type 2 diabetes mellitus ([place]) 01/15/2012  . Allergic rhinitis 10/05/2011  . Ulcerative colitis without complications ([place]) 12/08/5206  . Diverticulosis of colon 10/17/2010  . Internal hemorrhoids 10/17/2010  . Acquired hypothyroidism 10/04/2010  . Osteoarthrosis of knee 10/04/2010  . OSA on CPAP 06/28/2010   Annia Friendly, PT 09/24/19 3:53 PM  [place], [place], [address] Phone: [phone]   Fax:  [phone]  Name: Michelle Sherman MRN: [id number] Date of Birth: [birth year]

## 2019-09-24 NOTE — Progress Notes (Signed)
REFERRING PROVIDER: Truitt Merle, MD [address] [place],  [place] [address]  PRIMARY PROVIDER:  Leamon Arnt, MD  PRIMARY REASON FOR VISIT:  1. Malignant neoplasm of upper-outer quadrant of right breast in female, estrogen receptor positive ([place])   2. Family history of pancreatic cancer   3. Family history of lung cancer      I connected with Ms. Wavra on 09/24/2019 at 3:00 pm EDT by video conference and verified that I am speaking with the correct person using two identifiers.   Patient location: [facility] clinic Provider location: [facility] office  HISTORY OF PRESENT ILLNESS:   Ms. Karen, a 69 y.o. female, was seen for a [place] cancer genetics consultation at the request of Dr. Burr Medico due to a personal and family history of cancer.  Ms. Chiasson presents to clinic today to discuss the possibility of a hereditary predisposition to cancer, genetic testing, and to further clarify her future cancer risks, as well as potential cancer risks for family members.   In 2021, at the age of 38, Ms. Lecker was diagnosed with invasive ductal carcinoma, ER+/PR+/Her2-, of the right breast. The treatment plan includes surgery, oncotype testing, radiation therapy, and antiestrogen therapy.    CANCER HISTORY:  Oncology History Overview Note  Cancer Staging Malignant neoplasm of upper-outer quadrant of right breast in female, estrogen receptor positive ([place]) Staging form: Breast, AJCC 8th Edition - Clinical stage from 09/12/2019: Stage IA (cT1b, cN0, cM0, G1, ER+, PR+, HER2-) - Signed by Truitt Merle, MD on 09/23/2019    Malignant neoplasm of upper-outer quadrant of right breast in female, estrogen receptor positive ([place])  09/05/2019 Mammogram   IMPRESSION: Indeterminate 9 x 6 x 6 mm mass involving the OUTER RIGHT breast at the 9 o'clock position approximately 5 cm from the nipple at POSTERIOR depth, located anterior to a normal appearing intramammary lymph Node.      09/12/2019 Cancer Staging    Staging form: Breast, AJCC 8th Edition - Clinical stage from 09/12/2019: Stage IA (cT1b, cN0, cM0, G1, ER+, PR+, HER2-) - Signed by Truitt Merle, MD on 09/23/2019   09/12/2019 Initial Biopsy   Diagnosis 1. Breast, right, needle core biopsy, 9 o'clock posterior - INVASIVE DUCTAL CARCINOMA WITH PAPILLARY FEATURES. 2. Breast, right, needle core biopsy, 9 o'clock anterior - INVASIVE DUCTAL CARCINOMA WITH PAPILLARY FEATURES. Microscopic Comment 1. - 2. The specimens share similar morphologic features. E-cadherin is positive and CK5/6 is negative. P63, Calponin and SMM-1 demonstrate the absence of myoepithelium. Ancillary studies will be reported separately. Results reported to [facility] on 09/15/2019. Intradepartmental consultation (Dr. Tresa Moore).   09/12/2019 Receptors her2   1. PROGNOSTIC INDICATORS Results: IMMUNOHISTOCHEMICAL AND MORPHOMETRIC ANALYSIS PERFORMED MANUALLY The tumor cells are NEGATIVE for Her2 (1+). Estrogen Receptor: 99%, POSITIVE, STRONG STAINING INTENSITY Progesterone Receptor: 99%, POSITIVE, STRONG STAINING INTENSITY Proliferation Marker Ki67: 5%   09/18/2019 Initial Diagnosis   Malignant neoplasm of upper-outer quadrant of right breast in female, estrogen receptor positive ([place])      RISK FACTORS:  Menarche was at age 45.  First live birth at age 54.  OCP use for approximately 3 years.  Ovaries intact: yes.  Hysterectomy: no.  Menopausal status: postmenopausal.  HRT use: 0 years. Colonoscopy: yes; around 2 cumulative polyps, per patient. Mammogram within the last year: yes. Any excessive radiation exposure in the past: no   Past Medical History:  Diagnosis Date  . Allergy   . Arthritis   . Breast cancer ([place])   .  Diabetes mellitus without complication ([place])   . Family history of lung cancer   . Family history of pancreatic cancer   . Hypertension   . Thyroid disease     Past Surgical History:  Procedure Laterality Date  .  CHOLECYSTECTOMY    . SKIN SURGERY  12/2015   Fatty tumor removed  . TONSILLECTOMY AND ADENOIDECTOMY  1956    Social History   Socioeconomic History  . Marital status: Married    Spouse name: Not on file  . Number of children: 1  . Years of education: Not on file  . Highest education level: Not on file  Occupational History  . Occupation: Retired     Comment: Oceanographer   Tobacco Use  . Smoking status: Never Smoker  . Smokeless tobacco: Never Used  Vaping Use  . Vaping Use: Never used  Substance and Sexual Activity  . Alcohol use: Yes    Comment: occa  . Drug use: No  . Sexual activity: Yes  Other Topics Concern  . Not on file  Social History Narrative   Married, cares for disabled husband,  no tob, Etoh or drug use; no exercise   Social Determinants of Health   Financial Resource Strain:   . Difficulty of Paying Living Expenses: Not on file  Food Insecurity:   . Worried About Charity fundraiser in the Last Year: Not on file  . Ran Out of Food in the Last Year: Not on file  Transportation Needs:   . Lack of Transportation (Medical): Not on file  . Lack of Transportation (Non-Medical): Not on file  Physical Activity:   . Days of Exercise per Week: Not on file  . Minutes of Exercise per Session: Not on file  Stress:   . Feeling of Stress : Not on file  Social Connections:   . Frequency of Communication with Friends and Family: Not on file  . Frequency of Social Gatherings with Friends and Family: Not on file  . Attends Religious Services: Not on file  . Active Member of Clubs or Organizations: Not on file  . Attends Archivist Meetings: Not on file  . Marital Status: Not on file     FAMILY HISTORY:  We obtained a detailed, 4-generation family history.  Significant diagnoses are listed below: Family History  Problem Relation Age of Onset  . Ulcerative colitis Mother   . Diabetes Mother   . Heart disease Father   . Lung cancer Father 26       Lung   . Varicose Veins Sister   . Other Sister        "stiff heart"  . Diabetes Brother   . Pancreatic cancer Brother 38       Pancreatic  . Diabetes Maternal Aunt   . Diabetes Maternal Uncle   . Heart attack Paternal Grandfather   . Crohn's disease Brother   . Ulcerative colitis Brother   . Diabetes Brother   . Cancer Brother        unsure of type  . Lung cancer Paternal Aunt        smoker  . Cancer Paternal Aunt        unknown type    Ms. Azure has one son Aaron Edelman, age 66) and one grandson (age 71). She has two brothers and one sister. Her oldest brother was diagnosed with pancreatic cancer initially at the age of 9. Her other brother was recently diagnosed with cancer, although she is  unsure what type of cancer this may be.  Ms. Teschner's mother died at the age of 61 and did not have cancer. Ms. Doyel had one maternal aunt and one maternal uncle. Her maternal grandmother died in her 16s, and her maternal grandfather died at the age of 70 from a heart attack. There are no known diagnoses of cancer on the maternal side of the family.  Ms. Herrmann's father died at the age of 90 from lung cancer diagnosed at age 4. She had two paternal uncles and five paternal aunts. One uncle died at the age of 48 in a hunting accident. She believes that one or two of her paternal aunts may have had cancer. One had lung cancer and was a smoker, and she is not sure what type of cancer the other aunt may have had. Her paternal grandmother died at the age of 42, and her paternal grandfather died at the age of 79 from a heart attack. There are no other known diagnoses of cancer on the paternal side of the family.  Ms. Hertzog is unaware of previous family history of genetic testing for hereditary cancer risks. Patient's maternal ancestors are of [place] and [place] descent, and paternal ancestors are of [place] descent. Ancestry genetic testing also revealed [place] ancestry. There is no reported Ashkenazi  Jewish ancestry. There is no known consanguinity.  GENETIC COUNSELING ASSESSMENT: Ms. Hedberg is a 69 y.o. female with a personal history of breast cancer and a family history of pancreatic cancer, which is somewhat suggestive of a hereditary cancer syndrome and predisposition to cancer. We, therefore, discussed and recommended the following at today's visit.   DISCUSSION: We discussed that approximately 5-10% of breast cancer is hereditary, with most cases associated with the BRCA1 and BRCA2 genes. There are other genes that can be associated with hereditary breast cancer syndromes. These include ATM, CHEK2, PALB2, etc. We discussed that testing is beneficial for several reasons, including knowing about other cancer risks, identifying potential screening and risk-reduction options that may be appropriate, and to understand if other family members could be at risk for cancer and allow them to undergo genetic testing.   We reviewed the characteristics, features and inheritance patterns of hereditary cancer syndromes. We also discussed genetic testing, including the appropriate family members to test, the process of testing, insurance coverage and turn-around-time for results. We discussed the implications of a negative, positive and/or variant of uncertain significant result. In order to get genetic test results in a timely manner so that Ms. Speece can use these genetic test results for surgical decisions, we recommended Ms. Santacroce pursue genetic testing for the [facility] Breast Cancer STAT panel. Once complete, we recommend Ms. Aguas pursue reflex genetic testing to the Common Hereditary Cancers panel.   The Breast Cancer STAT Panel offered by [facility] includes sequencing and deletion/duplication analysis for the following 9 genes:  ATM, BRCA1, BRCA2, CDH1, CHEK2, PALB2, PTEN, STK11 and TP53. The Common Hereditary Cancers Panel offered by [facility] includes sequencing and/or deletion duplication testing of  the following 48 genes: APC, ATM, AXIN2, BARD1, BMPR1A, BRCA1, BRCA2, BRIP1, CDH1, CDK4, CDKN2A (p14ARF), CDKN2A (p16INK4a), CHEK2, CTNNA1, DICER1, EPCAM (Deletion/duplication testing only), GREM1 (promoter region deletion/duplication testing only), KIT, MEN1, MLH1, MSH2, MSH3, MSH6, MUTYH, NBN, NF1, NTHL1, PALB2, PDGFRA, PMS2, POLD1, POLE, PTEN, RAD50, RAD51C, RAD51D, RNF43, SDHB, SDHC, SDHD, SMAD4, SMARCA4. STK11, TP53, TSC1, TSC2, and VHL.  The following genes are evaluated for sequence changes only: SDHA and HOXB13 c.251G>A variant only.  Based on  Ms. Curley's personal and family history of cancer, she meets medical criteria for genetic testing. Despite that she meets criteria, she may still have an out of pocket cost.   PLAN: After considering the risks, benefits, and limitations, Ms. Hovater provided informed consent to pursue genetic testing and the blood sample was sent to [facility] for analysis of the [facility] Breast Cancer STAT panel + Common Hereditary Cancers panel. Results should be available within approximately one-two weeks' time, at which point they will be disclosed by telephone to Ms. Baehr, as will any additional recommendations warranted by these results. Ms. Kaczmarek will receive a summary of her genetic counseling visit and a copy of her results once available. This information will also be available in Epic.   Ms. Sorn questions were answered to her satisfaction today. Our contact information was provided should additional questions or concerns arise. Thank you for the referral and allowing [place] to share in the care of your patient.   Clint Guy, [place], [facility] Licensed, Certified Dispensing [email or web address] Phone: [phone]  The patient was seen for a total of 25 minutes in face-to-face genetic counseling.  This patient was discussed with Drs. Magrinat, Lindi Adie and/or Burr Medico who agrees with the above.     _______________________________________________________________________ For Office Staff:  Number of people involved in session: 1 Was an Intern/ student involved with case: no

## 2019-09-24 NOTE — Progress Notes (Signed)
[place]   [phone] Fax:([phone]) [place] Note   Patient Care Team: Leamon Arnt, MD as PCP - General (Family Medicine) Deneise Lever, MD as Consulting Physician (Pulmonary Disease) Juanita Craver, MD as Consulting Physician (Gastroenterology) Lyndee Hensen, PT as Physical Therapist (Physical Therapy) Trula Slade, DPM as Consulting Physician (Podiatry) Katy Apo, MD as Consulting Physician (Ophthalmology) Rockwell Germany, RN as Oncology Nurse Navigator Mauro Kaufmann, RN as Oncology Nurse Navigator [name], MD as Consulting Physician (General Surgery) Truitt Merle, MD as Consulting Physician (Hematology) Kyung Rudd, MD as Consulting Physician (Radiation Oncology)  Date of Service:  09/24/2019   CHIEF COMPLAINTS/PURPOSE OF CONSULTATION:  Newly diagnosed Malignant neoplasm of upper-outer quadrant of right breast   Oncology History Overview Note  Cancer Staging Malignant neoplasm of upper-outer quadrant of right breast in female, estrogen receptor positive ([place]) Staging form: Breast, AJCC 8th Edition - Clinical stage from 09/12/2019: Stage IA (cT1b, cN0, cM0, G1, ER+, PR+, HER2-) - Signed by Truitt Merle, MD on 09/23/2019    Malignant neoplasm of upper-outer quadrant of right breast in female, estrogen receptor positive ([place])  09/05/2019 Mammogram   IMPRESSION: Indeterminate 9 x 6 x 6 mm mass involving the OUTER RIGHT breast at the 9 o'clock position approximately 5 cm from the nipple at POSTERIOR depth, located anterior to a normal appearing intramammary lymph Node.      09/12/2019 Cancer Staging   Staging form: Breast, AJCC 8th Edition - Clinical stage from 09/12/2019: Stage IA (cT1b, cN0, cM0, G1, ER+, PR+, HER2-) - Signed by Truitt Merle, MD on 09/23/2019   09/12/2019 Initial Biopsy   Diagnosis 1. Breast, right, needle core biopsy, 9 o'clock posterior - INVASIVE DUCTAL CARCINOMA WITH PAPILLARY FEATURES. 2.  Breast, right, needle core biopsy, 9 o'clock anterior - INVASIVE DUCTAL CARCINOMA WITH PAPILLARY FEATURES. Microscopic Comment 1. - 2. The specimens share similar morphologic features. E-cadherin is positive and CK5/6 is negative. P63, Calponin and SMM-1 demonstrate the absence of myoepithelium. Ancillary studies will be reported separately. Results reported to [facility] on 09/15/2019. Intradepartmental consultation (Dr. Tresa Moore).   09/12/2019 Receptors her2   1. PROGNOSTIC INDICATORS Results: IMMUNOHISTOCHEMICAL AND MORPHOMETRIC ANALYSIS PERFORMED MANUALLY The tumor cells are NEGATIVE for Her2 (1+). Estrogen Receptor: 99%, POSITIVE, STRONG STAINING INTENSITY Progesterone Receptor: 99%, POSITIVE, STRONG STAINING INTENSITY Proliferation Marker Ki67: 5%   09/18/2019 Initial Diagnosis   Malignant neoplasm of upper-outer quadrant of right breast in female, estrogen receptor positive ([place])      HISTORY OF PRESENTING ILLNESS:  Michelle Sherman 69 y.o. female is a here because of newly diagnosed right breast cancer. The patient presents to the Breast clinic today accompanied by her husband.   Her right breast mass was found by screening mammogram. She did not feel her breast mass herself. She has had yearly mammograms and notes this is her first abnormal mammogram. She denies and breast, nipple or body changes recently.   Socially she is married with one adult son. Her husband has multiple medical issues and she is his care giver. She occasionally drinks alcohol and is a non smoker. She is retired.  She has a PMHx of HTN, DM, hypothyroidism which are well controlled. I reviewed her medication list with her. Per pt her last DEXA 1-2 years ago was normal. She had cholecystotomy, tonsillectomy ad lipoma removed. Her father had lung cancer, her brother has recurrent pancreatic cancer.    GYN HISTORY  Menarchal:  12 LMP: 2001, age 53. She has manageable menopause.    Contraceptive: BC pills for 3 years in her 49s HRT: No G1P1: first at age 14   REVIEW OF SYSTEMS:    Constitutional: Denies fevers, chills or abnormal night sweats Eyes: Denies blurriness of vision, double vision or watery eyes Ears, nose, mouth, throat, and face: Denies mucositis or sore throat Respiratory: Denies cough, dyspnea or wheezes Cardiovascular: Denies palpitation, chest discomfort or lower extremity swelling Gastrointestinal:  Denies nausea, heartburn or change in bowel habits Skin: Denies abnormal skin rashes Lymphatics: Denies new lymphadenopathy or easy bruising Neurological:Denies numbness, tingling or new weaknesses Behavioral/Psych: Mood is stable, no new changes  All other systems were reviewed with the patient and are negative.   MEDICAL HISTORY:  Past Medical History:  Diagnosis Date  . Allergy   . Arthritis   . Breast cancer ([place])   . Diabetes mellitus without complication ([place])   . Hypertension   . Thyroid disease     SURGICAL HISTORY: Past Surgical History:  Procedure Laterality Date  . CHOLECYSTECTOMY    . SKIN SURGERY  12/2015   Fatty tumor removed  . TONSILLECTOMY AND ADENOIDECTOMY  1956    SOCIAL HISTORY: Social History   Socioeconomic History  . Marital status: Married    Spouse name: Not on file  . Number of children: 1  . Years of education: Not on file  . Highest education level: Not on file  Occupational History  . Occupation: Retired     Comment: Oceanographer   Tobacco Use  . Smoking status: Never Smoker  . Smokeless tobacco: Never Used  Vaping Use  . Vaping Use: Never used  Substance and Sexual Activity  . Alcohol use: Yes    Comment: occa  . Drug use: No  . Sexual activity: Yes  Other Topics Concern  . Not on file  Social History Narrative   Married, cares for disabled husband,  no tob, Etoh or drug use; no exercise   Social Determinants of Health   Financial Resource Strain:   . Difficulty of Paying Living  Expenses: Not on file  Food Insecurity:   . Worried About Charity fundraiser in the Last Year: Not on file  . Ran Out of Food in the Last Year: Not on file  Transportation Needs:   . Lack of Transportation (Medical): Not on file  . Lack of Transportation (Non-Medical): Not on file  Physical Activity:   . Days of Exercise per Week: Not on file  . Minutes of Exercise per Session: Not on file  Stress:   . Feeling of Stress : Not on file  Social Connections:   . Frequency of Communication with Friends and Family: Not on file  . Frequency of Social Gatherings with Friends and Family: Not on file  . Attends Religious Services: Not on file  . Active Member of Clubs or Organizations: Not on file  . Attends Archivist Meetings: Not on file  . Marital Status: Not on file  Intimate Partner Violence:   . Fear of Current or Ex-Partner: Not on file  . Emotionally Abused: Not on file  . Physically Abused: Not on file  . Sexually Abused: Not on file    FAMILY HISTORY: Family History  Problem Relation Age of Onset  . Ulcerative colitis Mother   . Diabetes Mother   . Cancer Father        Lung  . Heart disease Father   .  Varicose Veins Sister   . Other Sister        "stiff heart"  . Cancer Brother        Pancreatic  . Diabetes Brother   . Diabetes Maternal Aunt   . Diabetes Maternal Uncle   . Heart attack Paternal Grandfather   . Crohn's disease Brother   . Ulcerative colitis Brother   . Diabetes Brother     ALLERGIES:  is allergic to nickel and sulfa antibiotics.  MEDICATIONS:  Current Outpatient Medications  Medication Sig Dispense Refill  . [place]-Glucosamine-Vit D (OSTEO BI-FLEX ONE PER DAY PO) Take by mouth daily.    . famotidine (PEPCID) 40 MG tablet Take 40 mg by mouth 2 (two) times daily.    Marland Kitchen aspirin EC 81 MG tablet Take 1 tablet by mouth daily.    Marland Kitchen atorvastatin (LIPITOR) 10 MG tablet Take 1 tablet (10 mg total) by mouth every evening. 90 tablet 3  .  canagliflozin (INVOKANA) 300 MG TABS tablet Take 1 tablet (300 mg total) by mouth daily. 90 tablet 3  . Cholecalciferol (VITAMIN D3) 2000 units capsule Take 1 capsule by mouth daily.    Marland Kitchen levothyroxine (SYNTHROID) 112 MCG tablet Take 1 tablet (112 mcg total) by mouth daily. 90 tablet 3  . mesalamine (LIALDA) 1.2 g EC tablet Take by mouth. Take 2 tablet in am and 2 tablet in pm    . metFORMIN (GLUCOPHAGE) 1000 MG tablet Take 1 tablet (1,000 mg total) by mouth 2 (two) times daily with a meal. 180 tablet 3  . Misc Natural Products (TURMERIC CURCUMIN) CAPS Take 1 capsule by mouth daily.    . Multiple Vitamin (MULTIVITAMIN) tablet Take 1 tablet by mouth daily.    . ramipril (ALTACE) 10 MG capsule Take 1 capsule (10 mg total) by mouth daily. 90 capsule 3   No current facility-administered medications for this visit.    PHYSICAL EXAMINATION: ECOG PERFORMANCE STATUS: 0 - Asymptomatic  Vitals:   09/24/19 1239  BP: 124/73  Pulse: 84  Resp: 18  Temp: 97.8 F (36.6 C)  SpO2: 98%   Filed Weights   09/24/19 1239  Weight: 197 lb 11.2 oz (89.7 kg)    GENERAL:alert, no distress and comfortable SKIN: skin color, texture, turgor are normal, no rashes or significant lesions EYES: normal, Conjunctiva are pink and non-injected, sclera clear  NECK: supple, thyroid normal size, non-tender, without nodularity LYMPH:  no palpable lymphadenopathy in the cervical, axillary  LUNGS: clear to auscultation and percussion with normal breathing effort HEART: regular rate & rhythm and no murmurs and no lower extremity edema ABDOMEN:abdomen soft, non-tender and normal bowel sounds Musculoskeletal:no cyanosis of digits and no clubbing  NEURO: alert & oriented x 3 with fluent speech, no focal motor/sensory deficits BREAST: (+) Very mild skin ecchymosis of right breast at biopsy site with mild subcutaneous knot. Left Breast exam benign.  LABORATORY DATA:  I have reviewed the data as listed CBC Latest Ref Rng &  Units 09/24/2019 01/01/2019 12/24/2017  WBC 4.0 - 10.5 K/uL 7.5 6.7 6.6  Hemoglobin 12.0 - 15.0 g/dL 15.0 14.6 15.0  Hematocrit 36 - 46 % 47.3(H) 44.3 45.6  Platelets 150 - 400 K/uL 323 289.0 329.0    CMP Latest Ref Rng & Units 09/24/2019 01/01/2019 12/24/2017  Glucose 70 - 99 mg/dL 144(H) 140(H) 122(H)  BUN 8 - 23 mg/dL 15 14 17   Creatinine 0.44 - 1.00 mg/dL 0.74 0.60 0.64  Sodium 135 - 145 mmol/L 140 138 138  Potassium 3.5 - 5.1 mmol/L 3.7 4.3 4.5  Chloride 98 - 111 mmol/L 105 101 101  CO2 22 - 32 mmol/L 25 29 28   Calcium 8.9 - 10.3 mg/dL 10.7(H) 9.9 10.1  Total Protein 6.5 - 8.1 g/dL 7.5 6.9 7.5  Total Bilirubin 0.3 - 1.2 mg/dL 0.5 0.5 0.4  Alkaline Phos 38 - 126 U/L 81 81 70  AST 15 - 41 U/L 13(L) 13 14  ALT 0 - 44 U/L 15 15 16      RADIOGRAPHIC STUDIES: I have personally reviewed the radiological images as listed and agreed with the findings in the report. US BREAST LTD UNI RIGHT INC AXILLA  Result Date: 09/05/2019 CLINICAL DATA:  Recall from screening mammography with tomosynthesis, possible developing asymmetry involving the retroareolar RIGHT breast at far POSTERIOR depth, visible only on the MLO images. EXAM: DIGITAL DIAGNOSTIC RIGHT MAMMOGRAM WITH CAD AND TOMO ULTRASOUND RIGHT BREAST COMPARISON:  Previous exam(s). ACR Breast Density Category b: There are scattered areas of fibroglandular density. FINDINGS: Tomosynthesis and synthesized spot compression MLO view of the area of concern in the RIGHT breast and a tomosynthesis and synthesized mediolateral view of the RIGHT breast were obtained. The asymmetry in the retroareolar location at posterior depth persists though partially disperses on the spot compression images. The asymmetry is immediately adjacent to a likely normal intramammary lymph node which has been present multiple prior examinations. No suspicious findings elsewhere in the RIGHT breast on the full field mediolateral image. A circumscribed isodense mass in the UPPER breast  at anterior depth has been stable over multiple prior mammograms. The full field mediolateral image was processed with CAD. Targeted RIGHT breast ultrasound is performed, showing a heterogeneous though predominantly hypoechoic mass with irregular margins at the 9 o'clock position approximately 5 cm from the nipple measuring approximately 9 x 6 x 6 mm, demonstrating mixed posterior characteristics and demonstrating no internal power Doppler flow, corresponding to the screening mammographic developing asymmetry (as it is located anterior to a normal-appearing 8 mm intramammary lymph node at the 9 o'clock position 5 cm from the nipple). IMPRESSION: Indeterminate 9 mm mass involving the OUTER RIGHT breast at the 9 o'clock position approximately 5 cm from the nipple at POSTERIOR depth, located anterior to a normal appearing intramammary lymph node. RECOMMENDATION: Ultrasound-guided core needle biopsy of the indeterminate RIGHT breast mass. The ultrasound core needle biopsy procedure was discussed with patient and her questions were answered. She wishes to proceed with the biopsy which has been scheduled at her convenience. I have discussed the findings and recommendations with the patient. BI-RADS CATEGORY  4: Suspicious. Electronically Signed   By: Evangeline Dakin M.D.   On: 09/05/2019 11:33   MM DIAG BREAST TOMO UNI RIGHT  Result Date: 09/05/2019 CLINICAL DATA:  Recall from screening mammography with tomosynthesis, possible developing asymmetry involving the retroareolar RIGHT breast at far POSTERIOR depth, visible only on the MLO images. EXAM: DIGITAL DIAGNOSTIC RIGHT MAMMOGRAM WITH CAD AND TOMO ULTRASOUND RIGHT BREAST COMPARISON:  Previous exam(s). ACR Breast Density Category b: There are scattered areas of fibroglandular density. FINDINGS: Tomosynthesis and synthesized spot compression MLO view of the area of concern in the RIGHT breast and a tomosynthesis and synthesized mediolateral view of the RIGHT breast  were obtained. The asymmetry in the retroareolar location at posterior depth persists though partially disperses on the spot compression images. The asymmetry is immediately adjacent to a likely normal intramammary lymph node which has been present multiple prior examinations. No suspicious findings elsewhere in the  RIGHT breast on the full field mediolateral image. A circumscribed isodense mass in the UPPER breast at anterior depth has been stable over multiple prior mammograms. The full field mediolateral image was processed with CAD. Targeted RIGHT breast ultrasound is performed, showing a heterogeneous though predominantly hypoechoic mass with irregular margins at the 9 o'clock position approximately 5 cm from the nipple measuring approximately 9 x 6 x 6 mm, demonstrating mixed posterior characteristics and demonstrating no internal power Doppler flow, corresponding to the screening mammographic developing asymmetry (as it is located anterior to a normal-appearing 8 mm intramammary lymph node at the 9 o'clock position 5 cm from the nipple). IMPRESSION: Indeterminate 9 mm mass involving the OUTER RIGHT breast at the 9 o'clock position approximately 5 cm from the nipple at POSTERIOR depth, located anterior to a normal appearing intramammary lymph node. RECOMMENDATION: Ultrasound-guided core needle biopsy of the indeterminate RIGHT breast mass. The ultrasound core needle biopsy procedure was discussed with patient and her questions were answered. She wishes to proceed with the biopsy which has been scheduled at her convenience. I have discussed the findings and recommendations with the patient. BI-RADS CATEGORY  4: Suspicious. Electronically Signed   By: Evangeline Dakin M.D.   On: 09/05/2019 11:33   MM CLIP PLACEMENT RIGHT  Result Date: 09/12/2019 CLINICAL DATA:  Status post ultrasound-guided core biopsies of the right breast. EXAM: 3D DIAGNOSTIC RIGHT MAMMOGRAM POST ULTRASOUND BIOPSIES COMPARISON:  Previous  exam(s). FINDINGS: Mammographic images were obtained following ultrasound guided biopsies of the right breast. There is a ribbon shaped clip in the 9 o'clock region of the right breast (posterior) and a coil shaped clip in the 9 o'clock region of the right breast (anterior). The clips are located 3.2 cm apart. IMPRESSION: Appropriate positioning of the ribbon and coil shaped clips in the lateral aspect of the right breast. Final Assessment: Post Procedure Mammograms for Marker Placement Electronically Signed   By: Lillia Mountain M.D.   On: 09/12/2019 11:11   [place] RT BREAST BX W LOC DEV 1ST LESION IMG BX SPEC US GUIDE  Addendum Date: 09/22/2019   ADDENDUM REPORT: 09/16/2019 13:49 : ADDENDUM: Pathology revealed INVASIVE DUCTAL CARCINOMA WITH PAPILLARY FEATURES of the RIGHT breast, 9 o'clock posterior. This was found to be concordant by Dr. Lillia Mountain. Pathology revealed INVASIVE DUCTAL CARCINOMA WITH PAPILLARY FEATURES of the RIGHT breast, 9 o'clock anterior. This was found to be concordant by Dr. Lillia Mountain. Pathology results were discussed with the patient by telephone. The patient reported doing well after the biopsies with tenderness at the sites. Post biopsy instructions and care were reviewed and questions were answered. The patient was encouraged to call [facility] for any additional concerns. The patient was referred to [facility] on September 24, 2019. Pathology results reported by Stacie Acres RN on 09/16/2019. Electronically Signed   By: Lillia Mountain M.D.   On: 09/16/2019 13:49   Result Date: 09/22/2019 CLINICAL DATA:  Suspicious mass in the 9 o'clock region of the right breast and intramammary lymph node in the 9 o'clock region of the right breast. Ultrasound-guided core biopsies performed. EXAM: ULTRASOUND GUIDED right BREAST CORE NEEDLE BIOPSIES COMPARISON:  Previous exam(s). PROCEDURE: I met with the  patient and we discussed the procedure of ultrasound-guided biopsy, including benefits and alternatives. We discussed the high likelihood of a successful procedure. We discussed the risks of the procedure, including infection, bleeding, tissue injury, clip migration,  and inadequate sampling. Informed written consent was given. The usual time-out protocol was performed immediately prior to the procedure. Lesion quadrant: 9 o'clock 5 cm from the nipple (posterior) Using sterile technique and 1% Lidocaine as local anesthetic, under direct ultrasound visualization, a 14 gauge spring-loaded device was used to perform biopsy of a mass in the 9 o'clock region of the right breast 5 cm from the nipple (posterior) using a lateral to medial approach. Prior to the biopsy a ribbon shaped tissue marker clip was deployed at the posterior margin of the mass. Follow up 2 view mammogram was performed and dictated separately. I met with the patient and we discussed the procedure of ultrasound-guided biopsy, including benefits and alternatives. We discussed the high likelihood of a successful procedure. We discussed the risks of the procedure, including infection, bleeding, tissue injury, clip migration, and inadequate sampling. Informed written consent was given. The usual time-out protocol was performed immediately prior to the procedure. Lesion quadrant: 9 o'clock 5 cm from the nipple (anterior) Using sterile technique and 1% Lidocaine as local anesthetic, under direct ultrasound visualization, a 14 gauge spring-loaded device was used to perform biopsy of a mass in the 9 o'clock region of the right breast 5 cm from the nipple (anterior) using a lateral to medial approach. Prior to the biopsy a coil shaped tissue marker clip was deployed at the posterior margin of the mass. Follow up 2 view mammogram was performed and dictated separately. IMPRESSION: Ultrasound guided biopsies of the right breast. No apparent complications.  Electronically Signed: By: Lillia Mountain M.D. On: 09/12/2019 11:04   [place] RT BREAST BX W LOC DEV EA ADD LESION IMG BX SPEC US GUIDE  Addendum Date: 09/22/2019   ADDENDUM REPORT: 09/16/2019 13:49 : ADDENDUM: Pathology revealed INVASIVE DUCTAL CARCINOMA WITH PAPILLARY FEATURES of the RIGHT breast, 9 o'clock posterior. This was found to be concordant by Dr. Lillia Mountain. Pathology revealed INVASIVE DUCTAL CARCINOMA WITH PAPILLARY FEATURES of the RIGHT breast, 9 o'clock anterior. This was found to be concordant by Dr. Lillia Mountain. Pathology results were discussed with the patient by telephone. The patient reported doing well after the biopsies with tenderness at the sites. Post biopsy instructions and care were reviewed and questions were answered. The patient was encouraged to call [facility] for any additional concerns. The patient was referred to [facility] on September 24, 2019. Pathology results reported by Stacie Acres RN on 09/16/2019. Electronically Signed   By: Lillia Mountain M.D.   On: 09/16/2019 13:49   Result Date: 09/22/2019 CLINICAL DATA:  Suspicious mass in the 9 o'clock region of the right breast and intramammary lymph node in the 9 o'clock region of the right breast. Ultrasound-guided core biopsies performed. EXAM: ULTRASOUND GUIDED right BREAST CORE NEEDLE BIOPSIES COMPARISON:  Previous exam(s). PROCEDURE: I met with the patient and we discussed the procedure of ultrasound-guided biopsy, including benefits and alternatives. We discussed the high likelihood of a successful procedure. We discussed the risks of the procedure, including infection, bleeding, tissue injury, clip migration, and inadequate sampling. Informed written consent was given. The usual time-out protocol was performed immediately prior to the procedure. Lesion quadrant: 9 o'clock 5 cm from the nipple (posterior) Using sterile technique  and 1% Lidocaine as local anesthetic, under direct ultrasound visualization, a 14 gauge spring-loaded device was used to perform biopsy of a mass in the 9 o'clock region of the right breast 5 cm  from the nipple (posterior) using a lateral to medial approach. Prior to the biopsy a ribbon shaped tissue marker clip was deployed at the posterior margin of the mass. Follow up 2 view mammogram was performed and dictated separately. I met with the patient and we discussed the procedure of ultrasound-guided biopsy, including benefits and alternatives. We discussed the high likelihood of a successful procedure. We discussed the risks of the procedure, including infection, bleeding, tissue injury, clip migration, and inadequate sampling. Informed written consent was given. The usual time-out protocol was performed immediately prior to the procedure. Lesion quadrant: 9 o'clock 5 cm from the nipple (anterior) Using sterile technique and 1% Lidocaine as local anesthetic, under direct ultrasound visualization, a 14 gauge spring-loaded device was used to perform biopsy of a mass in the 9 o'clock region of the right breast 5 cm from the nipple (anterior) using a lateral to medial approach. Prior to the biopsy a coil shaped tissue marker clip was deployed at the posterior margin of the mass. Follow up 2 view mammogram was performed and dictated separately. IMPRESSION: Ultrasound guided biopsies of the right breast. No apparent complications. Electronically Signed: By: Lillia Mountain M.D. On: 09/12/2019 11:04    ASSESSMENT & PLAN:  Michelle Sherman is a 69 y.o. Caucasian female with a history of DM, HTN, hypothyroidism    1. Malignant neoplasm of upper-outer quadrant of right breast, invasive ductal carcinoma, Stage 1A, c(T1bN0M0), ER+/PR+/HER2-, Grade I  -We discussed her image findings and the biopsy results in great details. She has a 66m mass in right breast with invasive ductal carcinoma.  -Given the early stage disease,  she likely need a right lumpectomy with or without sentinel lymph node biopsy. She is agreeable with that. She was seen by Dr. BBarry Dienestoday and likely will proceed with surgery soon.  -For Tumor size more than 1cm, I recommend a Oncotype Dx test on the surgical sample and we'll make a decision about adjuvant chemotherapy based on the Oncotype result. Written material of this test was given to her.  This is likely low risk disease.  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  Patient is interested in knowing her risk of recurrence and agrees with Oncotype test. -If her surgical sentinel lymph node positive, I recommend mammaprint for further risk stratification and guide adjuvant chemotherapy. -She was also seen by radiation oncologist Dr. MLisbeth Renshawtoday.Adjuvant radiation is recommended to reduce the risk for local recurrence.  -Given the strong ER and PR expression in her postmenopausal status, I recommend adjuvant endocrine therapy with aromatase inhibitor with Anastrozole for a total of 5-10 years to reduce the risk of cancer recurrence. Potential benefits and side effects were discussed with patient and she is interested. -We also discussed the breast cancer surveillance after her surgery. She will continue annual screening mammogram, self exam, and a routine office visit with lab and exam with [place] -I encouraged her to have healthy diet and exercise regularly -Labs reviewed, CBC and CMP WNL except Hct 47.3%,  BG 144, Ca 10.7, AST 13. Her initial breast exam was negative overall.  -F/u after surgery or Radiation    2. Genetic Testing  -Based on her family history of cancer, she is eligible for genetic testing. She is agreeable. I will send referral.    3. Comorbidities: HTN, DM, Hypothyroidism  -Well controlled on Medications.  -Continue to f/u with PCP for management    4. Bone  Health  -Her 08/07/17 DEXA was normal   PLAN:  -Send  genetic referral  -Proceed with surgery soon  -Oncotype if tumor>1cm on surgical sample  -F/u after surgery or Radiation   Orders Placed This Encounter  Procedures  . Ambulatory referral to Genetics    Referral Priority:   Routine    Referral Type:   Consultation    Referral Reason:   Specialty Services Required    Number of Visits Requested:   1    All questions were answered. The patient knows to call the clinic with any problems, questions or concerns. The total time spent in the appointment was 50 minutes.     Truitt Merle, MD 09/24/2019 3:24 PM  I, Joslyn Devon, am acting as scribe for Truitt Merle, MD.   I have reviewed the above documentation for accuracy and completeness, and I agree with the above.

## 2019-09-24 NOTE — Patient Instructions (Signed)

## 2019-09-24 NOTE — Progress Notes (Signed)
Radiation Oncology         [phone] ________________________________  Name: Michelle Sherman        MRN: [id number]  Date of Service: 09/24/2019 DOB: November 23, 1950  CC:Michelle Arnt, MD  Michelle Klein, MD     REFERRING PHYSICIAN: [name], MD   DIAGNOSIS: The encounter diagnosis was Malignant neoplasm of upper-outer quadrant of right breast in female, estrogen receptor positive ([place]).   HISTORY OF PRESENT ILLNESS: Michelle Sherman is a 69 y.o. female seen in the multidisciplinary breast clinic for a new diagnosis of right breast cancer. The patient was noted to have a screening detected right breast mass. This was seen at 9:00 measuring 9 x 6 x 6 mm with an 8 mm intramammary abnormality, possibly a node. 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 Her cancer was ER/PR positive, HER2 was negative with a Ki 67 of 5%. She comes today to discuss treatment options for her cancer.     PREVIOUS RADIATION THERAPY: No   PAST MEDICAL HISTORY:  Past Medical History:  Diagnosis Date  . Allergy   . Arthritis   . Breast cancer ([place])   . Diabetes mellitus without complication ([place])   . Hypertension   . Thyroid disease        PAST SURGICAL HISTORY: Past Surgical History:  Procedure Laterality Date  . CHOLECYSTECTOMY    . SKIN SURGERY  12/2015   Fatty tumor removed  . TONSILLECTOMY AND ADENOIDECTOMY  1956     FAMILY HISTORY:  Family History  Problem Relation Age of Onset  . Ulcerative colitis Mother   . Diabetes Mother   . Cancer Father        Lung  . Heart disease Father   . Varicose Veins Sister   . Other Sister        "stiff heart"  . Cancer Brother        Pancreatic  . Diabetes Brother   . Diabetes Maternal Aunt   . Diabetes Maternal Uncle   . Heart attack Paternal Grandfather   . Crohn's disease Brother   .  Ulcerative colitis Brother   . Diabetes Brother      SOCIAL HISTORY:  reports that she has never smoked. She has never used smokeless tobacco. She reports current alcohol use. She reports that she does not use drugs. The patient is married and lives in [place]. She has a grandson she helps care for. Her husband is disabled and she also is his main caregiver.   ALLERGIES: Nickel and Sulfa antibiotics   MEDICATIONS:  Current Outpatient Medications  Medication Sig Dispense Refill  . aspirin EC 81 MG tablet Take 1 tablet by mouth daily.    Marland Kitchen atorvastatin (LIPITOR) 10 MG tablet Take 1 tablet (10 mg total) by mouth every evening. 90 tablet 3  . BIOTIN 5000 PO Take 1 tablet by mouth daily.    . Calcium Citrate-Vitamin D 200-250 MG-UNIT TABS Take 1 tablet by mouth daily.    . canagliflozin (INVOKANA) 300 MG TABS tablet Take 1 tablet (300 mg total) by mouth daily. 90 tablet 3  . Cholecalciferol (VITAMIN D3) 2000 units capsule Take 1 capsule by mouth daily.    . diclofenac sodium (VOLTAREN) 1 % GEL Apply 2 g topically 4 (four) times daily. Rub into affected area of foot 2 to 4 times  daily 100 g 2  . Exenatide ER (BYDUREON BCISE) 2 MG/0.85ML AUIJ Inject 2 mg into the skin once a week. 4 pen 11  . fexofenadine (ALLEGRA) 180 MG tablet Take 1 tablet by mouth daily.    . fluticasone (FLONASE) 50 MCG/ACT nasal spray Place 2 sprays into the nose daily as needed.    Marland Kitchen glucosamine-chondroitin 500-400 MG tablet Take 1 tablet by mouth 3 (three) times daily.    Marland Kitchen levothyroxine (SYNTHROID) 112 MCG tablet Take 1 tablet (112 mcg total) by mouth daily. 90 tablet 3  . mesalamine (LIALDA) 1.2 g EC tablet Take by mouth. Take 2 tablet in am and 2 tablet in pm    . metFORMIN (GLUCOPHAGE) 1000 MG tablet Take 1 tablet (1,000 mg total) by mouth 2 (two) times daily with a meal. 180 tablet 3  . Misc Natural Products (TURMERIC CURCUMIN) CAPS Take 1 capsule by mouth daily.    . Multiple Vitamin (MULTIVITAMIN) tablet Take 1  tablet by mouth daily.    . ramipril (ALTACE) 10 MG capsule Take 1 capsule (10 mg total) by mouth daily. 90 capsule 3   No current facility-administered medications for this encounter.     REVIEW OF SYSTEMS: On review of systems, the patient reports that she is doing well overall. No specific breast complaints are verbalized.    PHYSICAL EXAM:  Wt Readings from Last 3 Encounters:  09/24/19 197 lb 11.2 oz (89.7 kg)  07/17/19 202 lb 3.2 oz (91.7 kg)  07/10/19 204 lb 4.8 oz (92.7 kg)   Temp Readings from Last 3 Encounters:  09/24/19 97.8 F (36.6 C)  07/17/19 (!) 97.3 F (36.3 C)  07/10/19 98.2 F (36.8 C) (Temporal)   BP Readings from Last 3 Encounters:  09/24/19 124/73  07/17/19 122/72  07/10/19 120/60   Pulse Readings from Last 3 Encounters:  09/24/19 84  07/17/19 83  07/10/19 84    In general this is a well appearing caucasian female in no acute distress. She's alert and oriented x4 and appropriate throughout the examination. Cardiopulmonary assessment is negative for acute distress and she exhibits normal effort. Bilateral breast exam is deferred.    ECOG = 0  0 - Asymptomatic (Fully active, able to carry on all predisease activities without restriction)  1 - Symptomatic but completely ambulatory (Restricted in physically strenuous activity but ambulatory and able to carry out work of a light or sedentary nature. For example, light housework, office work)  2 - Symptomatic, <50% in bed during the day (Ambulatory and capable of all self care but unable to carry out any work activities. Up and about more than 50% of waking hours)  3 - Symptomatic, >50% in bed, but not bedbound (Capable of only limited self-care, confined to bed or chair 50% or more of waking hours)  4 - Bedbound (Completely disabled. Cannot carry on any self-care. Totally confined to bed or chair)  5 - Death   Eustace Pen MM, Creech RH, Tormey DC, et al. [phone]). "Toxicity and response criteria of the  [facility]". [place] Oncol. 5 (6): 649-55    LABORATORY DATA:  Lab Results  Component Value Date   WBC 7.5 09/24/2019   HGB 15.0 09/24/2019   HCT 47.3 (H) 09/24/2019   MCV 91.3 09/24/2019   PLT 323 09/24/2019   Lab Results  Component Value Date   NA 140 09/24/2019   K 3.7 09/24/2019   CL 105 09/24/2019   CO2 25 09/24/2019  Lab Results  Component Value Date   ALT 15 09/24/2019   AST 13 (L) 09/24/2019   ALKPHOS 81 09/24/2019   BILITOT 0.5 09/24/2019      RADIOGRAPHY: US BREAST LTD UNI RIGHT INC AXILLA  Result Date: 09/05/2019 CLINICAL DATA:  Recall from screening mammography with tomosynthesis, possible developing asymmetry involving the retroareolar RIGHT breast at far POSTERIOR depth, visible only on the MLO images. EXAM: DIGITAL DIAGNOSTIC RIGHT MAMMOGRAM WITH CAD AND TOMO ULTRASOUND RIGHT BREAST COMPARISON:  Previous exam(s). ACR Breast Density Category b: There are scattered areas of fibroglandular density. FINDINGS: Tomosynthesis and synthesized spot compression MLO view of the area of concern in the RIGHT breast and a tomosynthesis and synthesized mediolateral view of the RIGHT breast were obtained. The asymmetry in the retroareolar location at posterior depth persists though partially disperses on the spot compression images. The asymmetry is immediately adjacent to a likely normal intramammary lymph node which has been present multiple prior examinations. No suspicious findings elsewhere in the RIGHT breast on the full field mediolateral image. A circumscribed isodense mass in the UPPER breast at anterior depth has been stable over multiple prior mammograms. The full field mediolateral image was processed with CAD. Targeted RIGHT breast ultrasound is performed, showing a heterogeneous though predominantly hypoechoic mass with irregular margins at the 9 o'clock position approximately 5 cm from the nipple measuring approximately 9 x 6 x 6 mm,  demonstrating mixed posterior characteristics and demonstrating no internal power Doppler flow, corresponding to the screening mammographic developing asymmetry (as it is located anterior to a normal-appearing 8 mm intramammary lymph node at the 9 o'clock position 5 cm from the nipple). IMPRESSION: Indeterminate 9 mm mass involving the OUTER RIGHT breast at the 9 o'clock position approximately 5 cm from the nipple at POSTERIOR depth, located anterior to a normal appearing intramammary lymph node. RECOMMENDATION: Ultrasound-guided core needle biopsy of the indeterminate RIGHT breast mass. The ultrasound core needle biopsy procedure was discussed with patient and her questions were answered. She wishes to proceed with the biopsy which has been scheduled at her convenience. I have discussed the findings and recommendations with the patient. BI-RADS CATEGORY  4: Suspicious. Electronically Signed   By: Evangeline Dakin M.D.   On: 09/05/2019 11:33   MM DIAG BREAST TOMO UNI RIGHT  Result Date: 09/05/2019 CLINICAL DATA:  Recall from screening mammography with tomosynthesis, possible developing asymmetry involving the retroareolar RIGHT breast at far POSTERIOR depth, visible only on the MLO images. EXAM: DIGITAL DIAGNOSTIC RIGHT MAMMOGRAM WITH CAD AND TOMO ULTRASOUND RIGHT BREAST COMPARISON:  Previous exam(s). ACR Breast Density Category b: There are scattered areas of fibroglandular density. FINDINGS: Tomosynthesis and synthesized spot compression MLO view of the area of concern in the RIGHT breast and a tomosynthesis and synthesized mediolateral view of the RIGHT breast were obtained. The asymmetry in the retroareolar location at posterior depth persists though partially disperses on the spot compression images. The asymmetry is immediately adjacent to a likely normal intramammary lymph node which has been present multiple prior examinations. No suspicious findings elsewhere in the RIGHT breast on the full field  mediolateral image. A circumscribed isodense mass in the UPPER breast at anterior depth has been stable over multiple prior mammograms. The full field mediolateral image was processed with CAD. Targeted RIGHT breast ultrasound is performed, showing a heterogeneous though predominantly hypoechoic mass with irregular margins at the 9 o'clock position approximately 5 cm from the nipple measuring approximately 9 x 6 x 6 mm, demonstrating mixed posterior  characteristics and demonstrating no internal power Doppler flow, corresponding to the screening mammographic developing asymmetry (as it is located anterior to a normal-appearing 8 mm intramammary lymph node at the 9 o'clock position 5 cm from the nipple). IMPRESSION: Indeterminate 9 mm mass involving the OUTER RIGHT breast at the 9 o'clock position approximately 5 cm from the nipple at POSTERIOR depth, located anterior to a normal appearing intramammary lymph node. RECOMMENDATION: Ultrasound-guided core needle biopsy of the indeterminate RIGHT breast mass. The ultrasound core needle biopsy procedure was discussed with patient and her questions were answered. She wishes to proceed with the biopsy which has been scheduled at her convenience. I have discussed the findings and recommendations with the patient. BI-RADS CATEGORY  4: Suspicious. Electronically Signed   By: Evangeline Dakin M.D.   On: 09/05/2019 11:33   MM CLIP PLACEMENT RIGHT  Result Date: 09/12/2019 CLINICAL DATA:  Status post ultrasound-guided core biopsies of the right breast. EXAM: 3D DIAGNOSTIC RIGHT MAMMOGRAM POST ULTRASOUND BIOPSIES COMPARISON:  Previous exam(s). FINDINGS: Mammographic images were obtained following ultrasound guided biopsies of the right breast. There is a ribbon shaped clip in the 9 o'clock region of the right breast (posterior) and a coil shaped clip in the 9 o'clock region of the right breast (anterior). The clips are located 3.2 cm apart. IMPRESSION: Appropriate positioning of  the ribbon and coil shaped clips in the lateral aspect of the right breast. Final Assessment: Post Procedure Mammograms for Marker Placement Electronically Signed   By: Lillia Mountain M.D.   On: 09/12/2019 11:11   [place] RT BREAST BX W LOC DEV 1ST LESION IMG BX SPEC US GUIDE  Addendum Date: 09/22/2019   ADDENDUM REPORT: 09/16/2019 13:49 : ADDENDUM: Pathology revealed INVASIVE DUCTAL CARCINOMA WITH PAPILLARY FEATURES of the RIGHT breast, 9 o'clock posterior. This was found to be concordant by Dr. Lillia Mountain. Pathology revealed INVASIVE DUCTAL CARCINOMA WITH PAPILLARY FEATURES of the RIGHT breast, 9 o'clock anterior. This was found to be concordant by Dr. Lillia Mountain. Pathology results were discussed with the patient by telephone. The patient reported doing well after the biopsies with tenderness at the sites. Post biopsy instructions and care were reviewed and questions were answered. The patient was encouraged to call [facility] for any additional concerns. The patient was referred to [facility] on September 24, 2019. Pathology results reported by Stacie Acres RN on 09/16/2019. Electronically Signed   By: Lillia Mountain M.D.   On: 09/16/2019 13:49   Result Date: 09/22/2019 CLINICAL DATA:  Suspicious mass in the 9 o'clock region of the right breast and intramammary lymph node in the 9 o'clock region of the right breast. Ultrasound-guided core biopsies performed. EXAM: ULTRASOUND GUIDED right BREAST CORE NEEDLE BIOPSIES COMPARISON:  Previous exam(s). PROCEDURE: I met with the patient and we discussed the procedure of ultrasound-guided biopsy, including benefits and alternatives. We discussed the high likelihood of a successful procedure. We discussed the risks of the procedure, including infection, bleeding, tissue injury, clip migration, and inadequate sampling. Informed written consent was given. The usual time-out  protocol was performed immediately prior to the procedure. Lesion quadrant: 9 o'clock 5 cm from the nipple (posterior) Using sterile technique and 1% Lidocaine as local anesthetic, under direct ultrasound visualization, a 14 gauge spring-loaded device was used to perform biopsy of a mass in the 9 o'clock region of the right breast 5 cm from the nipple (posterior) using a lateral  to medial approach. Prior to the biopsy a ribbon shaped tissue marker clip was deployed at the posterior margin of the mass. Follow up 2 view mammogram was performed and dictated separately. I met with the patient and we discussed the procedure of ultrasound-guided biopsy, including benefits and alternatives. We discussed the high likelihood of a successful procedure. We discussed the risks of the procedure, including infection, bleeding, tissue injury, clip migration, and inadequate sampling. Informed written consent was given. The usual time-out protocol was performed immediately prior to the procedure. Lesion quadrant: 9 o'clock 5 cm from the nipple (anterior) Using sterile technique and 1% Lidocaine as local anesthetic, under direct ultrasound visualization, a 14 gauge spring-loaded device was used to perform biopsy of a mass in the 9 o'clock region of the right breast 5 cm from the nipple (anterior) using a lateral to medial approach. Prior to the biopsy a coil shaped tissue marker clip was deployed at the posterior margin of the mass. Follow up 2 view mammogram was performed and dictated separately. IMPRESSION: Ultrasound guided biopsies of the right breast. No apparent complications. Electronically Signed: By: Lillia Mountain M.D. On: 09/12/2019 11:04   [place] RT BREAST BX W LOC DEV EA ADD LESION IMG BX SPEC US GUIDE  Addendum Date: 09/22/2019   ADDENDUM REPORT: 09/16/2019 13:49 : ADDENDUM: Pathology revealed INVASIVE DUCTAL CARCINOMA WITH PAPILLARY FEATURES of the RIGHT breast, 9 o'clock posterior. This was found to be concordant by Dr.  Lillia Mountain. Pathology revealed INVASIVE DUCTAL CARCINOMA WITH PAPILLARY FEATURES of the RIGHT breast, 9 o'clock anterior. This was found to be concordant by Dr. Lillia Mountain. Pathology results were discussed with the patient by telephone. The patient reported doing well after the biopsies with tenderness at the sites. Post biopsy instructions and care were reviewed and questions were answered. The patient was encouraged to call [facility] for any additional concerns. The patient was referred to [facility] at [facility] on September 24, 2019. Pathology results reported by Stacie Acres RN on 09/16/2019. Electronically Signed   By: Lillia Mountain M.D.   On: 09/16/2019 13:49   Result Date: 09/22/2019 CLINICAL DATA:  Suspicious mass in the 9 o'clock region of the right breast and intramammary lymph node in the 9 o'clock region of the right breast. Ultrasound-guided core biopsies performed. EXAM: ULTRASOUND GUIDED right BREAST CORE NEEDLE BIOPSIES COMPARISON:  Previous exam(s). PROCEDURE: I met with the patient and we discussed the procedure of ultrasound-guided biopsy, including benefits and alternatives. We discussed the high likelihood of a successful procedure. We discussed the risks of the procedure, including infection, bleeding, tissue injury, clip migration, and inadequate sampling. Informed written consent was given. The usual time-out protocol was performed immediately prior to the procedure. Lesion quadrant: 9 o'clock 5 cm from the nipple (posterior) Using sterile technique and 1% Lidocaine as local anesthetic, under direct ultrasound visualization, a 14 gauge spring-loaded device was used to perform biopsy of a mass in the 9 o'clock region of the right breast 5 cm from the nipple (posterior) using a lateral to medial approach. Prior to the biopsy a ribbon shaped tissue marker clip was deployed at the posterior margin of the  mass. Follow up 2 view mammogram was performed and dictated separately. I met with the patient and we discussed the procedure of ultrasound-guided biopsy, including benefits and alternatives. We discussed the high likelihood of a successful procedure. We discussed the risks of the procedure, including  infection, bleeding, tissue injury, clip migration, and inadequate sampling. Informed written consent was given. The usual time-out protocol was performed immediately prior to the procedure. Lesion quadrant: 9 o'clock 5 cm from the nipple (anterior) Using sterile technique and 1% Lidocaine as local anesthetic, under direct ultrasound visualization, a 14 gauge spring-loaded device was used to perform biopsy of a mass in the 9 o'clock region of the right breast 5 cm from the nipple (anterior) using a lateral to medial approach. Prior to the biopsy a coil shaped tissue marker clip was deployed at the posterior margin of the mass. Follow up 2 view mammogram was performed and dictated separately. IMPRESSION: Ultrasound guided biopsies of the right breast. No apparent complications. Electronically Signed: By: Lillia Mountain M.D. On: 09/12/2019 11:04       IMPRESSION/PLAN: 1. 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 Dr. Lisbeth Renshaw discusses the pathology findings and reviews the nature of early stage right breast disease. The consensus from the breast conference includes breast conservation with lumpectomy with sentinel node biopsy. Depending on the size of the final tumor measurements rendered by pathology, the tumor may be tested for Oncotype Dx score to determine a role for systemic therapy. Provided that chemotherapy is not indicated, the patient's course would then be followed by external radiotherapy to the breast followed by antiestrogen therapy. We discussed the risks, benefits, short, and long term effects of radiotherapy, and the patient is interested in proceeding. Dr.  Lisbeth Renshaw discusses the delivery and logistics of radiotherapy and anticipates a course of 4 or 6 1/2 weeks of radiotherapy, 4 weeks is favored based on current work up. We will see her back a few weeks after surgery to discuss the simulation process and anticipate we starting radiotherapy about 4-6 weeks after surgery.     In a visit lasting 60 minutes, greater than 50% of the time was spent face to face reviewing her case, as well as in preparation of, discussing, and coordinating the patient's care.  The above documentation reflects my direct findings during this shared patient visit. Please see the separate note by Dr. Lisbeth Renshaw on this date for the remainder of the patient's plan of care.    Carola Rhine, PAC

## 2019-09-26 ENCOUNTER — Encounter: Payer: Self-pay | Admitting: *Deleted

## 2019-10-01 ENCOUNTER — Other Ambulatory Visit: Payer: Self-pay | Admitting: General Surgery

## 2019-10-01 DIAGNOSIS — C50411 Malignant neoplasm of upper-outer quadrant of right female breast: Secondary | ICD-10-CM

## 2019-10-01 DIAGNOSIS — Z170 Estrogen receptor positive status [ER+]: Secondary | ICD-10-CM

## 2019-10-01 NOTE — Progress Notes (Signed)
Nutrition  Patient identified by attending Breast Clinic on 09/24/2019. Patient was given nutrition packet by nurse navigator with RD contact information.   69 year old female with new diagnosis of breast cancer.  Planning lumpectomy, oncotype, radiation and antiestrogens.    Ht: 60 inches Wt: 197 lb 11.2 oz BMI: 38  Patient currently not at nutritional risk.  Please consult RD if changes in nutritional status occur.   Kenneith Stief B. Zenia Resides, [place], [place] Registered Dietitian [phone] (mobile)

## 2019-10-02 ENCOUNTER — Encounter: Payer: Self-pay | Admitting: *Deleted

## 2019-10-02 NOTE — Progress Notes (Signed)
Spoke to pt concerning [facility] from 9.1.21. Denies questions or concern regarding dx or treatment care plan. Encourage pt to call with needs. Received verbal understanding.

## 2019-10-06 ENCOUNTER — Encounter: Payer: Self-pay | Admitting: Genetic Counselor

## 2019-10-06 ENCOUNTER — Ambulatory Visit: Payer: Self-pay | Admitting: Genetic Counselor

## 2019-10-06 ENCOUNTER — Telehealth: Payer: Self-pay | Admitting: Genetic Counselor

## 2019-10-06 DIAGNOSIS — Z1379 Encounter for other screening for genetic and chromosomal anomalies: Secondary | ICD-10-CM

## 2019-10-06 HISTORY — DX: Encounter for other screening for genetic and chromosomal anomalies: Z13.79

## 2019-10-06 NOTE — Progress Notes (Signed)
HPI:  Michelle Sherman was previously seen in the [place] clinic due to a personal and family history of cancer and concerns regarding a hereditary predisposition to cancer. Please refer to our prior cancer genetics clinic note for more information regarding our discussion, assessment and recommendations, at the time. Michelle Sherman recent genetic test results were disclosed to her, as were recommendations warranted by these results. These results and recommendations are discussed in more detail below.  CANCER HISTORY:  Oncology History Overview Note  Cancer Staging Malignant neoplasm of upper-outer quadrant of right breast in female, estrogen receptor positive ([place]) Staging form: Breast, AJCC 8th Edition - Clinical stage from 09/12/2019: Stage IA (cT1b, cN0, cM0, G1, ER+, PR+, HER2-) - Signed by Truitt Merle, MD on 09/23/2019    Malignant neoplasm of upper-outer quadrant of right breast in female, estrogen receptor positive ([place])  09/05/2019 Mammogram   IMPRESSION: Indeterminate 9 x 6 x 6 mm mass involving the OUTER RIGHT breast at the 9 o'clock position approximately 5 cm from the nipple at POSTERIOR depth, located anterior to a normal appearing intramammary lymph Node.      09/12/2019 Cancer Staging   Staging form: Breast, AJCC 8th Edition - Clinical stage from 09/12/2019: Stage IA (cT1b, cN0, cM0, G1, ER+, PR+, HER2-) - Signed by Truitt Merle, MD on 09/23/2019   09/12/2019 Initial Biopsy   Diagnosis 1. Breast, right, needle core biopsy, 9 o'clock posterior - INVASIVE DUCTAL CARCINOMA WITH PAPILLARY FEATURES. 2. Breast, right, needle core biopsy, 9 o'clock anterior - INVASIVE DUCTAL CARCINOMA WITH PAPILLARY FEATURES. Microscopic Comment 1. - 2. The specimens share similar morphologic features. E-cadherin is positive and CK5/6 is negative. P63, Calponin and SMM-1 demonstrate the absence of myoepithelium. Ancillary studies will be reported separately. Results reported to [facility] on 09/15/2019. Intradepartmental consultation (Dr. Tresa Moore).   09/12/2019 Receptors her2   1. PROGNOSTIC INDICATORS Results: IMMUNOHISTOCHEMICAL AND MORPHOMETRIC ANALYSIS PERFORMED MANUALLY The tumor cells are NEGATIVE for Her2 (1+). Estrogen Receptor: 99%, POSITIVE, STRONG STAINING INTENSITY Progesterone Receptor: 99%, POSITIVE, STRONG STAINING INTENSITY Proliferation Marker Ki67: 5%   09/18/2019 Initial Diagnosis   Malignant neoplasm of upper-outer quadrant of right breast in female, estrogen receptor positive ([place])   10/05/2019 Genetic Testing   Negative genetic testing:  No pathogenic variants detected on the [facility] Breast Cancer STAT Panel + Common Hereditary Cancers Panel. 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 The report date is 10/05/2019.  The Breast Cancer STAT Panel offered by [facility] includes sequencing and deletion/duplication analysis for the following 9 genes:  ATM, BRCA1, BRCA2, CDH1, CHEK2, PALB2, PTEN, STK11 and TP53. The Common Hereditary Cancers Panel offered by [facility] includes sequencing and/or deletion duplication testing of the following 48 genes: APC, ATM, AXIN2, BARD1, BMPR1A, BRCA1, BRCA2, BRIP1, CDH1, CDK4, CDKN2A (p14ARF), CDKN2A (p16INK4a), CHEK2, CTNNA1, DICER1, EPCAM (Deletion/duplication testing only), GREM1 (promoter region deletion/duplication testing only), KIT, MEN1, MLH1, MSH2, MSH3, MSH6, MUTYH, NBN, NF1, NTHL1, PALB2, PDGFRA, PMS2, POLD1, POLE, PTEN, RAD50, RAD51C, RAD51D, RNF43, SDHB, SDHC, SDHD, SMAD4, SMARCA4. STK11, TP53, TSC1, TSC2, and VHL.  The following genes were evaluated for sequence changes only: SDHA and HOXB13 c.251G>A variant only.      FAMILY HISTORY:  We obtained a detailed, 4-generation family history.  Significant diagnoses are listed below: Family History  Problem Relation Age of Onset  . Ulcerative colitis Mother   . Diabetes Mother   . Heart disease Father   .  Lung  cancer Father 27       Lung  . Varicose Veins Sister   . Other Sister        "stiff heart"  . Diabetes Brother   . Pancreatic cancer Brother 87       Pancreatic  . Diabetes Maternal Aunt   . Diabetes Maternal Uncle   . Heart attack Paternal Grandfather   . Crohn's disease Brother   . Ulcerative colitis Brother   . Diabetes Brother   . Cancer Brother        unsure of type  . Lung cancer Paternal Aunt        smoker  . Cancer Paternal Aunt        unknown type    Michelle Sherman has one son Michelle Sherman, age 50) and one grandson (age 17). She has two brothers and one sister. Her oldest brother was diagnosed with pancreatic cancer initially at the age of 62. Her other brother was recently diagnosed with cancer, although she is unsure what type of cancer this may be.  Michelle Sherman's mother died at the age of 68 and did not have cancer. Michelle Sherman had one maternal aunt and one maternal uncle. Her maternal grandmother died in her 62s, and her maternal grandfather died at the age of 1 from a heart attack. There are no known diagnoses of cancer on the maternal side of the family.  Michelle Sherman's father died at the age of 71 from lung cancer diagnosed at age 51. She had two paternal uncles and five paternal aunts. One uncle died at the age of 50 in a hunting accident. She believes that one or two of her paternal aunts may have had cancer. One had lung cancer and was a smoker, and she is not sure what type of cancer the other aunt may have had. Her paternal grandmother died at the age of 47, and her paternal grandfather died at the age of 68 from a heart attack. There are no other known diagnoses of cancer on the paternal side of the family.  Michelle Sherman is unaware of previous family history of genetic testing for hereditary cancer risks. Patient's maternal ancestors are of [place] descent, and paternal ancestors are of [place] descent. Ancestry genetic testing also revealed [place]  ancestry. There is no reported Ashkenazi Jewish ancestry. There is no known consanguinity.  GENETIC TEST RESULTS: Genetic testing reported out on 10/05/2019 through the [facility] Breast Cancer STAT Panel + Common Hereditary Cancers Panel. No pathogenic variants were detected.   The Breast Cancer STAT Panel offered by [facility] includes sequencing and deletion/duplication analysis for the following 9 genes:  ATM, BRCA1, BRCA2, CDH1, CHEK2, PALB2, PTEN, STK11 and TP53. The Common Hereditary Cancers Panel offered by [facility] includes sequencing and/or deletion duplication testing of the following 48 genes: APC, ATM, AXIN2, BARD1, BMPR1A, BRCA1, BRCA2, BRIP1, CDH1, CDK4, CDKN2A (p14ARF), CDKN2A (p16INK4a), CHEK2, CTNNA1, DICER1, EPCAM (Deletion/duplication testing only), GREM1 (promoter region deletion/duplication testing only), KIT, MEN1, MLH1, MSH2, MSH3, MSH6, MUTYH, NBN, NF1, NTHL1, PALB2, PDGFRA, PMS2, POLD1, POLE, PTEN, RAD50, RAD51C, RAD51D, RNF43, SDHB, SDHC, SDHD, SMAD4, SMARCA4. STK11, TP53, TSC1, TSC2, and VHL.  The following genes were evaluated for sequence changes only: SDHA and HOXB13 c.251G>A variant only. The test report will be scanned into EPIC and located under the Molecular Pathology section of the Results Review tab.  A portion of the result report is included below for reference.     We discussed with Michelle Sherman that because  current genetic testing is not perfect, it is possible there may be a gene mutation in one of these genes that current testing cannot detect, but that chance is small.  We also discussed that there could be another gene that has not yet been discovered, or that we have not yet tested, that is responsible for the cancer diagnoses in the family. It is also possible there is a hereditary cause for the cancer in the family that Michelle Sherman did not inherit and therefore was not identified in her testing.  Therefore, it is important to remain in touch with cancer genetics in the  future so that we can continue to offer Michelle Sherman the most up to date genetic testing.   Genetic testing did identify a variant of uncertain significance (VUS) in the MLH1 gene called c.221A>T.  At this time, it is unknown if this variant is associated with increased cancer risk or if this is a normal finding, but most variants such as this get reclassified to being inconsequential. It should not be used to make medical management decisions. With time, we suspect the lab will determine the significance of this variant, if any. If we do learn more about it, we will try to contact Michelle Sherman to discuss it further. However, it is important to stay in touch with [place] periodically and keep the address and phone number up to date.  CANCER SCREENING RECOMMENDATIONS: Michelle Sherman test result is considered negative (normal).  This means that we have not identified a hereditary cause for her personal and family history of cancer at this time. Most cancers happen by chance and this negative test suggests that her personal and family history of cancer may fall into this category.    While reassuring, this does not definitively rule out a hereditary predisposition to cancer. It is still possible that there could be genetic mutations that are undetectable by current technology. There could be genetic mutations in genes that have not been tested or identified to increase cancer risk.  Therefore, it is recommended she continue to follow the cancer management and screening guidelines provided by heroncology and primary healthcare provider.   An individual's cancer risk and medical management are not determined by genetic test results alone. Overall cancer risk assessment incorporates additional factors, including personal medical history, family history, and any available genetic information that may result in a personalized plan for cancer prevention and surveillance.  RECOMMENDATIONS FOR FAMILY MEMBERS:  Individuals in  this family might be at some increased risk of developing cancer, over the general population risk, simply due to the family history of cancer.  We recommended women in this family have a yearly mammogram beginning at age 70, or 1 years younger than the earliest onset of cancer, an annual clinical breast exam, and perform monthly breast self-exams. Women in this family should also have a gynecological exam as recommended by their primary provider. All family members should be referred for colonoscopy starting at age 61.  It is also possible there is a hereditary cause for the cancer in Ms. Druck's family that she did not inherit and therefore was not identified in her.  Based on Michelle Sherman's family history, we recommended her brother, who was diagnosed with pancreatic cancer at age 88, have genetic counseling and testing. Michelle Sherman will let us know if we can be of any assistance in coordinating genetic counseling and/or testing for this family member.   FOLLOW-UP: Lastly, we discussed with Michelle Sherman that cancer  genetics is a rapidly advancing field and it is possible that new genetic tests will be appropriate for her and/or her family members in the future. We encouraged her to remain in contact with cancer genetics on an annual basis so we can update her personal and family histories and let her know of advances in cancer genetics that may benefit this family.   Our contact number was provided. Michelle Sherman questions were answered to her satisfaction, and she knows she is welcome to call us at anytime with additional questions or concerns.   Clint Guy, MS, [facility] Genetic Counselor [email or web address] Phone: [phone]

## 2019-10-06 NOTE — Telephone Encounter (Signed)
Revealed negative genetic testing. Discussed that we do not know why she has breast cancer or why there is cancer in the family. There could be a genetic mutation in the family that Michelle Sherman did not inherit. There could also be a mutation in a different gene that we are not testing, or our current technology may not be able detect certain mutations. It will therefore be important for her to stay in contact with genetics to keep up with whether additional testing may be appropriate in the future.   A variant of uncertain significance was detected in the MLH1 gene called c.221A>T. Her result is still considered normal at this time and should not impact her medical management.    

## 2019-10-07 NOTE — H&P (Signed)
Michelle Sherman Appointment: 09/24/2019 1:00 PM Location: [facility] Patient #: [id number] DOB: [birth year] Undefined / Language: Cleophus Molt / Race: White Female   History of Present Illness [name] MD; 09/24/2019 3:46 PM) The patient is a 69 year old female who presents with breast cancer. Pt is lovely 69 yo F who is referred by Dr. Miquel Dunn for a new diagnosis of right breast cancer. She had screening detected asymmetry. Diagnostic imaging showed a 8 mm intramammary lymph node at 9 o'clock and a 9 mm mass anterior to this. 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 the patient has no prior h/o cancer, but her brother had pancreatic cancer and her father had lung cancer.   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   dx mammogram/us 09/05/2019 DIGITAL DIAGNOSTIC RIGHT MAMMOGRAM WITH CAD AND TOMO  ULTRASOUND RIGHT BREAST  COMPARISON: Previous exam(s).  ACR Breast Density Category b: There are scattered areas of fibroglandular density.  FINDINGS: Tomosynthesis and synthesized spot compression MLO view of the area of concern in the RIGHT breast and a tomosynthesis and synthesized mediolateral view of the RIGHT breast were obtained.  The asymmetry in the retroareolar location at posterior depth persists though partially disperses on the spot compression images. The asymmetry is immediately adjacent to a likely normal intramammary lymph node which has been present multiple prior examinations.  No suspicious findings elsewhere in the RIGHT breast on the full field mediolateral image. A circumscribed isodense mass in the UPPER breast at anterior depth has been stable over multiple prior mammograms.  The full field mediolateral image was processed with CAD.  Targeted RIGHT breast ultrasound is performed, showing a heterogeneous though predominantly hypoechoic mass with irregular margins at the  9 o'clock position approximately 5 cm from the nipple measuring approximately 9 x 6 x 6 mm, demonstrating mixed posterior characteristics and demonstrating no internal power Doppler flow, corresponding to the screening mammographic developing asymmetry (as it is located anterior to a normal-appearing 8 mm intramammary lymph node at the 9 o'clock position 5 cm from the nipple).  IMPRESSION: Indeterminate 9 mm mass involving the OUTER RIGHT breast at the 9 o'clock position approximately 5 cm from the nipple at POSTERIOR depth, located anterior to a normal appearing intramammary lymph node.  RECOMMENDATION: Ultrasound-guided core needle biopsy of the indeterminate RIGHT breast mass.  The ultrasound core needle biopsy procedure was discussed with patient and her questions were answered. She wishes to proceed with the biopsy which has been scheduled at her convenience.  I have discussed the findings and recommendations with the patient.  BI-RADS CATEGORY 4: Suspicious.  pathology 09/12/2019 Diagnosis 1. Breast, right, needle core biopsy, 9 o'clock posterior - INVASIVE DUCTAL CARCINOMA WITH PAPILLARY FEATURES. 2. Breast, right, needle core biopsy, 9 o'clock anterior - INVASIVE DUCTAL CARCINOMA WITH PAPILLARY FEATURES.  The tumor cells are NEGATIVE for Her2 (1+). Estrogen Receptor: 99%, POSITIVE, STRONG STAINING INTENSITY Progesterone Receptor: 99%, POSITIVE, STRONG STAINING INTENSITY Proliferation Marker Ki67: 5%   09/24/2019 CBC, CMET near normal except for gluc 144 and Ca 10.7   Past Surgical History (April Staton, CMA; 09/24/2019 3:46 PM) Gallbladder Surgery - Laparoscopic  Tonsillectomy   Diagnostic Studies History (April Staton, CMA; 09/24/2019 3:46 PM) Colonoscopy  1-5 years ago Mammogram  within last year Pap Smear  >5 years ago  Medication History Conni Slipper, RN; 09/24/2019 7:56 AM) Medications Reconciled  Social History (April Staton, CMA; 09/24/2019 3:46  PM) Alcohol use  Occasional alcohol use. No drug use  Tobacco use  Never smoker.  Family History (April Staton, [place]; 09/24/2019 3:46 PM) Heart Disease  Father. Ischemic Bowel Disease  Brother. Malignant Neoplasm Of Pancreas  Brother. Respiratory Condition  Father. Thyroid problems  Mother.  Pregnancy / Birth History (April Staton, [place]; 09/24/2019 3:46 PM) Age at menarche  40 years. Age of menopause  37-50 Contraceptive History  Oral contraceptives. Gravida  1 Maternal age  104-25 Para  1 Regular periods   Other Problems (April Staton, [place]; 09/24/2019 3:46 PM) Arthritis  Cholelithiasis  Diabetes Mellitus  High blood pressure  Sleep Apnea  Thyroid Disease     Review of Systems (April Staton CMA; 09/24/2019 3:46 PM) General Not Present- Appetite Loss, Chills, Fatigue, Fever, Night Sweats, Weight Gain and Weight Loss. Skin Present- Rash. Not Present- Change in Wart/Mole, Dryness, Hives, Jaundice, New Lesions, Non-Healing Wounds and Ulcer. HEENT Present- Seasonal Allergies and Wears glasses/contact lenses. Not Present- Earache, Hearing Loss, Hoarseness, Nose Bleed, Oral Ulcers, Ringing in the Ears, Sinus Pain, Sore Throat, Visual Disturbances and Yellow Eyes. Respiratory Not Present- Bloody sputum, Chronic Cough, Difficulty Breathing, Snoring and Wheezing. Breast Not Present- Breast Mass, Breast Pain, Nipple Discharge and Skin Changes. Cardiovascular Present- Leg Cramps. Not Present- Chest Pain, Difficulty Breathing Lying Down, Palpitations, Rapid Heart Rate, Shortness of Breath and Swelling of Extremities. Gastrointestinal Not Present- Abdominal Pain, Bloating, Bloody Stool, Change in Bowel Habits, Chronic diarrhea, Constipation, Difficulty Swallowing, Excessive gas, Gets full quickly at meals, Hemorrhoids, Indigestion, Nausea, Rectal Pain and Vomiting. Female Genitourinary Present- Frequency. Not Present- Nocturia, Painful Urination, Pelvic Pain and  Urgency. Musculoskeletal Present- Joint Pain. Not Present- Back Pain, Joint Stiffness, Muscle Pain, Muscle Weakness and Swelling of Extremities. Neurological Present- Numbness. Not Present- Decreased Memory, Fainting, Headaches, Seizures, Tingling, Tremor, Trouble walking and Weakness. Psychiatric Present- Anxiety. Not Present- Bipolar, Change in Sleep Pattern, Depression, Fearful and Frequent crying. Endocrine Present- Hot flashes and New Diabetes. Not Present- Cold Intolerance, Excessive Hunger, Hair Changes and Heat Intolerance. Hematology Present- Blood Thinners. Not Present- Easy Bruising, Excessive bleeding, Gland problems, HIV and Persistent Infections.  Vitals [name] MD; 09/24/2019 3:30 PM) 09/24/2019 3:29 PM Weight: 197.7 lb Height: 60in Body Surface Area: 1.86 m Body Mass Index: 38.61 kg/m  Temp.: 97.65F  Pulse: 84 (Regular)  Resp.: 18 (Unlabored)  BP: 124/73(Sitting, Left Arm, Standard)       Physical Exam [name] MD; 09/24/2019 4:01 PM) General Mental Status-Alert. General Appearance-Consistent with stated age. Hydration-Well hydrated. Voice-Normal.  Head and Neck Head-normocephalic, atraumatic with no lesions or palpable masses. Trachea-midline. Thyroid Gland Characteristics - normal size and consistency.  Eye Eyeball - Bilateral-Extraocular movements intact. Sclera/Conjunctiva - Bilateral-No scleral icterus.  Chest and Lung Exam Chest and lung exam reveals -quiet, even and easy respiratory effort with no use of accessory muscles and on auscultation, normal breath sounds, no adventitious sounds and normal vocal resonance. Inspection Chest Wall - Normal. Back - normal.  Breast Note: relatively symmetric. no palpable masses. no skin dimpling. no nipple retraction, no LAD. faintly visible biopsy site far right lateral breast.   Cardiovascular Cardiovascular examination reveals -normal heart sounds, regular rate and  rhythm with no murmurs and normal pedal pulses bilaterally.  Abdomen Inspection Inspection of the abdomen reveals - No Hernias. Palpation/Percussion Palpation and Percussion of the abdomen reveal - Soft, Non Tender, No Rebound tenderness, No Rigidity (guarding) and No hepatosplenomegaly. Auscultation Auscultation of the abdomen reveals - Bowel sounds normal.  Neurologic Neurologic evaluation  reveals -alert and oriented x 3 with no impairment of recent or remote memory. Mental Status-Normal.  Musculoskeletal Global Assessment -Note: no gross deformities.  Normal Exam - Left-Upper Extremity Strength Normal and Lower Extremity Strength Normal. Normal Exam - Right-Upper Extremity Strength Normal and Lower Extremity Strength Normal.  Lymphatic Head & Neck  General Head & Neck Lymphatics: Bilateral - Description - Normal. Axillary  General Axillary Region: Bilateral - Description - Normal. Tenderness - Non Tender. Femoral & Inguinal  Generalized Femoral & Inguinal Lymphatics: Bilateral - Description - No Generalized lymphadenopathy.    Assessment & Plan [name] MD; 09/24/2019 4:05 PM) MALIGNANT NEOPLASM OF UPPER-OUTER QUADRANT OF RIGHT BREAST IN FEMALE, ESTROGEN RECEPTOR POSITIVE (C50.411) Impression: Pt has a cT1bN0 right breast cancer. Will plan right seed localized lumpectomy wtih sentinel lymph node biopsy. she might end up needing a second seed given the two areas, but one might be enough if it is between the two.  Surgery will be followed by possible oncotype depending on final tumor size. She will then have radiation recommended and antihormonal therapy.  The surgical procedure was described to the patient. I discussed the incision type and location and that we would need radiology involved on with a wire or seed marker and/or sentinel node.  The risks and benefits of the procedure were described to the patient and she wishes to proceed.  We discussed the  risks bleeding, infection, damage to other structures, need for further procedures/surgeries. We discussed the risk of seroma. The patient was advised if the area in the breast in cancer, we may need to go back to surgery for additional tissue to obtain negative margins or for a lymph node biopsy. The patient was advised that these are the most common complications, but that others can occur as well. They were advised against taking aspirin or other anti-inflammatory agents/blood thinners the week before surgery.  45 min spent in evaluation, examination, counseling, and coordination of care. >50% spent in counseling. Current Plans You are being scheduled for surgery- Our schedulers will call you.  You should hear from our office's scheduling department within 5 working days about the location, date, and time of surgery. We try to make accommodations for patient's preferences in scheduling surgery, but sometimes the OR schedule or the surgeon's schedule prevents [place] from making those accommodations.  If you have not heard from our office [phone]) in 5 working days, call the office and ask for your surgeon's nurse.  If you have other questions about your diagnosis, plan, or surgery, call the office and ask for your surgeon's nurse.

## 2019-10-08 ENCOUNTER — Other Ambulatory Visit: Payer: Self-pay

## 2019-10-08 ENCOUNTER — Encounter (HOSPITAL_BASED_OUTPATIENT_CLINIC_OR_DEPARTMENT_OTHER): Payer: Self-pay | Admitting: General Surgery

## 2019-10-10 ENCOUNTER — Other Ambulatory Visit (HOSPITAL_COMMUNITY)
Admission: RE | Admit: 2019-10-10 | Discharge: 2019-10-10 | Disposition: A | Discharge status: Home / Self Care | Payer: Medicare Other | Source: Ambulatory Visit | Attending: General Surgery | Admitting: General Surgery

## 2019-10-10 ENCOUNTER — Encounter (HOSPITAL_BASED_OUTPATIENT_CLINIC_OR_DEPARTMENT_OTHER)
Admission: RE | Admit: 2019-10-10 | Discharge: 2019-10-10 | Disposition: A | Discharge status: Home / Self Care | Payer: Medicare Other | Source: Ambulatory Visit | Attending: General Surgery | Admitting: General Surgery

## 2019-10-10 DIAGNOSIS — Z20822 Contact with and (suspected) exposure to covid-19: Secondary | ICD-10-CM | POA: Diagnosis not present

## 2019-10-10 DIAGNOSIS — Z01810 Encounter for preprocedural cardiovascular examination: Secondary | ICD-10-CM | POA: Insufficient documentation

## 2019-10-10 DIAGNOSIS — Z01812 Encounter for preprocedural laboratory examination: Secondary | ICD-10-CM | POA: Insufficient documentation

## 2019-10-10 LAB — BASIC METABOLIC PANEL
Anion gap: 12 (ref 5–15)
BUN: 13 mg/dL (ref 8–23)
CO2: 24 mmol/L (ref 22–32)
Calcium: 9.7 mg/dL (ref 8.9–10.3)
Chloride: 103 mmol/L (ref 98–111)
Creatinine, Ser: 0.65 mg/dL (ref 0.44–1.00)
GFR calc Af Amer: >60 mL/min (ref >60)
GFR calc non Af Amer: >60 mL/min (ref >60)
Glucose, Bld: 108 mg/dL — ABNORMAL HIGH (ref 70–99)
Potassium: 4.3 mmol/L (ref 3.5–5.1)
Sodium: 139 mmol/L (ref 135–145)

## 2019-10-10 MED ORDER — ENSURE PRE-SURGERY PO LIQD: 296.0000 mL | Freq: Once | ORAL | Status: DC

## 2019-10-10 NOTE — Progress Notes (Signed)

## 2019-10-11 LAB — SARS CORONAVIRUS 2 (TAT 6-24 HRS): SARS Coronavirus 2: NEGATIVE

## 2019-10-11 NOTE — Assessment & Plan Note (Signed)
Benefits from CPAP and seems to manage very well having settled on combination of CPAP and oral appliance. Plan- continue auto 5-15

## 2019-10-11 NOTE — Assessment & Plan Note (Signed)
Encouraged her to get serious about lifestyle change for weight loss.

## 2019-10-13 ENCOUNTER — Ambulatory Visit
Admission: RE | Admit: 2019-10-13 | Discharge: 2019-10-13 | Disposition: A | Discharge status: Home / Self Care | Payer: Medicare Other | Source: Ambulatory Visit | Attending: General Surgery | Admitting: General Surgery

## 2019-10-13 ENCOUNTER — Other Ambulatory Visit: Payer: Self-pay

## 2019-10-13 ENCOUNTER — Other Ambulatory Visit: Payer: Self-pay | Admitting: General Surgery

## 2019-10-13 DIAGNOSIS — N6310 Unspecified lump in the right breast, unspecified quadrant: Secondary | ICD-10-CM | POA: Diagnosis not present

## 2019-10-13 DIAGNOSIS — C50411 Malignant neoplasm of upper-outer quadrant of right female breast: Secondary | ICD-10-CM

## 2019-10-13 DIAGNOSIS — Z170 Estrogen receptor positive status [ER+]: Secondary | ICD-10-CM

## 2019-10-14 ENCOUNTER — Encounter (HOSPITAL_BASED_OUTPATIENT_CLINIC_OR_DEPARTMENT_OTHER)
Admission: RE | Disposition: A | Discharge status: Home / Self Care | Payer: Self-pay | Source: Home / Self Care | Attending: General Surgery

## 2019-10-14 ENCOUNTER — Encounter (HOSPITAL_BASED_OUTPATIENT_CLINIC_OR_DEPARTMENT_OTHER): Payer: Self-pay | Admitting: General Surgery

## 2019-10-14 ENCOUNTER — Encounter (HOSPITAL_COMMUNITY)
Admission: RE | Admit: 2019-10-14 | Discharge: 2019-10-14 | Disposition: A | Discharge status: Home / Self Care | Payer: Medicare Other | Source: Ambulatory Visit | Attending: General Surgery | Admitting: General Surgery

## 2019-10-14 ENCOUNTER — Other Ambulatory Visit: Payer: Self-pay

## 2019-10-14 ENCOUNTER — Ambulatory Visit (HOSPITAL_BASED_OUTPATIENT_CLINIC_OR_DEPARTMENT_OTHER): Payer: Medicare Other | Admitting: Anesthesiology

## 2019-10-14 ENCOUNTER — Ambulatory Visit (HOSPITAL_BASED_OUTPATIENT_CLINIC_OR_DEPARTMENT_OTHER)
Admission: RE | Admit: 2019-10-14 | Discharge: 2019-10-14 | Disposition: A | Discharge status: Home / Self Care | Payer: Medicare Other | Attending: General Surgery | Admitting: General Surgery

## 2019-10-14 ENCOUNTER — Ambulatory Visit
Admission: RE | Admit: 2019-10-14 | Discharge: 2019-10-14 | Disposition: A | Discharge status: Home / Self Care | Payer: Medicare Other | Source: Ambulatory Visit | Attending: General Surgery | Admitting: General Surgery

## 2019-10-14 DIAGNOSIS — C50911 Malignant neoplasm of unspecified site of right female breast: Secondary | ICD-10-CM | POA: Diagnosis not present

## 2019-10-14 DIAGNOSIS — I10 Essential (primary) hypertension: Secondary | ICD-10-CM | POA: Diagnosis not present

## 2019-10-14 DIAGNOSIS — Z8379 Family history of other diseases of the digestive system: Secondary | ICD-10-CM | POA: Diagnosis not present

## 2019-10-14 DIAGNOSIS — Z6838 Body mass index (BMI) 38.0-38.9, adult: Secondary | ICD-10-CM | POA: Insufficient documentation

## 2019-10-14 DIAGNOSIS — Z8249 Family history of ischemic heart disease and other diseases of the circulatory system: Secondary | ICD-10-CM | POA: Insufficient documentation

## 2019-10-14 DIAGNOSIS — M1990 Unspecified osteoarthritis, unspecified site: Secondary | ICD-10-CM | POA: Diagnosis not present

## 2019-10-14 DIAGNOSIS — Z170 Estrogen receptor positive status [ER+]: Secondary | ICD-10-CM

## 2019-10-14 DIAGNOSIS — G4730 Sleep apnea, unspecified: Secondary | ICD-10-CM | POA: Diagnosis not present

## 2019-10-14 DIAGNOSIS — C50411 Malignant neoplasm of upper-outer quadrant of right female breast: Secondary | ICD-10-CM | POA: Insufficient documentation

## 2019-10-14 DIAGNOSIS — K279 Peptic ulcer, site unspecified, unspecified as acute or chronic, without hemorrhage or perforation: Secondary | ICD-10-CM | POA: Insufficient documentation

## 2019-10-14 DIAGNOSIS — E039 Hypothyroidism, unspecified: Secondary | ICD-10-CM | POA: Insufficient documentation

## 2019-10-14 DIAGNOSIS — Z800 Family history of malignant neoplasm of digestive organs: Secondary | ICD-10-CM | POA: Insufficient documentation

## 2019-10-14 DIAGNOSIS — Z836 Family history of other diseases of the respiratory system: Secondary | ICD-10-CM | POA: Insufficient documentation

## 2019-10-14 DIAGNOSIS — N6310 Unspecified lump in the right breast, unspecified quadrant: Secondary | ICD-10-CM | POA: Diagnosis not present

## 2019-10-14 DIAGNOSIS — E119 Type 2 diabetes mellitus without complications: Secondary | ICD-10-CM | POA: Diagnosis not present

## 2019-10-14 DIAGNOSIS — Z8349 Family history of other endocrine, nutritional and metabolic diseases: Secondary | ICD-10-CM | POA: Diagnosis not present

## 2019-10-14 HISTORY — PX: BREAST LUMPECTOMY WITH RADIOACTIVE SEED AND SENTINEL LYMPH NODE BIOPSY: SHX6550

## 2019-10-14 HISTORY — DX: Other complications of anesthesia, initial encounter: T88.59XA

## 2019-10-14 HISTORY — DX: Sleep apnea, unspecified: G47.30

## 2019-10-14 HISTORY — DX: Family history of other specified conditions: Z84.89

## 2019-10-14 HISTORY — DX: Ulcerative colitis, unspecified, without complications: K51.90

## 2019-10-14 HISTORY — DX: Gastro-esophageal reflux disease without esophagitis: K21.9

## 2019-10-14 LAB — GLUCOSE, CAPILLARY
Glucose-Capillary: 158 mg/dL — ABNORMAL HIGH (ref 70–99)
Glucose-Capillary: 163 mg/dL — ABNORMAL HIGH (ref 70–99)

## 2019-10-14 SURGERY — BREAST LUMPECTOMY WITH RADIOACTIVE SEED AND SENTINEL LYMPH NODE BIOPSY
Anesthesia: General | Site: Breast | Laterality: Right

## 2019-10-14 MED ORDER — ONDANSETRON HCL 4 MG/2ML IJ SOLN
INTRAMUSCULAR | Status: AC
  Filled 2019-10-14: qty 2

## 2019-10-14 MED ORDER — LIDOCAINE HCL (CARDIAC) PF 100 MG/5ML IV SOSY
PREFILLED_SYRINGE | INTRAVENOUS | Status: DC | PRN
  Administered 2019-10-14: 50 mg via INTRAVENOUS

## 2019-10-14 MED ORDER — ACETAMINOPHEN 500 MG PO TABS
ORAL_TABLET | ORAL | Status: AC
  Filled 2019-10-14: qty 2

## 2019-10-14 MED ORDER — PROPOFOL 500 MG/50ML IV EMUL
INTRAVENOUS | Status: DC | PRN
  Administered 2019-10-14: 25 ug/kg/min via INTRAVENOUS

## 2019-10-14 MED ORDER — FENTANYL CITRATE (PF) 100 MCG/2ML IJ SOLN
INTRAMUSCULAR | Status: AC
  Filled 2019-10-14: qty 2

## 2019-10-14 MED ORDER — LIDOCAINE-EPINEPHRINE (PF) 1 %-1:200000 IJ SOLN
INTRAMUSCULAR | Status: DC | PRN
  Administered 2019-10-14: 40 mL via INTRAMUSCULAR

## 2019-10-14 MED ORDER — DIPHENHYDRAMINE HCL 50 MG/ML IJ SOLN
INTRAMUSCULAR | Status: DC | PRN
  Administered 2019-10-14: 12.5 mg via INTRAVENOUS

## 2019-10-14 MED ORDER — CEFAZOLIN SODIUM-DEXTROSE 2-4 GM/100ML-% IV SOLN
2.0000 g | INTRAVENOUS | Status: AC
  Administered 2019-10-14: 2 g via INTRAVENOUS

## 2019-10-14 MED ORDER — OXYCODONE HCL 5 MG/5ML PO SOLN: 5.0000 mg | Freq: Once | ORAL | Status: DC | PRN

## 2019-10-14 MED ORDER — MIDAZOLAM HCL 2 MG/2ML IJ SOLN
INTRAMUSCULAR | Status: AC
  Filled 2019-10-14: qty 2

## 2019-10-14 MED ORDER — DEXAMETHASONE SODIUM PHOSPHATE 4 MG/ML IJ SOLN
INTRAMUSCULAR | Status: DC | PRN
  Administered 2019-10-14: 10 mg via INTRAVENOUS

## 2019-10-14 MED ORDER — FENTANYL CITRATE (PF) 100 MCG/2ML IJ SOLN
100.0000 ug | Freq: Once | INTRAMUSCULAR | Status: AC
  Administered 2019-10-14: 100 ug via INTRAVENOUS

## 2019-10-14 MED ORDER — EPHEDRINE 5 MG/ML INJ
INTRAVENOUS | Status: AC
  Filled 2019-10-14: qty 10

## 2019-10-14 MED ORDER — SODIUM CHLORIDE (PF) 0.9 % IJ SOLN
INTRAVENOUS | Status: DC | PRN
  Administered 2019-10-14: 5 mL via INTRAMUSCULAR

## 2019-10-14 MED ORDER — ACETAMINOPHEN 160 MG/5ML PO SOLN: 325.0000 mg | ORAL | Status: DC | PRN

## 2019-10-14 MED ORDER — MEPERIDINE HCL 25 MG/ML IJ SOLN: 6.2500 mg | INTRAMUSCULAR | Status: DC | PRN

## 2019-10-14 MED ORDER — DIPHENHYDRAMINE HCL 50 MG/ML IJ SOLN
INTRAMUSCULAR | Status: AC
  Filled 2019-10-14: qty 1

## 2019-10-14 MED ORDER — ONDANSETRON HCL 4 MG/2ML IJ SOLN
4.0000 mg | Freq: Once | INTRAMUSCULAR | Status: AC | PRN
  Administered 2019-10-14: 4 mg via INTRAVENOUS

## 2019-10-14 MED ORDER — OXYCODONE HCL 5 MG PO TABS: 5.0000 mg | ORAL_TABLET | Freq: Once | ORAL | Status: DC | PRN

## 2019-10-14 MED ORDER — ACETAMINOPHEN 500 MG PO TABS
1000.0000 mg | ORAL_TABLET | ORAL | Status: AC
  Administered 2019-10-14: 1000 mg via ORAL

## 2019-10-14 MED ORDER — SUCCINYLCHOLINE CHLORIDE 200 MG/10ML IV SOSY
PREFILLED_SYRINGE | INTRAVENOUS | Status: AC
  Filled 2019-10-14: qty 10

## 2019-10-14 MED ORDER — DEXAMETHASONE SODIUM PHOSPHATE 10 MG/ML IJ SOLN
INTRAMUSCULAR | Status: AC
  Filled 2019-10-14: qty 1

## 2019-10-14 MED ORDER — LIDOCAINE 2% (20 MG/ML) 5 ML SYRINGE
INTRAMUSCULAR | Status: AC
  Filled 2019-10-14: qty 5

## 2019-10-14 MED ORDER — ROPIVACAINE HCL 7.5 MG/ML IJ SOLN
INTRAMUSCULAR | Status: DC | PRN
  Administered 2019-10-14: 30 mL

## 2019-10-14 MED ORDER — CEFAZOLIN SODIUM-DEXTROSE 2-4 GM/100ML-% IV SOLN
INTRAVENOUS | Status: AC
  Filled 2019-10-14: qty 100

## 2019-10-14 MED ORDER — LACTATED RINGERS IV SOLN: INTRAVENOUS | Status: DC

## 2019-10-14 MED ORDER — OXYCODONE HCL 5 MG PO TABS: 5.0000 mg | ORAL_TABLET | Freq: Four times a day (QID) | ORAL | 0 refills | Status: DC | PRN

## 2019-10-14 MED ORDER — ACETAMINOPHEN 325 MG PO TABS: 325.0000 mg | ORAL_TABLET | ORAL | Status: DC | PRN

## 2019-10-14 MED ORDER — TECHNETIUM TC 99M SULFUR COLLOID FILTERED
1.0000 | Freq: Once | INTRAVENOUS | Status: AC | PRN
  Administered 2019-10-14: 1 via INTRADERMAL

## 2019-10-14 MED ORDER — CHLORHEXIDINE GLUCONATE CLOTH 2 % EX PADS: 6.0000 | MEDICATED_PAD | Freq: Once | CUTANEOUS | Status: DC

## 2019-10-14 MED ORDER — PHENYLEPHRINE 40 MCG/ML (10ML) SYRINGE FOR IV PUSH (FOR BLOOD PRESSURE SUPPORT)
PREFILLED_SYRINGE | INTRAVENOUS | Status: AC
  Filled 2019-10-14: qty 10

## 2019-10-14 MED ORDER — MIDAZOLAM HCL 2 MG/2ML IJ SOLN
2.0000 mg | Freq: Once | INTRAMUSCULAR | Status: AC
  Administered 2019-10-14: 1 mg via INTRAVENOUS

## 2019-10-14 MED ORDER — FENTANYL CITRATE (PF) 100 MCG/2ML IJ SOLN
INTRAMUSCULAR | Status: DC | PRN
Start: 2019-10-14 — End: 2019-10-14
  Administered 2019-10-14: 50 ug via INTRAVENOUS
  Administered 2019-10-14: 25 ug via INTRAVENOUS

## 2019-10-14 MED ORDER — FENTANYL CITRATE (PF) 100 MCG/2ML IJ SOLN
25.0000 ug | INTRAMUSCULAR | Status: DC | PRN
  Administered 2019-10-14: 25 ug via INTRAVENOUS

## 2019-10-14 MED ORDER — PROPOFOL 10 MG/ML IV BOLUS
INTRAVENOUS | Status: DC | PRN
  Administered 2019-10-14: 150 mg via INTRAVENOUS

## 2019-10-14 SURGICAL SUPPLY — 48 items
ADH SKN CLS APL DERMABOND .7 (GAUZE/BANDAGES/DRESSINGS) ×1
APL PRP STRL LF DISP 70% ISPRP (MISCELLANEOUS) ×1
BINDER BREAST XXLRG (GAUZE/BANDAGES/DRESSINGS) ×2 IMPLANT
BLADE SURG 10 STRL SS (BLADE) ×2 IMPLANT
BNDG COHESIVE 4X5 TAN STRL (GAUZE/BANDAGES/DRESSINGS) ×2 IMPLANT
CANISTER SUCT 1200ML W/VALVE (MISCELLANEOUS) ×2 IMPLANT
CHLORAPREP W/TINT 26 (MISCELLANEOUS) ×2 IMPLANT
CLIP VESOCCLUDE LG 6/CT (CLIP) ×2 IMPLANT
CLIP VESOCCLUDE MED 6/CT (CLIP) ×4 IMPLANT
COVER MAYO STAND STRL (DRAPES) ×2 IMPLANT
COVER PROBE W GEL 5X96 (DRAPES) ×2 IMPLANT
DERMABOND ADVANCED (GAUZE/BANDAGES/DRESSINGS) ×1
DERMABOND ADVANCED .7 DNX12 (GAUZE/BANDAGES/DRESSINGS) ×1 IMPLANT
DRAPE UTILITY XL STRL (DRAPES) ×2 IMPLANT
DRSG PAD ABDOMINAL 8X10 ST (GAUZE/BANDAGES/DRESSINGS) ×2 IMPLANT
ELECT BLADE 4.0 EZ CLEAN MEGAD (MISCELLANEOUS) ×2
ELECT COATED BLADE 2.86 ST (ELECTRODE) ×2 IMPLANT
ELECT REM PT RETURN 9FT ADLT (ELECTROSURGICAL) ×2
ELECTRODE BLDE 4.0 EZ CLN MEGD (MISCELLANEOUS) ×1 IMPLANT
ELECTRODE REM PT RTRN 9FT ADLT (ELECTROSURGICAL) ×1 IMPLANT
GAUZE SPONGE 4X4 12PLY STRL LF (GAUZE/BANDAGES/DRESSINGS) ×2 IMPLANT
GLOVE BIO SURGEON STRL SZ 6 (GLOVE) ×4 IMPLANT
GLOVE BIOGEL PI IND STRL 6.5 (GLOVE) ×1 IMPLANT
GLOVE BIOGEL PI INDICATOR 6.5 (GLOVE) ×1
GOWN STRL REUS W/ TWL LRG LVL3 (GOWN DISPOSABLE) ×2 IMPLANT
GOWN STRL REUS W/TWL 2XL LVL3 (GOWN DISPOSABLE) ×2 IMPLANT
GOWN STRL REUS W/TWL LRG LVL3 (GOWN DISPOSABLE) ×4
KIT MARKER MARGIN INK (KITS) ×2 IMPLANT
LIGHT WAVEGUIDE WIDE FLAT (MISCELLANEOUS) ×2 IMPLANT
NDL SAFETY ECLIPSE 18X1.5 (NEEDLE) ×1 IMPLANT
NEEDLE HYPO 18GX1.5 SHARP (NEEDLE) ×2
NEEDLE HYPO 25X1 1.5 SAFETY (NEEDLE) ×4 IMPLANT
NS IRRIG 1000ML POUR BTL (IV SOLUTION) ×2 IMPLANT
PACK BASIN DAY SURGERY FS (CUSTOM PROCEDURE TRAY) ×2 IMPLANT
PACK UNIVERSAL I (CUSTOM PROCEDURE TRAY) ×2 IMPLANT
PENCIL SMOKE EVACUATOR (MISCELLANEOUS) ×2 IMPLANT
SLEEVE SCD COMPRESS KNEE MED (MISCELLANEOUS) ×2 IMPLANT
SPONGE LAP 18X18 RF (DISPOSABLE) ×4 IMPLANT
STOCKINETTE IMPERVIOUS LG (DRAPES) ×2 IMPLANT
STRIP CLOSURE SKIN 1/2X4 (GAUZE/BANDAGES/DRESSINGS) ×2 IMPLANT
SUT MNCRL AB 4-0 PS2 18 (SUTURE) ×2 IMPLANT
SUT VICRYL 3-0 CR8 SH (SUTURE) ×2 IMPLANT
SYR BULB EAR ULCER 3OZ GRN STR (SYRINGE) ×2 IMPLANT
SYR CONTROL 10ML LL (SYRINGE) ×4 IMPLANT
TOWEL GREEN STERILE FF (TOWEL DISPOSABLE) ×2 IMPLANT
TRAY FAXITRON CT DISP (TRAY / TRAY PROCEDURE) ×4 IMPLANT
TUBE CONNECTING 20X1/4 (TUBING) ×2 IMPLANT
YANKAUER SUCT BULB TIP NO VENT (SUCTIONS) ×2 IMPLANT

## 2019-10-14 NOTE — Progress Notes (Signed)
Nuclear med at bedside for injections.

## 2019-10-14 NOTE — Anesthesia Procedure Notes (Signed)
Procedure Name: LMA Insertion Date/Time: 10/14/2019 11:12 AM Performed by: Willa Frater, CRNA Pre-anesthesia Checklist: Patient identified, Emergency Drugs available, Suction available and Patient being monitored Patient Re-evaluated:Patient Re-evaluated prior to induction Oxygen Delivery Method: Circle system utilized Preoxygenation: Pre-oxygenation with 100% oxygen Induction Type: IV induction Ventilation: Mask ventilation without difficulty LMA: LMA inserted LMA Size: 4.0 Number of attempts: 1 Airway Equipment and Method: Bite block Placement Confirmation: positive ETCO2 Tube secured with: Tape Dental Injury: Teeth and Oropharynx as per pre-operative assessment

## 2019-10-14 NOTE — Interval H&P Note (Signed)
History and Physical Interval Note:  10/14/2019 10:18 AM  Michelle Sherman  has presented today for surgery, with the diagnosis of RIGHT BREAST CANCER.  The various methods of treatment have been discussed with the patient and family. After consideration of risks, benefits and other options for treatment, the patient has consented to  Procedure(s) with comments: RIGHT BREAST LUMPECTOMY WITH RADIOACTIVE SEED AND SENTINEL LYMPH NODE BIOPSY (Right) - COMBINED WITH REGIONAL FOR POST OP PAIN, RNFA as a surgical intervention.  The patient's history has been reviewed, patient examined, no change in status, stable for surgery.  I have reviewed the patient's chart and labs.  Questions were answered to the patient's satisfaction.     [name]

## 2019-10-14 NOTE — Transfer of Care (Signed)
Immediate Anesthesia Transfer of Care Note  Patient: Michelle Sherman  Procedure(s) Performed: RIGHT BREAST LUMPECTOMY WITH RADIOACTIVE SEED AND SENTINEL LYMPH NODE BIOPSY (Right Breast)  Patient Location: PACU  Anesthesia Type:General  Level of Consciousness: sedated  Airway & Oxygen Therapy: Patient Spontanous Breathing and Patient connected to face mask oxygen  Post-op Assessment: Report given to RN and Post -op Vital signs reviewed and stable  Post vital signs: Reviewed and stable  Last Vitals:  Vitals Value Taken Time  BP    Temp    Pulse 101 10/14/19 1305  Resp 19 10/14/19 1305  SpO2 99 % 10/14/19 1305  Vitals shown include unvalidated device data.  Last Pain:  Vitals:   10/14/19 0924  TempSrc: Oral  PainSc: 0-No pain      Patients Stated Pain Goal: 3 (47/58/30 7460)  Complications: No complications documented.

## 2019-10-14 NOTE — Discharge Instructions (Addendum)
[place] Office Phone Number [phone]  BREAST BIOPSY/ PARTIAL MASTECTOMY: POST OP INSTRUCTIONS  Always review your discharge instruction sheet given to you by the facility where your surgery was performed.  IF YOU HAVE DISABILITY OR FAMILY LEAVE FORMS, YOU MUST BRING THEM TO THE OFFICE FOR PROCESSING.  DO NOT GIVE THEM TO YOUR DOCTOR.  1. A prescription for pain medication may be given to you upon discharge.  Take your pain medication as prescribed, if needed.  If narcotic pain medicine is not needed, then you may take acetaminophen (Tylenol) or ibuprofen (Advil) as needed. 2. Take your usually prescribed medications unless otherwise directed 3. If you need a refill on your pain medication, please contact your pharmacy.  They will contact our office to request authorization.  Prescriptions will not be filled after 5pm or on week-ends. 4. You should eat very light the first 24 hours after surgery, such as soup, crackers, pudding, etc.  Resume your normal diet the day after surgery. 5. Most patients will experience some swelling and bruising in the breast.  Ice packs and a good support bra will help.  Swelling and bruising can take several days to resolve.  6. It is common to experience some constipation if taking pain medication after surgery.  Increasing fluid intake and taking a stool softener will usually help or prevent this problem from occurring.  A mild laxative (Milk of Magnesia or Miralax) should be taken according to package directions if there are no bowel movements after 48 hours. 7. Unless discharge instructions indicate otherwise, you may remove your bandages 48 hours after surgery, and you may shower at that time.  You may have steri-strips (small skin tapes) in place directly over the incision.  These strips should be left on the skin for 7-10 days.   Any sutures or staples will be removed at the office during your follow-up visit. 8. ACTIVITIES:  You may resume  regular daily activities (gradually increasing) beginning the next day.  Wearing a good support bra or sports bra (or the breast binder) minimizes pain and swelling.  You may have sexual intercourse when it is comfortable. a. You may drive when you no longer are taking prescription pain medication, you can comfortably wear a seatbelt, and you can safely maneuver your car and apply brakes. b. RETURN TO WORK:  __________1 week_______________ 9. You should see your doctor in the office for a follow-up appointment approximately two weeks after your surgery.  Your doctor's nurse will typically make your follow-up appointment when she calls you with your pathology report.  Expect your pathology report 2-3 business days after your surgery.  You may call to check if you do not hear from [place] after three days.   WHEN TO CALL YOUR DOCTOR: 1. Fever over 101.0 2. Nausea and/or vomiting. 3. Extreme swelling or bruising. 4. Continued bleeding from incision. 5. Increased pain, redness, or drainage from the incision.  The clinic staff is available to answer your questions during regular business hours.  Please don't hesitate to call and ask to speak to one of the nurses for clinical concerns.  If you have a medical emergency, go to the nearest emergency room or call 911.  A surgeon from [facility] is always on call at the hospital.  For further questions, please visit [email or web address]   May give Tylenol after 3:30PM, if needed.

## 2019-10-14 NOTE — Progress Notes (Signed)
Assisted Dr. Oddono with right, ultrasound guided, pectoralis block. Side rails up, monitors on throughout procedure. See vital signs in flow sheet. Tolerated Procedure well. 

## 2019-10-14 NOTE — Anesthesia Preprocedure Evaluation (Signed)
Anesthesia Evaluation  Patient identified by MRN, date of birth, ID band Patient awake    Reviewed: Allergy & Precautions, H&P , NPO status , Patient's Chart, lab work & pertinent test results, reviewed documented beta blocker date and time   History of Anesthesia Complications (+) Family history of anesthesia reaction and history of anesthetic complications  Airway Mallampati: II  TM Distance: >3 FB Neck ROM: full    Dental no notable dental hx.    Pulmonary sleep apnea and Continuous Positive Airway Pressure Ventilation ,    Pulmonary exam normal breath sounds clear to auscultation       Cardiovascular Exercise Tolerance: Good hypertension, Pt. on medications  Rhythm:regular Rate:Normal     Neuro/Psych  Neuromuscular disease negative psych ROS   GI/Hepatic Neg liver ROS, PUD, GERD  Medicated,  Endo/Other  diabetes, Type 2Hypothyroidism Morbid obesity  Renal/GU negative Renal ROS  negative genitourinary   Musculoskeletal  (+) Arthritis , Osteoarthritis,    Abdominal   Peds  Hematology negative hematology ROS (+)   Anesthesia Other Findings   Reproductive/Obstetrics negative OB ROS                             Anesthesia Physical Anesthesia Plan  ASA: III  Anesthesia Plan: General   Post-op Pain Management: GA combined w/ Regional for post-op pain   Induction:   PONV Risk Score and Plan: 3 and Ondansetron, Dexamethasone and Treatment may vary due to age or medical condition  Airway Management Planned: Oral ETT and LMA  Additional Equipment:   Intra-op Plan:   Post-operative Plan: Extubation in OR  Informed Consent: I have reviewed the patients History and Physical, chart, labs and discussed the procedure including the risks, benefits and alternatives for the proposed anesthesia with the patient or authorized representative who has indicated his/her understanding and  acceptance.     Dental Advisory Given  Plan Discussed with: CRNA  Anesthesia Plan Comments: (Discussed both nerve block for pain relief post-op and GA; including NV, sore throat, dental injury, and pulmonary complications)        Anesthesia Quick Evaluation

## 2019-10-14 NOTE — Anesthesia Procedure Notes (Signed)
Anesthesia Regional Block: Pectoralis block   Pre-Anesthetic Checklist: ,, timeout performed, Correct Patient, Correct Site, Correct Laterality, Correct Procedure, Correct Position, site marked, Risks and benefits discussed,  Surgical consent,  Pre-op evaluation,  At surgeon's request and post-op pain management  Laterality: Right  Prep: chloraprep       Needles:  Injection technique: Single-shot  Needle Type: Echogenic Stimulator Needle     Needle Length: 5cm  Needle Gauge: 22     Additional Needles:   Procedures:, nerve stimulator,,, ultrasound used (permanent image in chart),,,,  Narrative:  Start time: 10/14/2019 10:26 AM End time: 10/14/2019 10:30 AM Injection made incrementally with aspirations every 5 mL.  Performed by: Personally  Anesthesiologist: Duane Boston, MD  Additional Notes: Functioning IV was confirmed and monitors were applied.  A 51m 22ga Arrow echogenic stimulator needle was used. Sterile prep and drape,hand hygiene and sterile gloves were used. Ultrasound guidance: relevant anatomy identified, needle position confirmed, local anesthetic spread visualized around nerve(s)., vascular puncture avoided.  Image printed for medical record. Negative aspiration and negative test dose prior to incremental administration of local anesthetic. The patient tolerated the procedure well.

## 2019-10-14 NOTE — Op Note (Signed)
Right Breast Radioactive seed bracketed lumpectomy and sentinel lymph node mapping and biopsy  Indications: This patient presents with history of right breast cancer, 9:00 position, [id number], grade 1 invasive ductal, +/+/-  Pre-operative Diagnosis: right breast cancer  Post-operative Diagnosis: Same  Surgeon: [name]   Anesthesia: General endotracheal anesthesia  ASA Class: 3  Procedure Details  The patient was seen in the Holding Room. The risks, benefits, complications, treatment options, and expected outcomes were discussed with the patient. The possibilities of bleeding, infection, the need for additional procedures, failure to diagnose a condition, and creating a complication requiring transfusion or operation were discussed with the patient. The patient concurred with the proposed plan, giving informed consent.  The site of surgery properly noted/marked. The patient was taken to Operating Room # 8, identified, and the procedure verified as Right Breast seed bracketed Lumpectomy with sentinel lymph node biopsy. A Time Out was held and the above information confirmed.  Methylene blue was administered in the retroareolar location  The right arm, breast, and chest were prepped and draped in standard fashion. The lumpectomy was performed by creating an inframammary incision near the previously placed radioactive seeds.  Dissection was carried out around the points of maximum signal intensity. The cautery was used to perform the dissection.  Hemostasis was achieved with cautery.  The specimen was inked with the margin marker paint kit.    Specimen radiography confirmed inclusion of the mammographic lesion, the clip, and the seeds, however, several margins were close.  Additional margins were taken as described below.  The background signal in the breast was zero.  The edges of the cavity were marked with large clips, with one each medial, lateral, inferior and superior, and two clips  posteriorly.  The wound was irrigated and closed with 3-0 vicryl in layers and 4-0 monocryl subcuticular suture.    Using a hand-held gamma probe, right axillary sentinel nodes were identified transcutaneously.  An oblique incision was created below the axillary hairline.  Dissection was carried through the clavipectoral fascia.  Two deep level 2 axillary sentinel nodes were removed.  Counts per second were 89 and 51.    The background count was 3 cps.  The wound was irrigated.  Hemostasis was achieved with cautery.  The axillary incision was closed with a 3-0 vicryl deep dermal interrupted sutures and a 4-0 monocryl subcuticular closure.    Sterile dressings were applied. At the end of the operation, all sponge, instrument, and needle counts were correct.  Findings: grossly clear surgical margins and no adenopathy. Anterior margin is skin and posterior margin is pectoralis.    Estimated Blood Loss:  min         Specimens: Right breast lumpectomy with seeds.  Posterior margin, lateral margin, superior margin, medial margin and inferior margin.  Two  right axillary sentinel lymph nodes.             Complications:  None; patient tolerated the procedure well.         Disposition: PACU - hemodynamically stable.         Condition: stable

## 2019-10-15 ENCOUNTER — Encounter (HOSPITAL_BASED_OUTPATIENT_CLINIC_OR_DEPARTMENT_OTHER): Payer: Self-pay | Admitting: General Surgery

## 2019-10-15 NOTE — Anesthesia Postprocedure Evaluation (Signed)
Anesthesia Post Note  Patient: Michelle Sherman  Procedure(s) Performed: RIGHT BREAST LUMPECTOMY WITH RADIOACTIVE SEED AND SENTINEL LYMPH NODE BIOPSY (Right Breast)     Patient location during evaluation: PACU Anesthesia Type: General Level of consciousness: awake and alert Pain management: pain level controlled Vital Signs Assessment: post-procedure vital signs reviewed and stable Respiratory status: spontaneous breathing, nonlabored ventilation, respiratory function stable and patient connected to nasal cannula oxygen Cardiovascular status: blood pressure returned to baseline and stable Postop Assessment: no apparent nausea or vomiting Anesthetic complications: no   No complications documented.  Last Vitals:  Vitals:   10/14/19 1413 10/14/19 1430  BP: 118/67 123/69  Pulse: 66 79  Resp: 17 18  Temp:  36.5 C  SpO2: 92% 99%    Last Pain:  Vitals:   10/14/19 1430  TempSrc:   PainSc: 3    Pain Goal: Patients Stated Pain Goal: 3 (10/14/19 1400)                 Suha Schoenbeck

## 2019-10-20 ENCOUNTER — Telehealth: Payer: Self-pay | Admitting: General Surgery

## 2019-10-20 LAB — SURGICAL PATHOLOGY

## 2019-10-20 NOTE — Telephone Encounter (Signed)
Discussed pathology with patient.  There is one margin that appears to be positive, but the template appears a little unusual.  I will discuss with pathology in AM.

## 2019-10-21 ENCOUNTER — Other Ambulatory Visit: Payer: Self-pay | Admitting: General Surgery

## 2019-10-21 ENCOUNTER — Encounter: Payer: Self-pay | Admitting: *Deleted

## 2019-10-21 ENCOUNTER — Telehealth: Payer: Self-pay | Admitting: *Deleted

## 2019-10-21 NOTE — Telephone Encounter (Signed)
Received oncotype testing order per Dr. Burr Medico. Requisition faxed to pathology and [place].  Called pt and informed Oncotype Testing will be sent.

## 2019-10-24 HISTORY — PX: BREAST LUMPECTOMY: SHX2

## 2019-10-28 DIAGNOSIS — Z170 Estrogen receptor positive status [ER+]: Secondary | ICD-10-CM | POA: Diagnosis not present

## 2019-10-28 DIAGNOSIS — C50411 Malignant neoplasm of upper-outer quadrant of right female breast: Secondary | ICD-10-CM | POA: Diagnosis not present

## 2019-10-30 ENCOUNTER — Telehealth: Payer: Self-pay | Admitting: *Deleted

## 2019-10-30 ENCOUNTER — Encounter: Payer: Self-pay | Admitting: *Deleted

## 2019-10-30 NOTE — Telephone Encounter (Signed)
Received oncotype score of 0. Physician team notified. Called pt with results and discussed chemo not recommended. Received verbal understanding.

## 2019-11-04 ENCOUNTER — Encounter: Payer: Self-pay | Admitting: *Deleted

## 2019-11-04 ENCOUNTER — Encounter (HOSPITAL_COMMUNITY): Payer: Self-pay

## 2019-11-04 ENCOUNTER — Encounter (HOSPITAL_BASED_OUTPATIENT_CLINIC_OR_DEPARTMENT_OTHER): Payer: Self-pay | Admitting: General Surgery

## 2019-11-04 ENCOUNTER — Other Ambulatory Visit: Payer: Self-pay

## 2019-11-04 DIAGNOSIS — C50411 Malignant neoplasm of upper-outer quadrant of right female breast: Secondary | ICD-10-CM

## 2019-11-07 ENCOUNTER — Other Ambulatory Visit (HOSPITAL_COMMUNITY)
Admission: RE | Admit: 2019-11-07 | Discharge: 2019-11-07 | Disposition: A | Discharge status: Home / Self Care | Payer: Medicare Other | Source: Ambulatory Visit | Attending: General Surgery | Admitting: General Surgery

## 2019-11-07 ENCOUNTER — Encounter (HOSPITAL_BASED_OUTPATIENT_CLINIC_OR_DEPARTMENT_OTHER)
Admission: RE | Admit: 2019-11-07 | Discharge: 2019-11-07 | Disposition: A | Discharge status: Home / Self Care | Payer: Medicare Other | Source: Ambulatory Visit | Attending: General Surgery | Admitting: General Surgery

## 2019-11-07 DIAGNOSIS — Z20822 Contact with and (suspected) exposure to covid-19: Secondary | ICD-10-CM | POA: Diagnosis not present

## 2019-11-07 DIAGNOSIS — Z01812 Encounter for preprocedural laboratory examination: Secondary | ICD-10-CM | POA: Insufficient documentation

## 2019-11-07 LAB — BASIC METABOLIC PANEL
Anion gap: 9 (ref 5–15)
BUN: 14 mg/dL (ref 8–23)
CO2: 28 mmol/L (ref 22–32)
Calcium: 10.1 mg/dL (ref 8.9–10.3)
Chloride: 101 mmol/L (ref 98–111)
Creatinine, Ser: 0.68 mg/dL (ref 0.44–1.00)
GFR, Estimated: >60 mL/min (ref >60)
Glucose, Bld: 129 mg/dL — ABNORMAL HIGH (ref 70–99)
Potassium: 4.4 mmol/L (ref 3.5–5.1)
Sodium: 138 mmol/L (ref 135–145)

## 2019-11-07 LAB — SARS CORONAVIRUS 2 (TAT 6-24 HRS): SARS Coronavirus 2: NEGATIVE

## 2019-11-07 NOTE — Progress Notes (Signed)

## 2019-11-10 ENCOUNTER — Encounter: Payer: Self-pay | Admitting: Physical Therapy

## 2019-11-10 ENCOUNTER — Ambulatory Visit: Payer: Medicare Other | Attending: General Surgery | Admitting: Physical Therapy

## 2019-11-10 ENCOUNTER — Other Ambulatory Visit: Payer: Self-pay

## 2019-11-10 DIAGNOSIS — Z483 Aftercare following surgery for neoplasm: Secondary | ICD-10-CM | POA: Insufficient documentation

## 2019-11-10 DIAGNOSIS — C50411 Malignant neoplasm of upper-outer quadrant of right female breast: Secondary | ICD-10-CM | POA: Insufficient documentation

## 2019-11-10 DIAGNOSIS — R293 Abnormal posture: Secondary | ICD-10-CM | POA: Diagnosis not present

## 2019-11-10 DIAGNOSIS — Z170 Estrogen receptor positive status [ER+]: Secondary | ICD-10-CM | POA: Insufficient documentation

## 2019-11-10 NOTE — Therapy (Signed)
[place], [place], [address] Phone: [phone]   Fax:  [phone]  Physical Therapy Treatment  Patient Details  Name: Michelle Sherman MRN: [id number] Date of Birth: Sep 17, 1950 Referring Provider (PT): Dr. [name]   Encounter Date: 11/10/2019   PT End of Session - 11/10/19 1553    Visit Number 2    Number of Visits 2    PT Start Time 1509    PT Stop Time 1553    PT Time Calculation (min) 44 min    Activity Tolerance Patient tolerated treatment well    Behavior During Therapy [facility] for tasks assessed/performed           Past Medical History:  Diagnosis Date  . Allergy   . Arthritis   . Breast cancer ([place]) 08/2019   right breast IDC  . Complication of anesthesia    unable to void after surgery and had to stay overnight   . Diabetes mellitus without complication ([place])   . Family history of adverse reaction to anesthesia    sister, hard to wake up   . Family history of lung cancer   . Family history of pancreatic cancer   . GERD (gastroesophageal reflux disease)   . Hypertension   . Sleep apnea    wears CPAP nightly  . Thyroid disease   . Ulcerative colitis [facility])     Past Surgical History:  Procedure Laterality Date  . BREAST LUMPECTOMY WITH RADIOACTIVE SEED AND SENTINEL LYMPH NODE BIOPSY Right 10/14/2019   Procedure: RIGHT BREAST LUMPECTOMY WITH RADIOACTIVE SEED AND SENTINEL LYMPH NODE BIOPSY;  Surgeon: [name], MD;  Location: [facility];  Service: General;  Laterality: Right;  . CHOLECYSTECTOMY    . SKIN SURGERY  12/2015   Fatty tumor removed  . TONSILLECTOMY AND ADENOIDECTOMY  1956    There were no vitals filed for this visit.   Subjective Assessment - 11/10/19 1515    Subjective Patient underwent a right lumpectomy and sentinel node biopsy (2 negative nodes) on 10/14/2019. She is scheduled for a re-excision 11/11/2019. Her Oncotype score was zero so no  chemo needed. She is scheduled for 12/11/2019 for radiation simulation.    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 She had a right lumpectomy and sentinel node biopsy (2 negative nodes) on 10/14/2019. It is ER/PR positive and HER2 negative with a Ki67 of 5%. She cares for her husband who is in a motorized wheelchair and has bilateral BKA.    Patient Stated Goals See how my arm is doing    Currently in Pain? Yes    Pain Score 5     Pain Location Breast    Pain Orientation Right    Pain Descriptors / Indicators Sore    Pain Type Surgical pain    Pain Onset 1 to 4 weeks ago    Pain Frequency Intermittent    Aggravating Factors  Wearing bra    Pain Relieving Factors Not wearing bra              [facility] PT Assessment - 11/10/19 0001      Assessment   Medical Diagnosis s/p right lumpectomy and SLNB    Referring Provider (PT) Dr. [name]    Onset Date/Surgical Date 10/14/19    Hand Dominance Right    Prior Therapy Baselines      Precautions   Precautions Other (comment)  Precaution Comments recent surgery and right arm lymphedema risk      Restrictions   Weight Bearing Restrictions No      Balance Screen   Has the patient fallen in the past 6 months No    Has the patient had a decrease in activity level because of a fear of falling?  No    Is the patient reluctant to leave their home because of a fear of falling?  No      Home Ecologist residence    Living Arrangements Spouse/significant other    Available Help at Discharge Family      Prior Function   Level of Independence Independent    Vocation Unemployed    Leisure She is not exercising      Cognition   Overall Cognitive Status Within Functional Limits for tasks assessed      Observation/Other Assessments   Observations Both the right breast and axillary incision appear to be healing well with no sign of  infection.      Posture/Postural Control   Posture/Postural Control Postural limitations    Postural Limitations Forward head;Rounded Shoulders      ROM / Strength   AROM / PROM / Strength AROM      AROM   AROM Assessment Site Shoulder    Right/Left Shoulder Right    Right Shoulder Extension 50 Degrees    Right Shoulder Flexion 121 Degrees    Right Shoulder ABduction 123 Degrees   Painful;unrelated to breast surgery   Right Shoulder Internal Rotation 45 Degrees    Right Shoulder External Rotation 75 Degrees             LYMPHEDEMA/ONCOLOGY QUESTIONNAIRE - 11/10/19 0001      Type   Cancer Type Right breast cancer      Surgeries   Lumpectomy Date 10/14/19    Sentinel Lymph Node Biopsy Date 10/14/19    Number Lymph Nodes Removed 2      Treatment   Active Chemotherapy Treatment No    Past Chemotherapy Treatment No    Active Radiation Treatment No    Past Radiation Treatment No    Current Hormone Treatment No    Past Hormone Therapy No      What other symptoms do you have   Are you Having Heaviness or Tightness No    Are you having Pain Yes    Are you having pitting edema No    Is it Hard or Difficult finding clothes that fit No    Do you have infections No    Is there Decreased scar mobility No    [place]mer Sign No      Lymphedema Assessments   Lymphedema Assessments Upper extremities      Right Upper Extremity Lymphedema   10 cm Proximal to Olecranon Process 36 cm    Olecranon Process 28 cm    10 cm Proximal to Ulnar Styloid Process 25.9 cm    Just Proximal to Ulnar Styloid Process 16 cm    Across Hand at [employer] 18.9 cm    At [place] of 2nd Digit 5.9 cm      Left Upper Extremity Lymphedema   10 cm Proximal to Olecranon Process 34.4 cm    Olecranon Process 26.6 cm    10 cm Proximal to Ulnar Styloid Process 24.4 cm    Just Proximal to Ulnar Styloid Process 15.5 cm    Across Hand at [employer] 18.5  cm    At [facility] of 2nd Digit 5.7 cm               Katina Dung - 11/10/19 0001    Open a tight or new jar Mild difficulty    Do heavy household chores (wash walls, wash floors) Severe difficulty    Carry a shopping bag or briefcase No difficulty    Wash your back Mild difficulty    Use a knife to cut food No difficulty    Recreational activities in which you take some force or impact through your arm, shoulder, or hand (golf, hammering, tennis) Unable    During the past week, to what extent has your arm, shoulder or hand problem interfered with your normal social activities with family, friends, neighbors, or groups? Not at all    During the past week, to what extent has your arm, shoulder or hand problem limited your work or other regular daily activities Not at all    Arm, shoulder, or hand pain. Moderate    Tingling (pins and needles) in your arm, shoulder, or hand None    Difficulty Sleeping Mild difficulty    DASH Score 27.27 %                          PT Education - 11/10/19 1548    Education Details Lymphedema risk reduction and scar massage; follow up care    Person(s) Educated Patient    Methods Explanation;Demonstration;Handout    Comprehension Returned demonstration;Verbalized understanding               PT Long Term Goals - 11/10/19 1559      PT LONG TERM GOAL #1   Title Patient will demonstrate she has regained full shoulder ROM and function post operatively compared to baselines.    Time 8    Period Weeks    Status Achieved                 Plan - 11/10/19 1553    Clinical Impression Statement Patient appears to be doing very well s/p right lumpectomy and sentinel node biopsy (2 negative nodes) on 10/14/2019. She has regained shoulder ROM but continues to be limited by her pre-existing shoulder issue. She stated she does not want to address that right now as she has many things going on with other medical appointments and caring for her husband. She has no signs of lymphedema and  incisions appear to be well healed (see photo in media). She will undergo a re-excision tomorrow to obtain clear margins and was instructed in aftercare including wearing a compression bra to reduce swelling. She plasn to participate in the After Breast Cancer class on 11/24/2019 but otherwise has no PT needs at this time.    PT Treatment/Interventions ADLs/Self Care Home Management;Therapeutic exercise;Patient/family education    PT Next Visit Plan D/C    PT Home Exercise Plan Post op shoulder ROM HEP    Consulted and Agree with Plan of Care Patient           Patient will benefit from skilled therapeutic intervention in order to improve the following deficits and impairments:  Decreased knowledge of precautions, Pain, Postural dysfunction, Decreased range of motion, Impaired UE functional use  Visit Diagnosis: Malignant neoplasm of upper-outer quadrant of right breast in female, estrogen receptor positive (HCC)  Abnormal posture  Aftercare following surgery for neoplasm     Problem List Patient Active Problem List  Diagnosis Date Noted  . Genetic testing 10/06/2019  . Family history of pancreatic cancer   . Family history of lung cancer   . Malignant neoplasm of upper-outer quadrant of right breast in female, estrogen receptor positive ([place]) 09/18/2019  . Incomplete uterovaginal prolapse 04/03/2019  . Need for shingles vaccine 06/27/2018  . 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 Plantar fasciitis 05/06/2017  . Controlled type 2 diabetes mellitus without complication, without long-term current use of insulin ([place]) 11/01/2016  . Hypertension associated with diabetes ([place]) 11/01/2016  . Severe obesity (BMI 35.0-35.9 with comorbidity) ([place]) 11/01/2016  . Combined hyperlipidemia associated with type 2 diabetes mellitus ([place]) 01/15/2012  . Allergic rhinitis 10/05/2011  . Ulcerative colitis without complications ([place]) 29/51/8841  . Diverticulosis of colon 10/17/2010  . Internal  hemorrhoids 10/17/2010  . Acquired hypothyroidism 10/04/2010  . Osteoarthrosis of knee 10/04/2010  . OSA on CPAP 06/28/2010   PHYSICAL THERAPY DISCHARGE SUMMARY  Visits from Start of Care: 2  Current functional level related to goals / functional outcomes: Goal met. See above for objective findings.   Remaining deficits: Limited shoulder motion but due to pre-existing condition.   Education / Equipment: Lymphedema education and HEP Plan: Patient agrees to discharge.  Patient goals were met. Patient is being discharged due to meeting the stated rehab goals.  ?????         Annia Friendly, [place] 11/10/19 4:02 PM  [place], [place], [address] Phone: [phone]   Fax:  [phone]  Name: MARQUESA RATH MRN: [id number] Date of Birth: 24-Feb-1950

## 2019-11-10 NOTE — Patient Instructions (Signed)
            [facility]         [address], [place] [address]         [phone]         Annia Friendly, PT, CLT   After Breast Cancer Class It is recommended you attend the ABC class to be educated on lymphedema risk reduction. This class is free of charge and lasts for 1 hour. It is a 1-time class.  You are scheduled for November 1st at 11:00. We will send you a link.  Scar massage You can begin doing gentle scar massage with coconut oil on your armpit incision and then after your breast incision heals after your surgery tomorrow, you can do scar massage on that one.   Compression garment Continue wearing your sports bra until you have no swelling.   Home exercise Program Continue doing your home exercises until you feel no tightness in your shoulder.   Follow up PT: It is recommended you return every 3 months for the first 3 years following surgery to be assessed on the SOZO machine for an L-Dex score. This helps prevent clinically significant lymphedema in 95% of patients. These follow up screens are 15 minute appointments that you are not billed for. You are scheduled for 01/19/2020 at 4:00.

## 2019-11-11 ENCOUNTER — Other Ambulatory Visit: Payer: Self-pay

## 2019-11-11 ENCOUNTER — Encounter (HOSPITAL_BASED_OUTPATIENT_CLINIC_OR_DEPARTMENT_OTHER): Payer: Self-pay | Admitting: General Surgery

## 2019-11-11 ENCOUNTER — Ambulatory Visit (HOSPITAL_BASED_OUTPATIENT_CLINIC_OR_DEPARTMENT_OTHER)
Admission: RE | Admit: 2019-11-11 | Discharge: 2019-11-11 | Disposition: A | Discharge status: Home / Self Care | Payer: Medicare Other | Attending: General Surgery | Admitting: General Surgery

## 2019-11-11 ENCOUNTER — Ambulatory Visit (HOSPITAL_BASED_OUTPATIENT_CLINIC_OR_DEPARTMENT_OTHER): Payer: Medicare Other | Admitting: Anesthesiology

## 2019-11-11 ENCOUNTER — Encounter (HOSPITAL_BASED_OUTPATIENT_CLINIC_OR_DEPARTMENT_OTHER)
Admission: RE | Disposition: A | Discharge status: Home / Self Care | Payer: Self-pay | Source: Home / Self Care | Attending: General Surgery

## 2019-11-11 DIAGNOSIS — E119 Type 2 diabetes mellitus without complications: Secondary | ICD-10-CM | POA: Insufficient documentation

## 2019-11-11 DIAGNOSIS — Z7984 Long term (current) use of oral hypoglycemic drugs: Secondary | ICD-10-CM | POA: Insufficient documentation

## 2019-11-11 DIAGNOSIS — N6489 Other specified disorders of breast: Secondary | ICD-10-CM | POA: Insufficient documentation

## 2019-11-11 DIAGNOSIS — Z79890 Hormone replacement therapy (postmenopausal): Secondary | ICD-10-CM | POA: Insufficient documentation

## 2019-11-11 DIAGNOSIS — Z79899 Other long term (current) drug therapy: Secondary | ICD-10-CM | POA: Insufficient documentation

## 2019-11-11 DIAGNOSIS — Z7982 Long term (current) use of aspirin: Secondary | ICD-10-CM | POA: Diagnosis not present

## 2019-11-11 DIAGNOSIS — Z170 Estrogen receptor positive status [ER+]: Secondary | ICD-10-CM | POA: Diagnosis not present

## 2019-11-11 DIAGNOSIS — C50411 Malignant neoplasm of upper-outer quadrant of right female breast: Secondary | ICD-10-CM | POA: Diagnosis not present

## 2019-11-11 DIAGNOSIS — G4730 Sleep apnea, unspecified: Secondary | ICD-10-CM | POA: Diagnosis not present

## 2019-11-11 DIAGNOSIS — I10 Essential (primary) hypertension: Secondary | ICD-10-CM | POA: Insufficient documentation

## 2019-11-11 DIAGNOSIS — E039 Hypothyroidism, unspecified: Secondary | ICD-10-CM | POA: Insufficient documentation

## 2019-11-11 DIAGNOSIS — G709 Myoneural disorder, unspecified: Secondary | ICD-10-CM | POA: Diagnosis not present

## 2019-11-11 DIAGNOSIS — C50911 Malignant neoplasm of unspecified site of right female breast: Secondary | ICD-10-CM | POA: Diagnosis not present

## 2019-11-11 DIAGNOSIS — M1990 Unspecified osteoarthritis, unspecified site: Secondary | ICD-10-CM | POA: Insufficient documentation

## 2019-11-11 DIAGNOSIS — G4733 Obstructive sleep apnea (adult) (pediatric): Secondary | ICD-10-CM | POA: Diagnosis not present

## 2019-11-11 HISTORY — PX: RE-EXCISION OF BREAST LUMPECTOMY: SHX6048

## 2019-11-11 LAB — GLUCOSE, CAPILLARY: Glucose-Capillary: 125 mg/dL — ABNORMAL HIGH (ref 70–99)

## 2019-11-11 SURGERY — EXCISION, LESION, BREAST
Anesthesia: General | Site: Breast | Laterality: Right

## 2019-11-11 MED ORDER — LIDOCAINE 2% (20 MG/ML) 5 ML SYRINGE
INTRAMUSCULAR | Status: DC | PRN
  Administered 2019-11-11: 80 mg via INTRAVENOUS

## 2019-11-11 MED ORDER — ONDANSETRON HCL 4 MG/2ML IJ SOLN
INTRAMUSCULAR | Status: DC | PRN
  Administered 2019-11-11: 4 mg via INTRAVENOUS

## 2019-11-11 MED ORDER — ACETAMINOPHEN 500 MG PO TABS
1000.0000 mg | ORAL_TABLET | ORAL | Status: AC
  Administered 2019-11-11: 1000 mg via ORAL

## 2019-11-11 MED ORDER — CHLORHEXIDINE GLUCONATE CLOTH 2 % EX PADS: 6.0000 | MEDICATED_PAD | Freq: Once | CUTANEOUS | Status: DC

## 2019-11-11 MED ORDER — DEXAMETHASONE SODIUM PHOSPHATE 10 MG/ML IJ SOLN
INTRAMUSCULAR | Status: AC
  Filled 2019-11-11: qty 1

## 2019-11-11 MED ORDER — CEFAZOLIN SODIUM-DEXTROSE 2-4 GM/100ML-% IV SOLN
INTRAVENOUS | Status: AC
  Filled 2019-11-11: qty 100

## 2019-11-11 MED ORDER — LIDOCAINE 2% (20 MG/ML) 5 ML SYRINGE
INTRAMUSCULAR | Status: AC
  Filled 2019-11-11: qty 5

## 2019-11-11 MED ORDER — MIDAZOLAM HCL 2 MG/2ML IJ SOLN
INTRAMUSCULAR | Status: AC
  Filled 2019-11-11: qty 2

## 2019-11-11 MED ORDER — CEFAZOLIN SODIUM-DEXTROSE 2-4 GM/100ML-% IV SOLN
2.0000 g | INTRAVENOUS | Status: AC
  Administered 2019-11-11: 2 g via INTRAVENOUS

## 2019-11-11 MED ORDER — DEXAMETHASONE SODIUM PHOSPHATE 10 MG/ML IJ SOLN
INTRAMUSCULAR | Status: DC | PRN
  Administered 2019-11-11: 10 mg via INTRAVENOUS

## 2019-11-11 MED ORDER — FENTANYL CITRATE (PF) 100 MCG/2ML IJ SOLN: 25.0000 ug | INTRAMUSCULAR | Status: DC | PRN

## 2019-11-11 MED ORDER — DIPHENHYDRAMINE HCL 50 MG/ML IJ SOLN
INTRAMUSCULAR | Status: AC
  Filled 2019-11-11: qty 1

## 2019-11-11 MED ORDER — LIDOCAINE HCL 1 % IJ SOLN
INTRAMUSCULAR | Status: DC | PRN
  Administered 2019-11-11: 50 mL

## 2019-11-11 MED ORDER — ACETAMINOPHEN 500 MG PO TABS
ORAL_TABLET | ORAL | Status: AC
  Filled 2019-11-11: qty 2

## 2019-11-11 MED ORDER — OXYCODONE HCL 5 MG PO TABS: 5.0000 mg | ORAL_TABLET | Freq: Once | ORAL | Status: DC | PRN

## 2019-11-11 MED ORDER — OXYCODONE HCL 5 MG/5ML PO SOLN: 5.0000 mg | Freq: Once | ORAL | Status: DC | PRN

## 2019-11-11 MED ORDER — DIPHENHYDRAMINE HCL 50 MG/ML IJ SOLN
INTRAMUSCULAR | Status: DC | PRN
  Administered 2019-11-11: 25 mg via INTRAVENOUS

## 2019-11-11 MED ORDER — SUCCINYLCHOLINE CHLORIDE 200 MG/10ML IV SOSY
PREFILLED_SYRINGE | INTRAVENOUS | Status: AC
  Filled 2019-11-11: qty 10

## 2019-11-11 MED ORDER — FENTANYL CITRATE (PF) 100 MCG/2ML IJ SOLN
INTRAMUSCULAR | Status: AC
  Filled 2019-11-11: qty 2

## 2019-11-11 MED ORDER — AMISULPRIDE (ANTIEMETIC) 5 MG/2ML IV SOLN: 10.0000 mg | Freq: Once | INTRAVENOUS | Status: DC | PRN

## 2019-11-11 MED ORDER — MIDAZOLAM HCL 5 MG/5ML IJ SOLN
INTRAMUSCULAR | Status: DC | PRN
  Administered 2019-11-11: 2 mg via INTRAVENOUS

## 2019-11-11 MED ORDER — ONDANSETRON HCL 4 MG/2ML IJ SOLN: 4.0000 mg | Freq: Once | INTRAMUSCULAR | Status: DC | PRN

## 2019-11-11 MED ORDER — PROPOFOL 10 MG/ML IV BOLUS
INTRAVENOUS | Status: DC | PRN
  Administered 2019-11-11: 150 mg via INTRAVENOUS

## 2019-11-11 MED ORDER — LACTATED RINGERS IV SOLN: INTRAVENOUS | Status: DC

## 2019-11-11 MED ORDER — EPHEDRINE 5 MG/ML INJ
INTRAVENOUS | Status: AC
  Filled 2019-11-11: qty 20

## 2019-11-11 MED ORDER — FENTANYL CITRATE (PF) 100 MCG/2ML IJ SOLN
INTRAMUSCULAR | Status: DC | PRN
Start: 2019-11-11 — End: 2019-11-11
  Administered 2019-11-11 (×2): 25 ug via INTRAVENOUS
  Administered 2019-11-11: 50 ug via INTRAVENOUS

## 2019-11-11 SURGICAL SUPPLY — 52 items
ADH SKN CLS APL DERMABOND .7 (GAUZE/BANDAGES/DRESSINGS) ×1
APL PRP STRL LF DISP 70% ISPRP (MISCELLANEOUS) ×1
BINDER BREAST 3XL (GAUZE/BANDAGES/DRESSINGS) IMPLANT
BINDER BREAST LRG (GAUZE/BANDAGES/DRESSINGS) IMPLANT
BINDER BREAST MEDIUM (GAUZE/BANDAGES/DRESSINGS) IMPLANT
BINDER BREAST XLRG (GAUZE/BANDAGES/DRESSINGS) ×2 IMPLANT
BINDER BREAST XXLRG (GAUZE/BANDAGES/DRESSINGS) IMPLANT
BLADE SURG 10 STRL SS (BLADE) ×2 IMPLANT
BLADE SURG 15 STRL LF DISP TIS (BLADE) IMPLANT
BLADE SURG 15 STRL SS (BLADE)
CANISTER SUCT 1200ML W/VALVE (MISCELLANEOUS) IMPLANT
CHLORAPREP W/TINT 26 (MISCELLANEOUS) ×2 IMPLANT
CLIP VESOCCLUDE LG 6/CT (CLIP) ×2 IMPLANT
COVER BACK TABLE 60X90IN (DRAPES) ×2 IMPLANT
COVER MAYO STAND STRL (DRAPES) ×2 IMPLANT
COVER WAND RF STERILE (DRAPES) IMPLANT
DECANTER SPIKE VIAL GLASS SM (MISCELLANEOUS) IMPLANT
DERMABOND ADVANCED (GAUZE/BANDAGES/DRESSINGS) ×1
DERMABOND ADVANCED .7 DNX12 (GAUZE/BANDAGES/DRESSINGS) ×1 IMPLANT
DRAPE LAPAROSCOPIC ABDOMINAL (DRAPES) ×2 IMPLANT
DRAPE UTILITY XL STRL (DRAPES) ×2 IMPLANT
ELECT COATED BLADE 2.86 ST (ELECTRODE) ×2 IMPLANT
ELECT REM PT RETURN 9FT ADLT (ELECTROSURGICAL) ×2
ELECTRODE REM PT RTRN 9FT ADLT (ELECTROSURGICAL) ×1 IMPLANT
GAUZE SPONGE 4X4 12PLY STRL (GAUZE/BANDAGES/DRESSINGS) IMPLANT
GAUZE SPONGE 4X4 12PLY STRL LF (GAUZE/BANDAGES/DRESSINGS) IMPLANT
GLOVE BIO SURGEON STRL SZ 6 (GLOVE) ×2 IMPLANT
GLOVE BIOGEL PI IND STRL 6.5 (GLOVE) ×1 IMPLANT
GLOVE BIOGEL PI INDICATOR 6.5 (GLOVE) ×1
GOWN STRL REUS W/ TWL LRG LVL3 (GOWN DISPOSABLE) ×1 IMPLANT
GOWN STRL REUS W/TWL 2XL LVL3 (GOWN DISPOSABLE) ×2 IMPLANT
GOWN STRL REUS W/TWL LRG LVL3 (GOWN DISPOSABLE) ×2
KIT MARKER MARGIN INK (KITS) ×2 IMPLANT
NEEDLE HYPO 25X1 1.5 SAFETY (NEEDLE) ×2 IMPLANT
NS IRRIG 1000ML POUR BTL (IV SOLUTION) ×2 IMPLANT
PACK BASIN DAY SURGERY FS (CUSTOM PROCEDURE TRAY) ×2 IMPLANT
PENCIL SMOKE EVACUATOR (MISCELLANEOUS) ×2 IMPLANT
SLEEVE SCD COMPRESS KNEE MED (MISCELLANEOUS) ×2 IMPLANT
SPONGE LAP 18X18 RF (DISPOSABLE) ×2 IMPLANT
STAPLER VISISTAT 35W (STAPLE) IMPLANT
STRIP CLOSURE SKIN 1/2X4 (GAUZE/BANDAGES/DRESSINGS) ×2 IMPLANT
SUT MON AB 4-0 PC3 18 (SUTURE) ×2 IMPLANT
SUT SILK 2 0 SH (SUTURE) IMPLANT
SUT VIC AB 3-0 54X BRD REEL (SUTURE) IMPLANT
SUT VIC AB 3-0 BRD 54 (SUTURE)
SUT VIC AB 3-0 SH 27 (SUTURE) ×2
SUT VIC AB 3-0 SH 27X BRD (SUTURE) ×1 IMPLANT
SYR BULB EAR ULCER 3OZ GRN STR (SYRINGE) IMPLANT
SYR CONTROL 10ML LL (SYRINGE) ×2 IMPLANT
TOWEL GREEN STERILE FF (TOWEL DISPOSABLE) ×2 IMPLANT
TUBE CONNECTING 20X1/4 (TUBING) IMPLANT
YANKAUER SUCT BULB TIP NO VENT (SUCTIONS) IMPLANT

## 2019-11-11 NOTE — Anesthesia Preprocedure Evaluation (Addendum)
Anesthesia Evaluation  Patient identified by MRN, date of birth, ID band Patient awake    Reviewed: Allergy & Precautions, H&P , NPO status , Patient's Chart, lab work & pertinent test results, reviewed documented beta blocker date and time   Airway Mallampati: II  TM Distance: >3 FB Neck ROM: full    Dental no notable dental hx.    Pulmonary sleep apnea and Continuous Positive Airway Pressure Ventilation ,    Pulmonary exam normal breath sounds clear to auscultation       Cardiovascular Exercise Tolerance: Good hypertension, Pt. on medications  Rhythm:regular Rate:Normal     Neuro/Psych  Neuromuscular disease negative psych ROS   GI/Hepatic Neg liver ROS, PUD, GERD  Medicated,  Endo/Other  diabetes, Type 2Hypothyroidism BMI 39   Renal/GU negative Renal ROS  negative genitourinary   Musculoskeletal  (+) Arthritis , Osteoarthritis,    Abdominal   Peds  Hematology negative hematology ROS (+)   Anesthesia Other Findings   Reproductive/Obstetrics negative OB ROS                            Anesthesia Physical  Anesthesia Plan  ASA: III  Anesthesia Plan: General   Post-op Pain Management: GA combined w/ Regional for post-op pain   Induction:   PONV Risk Score and Plan: 3 and Ondansetron, Dexamethasone and Treatment may vary due to age or medical condition  Airway Management Planned: LMA  Additional Equipment:   Intra-op Plan:   Post-operative Plan: Extubation in OR  Informed Consent: I have reviewed the patients History and Physical, chart, labs and discussed the procedure including the risks, benefits and alternatives for the proposed anesthesia with the patient or authorized representative who has indicated his/her understanding and acceptance.     Dental Advisory Given  Plan Discussed with: CRNA  Anesthesia Plan Comments:         Anesthesia Quick Evaluation

## 2019-11-11 NOTE — Discharge Instructions (Addendum)
[facility] Office Phone Number [phone]  BREAST BIOPSY/ PARTIAL MASTECTOMY: POST OP INSTRUCTIONS  Always review your discharge instruction sheet given to you by the facility where your surgery was performed.  IF YOU HAVE DISABILITY OR FAMILY LEAVE FORMS, YOU MUST BRING THEM TO THE OFFICE FOR PROCESSING.  DO NOT GIVE THEM TO YOUR DOCTOR.  1. A prescription for pain medication may be given to you upon discharge.  Take your pain medication as prescribed, if needed.  If narcotic pain medicine is not needed, then you may take acetaminophen (Tylenol) or ibuprofen (Advil) as needed. 2. Take your usually prescribed medications unless otherwise directed 3. If you need a refill on your pain medication, please contact your pharmacy.  They will contact our office to request authorization.  Prescriptions will not be filled after 5pm or on week-ends. 4. You should eat very light the first 24 hours after surgery, such as soup, crackers, pudding, etc.  Resume your normal diet the day after surgery. 5. Most patients will experience some swelling and bruising in the breast.  Ice packs and a good support bra will help.  Swelling and bruising can take several days to resolve.  6. It is common to experience some constipation if taking pain medication after surgery.  Increasing fluid intake and taking a stool softener will usually help or prevent this problem from occurring.  A mild laxative (Milk of Magnesia or Miralax) should be taken according to package directions if there are no bowel movements after 48 hours. 7. Unless discharge instructions indicate otherwise, you may remove your bandages 48 hours after surgery, and you may shower at that time.  You may have steri-strips (small skin tapes) in place directly over the incision.  These strips should be left on the skin for 7-10 days.   Any sutures or staples will be removed at the office during your follow-up visit. 8. ACTIVITIES:  You may  resume regular daily activities (gradually increasing) beginning the next day.  Wearing a good support bra or sports bra (or the breast binder) minimizes pain and swelling.  You may have sexual intercourse when it is comfortable. a. You may drive when you no longer are taking prescription pain medication, you can comfortably wear a seatbelt, and you can safely maneuver your car and apply brakes. b. RETURN TO WORK:  __________1 week_______________ 9. You should see your doctor in the office for a follow-up appointment approximately two weeks after your surgery.  Your doctor's nurse will typically make your follow-up appointment when she calls you with your pathology report.  Expect your pathology report 2-3 business days after your surgery.  You may call to check if you do not hear from [place] after three days.   WHEN TO CALL YOUR DOCTOR: 1. Fever over 101.0 2. Nausea and/or vomiting. 3. Extreme swelling or bruising. 4. Continued bleeding from incision. 5. Increased pain, redness, or drainage from the incision.  The clinic staff is available to answer your questions during regular business hours.  Please don't hesitate to call and ask to speak to one of the nurses for clinical concerns.  If you have a medical emergency, go to the nearest emergency room or call 911.  A surgeon from [facility] is always on call at the hospital.  For further questions, please visit [email or web address]     Post Anesthesia Home Care Instructions  Activity: Get plenty of rest for the remainder of the day. A responsible individual must stay  with you for 24 hours following the procedure.  For the next 24 hours, DO NOT: -Drive a car -Paediatric nurse -Drink alcoholic beverages -Take any medication unless instructed by your physician -Make any legal decisions or sign important papers.  Meals: Start with liquid foods such as gelatin or soup. Progress to regular foods as tolerated. Avoid greasy,  spicy, heavy foods. If nausea and/or vomiting occur, drink only clear liquids until the nausea and/or vomiting subsides. Call your physician if vomiting continues.  Special Instructions/Symptoms: Your throat may feel dry or sore from the anesthesia or the breathing tube placed in your throat during surgery. If this causes discomfort, gargle with warm salt water. The discomfort should disappear within 24 hours.  If you had a scopolamine patch placed behind your ear for the management of post- operative nausea and/or vomiting:  1. The medication in the patch is effective for 72 hours, after which it should be removed.  Wrap patch in a tissue and discard in the trash. Wash hands thoroughly with soap and water. 2. You may remove the patch earlier than 72 hours if you experience unpleasant side effects which may include dry mouth, dizziness or visual disturbances. 3. Avoid touching the patch. Wash your hands with soap and water after contact with the patch.    Call your surgeon if you experience:   1.  Fever over 101.0. 2.  Inability to urinate. 3.  Nausea and/or vomiting. 4.  Extreme swelling or bruising at the surgical site. 5.  Continued bleeding from the incision. 6.  Increased pain, redness or drainage from the incision. 7.  Problems related to your pain medication. 8.  Any problems and/or concerns

## 2019-11-11 NOTE — Transfer of Care (Signed)
Immediate Anesthesia Transfer of Care Note  Patient: Michelle Sherman  Procedure(s) Performed: RIGHT RE-EXCISION OF BREAST LUMPECTOMY (Right Breast)  Patient Location: PACU  Anesthesia Type:General  Level of Consciousness: drowsy and patient cooperative  Airway & Oxygen Therapy: Patient Spontanous Breathing and Patient connected to face mask oxygen  Post-op Assessment: Report given to RN and Post -op Vital signs reviewed and stable  Post vital signs: Reviewed and stable  Last Vitals:  Vitals Value Taken Time  BP 129/64 11/11/19 1306  Temp    Pulse 83 11/11/19 1309  Resp 8 11/11/19 1309  SpO2 100 % 11/11/19 1309    Last Pain:  Vitals:   11/11/19 0958  TempSrc: Oral  PainSc: 0-No pain         Complications: No complications documented.

## 2019-11-11 NOTE — Anesthesia Procedure Notes (Signed)
Procedure Name: LMA Insertion Date/Time: 11/11/2019 12:24 PM Performed by: Genelle Bal, CRNA Pre-anesthesia Checklist: Patient identified, Emergency Drugs available, Suction available and Patient being monitored Patient Re-evaluated:Patient Re-evaluated prior to induction Oxygen Delivery Method: Circle system utilized Preoxygenation: Pre-oxygenation with 100% oxygen Induction Type: IV induction Ventilation: Mask ventilation without difficulty LMA: LMA inserted LMA Size: 4.0 Number of attempts: 1 Airway Equipment and Method: Bite block Placement Confirmation: positive ETCO2 Tube secured with: Tape Dental Injury: Teeth and Oropharynx as per pre-operative assessment

## 2019-11-11 NOTE — Anesthesia Postprocedure Evaluation (Signed)
Anesthesia Post Note  Patient: Michelle Sherman  Procedure(s) Performed: RIGHT RE-EXCISION OF BREAST LUMPECTOMY (Right Breast)     Patient location during evaluation: PACU Anesthesia Type: General Level of consciousness: sedated Pain management: pain level controlled Vital Signs Assessment: post-procedure vital signs reviewed and stable Respiratory status: spontaneous breathing and respiratory function stable Cardiovascular status: stable Postop Assessment: no apparent nausea or vomiting Anesthetic complications: no   No complications documented.  Last Vitals:  Vitals:   11/11/19 1353 11/11/19 1354  BP:    Pulse: 73 73  Resp: 19 16  Temp:    SpO2: 97% 96%    Last Pain:  Vitals:   11/11/19 1338  TempSrc:   PainSc: 0-No pain                 Merlinda Frederick

## 2019-11-11 NOTE — Op Note (Signed)
Re-excisional right Breast Lumpectomy   Indications: This patient presents with history of close margins after partial mastectomy for right breast cancer   Pre-operative Diagnosis: right breast cancer   Post-operative Diagnosis: right breast cancer   Surgeon: [name]   Assistants: n/a   Anesthesia: General anesthesia and Local anesthesia 0.5% bupivacaine, with epinephrine   ASA Class: 3   Procedure Details  The patient was seen in the Holding Room. The risks, benefits, complications, treatment options, and expected outcomes were discussed with the patient. The possibilities of reaction to medication, pulmonary aspiration, bleeding, infection, the need for additional procedures, failure to diagnose a condition, and creating a complication requiring transfusion or operation were discussed with the patient. The patient concurred with the proposed plan, giving informed consent. The site of surgery properly noted/marked. The patient was taken to Operating Room # 8, identified, and the procedure verified as re-excision of right breast cancer.  After induction of anesthesia, the right breast and chest were prepped and draped in standard fashion.  The lumpectomy was performed by reopening the prior incision. Seroma was aspirated. The mastopexy sutures were removed. Additional margins were taken at the medial borders of the partial mastectomy cavity. Dissection was carried down to the pectoral fascia. Orientation sutures were placed in the specimens. Hemostasis was achieved with cautery. The wound was irrigated and closed with a 3-0 Vicryl deep dermal interrupted and a 4-0 Monocryl subcuticular closure in layers.  Sterile dressings were applied. At the end of the operation, all sponge, instrument, and needle counts were correct.   Findings:  grossly clear surgical margins, posterior margin is pectoralis, anterior margin is skin.    Estimated Blood Loss: Minimal   Drains: none   Specimens:  additional medial margins.   Complications: None; patient tolerated the procedure well.   Disposition: PACU - hemodynamically stable.   Condition: stable

## 2019-11-11 NOTE — H&P (Signed)
Paddy Walthall Begue Appointment: 11/03/2019 12:15 PM Location: [facility] Patient #: [id number] DOB: 05-May-1950 Married / Language: Michelle Sherman / Race: White Female   History of Present Illness [name] MD; 11/03/2019 12:36 PM) The patient is a 69 year old female who presents with breast cancer. Pt is lovely 69 yo F who is referred by Dr. Miquel Dunn for a new diagnosis of right breast cancer. She had screening detected asymmetry. Diagnostic imaging showed a 8 mm intramammary lymph node at 9 o'clock and a 9 mm mass anterior to this. 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 the patient has no prior h/o cancer, but her brother had pancreatic cancer and her father had lung cancer.   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    Pt underwent seed bracketed lumpectomy and SLN bx 10/14/2019. Nodes were negative The medial seed was close to the edge, so additional margins were taken. Unfortunately, the additional medial margin showed a third small area of cancer with a new positive medial margin. The patient had some soreness, especially in the axilla, but this has improved. She also had a sore area on the right medial breast remote from the surgical site.   pathology 10/14/2019 A. BREAST, RIGHT, LUMPECTOMY: - Multifocal invasive ductal carcinoma with papillary features, grade 2, spanning 0.9 cm and 0.5 cm. - Intermediate grade ductal carcinoma in situ. - Invasive carcinoma present at original inferior margin broadly, final inferior margin (Part F) is negative. - Margins are negative for in situ carcinoma. - Biopsy site. - See oncology table.  B. BREAST, RIGHT ADDITIONAL POSTERIOR MARGIN, EXCISION: - Benign breast tissue.  C. BREAST, RIGHT ADDITIONAL LATERAL MARGIN, EXCISION: - Benign breast tissue.  D. BREAST, RIGHT ADDITIONAL SUPERIOR MARGIN, EXCISION: - Benign breast tissue.  E. BREAST, RIGHT  ADDITIONAL MEDIAL MARGIN, EXCISION: - Invasive ductal carcinoma with papillary features, grade 2, spanning 0.5 cm. - Invasive carcinoma is present at the new medial margin broadly.  F. BREAST, RIGHT ADDITIONAL INFERIOR MARGIN, EXCISION: - Benign breast tissue.  G. LYMPH NODE, RIGHT AXILLARY #1, SENTINEL, EXCISION: - One of one lymph nodes negative for carcinoma (0/1).  H. LYMPH NODE, RIGHT AXILLARY #2, SENTINEL, EXCISION: - One of one lymph nodes negative for carcinoma (0/1).   Allergies (Chanel Teressa Senter, CMA; 11/03/2019 12:10 PM) Nickel  Rash. Drainage Sulfa Antibiotics  Rash. Allergies Reconciled   Medication History (Chanel Teressa Senter, CMA; 11/03/2019 12:10 PM) Aspirin (81MG Tablet DR, Oral) Active. Atorvastatin Calcium (10MG Tablet, Oral) Active. Biotin (5000MCG Tablet, Oral) Active. Calcium Citrate-Vitamin D (250-200MG-UNIT Tablet, Oral) Active. Invokana (300MG Tablet, Oral) Active. Cholecalciferol (50 MCG(2000 UT) Tablet, Oral) Active. Exenatide ER (2MG/0.85ML Auto-injector, Subcutaneous) Active. Fexofenadine HCl (180MG Tablet, Oral) Active. Flonase (50MCG/DOSE Inhaler, Nasal) Active. Synthroid (112MCG Tablet, Oral) Active. Mesalamine (1.2GM Tablet DR, Oral) Active. Altace (10MG Capsule, Oral) Active. Multiple Vitamin (1 (one) Oral) Active. Glucosamine-Chondroitin (500-400MG Tablet, Oral) Active. Medications Reconciled    Review of Systems [name] MD; 11/03/2019 12:36 PM) All other systems negative  Vitals (Chanel Nolan CMA; 11/03/2019 12:10 PM) 11/03/2019 12:10 PM Weight: 198 lb Height: 60in Body Surface Area: 1.86 m Body Mass Index: 38.67 kg/m  Temp.: 33F  Pulse: 83 (Regular)  BP: 126/76(Sitting, Left Arm, Standard)       Physical Exam [name] MD; 11/03/2019 12:37 PM) Breast Note: right inferolateral surgical site healing well. No seroma palpable. axillary incision also healing well. the right upper inner quadrant  has a slightly thickened area that is a bit warm. non tender.     Assessment & Plan [name] MD; 11/03/2019 12:37 PM) MALIGNANT NEOPLASM OF UPPER-OUTER QUADRANT OF RIGHT BREAST IN FEMALE, ESTROGEN RECEPTOR POSITIVE (C50.411) Impression: Oncotype low risk.  Will plan reexcision. Current Plans Follow up with [place] in the office in 1 month.  Call us sooner as needed.  You are being scheduled for surgery- Our schedulers will call you.  You should hear from our office's scheduling department within 5 working days about the location, date, and time of surgery. We try to make accommodations for patient's preferences in scheduling surgery, but sometimes the OR schedule or the surgeon's schedule prevents [place] from making those accommodations.  If you have not heard from our office [phone]) in 5 working days, call the office and ask for your surgeon's nurse.  If you have other questions about your diagnosis, plan, or surgery, call the office and ask for your surgeon's nurse.    Signed electronically by [name], MD (11/03/2019 12:38 PM)

## 2019-11-12 ENCOUNTER — Encounter (HOSPITAL_BASED_OUTPATIENT_CLINIC_OR_DEPARTMENT_OTHER): Payer: Self-pay | Admitting: General Surgery

## 2019-11-13 LAB — SURGICAL PATHOLOGY

## 2019-11-17 ENCOUNTER — Encounter: Payer: Self-pay | Admitting: *Deleted

## 2019-11-21 DIAGNOSIS — G4733 Obstructive sleep apnea (adult) (pediatric): Secondary | ICD-10-CM | POA: Diagnosis not present

## 2019-11-21 DIAGNOSIS — I10 Essential (primary) hypertension: Secondary | ICD-10-CM | POA: Diagnosis not present

## 2019-12-01 ENCOUNTER — Other Ambulatory Visit: Payer: Self-pay

## 2019-12-01 ENCOUNTER — Encounter: Payer: Self-pay | Admitting: Family Medicine

## 2019-12-01 MED ORDER — CANAGLIFLOZIN 300 MG PO TABS
300.0000 mg | ORAL_TABLET | Freq: Every day | ORAL | 3 refills | Status: DC
Start: 2019-12-01 — End: 2020-01-07

## 2019-12-03 DIAGNOSIS — H2513 Age-related nuclear cataract, bilateral: Secondary | ICD-10-CM | POA: Diagnosis not present

## 2019-12-03 DIAGNOSIS — E119 Type 2 diabetes mellitus without complications: Secondary | ICD-10-CM | POA: Diagnosis not present

## 2019-12-03 DIAGNOSIS — H5213 Myopia, bilateral: Secondary | ICD-10-CM | POA: Diagnosis not present

## 2019-12-11 ENCOUNTER — Encounter: Payer: Self-pay | Admitting: Radiation Oncology

## 2019-12-11 ENCOUNTER — Other Ambulatory Visit: Payer: Self-pay

## 2019-12-11 ENCOUNTER — Ambulatory Visit
Admission: RE | Admit: 2019-12-11 | Discharge: 2019-12-11 | Disposition: A | Discharge status: Home / Self Care | Payer: Medicare Other | Source: Ambulatory Visit | Attending: Radiation Oncology | Admitting: Radiation Oncology

## 2019-12-11 ENCOUNTER — Ambulatory Visit: Payer: Medicare Other

## 2019-12-11 VITALS — BP 115/58 | HR 80 | Temp 98.1°F | Resp 18 | Ht 60.0 in | Wt 197.6 lb

## 2019-12-11 DIAGNOSIS — Z79899 Other long term (current) drug therapy: Secondary | ICD-10-CM | POA: Insufficient documentation

## 2019-12-11 DIAGNOSIS — C50411 Malignant neoplasm of upper-outer quadrant of right female breast: Secondary | ICD-10-CM | POA: Insufficient documentation

## 2019-12-11 DIAGNOSIS — Z170 Estrogen receptor positive status [ER+]: Secondary | ICD-10-CM | POA: Diagnosis not present

## 2019-12-11 DIAGNOSIS — Z510 Encounter for antineoplastic radiation therapy: Secondary | ICD-10-CM | POA: Diagnosis not present

## 2019-12-11 DIAGNOSIS — E079 Disorder of thyroid, unspecified: Secondary | ICD-10-CM | POA: Insufficient documentation

## 2019-12-11 DIAGNOSIS — K5190 Ulcerative colitis, unspecified, without complications: Secondary | ICD-10-CM | POA: Insufficient documentation

## 2019-12-11 DIAGNOSIS — Z808 Family history of malignant neoplasm of other organs or systems: Secondary | ICD-10-CM | POA: Diagnosis not present

## 2019-12-11 DIAGNOSIS — G4730 Sleep apnea, unspecified: Secondary | ICD-10-CM | POA: Diagnosis not present

## 2019-12-11 DIAGNOSIS — Z7982 Long term (current) use of aspirin: Secondary | ICD-10-CM | POA: Insufficient documentation

## 2019-12-11 DIAGNOSIS — E119 Type 2 diabetes mellitus without complications: Secondary | ICD-10-CM | POA: Insufficient documentation

## 2019-12-11 DIAGNOSIS — Z809 Family history of malignant neoplasm, unspecified: Secondary | ICD-10-CM | POA: Diagnosis not present

## 2019-12-11 DIAGNOSIS — Z7984 Long term (current) use of oral hypoglycemic drugs: Secondary | ICD-10-CM | POA: Diagnosis not present

## 2019-12-11 DIAGNOSIS — K219 Gastro-esophageal reflux disease without esophagitis: Secondary | ICD-10-CM | POA: Diagnosis not present

## 2019-12-11 DIAGNOSIS — Z801 Family history of malignant neoplasm of trachea, bronchus and lung: Secondary | ICD-10-CM | POA: Insufficient documentation

## 2019-12-11 DIAGNOSIS — N6459 Other signs and symptoms in breast: Secondary | ICD-10-CM | POA: Diagnosis not present

## 2019-12-11 DIAGNOSIS — I10 Essential (primary) hypertension: Secondary | ICD-10-CM | POA: Insufficient documentation

## 2019-12-11 DIAGNOSIS — M129 Arthropathy, unspecified: Secondary | ICD-10-CM | POA: Insufficient documentation

## 2019-12-11 DIAGNOSIS — Z9889 Other specified postprocedural states: Secondary | ICD-10-CM | POA: Diagnosis not present

## 2019-12-11 NOTE — Progress Notes (Addendum)
Radiation Oncology         [phone] ________________________________  Name: Michelle Sherman        MRN: [id number]  Date of Service: 12/11/2019 DOB: 01-May-1950  CC:Leamon Arnt, MD  Truitt Merle, MD     REFERRING PHYSICIAN: Truitt Merle, MD   DIAGNOSIS: The encounter diagnosis was Malignant neoplasm of upper-outer quadrant of right breast in female, estrogen receptor positive ([place]).   HISTORY OF PRESENT ILLNESS: Michelle Sherman is a 69 y.o. female seen in the multidisciplinary breast clinic for a new diagnosis of right breast cancer. The patient was noted to have a screening detected right breast mass. This was seen at 9:00 measuring 9 x 6 x 6 mm with an 8 mm intramammary abnormality, possibly a node. 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 Her cancer was ER/PR positive, HER2 was negative with a Ki 67 of 5%. She subsequently underwent lumpectomy of the right breast with sentinel lymph node biopsy on 10/14/2019 which revealed a grade 2 invasive ductal carcinoma with papillary features, the tumor was 9 mm in greatest dimension with associated intermediate grade DCIS.  Her tumor was ER/PR positive HER-2 negative with a Ki-67 of 5%, her 2 sampled lymph nodes were negative, but her margins were positive with invasive disease seen in the medial margin spanning 5 mm.  There was also still present at the new medial margin.  She underwent reexcision of her margins and a separate surgery on 11/11/2019 and her margins were cleared.  Her Oncotype score was 0, and no systemic chemotherapy has been recommended.  Her case was rediscussed in multidisciplinary breast oncology conference and it was recommended that she receive adjuvant radiotherapy and antiestrogen therapy.  She is seen today to discuss these options.    PREVIOUS RADIATION THERAPY: No   PAST  MEDICAL HISTORY:  Past Medical History:  Diagnosis Date  . Allergy   . Arthritis   . Breast cancer ([place]) 08/2019   right breast IDC  . Complication of anesthesia    unable to void after surgery and had to stay overnight   . Diabetes mellitus without complication ([place])   . Family history of adverse reaction to anesthesia    sister, hard to wake up   . Family history of lung cancer   . Family history of pancreatic cancer   . GERD (gastroesophageal reflux disease)   . Hypertension   . Sleep apnea    wears CPAP nightly  . Thyroid disease   . Ulcerative colitis ([place])        PAST SURGICAL HISTORY: Past Surgical History:  Procedure Laterality Date  . BREAST LUMPECTOMY WITH RADIOACTIVE SEED AND SENTINEL LYMPH NODE BIOPSY Right 10/14/2019   Procedure: RIGHT BREAST LUMPECTOMY WITH RADIOACTIVE SEED AND SENTINEL LYMPH NODE BIOPSY;  Surgeon: [name], MD;  Location: [facility];  Service: General;  Laterality: Right;  . CHOLECYSTECTOMY    . RE-EXCISION OF BREAST LUMPECTOMY Right 11/11/2019   Procedure: RIGHT RE-EXCISION OF BREAST LUMPECTOMY;  Surgeon: [name], MD;  Location: [facility];  Service: General;  Laterality: Right;  RNFA  . SKIN SURGERY  12/2015   Fatty tumor removed  . TONSILLECTOMY AND ADENOIDECTOMY  1956     FAMILY HISTORY:  Family History  Problem Relation Age of Onset  . Ulcerative colitis Mother   .  Diabetes Mother   . Heart disease Father   . Lung cancer Father 80       Lung  . Varicose Veins Sister   . Other Sister        "stiff heart"  . Diabetes Brother   . Pancreatic cancer Brother 25       Pancreatic  . Diabetes Maternal Aunt   . Diabetes Maternal Uncle   . Heart attack Paternal Grandfather   . Crohn's disease Brother   . Ulcerative colitis Brother   . Diabetes Brother   . Cancer Brother        unsure of type  . Lung cancer Paternal Aunt        smoker  . Cancer Paternal Aunt        unknown type      SOCIAL HISTORY:  reports that she has never smoked. She has never used smokeless tobacco. She reports current alcohol use. She reports that she does not use drugs. The patient is married and lives in [place]. She has a grandson she helps care for. Her husband is disabled and she also is his main caregiver.   ALLERGIES: Nickel and Sulfa antibiotics   MEDICATIONS:  Current Outpatient Medications  Medication Sig Dispense Refill  . aspirin EC 81 MG tablet Take 1 tablet by mouth daily.    Marland Kitchen atorvastatin (LIPITOR) 10 MG tablet Take 1 tablet (10 mg total) by mouth every evening. 90 tablet 3  . [place]-Glucosamine-Vit D (OSTEO BI-FLEX ONE PER DAY PO) Take by mouth daily.    . canagliflozin (INVOKANA) 300 MG TABS tablet Take 1 tablet (300 mg total) by mouth daily. 90 tablet 3  . Cholecalciferol (VITAMIN D3) 2000 units capsule Take 1 capsule by mouth daily.    . famotidine (PEPCID) 40 MG tablet Take 40 mg by mouth 2 (two) times daily.    Marland Kitchen levothyroxine (SYNTHROID) 112 MCG tablet Take 1 tablet (112 mcg total) by mouth daily. 90 tablet 3  . mesalamine (LIALDA) 1.2 g EC tablet Take by mouth. Take 2 tablet in am and 2 tablet in pm    . metFORMIN (GLUCOPHAGE) 1000 MG tablet Take 1 tablet (1,000 mg total) by mouth 2 (two) times daily with a meal. 180 tablet 3  . Misc Natural Products (TURMERIC CURCUMIN) CAPS Take 1 capsule by mouth daily.    . Multiple Vitamin (MULTIVITAMIN) tablet Take 1 tablet by mouth daily.    Marland Kitchen oxyCODONE (OXY IR/ROXICODONE) 5 MG immediate release tablet Take 1 tablet (5 mg total) by mouth every 6 (six) hours as needed for severe pain. (Patient not taking: Reported on 11/10/2019) 15 tablet 0  . ramipril (ALTACE) 10 MG capsule Take 1 capsule (10 mg total) by mouth daily. 90 capsule 3   No current facility-administered medications for this encounter.     REVIEW OF SYSTEMS: On review of systems, the patient reports that she is doing well overall. No specific breast  complaints are verbalized.    PHYSICAL EXAM:  Wt Readings from Last 3 Encounters:  11/11/19 197 lb 15.6 oz (89.8 kg)  10/14/19 196 lb 10.4 oz (89.2 kg)  09/24/19 197 lb 11.2 oz (89.7 kg)   Temp Readings from Last 3 Encounters:  11/11/19 98 F (36.7 C)  10/14/19 97.7 F (36.5 C)  09/24/19 97.8 F (36.6 C)   BP Readings from Last 3 Encounters:  11/11/19 129/63  10/14/19 123/69  09/24/19 124/73   Pulse Readings from Last 3 Encounters:  11/11/19 81  10/14/19 79  09/24/19 84    In general this is a well appearing caucasian female in no acute distress. She's alert and oriented x4 and appropriate throughout the examination. Cardiopulmonary assessment is negative for acute distress and she exhibits normal effort. The right breast is intact. There's more fullness to the breast but no distinct pockets of fluctuance or notable fluid. No chest wall, axillary or RUE edema is present.  ECOG = 0  0 - Asymptomatic (Fully active, able to carry on all predisease activities without restriction)  1 - Symptomatic but completely ambulatory (Restricted in physically strenuous activity but ambulatory and able to carry out work of a light or sedentary nature. For example, light housework, office work)  2 - Symptomatic, <50% in bed during the day (Ambulatory and capable of all self care but unable to carry out any work activities. Up and about more than 50% of waking hours)  3 - Symptomatic, >50% in bed, but not bedbound (Capable of only limited self-care, confined to bed or chair 50% or more of waking hours)  4 - Bedbound (Completely disabled. Cannot carry on any self-care. Totally confined to bed or chair)  5 - Death   Eustace Pen MM, Creech RH, Tormey DC, et al. [phone]). "Toxicity and response criteria of the [facility] Group". [place] Oncol. 5 (6): 649-55    LABORATORY DATA:  Lab Results  Component Value Date   WBC 7.5 09/24/2019   HGB 15.0 09/24/2019   HCT 47.3 (H)  09/24/2019   MCV 91.3 09/24/2019   PLT 323 09/24/2019   Lab Results  Component Value Date   NA 138 11/07/2019   K 4.4 11/07/2019   CL 101 11/07/2019   CO2 28 11/07/2019   Lab Results  Component Value Date   ALT 15 09/24/2019   AST 13 (L) 09/24/2019   ALKPHOS 81 09/24/2019   BILITOT 0.5 09/24/2019      RADIOGRAPHY: No results found.     IMPRESSION/PLAN: 1. Stage IA, [id number] grade 1, ER/PR positive invasive ductal carcinoma of the right breast. Dr. Lisbeth Renshaw discusses the final  pathology findings and reviews the nature of early stage right breast disease. We reviewed the rationale for external radiotherapy to the breast followed by antiestrogen therapy. We discussed the risks, benefits, short, and long term effects of radiotherapy, and the patient is interested in proceeding. Dr. Lisbeth Renshaw discusses the delivery and logistics of radiotherapy and recommends 4  weeks of radiotherapy. Written consent is obtained and placed in the chart, a copy was provided to the patient. She will simulate this morning and start the week of 12/22/19. 2. Right breast fullness. I don't note an area of fluid that needs to be drained, so I think her fullness is in the tissue. She was encouraged to go back to wearing a tighter sports bra, but to let us know if this doesn't improve so we can get her into PT for evaluation. This should not disrupt beginning radiation.   In a visit lasting 45 minutes, greater than 50% of the time was spent face to face reviewing her case, as well as in preparation of, discussing, and coordinating the patient's care.  The above documentation reflects my direct findings during this shared patient visit. Please see the separate note by Dr. Lisbeth Renshaw on this date for the remainder of the patient's plan of care.    Carola Rhine, PAC

## 2019-12-14 ENCOUNTER — Encounter: Payer: Self-pay | Admitting: Family Medicine

## 2019-12-15 ENCOUNTER — Other Ambulatory Visit: Payer: Self-pay

## 2019-12-15 MED ORDER — LEVOTHYROXINE SODIUM 112 MCG PO TABS: 112.0000 ug | ORAL_TABLET | Freq: Every day | ORAL | 3 refills | Status: DC

## 2019-12-17 DIAGNOSIS — C50411 Malignant neoplasm of upper-outer quadrant of right female breast: Secondary | ICD-10-CM | POA: Diagnosis not present

## 2019-12-17 DIAGNOSIS — Z510 Encounter for antineoplastic radiation therapy: Secondary | ICD-10-CM | POA: Diagnosis not present

## 2019-12-17 DIAGNOSIS — Z170 Estrogen receptor positive status [ER+]: Secondary | ICD-10-CM | POA: Diagnosis not present

## 2019-12-22 ENCOUNTER — Other Ambulatory Visit: Payer: Self-pay

## 2019-12-22 ENCOUNTER — Ambulatory Visit
Admission: RE | Admit: 2019-12-22 | Discharge: 2019-12-22 | Disposition: A | Discharge status: Home / Self Care | Payer: Medicare Other | Source: Ambulatory Visit | Attending: Radiation Oncology | Admitting: Radiation Oncology

## 2019-12-22 ENCOUNTER — Encounter: Payer: Self-pay | Admitting: *Deleted

## 2019-12-22 ENCOUNTER — Ambulatory Visit: Payer: Medicare Other | Attending: General Surgery

## 2019-12-22 DIAGNOSIS — R293 Abnormal posture: Secondary | ICD-10-CM | POA: Insufficient documentation

## 2019-12-22 DIAGNOSIS — Z170 Estrogen receptor positive status [ER+]: Secondary | ICD-10-CM | POA: Insufficient documentation

## 2019-12-22 DIAGNOSIS — G8929 Other chronic pain: Secondary | ICD-10-CM

## 2019-12-22 DIAGNOSIS — M25511 Pain in right shoulder: Secondary | ICD-10-CM | POA: Insufficient documentation

## 2019-12-22 DIAGNOSIS — C50411 Malignant neoplasm of upper-outer quadrant of right female breast: Secondary | ICD-10-CM | POA: Diagnosis not present

## 2019-12-22 DIAGNOSIS — Z483 Aftercare following surgery for neoplasm: Secondary | ICD-10-CM | POA: Diagnosis not present

## 2019-12-22 DIAGNOSIS — Z510 Encounter for antineoplastic radiation therapy: Secondary | ICD-10-CM | POA: Diagnosis not present

## 2019-12-22 NOTE — Therapy (Signed)
[place], [place], [address] Phone: [phone]   Fax:  [phone]  Physical Therapy Evaluation  Patient Details  Name: Michelle Sherman MRN: [id number] Date of Birth: 1950/06/17 Referring Provider (PT): Dr. [name]   Encounter Date: 12/22/2019   PT End of Session - 12/22/19 2136    Visit Number 1    Number of Visits 13    Date for PT Re-Evaluation 02/02/20    PT Start Time 9509    PT Stop Time 1502    PT Time Calculation (min) 59 min    Activity Tolerance Patient tolerated treatment well    Behavior During Therapy [facility] for tasks assessed/performed           Past Medical History:  Diagnosis Date  . Allergy   . Arthritis   . Breast cancer ([place]) 08/2019   right breast IDC  . Complication of anesthesia    unable to void after surgery and had to stay overnight   . Diabetes mellitus without complication ([place])   . Family history of adverse reaction to anesthesia    sister, hard to wake up   . Family history of lung cancer   . Family history of pancreatic cancer   . GERD (gastroesophageal reflux disease)   . Hypertension   . Sleep apnea    wears CPAP nightly  . Thyroid disease   . Ulcerative colitis [facility])     Past Surgical History:  Procedure Laterality Date  . BREAST LUMPECTOMY WITH RADIOACTIVE SEED AND SENTINEL LYMPH NODE BIOPSY Right 10/14/2019   Procedure: RIGHT BREAST LUMPECTOMY WITH RADIOACTIVE SEED AND SENTINEL LYMPH NODE BIOPSY;  Surgeon: [name], MD;  Location: [place];  Service: General;  Laterality: Right;  . CHOLECYSTECTOMY    . RE-EXCISION OF BREAST LUMPECTOMY Right 11/11/2019   Procedure: RIGHT RE-EXCISION OF BREAST LUMPECTOMY;  Surgeon: [name], MD;  Location: [place];  Service: General;  Laterality: Right;  RNFA  . SKIN SURGERY  12/2015   Fatty tumor removed  . TONSILLECTOMY AND ADENOIDECTOMY  1956    There were no  vitals filed for this visit.    Subjective Assessment - 12/22/19 1410    Subjective When she saw MD for follow up after surgery  she continued to have right shoulder pain that was not related to her breast surgery.  Has had problems for 2-3 years with right shoulder.  Had some physical therapy at [facility] and it did improve until the last several months.  Pain is increased with reaching, but pain can be in her shoulder and feels like she gets tight up into her neck.  It can be painful behind shoulder as well. Pain is intermittent.  She had an injection from PCP prior to therapy last time that also seemed to help. Has trouble sleeping on right shoulder at night.  She will start radiation today.    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 She had a right lumpectomy and sentinel node biopsy (2 negative nodes) on 10/14/2019. It is ER/PR positive and HER2 negative with a Ki67 of 5%. On 11/11/19 she had another surgery to gain clear margins.She cares for her husband who is in a motorized wheelchair and has bilateral BKA.    Limitations House hold activities    Patient Stated Goals Help with right arm pain,    Currently in Pain? --  Pain Score --    Pain Location Shoulder    Pain Orientation Right    Pain Descriptors / Indicators Sharp;Aching    Pain Type Chronic pain    Pain Onset More than a month ago    Pain Frequency Intermittent    Aggravating Factors  sleeping on it, raising arm,    Pain Relieving Factors CBD cream helps some, voltaren doesn't seem to help,  Prior PT helped              [facility] PT Assessment - 12/22/19 0001      Assessment   Medical Diagnosis s/p right lumpectomy and SLNB    Referring Provider (PT) Dr. [name]    Onset Date/Surgical Date 10/14/19    Hand Dominance Right      Precautions   Precautions Other (comment)    Precaution Comments recent surgery and right arm lymphedema risk       Restrictions   Weight Bearing Restrictions No      Balance Screen   Has the patient fallen in the past 6 months No    Has the patient had a decrease in activity level because of a fear of falling?  No    Is the patient reluctant to leave their home because of a fear of falling?  No      Home Ecologist residence    Living Arrangements Spouse/significant other    Available Help at Discharge Family      Prior Function   Level of Independence Independent    Vocation Unemployed    Leisure trying to walk more      Cognition   Overall Cognitive Status Within Functional Limits for tasks assessed      Observation/Other Assessments   Observations Both the right breast and axillary incision are well healed and non tender      Posture/Postural Control   Posture/Postural Control Postural limitations    Postural Limitations Forward head;Rounded Shoulders      ROM / Strength   AROM / PROM / Strength AROM      AROM   AROM Assessment Site Shoulder    Right/Left Shoulder Right    Right Shoulder Extension 54 Degrees    Right Shoulder Flexion 148 Degrees    Right Shoulder ABduction 85 Degrees    Right Shoulder Internal Rotation 53 Degrees    Right Shoulder External Rotation 83 Degrees    Left Shoulder Extension 64 Degrees    Left Shoulder Flexion 157 Degrees    Left Shoulder ABduction 174 Degrees    Left Shoulder Internal Rotation 70 Degrees    Left Shoulder External Rotation 95 Degrees      PROM   Right Shoulder Flexion 157 Degrees    Right Shoulder ABduction 160 Degrees    Right Shoulder Internal Rotation 75 Degrees    Right Shoulder External Rotation 65 Degrees      Strength   Right Shoulder Flexion 3+/5   with pain   Right Shoulder ABduction 3-/5    Right Shoulder Internal Rotation 4/5   mild discomfort   Right Shoulder External Rotation 4/5    Left Shoulder Flexion 4+/5    Left Shoulder ABduction 4+/5    Left Shoulder Internal Rotation  4+/5    Left Shoulder External Rotation 4/5      Palpation   Palpation comment --   tender at supraspinatus insertion and proximal biceps/tight      Hawkins-Kennedy test   Findings  Positive    Side Right      Empty Can test   Findings Positive    Side Right             LYMPHEDEMA/ONCOLOGY QUESTIONNAIRE - 12/22/19 0001      Surgeries   Lumpectomy Date 10/14/19    Sentinel Lymph Node Biopsy Date 10/14/19    Other Surgery Date 11/11/19   reexcision for clearer margins   Number Lymph Nodes Removed 2      Treatment   Active Chemotherapy Treatment No    Past Chemotherapy Treatment No    Active Radiation Treatment Yes    Past Radiation Treatment No    Current Hormone Treatment No    Past Hormone Therapy No      What other symptoms do you have   Are you Having Heaviness or Tightness Yes   right shoulder   Are you having Pain Yes   right shoulder   Are you having pitting edema No    Is it Hard or Difficult finding clothes that fit No    Do you have infections No    Is there Decreased scar mobility No    Stemmer Sign No      Right Upper Extremity Lymphedema   10 cm Proximal to Olecranon Process 35.7 cm    Olecranon Process 28.6 cm    10 cm Proximal to Ulnar Styloid Process 25 cm    Just Proximal to Ulnar Styloid Process 16.2 cm    Across Hand at [employer] 19.3 cm    At [place] of 2nd Digit 6.1 cm      Left Upper Extremity Lymphedema   10 cm Proximal to Olecranon Process 33.4 cm    Olecranon Process 27.3 cm    10 cm Proximal to Ulnar Styloid Process 23.2 cm    Just Proximal to Ulnar Styloid Process 16 cm    Across Hand at [employer] 18.7 cm    At [place] of 2nd Digit 5.8 cm                 Quick Dash - 12/22/19 0001    Open a tight or new jar Moderate difficulty    Do heavy household chores (wash walls, wash floors) Mild difficulty    Carry a shopping bag or briefcase Mild difficulty    Wash your back Mild difficulty    Use a knife to cut food No  difficulty    Recreational activities in which you take some force or impact through your arm, shoulder, or hand (golf, hammering, tennis) No difficulty    During the past week, to what extent has your arm, shoulder or hand problem interfered with your normal social activities with family, friends, neighbors, or groups? Slightly    During the past week, to what extent has your arm, shoulder or hand problem limited your work or other regular daily activities Not at all    Arm, shoulder, or hand pain. Moderate    Tingling (pins and needles) in your arm, shoulder, or hand Mild    Difficulty Sleeping Moderate difficulty    DASH Score 25 %            Objective measurements completed on examination: See above findings.                    PT Long Term Goals - 12/22/19 2147      PT LONG TERM GOAL #1   Title pt  will be independent in HEP for right shoulder ROM/strenght    Time 4    Period Weeks    Status New    Target Date 01/19/20      PT LONG TERM GOAL #2   Title Pt will have decreased right shoulder pain by 50% or greater    Time 6    Period Weeks    Status New    Target Date 02/02/20      PT LONG TERM GOAL #3   Title Pt to demo improved strength of R shoulder, to at least 4+/5 to improve ability for reaching, lifting, and IADLS.     Time 6    Period Weeks    Status New    Target Date 02/02/20      PT LONG TERM GOAL #4   Title Pt will have AROM right shoulder WNL when compared with left    Baseline abd 85 A    Time 6    Period Weeks    Status New    Target Date 02/02/20      PT LONG TERM GOAL #5   Title Quick dash will be no greater than 10% to demonstrate improved right UE function    Time 6    Period Weeks    Status New      Additional Long Term Goals   Additional Long Term Goals Yes      PT LONG TERM GOAL #6   Title Pt will  be fit for prophylactic sleeve prn    Time 4    Period Weeks    Status New                  Plan - 12/22/19  2138    Clinical Impression Statement Pts. presents for therapy today to address her chronic right shoulder pain.  Pain has been present for 2-3 years and pt did benefit from prior injection and therapy.  She has positive impingement and empty can tests.  She is especially limited with AROM for abduction and IR today, and notes intermittent pain with reaching, performing heavier chores and sleeping. Incisions are wll healed and there is no evident breast swelling today.  right UE does measure slightly larger with circumference measures today and when comparing forearms may be slightly swollen.  We will keep an eye on this.  She does start radiation today    Personal Factors and Comorbidities Comorbidity 1    Comorbidities right lumpectomy with SLNB    Examination-Activity Limitations Caring for Others;Lift;Sleep;Reach Overhead    Examination-Participation Restrictions Cleaning    Stability/Clinical Decision Making Stable/Uncomplicated    Clinical Decision Making Low    Rehab Potential Excellent    PT Frequency 2x / week    PT Duration 6 weeks    PT Treatment/Interventions ADLs/Self Care Home Management;Therapeutic exercise;Patient/family education;Iontophoresis 10m/ml Dexamethasone;Neuromuscular re-education;Manual techniques;Manual lymph drainage;Compression bandaging;Passive range of motion;Taping    PT Next Visit Plan Strengthening of right shoulder ( impingement), postural education, consider Ionto to supraspinatus insertion, PROM prn, UT stretches, STW prnKeep watch on right UE for swelling, prophylactic compression sleeve?   Consulted and Agree with Plan of Care Patient           Patient will benefit from skilled therapeutic intervention in order to improve the following deficits and impairments:  Decreased knowledge of precautions, Pain, Postural dysfunction, Decreased range of motion, Impaired UE functional use, Decreased activity tolerance, Decreased strength  Visit  Diagnosis: Malignant neoplasm of upper-outer quadrant  of right breast in female, estrogen receptor positive ([place])  Chronic right shoulder pain  Abnormal posture  Aftercare following surgery for neoplasm     Problem List Patient Active Problem List   Diagnosis Date Noted  . Genetic testing 10/06/2019  . Family history of pancreatic cancer   . Family history of lung cancer   . Malignant neoplasm of upper-outer quadrant of right breast in female, estrogen receptor positive ([place]) 09/18/2019  . Incomplete uterovaginal prolapse 04/03/2019  . Need for shingles vaccine 06/27/2018  . 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 Plantar fasciitis 05/06/2017  . Controlled type 2 diabetes mellitus without complication, without long-term current use of insulin ([place]) 11/01/2016  . Hypertension associated with diabetes ([place]) 11/01/2016  . Severe obesity (BMI 35.0-35.9 with comorbidity) ([place]) 11/01/2016  . Combined hyperlipidemia associated with type 2 diabetes mellitus ([place]) 01/15/2012  . Allergic rhinitis 10/05/2011  . Ulcerative colitis without complications ([place]) 24/58/0998  . Diverticulosis of colon 10/17/2010  . Internal hemorrhoids 10/17/2010  . Acquired hypothyroidism 10/04/2010  . Osteoarthrosis of knee 10/04/2010  . OSA on CPAP 06/28/2010    Claris Pong 12/22/2019, 9:56 PM  [place], [place], [address] Phone: [phone]   Fax:  [phone]  Name: Michelle Sherman MRN: [id number] Date of Birth: 08/11/1950 Cheral Almas, PT 12/22/19 9:57 PM

## 2019-12-23 ENCOUNTER — Ambulatory Visit
Admission: RE | Admit: 2019-12-23 | Discharge: 2019-12-23 | Disposition: A | Discharge status: Home / Self Care | Payer: Medicare Other | Source: Ambulatory Visit | Attending: Radiation Oncology | Admitting: Radiation Oncology

## 2019-12-23 DIAGNOSIS — Z170 Estrogen receptor positive status [ER+]: Secondary | ICD-10-CM | POA: Diagnosis not present

## 2019-12-23 DIAGNOSIS — C50411 Malignant neoplasm of upper-outer quadrant of right female breast: Secondary | ICD-10-CM | POA: Diagnosis not present

## 2019-12-23 DIAGNOSIS — Z510 Encounter for antineoplastic radiation therapy: Secondary | ICD-10-CM | POA: Diagnosis not present

## 2019-12-24 ENCOUNTER — Ambulatory Visit
Admission: RE | Admit: 2019-12-24 | Discharge: 2019-12-24 | Disposition: A | Discharge status: Home / Self Care | Payer: Medicare Other | Source: Ambulatory Visit | Attending: Radiation Oncology | Admitting: Radiation Oncology

## 2019-12-24 ENCOUNTER — Telehealth: Payer: Self-pay | Admitting: Hematology

## 2019-12-24 DIAGNOSIS — C50411 Malignant neoplasm of upper-outer quadrant of right female breast: Secondary | ICD-10-CM | POA: Diagnosis not present

## 2019-12-24 DIAGNOSIS — Z170 Estrogen receptor positive status [ER+]: Secondary | ICD-10-CM | POA: Insufficient documentation

## 2019-12-24 DIAGNOSIS — Z510 Encounter for antineoplastic radiation therapy: Secondary | ICD-10-CM | POA: Diagnosis not present

## 2019-12-24 NOTE — Telephone Encounter (Signed)
Scheduled apt per 11/29 sch msg  - mailed reminder letter with appt date and time

## 2019-12-25 ENCOUNTER — Ambulatory Visit
Admission: RE | Admit: 2019-12-25 | Discharge: 2019-12-25 | Disposition: A | Discharge status: Home / Self Care | Payer: Medicare Other | Source: Ambulatory Visit | Attending: Radiation Oncology | Admitting: Radiation Oncology

## 2019-12-25 DIAGNOSIS — Z510 Encounter for antineoplastic radiation therapy: Secondary | ICD-10-CM | POA: Diagnosis not present

## 2019-12-25 DIAGNOSIS — Z170 Estrogen receptor positive status [ER+]: Secondary | ICD-10-CM | POA: Diagnosis not present

## 2019-12-25 DIAGNOSIS — C50411 Malignant neoplasm of upper-outer quadrant of right female breast: Secondary | ICD-10-CM | POA: Diagnosis not present

## 2019-12-25 NOTE — Progress Notes (Signed)
Pt here for patient teaching.  Pt given Radiation and You booklet, skin care instructions, Alra deodorant and Radiaplex gel.  Reviewed areas of pertinence such as fatigue, hair loss, skin changes, breast tenderness and breast swelling . Pt able to give teach back of to pat skin and use unscented/gentle soap,apply Radiaplex bid, avoid applying anything to skin within 4 hours of treatment, avoid wearing an under wire bra and to use an electric razor if they must shave. Pt verbalizes understanding of information given and will contact nursing with any questions or concerns.     Aadam Zhen M. Ether Wolters RN, BSN      

## 2019-12-26 ENCOUNTER — Ambulatory Visit
Admission: RE | Admit: 2019-12-26 | Discharge: 2019-12-26 | Disposition: A | Discharge status: Home / Self Care | Payer: Medicare Other | Source: Ambulatory Visit | Attending: Radiation Oncology | Admitting: Radiation Oncology

## 2019-12-26 ENCOUNTER — Other Ambulatory Visit: Payer: Self-pay

## 2019-12-26 DIAGNOSIS — C50411 Malignant neoplasm of upper-outer quadrant of right female breast: Secondary | ICD-10-CM | POA: Diagnosis not present

## 2019-12-26 DIAGNOSIS — Z510 Encounter for antineoplastic radiation therapy: Secondary | ICD-10-CM | POA: Diagnosis not present

## 2019-12-26 DIAGNOSIS — Z170 Estrogen receptor positive status [ER+]: Secondary | ICD-10-CM | POA: Diagnosis not present

## 2019-12-26 MED ORDER — ALRA NON-METALLIC DEODORANT (RAD-ONC)
1.0000 "application " | Freq: Once | TOPICAL | Status: AC
  Administered 2019-12-26: 1 via TOPICAL

## 2019-12-26 MED ORDER — RADIAPLEXRX EX GEL: Freq: Once | CUTANEOUS | Status: AC

## 2019-12-29 ENCOUNTER — Ambulatory Visit
Admission: RE | Admit: 2019-12-29 | Discharge: 2019-12-29 | Disposition: A | Discharge status: Home / Self Care | Payer: Medicare Other | Source: Ambulatory Visit | Attending: Radiation Oncology | Admitting: Radiation Oncology

## 2019-12-29 ENCOUNTER — Ambulatory Visit: Payer: Medicare Other | Attending: General Surgery

## 2019-12-29 ENCOUNTER — Other Ambulatory Visit: Payer: Self-pay

## 2019-12-29 DIAGNOSIS — Z170 Estrogen receptor positive status [ER+]: Secondary | ICD-10-CM | POA: Insufficient documentation

## 2019-12-29 DIAGNOSIS — Z510 Encounter for antineoplastic radiation therapy: Secondary | ICD-10-CM | POA: Diagnosis not present

## 2019-12-29 DIAGNOSIS — Z483 Aftercare following surgery for neoplasm: Secondary | ICD-10-CM | POA: Insufficient documentation

## 2019-12-29 DIAGNOSIS — G8929 Other chronic pain: Secondary | ICD-10-CM

## 2019-12-29 DIAGNOSIS — R293 Abnormal posture: Secondary | ICD-10-CM | POA: Diagnosis not present

## 2019-12-29 DIAGNOSIS — C50411 Malignant neoplasm of upper-outer quadrant of right female breast: Secondary | ICD-10-CM | POA: Diagnosis not present

## 2019-12-29 DIAGNOSIS — M25511 Pain in right shoulder: Secondary | ICD-10-CM | POA: Insufficient documentation

## 2019-12-29 NOTE — Therapy (Signed)
[place], [place], [address] Phone: [phone]   Fax:  [phone]  Physical Therapy Treatment  Patient Details  Name: Michelle Sherman MRN: [id number] Date of Birth: Dec 07, 1950 Referring Provider (PT): Dr. [name]   Encounter Date: 12/29/2019   PT End of Session - 12/29/19 1605    Visit Number 2    Number of Visits 13    Date for PT Re-Evaluation 02/02/20    PT Start Time 1508    PT Stop Time 1602    PT Time Calculation (min) 54 min    Activity Tolerance Patient tolerated treatment well    Behavior During Therapy [facility] for tasks assessed/performed           Past Medical History:  Diagnosis Date  . Allergy   . Arthritis   . Breast cancer ([place]) 08/2019   right breast IDC  . Complication of anesthesia    unable to void after surgery and had to stay overnight   . Diabetes mellitus without complication ([place])   . Family history of adverse reaction to anesthesia    sister, hard to wake up   . Family history of lung cancer   . Family history of pancreatic cancer   . GERD (gastroesophageal reflux disease)   . Hypertension   . Sleep apnea    wears CPAP nightly  . Thyroid disease   . Ulcerative colitis [facility])     Past Surgical History:  Procedure Laterality Date  . BREAST LUMPECTOMY WITH RADIOACTIVE SEED AND SENTINEL LYMPH NODE BIOPSY Right 10/14/2019   Procedure: RIGHT BREAST LUMPECTOMY WITH RADIOACTIVE SEED AND SENTINEL LYMPH NODE BIOPSY;  Surgeon: [name], MD;  Location: [place];  Service: General;  Laterality: Right;  . CHOLECYSTECTOMY    . RE-EXCISION OF BREAST LUMPECTOMY Right 11/11/2019   Procedure: RIGHT RE-EXCISION OF BREAST LUMPECTOMY;  Surgeon: [name], MD;  Location: [place];  Service: General;  Laterality: Right;  RNFA  . SKIN SURGERY  12/2015   Fatty tumor removed  . TONSILLECTOMY AND ADENOIDECTOMY  1956    There were no  vitals filed for this visit.   Subjective Assessment - 12/29/19 1517    Subjective My Rt shoulder hasn't been feeling good in the radiation position but I can lift what I need to when my arms stay close to my sides.    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 She had a right lumpectomy and sentinel node biopsy (2 negative nodes) on 10/14/2019. It is ER/PR positive and HER2 negative with a Ki67 of 5%. She cares for her husband who is in a motorized wheelchair and has bilateral BKA.    Patient Stated Goals Help with right arm pain,    Currently in Pain? No/denies   no pain with arm at rest                            [facility] Adult PT Treatment/Exercise - 12/29/19 0001      Shoulder Exercises: Isometric Strengthening   Flexion 3X3"   standing in doorway and Rt UE for each   Extension 3X3"    External Rotation 3X3"    ABduction 3X3"      Modalities   Modalities Iontophoresis      Iontophoresis   Type of Iontophoresis Dexamethasone    Location Rt superior shoulder  Dose 4 mg/ml    Time 4-6 hrs      Manual Therapy   Manual Therapy Soft tissue mobilization;Passive ROM;Joint mobilization    Joint Mobilization Posterior and inferiorly to Rt shoulder    Soft tissue mobilization With coconut oil to anterior and superior Rt shoulder joint    Passive ROM In Supine to Rt shoulder into flexion, abduction, and D2 gently and to pts tolerance                  PT Education - 12/29/19 1602    Education Details Isometrics and precautions for iontophoresis    Person(s) Educated Patient    Methods Explanation;Demonstration;Handout    Comprehension Verbalized understanding;Returned demonstration;Need further instruction               PT Long Term Goals - 12/22/19 2147      PT LONG TERM GOAL #1   Title pt will be independent in HEP for right shoulder ROM/strenght    Time 4    Period Weeks     Status New    Target Date 01/19/20      PT LONG TERM GOAL #2   Title Pt will have decreased right shoulder pain by 50% or greater    Time 6    Period Weeks    Status New    Target Date 02/02/20      PT LONG TERM GOAL #3   Title Pt to demo improved strength of R shoulder, to at least 4+/5 to improve ability for reaching, lifting, and IADLS.     Time 6    Period Weeks    Status New    Target Date 02/02/20      PT LONG TERM GOAL #4   Title Pt will have AROM right shoulder WNL when compared with left    Baseline abd 85 A    Time 6    Period Weeks    Status New    Target Date 02/02/20      PT LONG TERM GOAL #5   Title Quick dash will be no greater than 10% to demonstrate improved right UE function    Time 6    Period Weeks    Status New      Additional Long Term Goals   Additional Long Term Goals Yes      PT LONG TERM GOAL #6   Title Pt will  be fit for prophylactic sleeve prn    Time 4    Period Weeks    Status New                 Plan - 12/29/19 1732    Clinical Impression Statement First session today of treatment for Rt shoulder pain and limited mobility. Pt tolerated STM well with some softening of tightness noted at superio/anterior Rt shoulder and upper arm by end of session. Also pt with min increase in P/ROM with less discomfort reported. First trial of iontophoresis to Rt shoulder. Pt was instructed in precautions and to remove in 4 hours. She can not wear patch during raidation Inez Catalina confirmed this today) so she can only have patch if after radiation treatment for the day. Pt may be willing to return briefly to have patch placed after radiation. Also progressed HEP to include isometrics for Rt shoulder which she was able to tolerate without increased pain and with correct technique.    Personal Factors and Comorbidities Comorbidity 1    Comorbidities right lumpectomy with  SLNB    Examination-Activity Limitations Caring for Others;Lift;Sleep;Reach  Overhead    Examination-Participation Restrictions Cleaning    Stability/Clinical Decision Making Stable/Uncomplicated    Rehab Potential Excellent    PT Frequency 2x / week    PT Duration 6 weeks    PT Treatment/Interventions ADLs/Self Care Home Management;Therapeutic exercise;Patient/family education;Iontophoresis 64m/ml Dexamethasone;Neuromuscular re-education;Manual techniques;Manual lymph drainage;Compression bandaging;Passive range of motion;Taping    PT Next Visit Plan Strengthening of right shoulder ( impingement), review HEP;  postural education, how was ionto? cont if pt can return after radiation, PROM prn, UT stretches, STW prn; Keep watch on right UE for swelling    PT Home Exercise Plan Post op shoulder ROM HEP; isometrics for Rt shoulder    Consulted and Agree with Plan of Care Patient           Patient will benefit from skilled therapeutic intervention in order to improve the following deficits and impairments:  Decreased knowledge of precautions, Pain, Postural dysfunction, Decreased range of motion, Impaired UE functional use, Decreased activity tolerance, Decreased strength  Visit Diagnosis: Chronic right shoulder pain  Malignant neoplasm of upper-outer quadrant of right breast in female, estrogen receptor positive ([facility])  Abnormal posture  Aftercare following surgery for neoplasm     Problem List Patient Active Problem List   Diagnosis Date Noted  . Genetic testing 10/06/2019  . Family history of pancreatic cancer   . Family history of lung cancer   . Malignant neoplasm of upper-outer quadrant of right breast in female, estrogen receptor positive ([place] 09/18/2019  . Incomplete uterovaginal prolapse 04/03/2019  . Need for shingles vaccine 06/27/2018  . 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 Plantar fasciitis 05/06/2017  . Controlled type 2 diabetes mellitus without complication, without long-term current use of insulin ([place] 11/01/2016  . Hypertension  associated with diabetes ([place] 11/01/2016  . Severe obesity (BMI 35.0-35.9 with comorbidity) ([place] 11/01/2016  . Combined hyperlipidemia associated with type 2 diabetes mellitus ([place] 01/15/2012  . Allergic rhinitis 10/05/2011  . Ulcerative colitis without complications ([place] 032/02/3341 . Diverticulosis of colon 10/17/2010  . Internal hemorrhoids 10/17/2010  . Acquired hypothyroidism 10/04/2010  . Osteoarthrosis of knee 10/04/2010  . OSA on CPAP 06/28/2010    ROtelia Limes PTA 12/29/2019, 5:37 PM  [place] [address]Phone: [phone]  Fax:  [phone] Name: KVANTASIA PINKNEYMRN: [id number] of Birth: 711/24/1952

## 2019-12-29 NOTE — Patient Instructions (Addendum)
Flexion (Isometric)      Cancer Rehab [phone]    Press right fist against wall. Hold __5__ seconds. Repeat _5-10___ times. Do __1-2__ sessions per day.  SHOULDER: Abduction (Isometric)    Use wall as resistance. Press arm against pillow. Hold _5__ seconds. _5-10__ times. Do _1-2__ sessions per day.    External Rotation (Isometric)    Place back of left fist against door frame, with elbow bent. Press fist against door frame. Hold __5__ seconds. Repeat _5-10___ times. Do _1-2___ sessions per day.  Extension (Isometric)    Place left bent elbow and back of arm against wall. Press elbow against wall. Hold __5__ seconds. Repeat _5-10___ times. Do _1-2___ sessions per day.    IONTOPHORESIS PATIENT PRECAUTIONS & CONTRAINDICATIONS:  . Redness under one or both electrodes can occur.  This characterized by a uniform redness that usually disappears within 12 hours of treatment. . Small pinhead size blisters may result in response to the drug.  Contact your physician if the problem persists more than 24 hours. . On rare occasions, iontophoresis therapy can result in temporary skin reactions such as rash, inflammation, irritation or burns.  The skin reactions may be the result of individual sensitivity to the ionic solution used, the condition of the skin at the start of treatment, reaction to the materials in the electrodes, allergies or sensitivity to dexamethasone, or a poor connection between the patch and your skin.  Discontinue using iontophoresis if you have any of these reactions and report to your therapist. . Remove the Patch or electrodes if you have any undue sensation of pain or burning during the treatment and report discomfort to your therapist. . Tell your Therapist if you have had known adverse reactions to the application of electrical current. . If using the Patch, the LED light will turn off when treatment is complete and the patch can be removed.  Approximate  treatment time is 1-3 hours.  Remove the patch when light goes off or after 6 hours. . The Patch can be worn during normal activity, however excessive motion where the electrodes have been placed can cause poor contact between the skin and the electrode or uneven electrical current resulting in greater risk of skin irritation. Marland Kitchen Keep out of the reach of children.   . DO NOT use if you have a cardiac pacemaker or any other electrically sensitive implanted device. . DO NOT use if you have a known sensitivity to dexamethasone. . DO NOT use during Magnetic Resonance Imaging (MRI). . DO NOT use over broken or compromised skin (e.g. sunburn, cuts, or acne) due to the increased risk of skin reaction. . DO NOT SHAVE over the area to be treated:  To establish good contact between the Patch and the skin, excessive hair may be clipped. . DO NOT place the Patch or electrodes on or over your eyes, directly over your heart, or brain. . DO NOT reuse the Patch or electrodes as this may cause burns to occur.

## 2019-12-30 ENCOUNTER — Ambulatory Visit
Admission: RE | Admit: 2019-12-30 | Discharge: 2019-12-30 | Disposition: A | Discharge status: Home / Self Care | Payer: Medicare Other | Source: Ambulatory Visit | Attending: Radiation Oncology | Admitting: Radiation Oncology

## 2019-12-30 ENCOUNTER — Other Ambulatory Visit: Payer: Self-pay

## 2019-12-30 DIAGNOSIS — C50411 Malignant neoplasm of upper-outer quadrant of right female breast: Secondary | ICD-10-CM | POA: Diagnosis not present

## 2019-12-30 DIAGNOSIS — Z510 Encounter for antineoplastic radiation therapy: Secondary | ICD-10-CM | POA: Diagnosis not present

## 2019-12-30 DIAGNOSIS — Z170 Estrogen receptor positive status [ER+]: Secondary | ICD-10-CM | POA: Diagnosis not present

## 2019-12-31 ENCOUNTER — Ambulatory Visit: Payer: Medicare Other

## 2019-12-31 ENCOUNTER — Other Ambulatory Visit: Payer: Self-pay

## 2019-12-31 ENCOUNTER — Ambulatory Visit
Admission: RE | Admit: 2019-12-31 | Discharge: 2019-12-31 | Disposition: A | Discharge status: Home / Self Care | Payer: Medicare Other | Source: Ambulatory Visit | Attending: Radiation Oncology | Admitting: Radiation Oncology

## 2019-12-31 DIAGNOSIS — C50411 Malignant neoplasm of upper-outer quadrant of right female breast: Secondary | ICD-10-CM

## 2019-12-31 DIAGNOSIS — Z510 Encounter for antineoplastic radiation therapy: Secondary | ICD-10-CM | POA: Diagnosis not present

## 2019-12-31 DIAGNOSIS — Z170 Estrogen receptor positive status [ER+]: Secondary | ICD-10-CM | POA: Diagnosis not present

## 2019-12-31 DIAGNOSIS — M25511 Pain in right shoulder: Secondary | ICD-10-CM

## 2019-12-31 DIAGNOSIS — R293 Abnormal posture: Secondary | ICD-10-CM

## 2019-12-31 DIAGNOSIS — G8929 Other chronic pain: Secondary | ICD-10-CM | POA: Diagnosis not present

## 2019-12-31 DIAGNOSIS — Z483 Aftercare following surgery for neoplasm: Secondary | ICD-10-CM

## 2019-12-31 NOTE — Patient Instructions (Addendum)
Lay over rolled towel along thoracic spine (between shoulder blades) on bed for following:  1. Make a snow angel leaving your arms on the bed until gentle stretch/mild discomfort at Rt shoulder. Hold 1-2 mins and if discomfort/stretch lessens then try to slide arms further along bed.   2. Start with hands together pointed towards ceiling, then open arms wide towards bed and then bring them back together.  3. Start with hands together near waist making the bottom of the "V" and then make the legs of the "V", then retrace "V".  All within pain tolerance and 5-10 reps at a time. Can do 1-3x/day.   Incorporate deep breathing throughout.    Modified downward dog on wall 5x , 5 sec holds  1-2x/day  Cancer Rehab [phone]

## 2019-12-31 NOTE — Therapy (Signed)
[place], [place], [address] Phone: [phone]   Fax:  [phone]  Physical Therapy Treatment  Patient Details  Name: Michelle Sherman MRN: [id number] Date of Birth: Jul 25, 1950 Referring Provider (PT): Dr. [name]   Encounter Date: 12/31/2019   PT End of Session - 12/31/19 1213    Visit Number 3    Number of Visits 13    Date for PT Re-Evaluation 02/02/20    PT Start Time 1107    PT Stop Time 1205    PT Time Calculation (min) 58 min    Activity Tolerance Patient tolerated treatment well    Behavior During Therapy [facility] for tasks assessed/performed           Past Medical History:  Diagnosis Date  . Allergy   . Arthritis   . Breast cancer ([facility]) 08/2019   right breast IDC  . Complication of anesthesia    unable to void after surgery and had to stay overnight   . Diabetes mellitus without complication ([place])   . Family history of adverse reaction to anesthesia    sister, hard to wake up   . Family history of lung cancer   . Family history of pancreatic cancer   . GERD (gastroesophageal reflux disease)   . Hypertension   . Sleep apnea    wears CPAP nightly  . Thyroid disease   . Ulcerative colitis [facility])     Past Surgical History:  Procedure Laterality Date  . BREAST LUMPECTOMY WITH RADIOACTIVE SEED AND SENTINEL LYMPH NODE BIOPSY Right 10/14/2019   Procedure: RIGHT BREAST LUMPECTOMY WITH RADIOACTIVE SEED AND SENTINEL LYMPH NODE BIOPSY;  Surgeon: [name], MD;  Location: [place];  Service: General;  Laterality: Right;  . CHOLECYSTECTOMY    . RE-EXCISION OF BREAST LUMPECTOMY Right 11/11/2019   Procedure: RIGHT RE-EXCISION OF BREAST LUMPECTOMY;  Surgeon: [name], MD;  Location: [place];  Service: General;  Laterality: Right;  RNFA  . SKIN SURGERY  12/2015   Fatty tumor removed  . TONSILLECTOMY AND ADENOIDECTOMY  1956    There were no  vitals filed for this visit.   Subjective Assessment - 12/31/19 1113    Subjective The ionto patch was fine except when I took it off the medicine area was white and raised and I was red and a little itchy where the adhesive was. I don't have an allergy to adhesive so I'm not sure why it did that. The white, raised area went away yesterday and it really didn't bother me I just noticed it. It felt fine while it was on but I couldn't tell much a difference while I was wearing it.    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 She had a right lumpectomy and sentinel node biopsy (2 negative nodes) on 10/14/2019. It is ER/PR positive and HER2 negative with a Ki67 of 5%. She cares for her husband who is in a motorized wheelchair and has bilateral BKA.    Patient Stated Goals Help with right arm pain,                             [facility] Adult PT Treatment/Exercise - 12/31/19 0001      Shoulder Exercises: Supine   Other Supine Exercises Supine over towel roll alotn thoracic spine for bil UE's horizontal abduction and then bil  scaption into a "V" x10 each returning therapist demo, also instructed pt in snow angel for bil abduction on her bed at home      Shoulder Exercises: Pulleys   Flexion 2 minutes    Flexion Limitations Pt returned therapist demo    ABduction 2 minutes    ABduction Limitations Pt returned therapist demo and VCs to relax Rt shoulder throughout      Shoulder Exercises: Stretch   Other Shoulder Stretches Modified downward dog on wall 5x, 5 sec holds returning therapist demo      Manual Therapy   Joint Mobilization Posterior and inferiorly to Rt shoulder    Soft tissue mobilization To Rt anterior and superior shoulder joint during P/ROM    Passive ROM In Supine to Rt shoulder into flexion, abduction, and IR/er gently and to pts tolerance which was improved today                  PT  Education - 12/31/19 1159    Education Details Supine over towel roll for bil UE A/ROM and modified downward dog on wall    Person(s) Educated Patient    Methods Explanation;Demonstration;Handout    Comprehension Verbalized understanding;Returned demonstration               PT Long Term Goals - 12/22/19 2147      PT LONG TERM GOAL #1   Title pt will be independent in HEP for right shoulder ROM/strenght    Time 4    Period Weeks    Status New    Target Date 01/19/20      PT LONG TERM GOAL #2   Title Pt will have decreased right shoulder pain by 50% or greater    Time 6    Period Weeks    Status New    Target Date 02/02/20      PT LONG TERM GOAL #3   Title Pt to demo improved strength of R shoulder, to at least 4+/5 to improve ability for reaching, lifting, and IADLS.     Time 6    Period Weeks    Status New    Target Date 02/02/20      PT LONG TERM GOAL #4   Title Pt will have AROM right shoulder WNL when compared with left    Baseline abd 85 A    Time 6    Period Weeks    Status New    Target Date 02/02/20      PT LONG TERM GOAL #5   Title Quick dash will be no greater than 10% to demonstrate improved right UE function    Time 6    Period Weeks    Status New      Additional Long Term Goals   Additional Long Term Goals Yes      PT LONG TERM GOAL #6   Title Pt will  be fit for prophylactic sleeve prn    Time 4    Period Weeks    Status New                 Plan - 12/31/19 1214    Clinical Impression Statement Continued with treatment of Rt shoulder decreased ROM and increased pain. Pt has improved P/ROM with stretching today and less discomfort reported. Also less tenderness at Rt anterior shoulder with STM. Progressed HEP to include painfree A/ROM stretching which she tolerated well. By end of session pt reports her Rt shoulder discomfort with A/ROM  is much reduced and her motion fees less restricted. Encouraged her to to work on incorporating new  stretches issued today and isometrics into her ADLs over the next few days and into the weekend. She verbalized understanding.    Personal Factors and Comorbidities Comorbidity 1    Comorbidities right lumpectomy with SLNB    Examination-Activity Limitations Caring for Others;Lift;Sleep;Reach Overhead    Examination-Participation Restrictions Cleaning    Stability/Clinical Decision Making Stable/Uncomplicated    Rehab Potential Excellent    PT Frequency 2x / week    PT Duration 6 weeks    PT Treatment/Interventions ADLs/Self Care Home Management;Therapeutic exercise;Patient/family education;Iontophoresis 41m/ml Dexamethasone;Neuromuscular re-education;Manual techniques;Manual lymph drainage;Compression bandaging;Passive range of motion;Taping    PT Next Visit Plan Cont strengthening of right shoulder ( impingement), review HEP;  postural education, cont ionto if pt comes after having radiation for the day; PROM, UT stretches, STW prn; Keep watch on right UE for swelling    PT Home Exercise Plan Post op shoulder ROM HEP; isometrics for Rt shoulder; supine A/ROM for Rt shoulder over towel roll and modified downward dog    Consulted and Agree with Plan of Care Patient           Patient will benefit from skilled therapeutic intervention in order to improve the following deficits and impairments:  Decreased knowledge of precautions, Pain, Postural dysfunction, Decreased range of motion, Impaired UE functional use, Decreased activity tolerance, Decreased strength  Visit Diagnosis: Chronic right shoulder pain  Malignant neoplasm of upper-outer quadrant of right breast in female, estrogen receptor positive (HCC)  Abnormal posture  Aftercare following surgery for neoplasm     Problem List Patient Active Problem List   Diagnosis Date Noted  . Genetic testing 10/06/2019  . Family history of pancreatic cancer   . Family history of lung cancer   . Malignant neoplasm of upper-outer quadrant  of right breast in female, estrogen receptor positive ([place] 09/18/2019  . Incomplete uterovaginal prolapse 04/03/2019  . Need for shingles vaccine 06/27/2018  . 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 Plantar fasciitis 05/06/2017  . Controlled type 2 diabetes mellitus without complication, without long-term current use of insulin ([facility] 11/01/2016  . Hypertension associated with diabetes ([facility] 11/01/2016  . Severe obesity (BMI 35.0-35.9 with comorbidity) ([facility] 11/01/2016  . Combined hyperlipidemia associated with type 2 diabetes mellitus ([facility] 01/15/2012  . Allergic rhinitis 10/05/2011  . Ulcerative colitis without complications ([facility] 046/65/9935 . Diverticulosis of colon 10/17/2010  . Internal hemorrhoids 10/17/2010  . Acquired hypothyroidism 10/04/2010  . Osteoarthrosis of knee 10/04/2010  . OSA on CPAP 06/28/2010    ROtelia Limes PTA 12/31/2019, 12:23 PM  [place] [address]Phone: [phone]  Fax:  [phone] Name: Michelle FULLMRN: [id number] of Birth: 7August 24, 1952

## 2020-01-01 ENCOUNTER — Ambulatory Visit
Admission: RE | Admit: 2020-01-01 | Discharge: 2020-01-01 | Disposition: A | Discharge status: Home / Self Care | Payer: Medicare Other | Source: Ambulatory Visit | Attending: Radiation Oncology | Admitting: Radiation Oncology

## 2020-01-01 DIAGNOSIS — Z170 Estrogen receptor positive status [ER+]: Secondary | ICD-10-CM | POA: Diagnosis not present

## 2020-01-01 DIAGNOSIS — Z510 Encounter for antineoplastic radiation therapy: Secondary | ICD-10-CM | POA: Diagnosis not present

## 2020-01-01 DIAGNOSIS — C50411 Malignant neoplasm of upper-outer quadrant of right female breast: Secondary | ICD-10-CM | POA: Diagnosis not present

## 2020-01-02 ENCOUNTER — Other Ambulatory Visit: Payer: Self-pay

## 2020-01-02 ENCOUNTER — Ambulatory Visit
Admission: RE | Admit: 2020-01-02 | Discharge: 2020-01-02 | Disposition: A | Discharge status: Home / Self Care | Payer: Medicare Other | Source: Ambulatory Visit | Attending: Radiation Oncology | Admitting: Radiation Oncology

## 2020-01-02 DIAGNOSIS — C50411 Malignant neoplasm of upper-outer quadrant of right female breast: Secondary | ICD-10-CM | POA: Diagnosis not present

## 2020-01-02 DIAGNOSIS — Z170 Estrogen receptor positive status [ER+]: Secondary | ICD-10-CM | POA: Diagnosis not present

## 2020-01-02 DIAGNOSIS — Z510 Encounter for antineoplastic radiation therapy: Secondary | ICD-10-CM | POA: Diagnosis not present

## 2020-01-03 DIAGNOSIS — Z170 Estrogen receptor positive status [ER+]: Secondary | ICD-10-CM | POA: Diagnosis not present

## 2020-01-03 DIAGNOSIS — Z510 Encounter for antineoplastic radiation therapy: Secondary | ICD-10-CM | POA: Diagnosis not present

## 2020-01-03 DIAGNOSIS — C50411 Malignant neoplasm of upper-outer quadrant of right female breast: Secondary | ICD-10-CM | POA: Diagnosis not present

## 2020-01-05 ENCOUNTER — Ambulatory Visit: Payer: Medicare Other

## 2020-01-05 ENCOUNTER — Ambulatory Visit
Admission: RE | Admit: 2020-01-05 | Discharge: 2020-01-05 | Disposition: A | Discharge status: Home / Self Care | Payer: Medicare Other | Source: Ambulatory Visit | Attending: Radiation Oncology | Admitting: Radiation Oncology

## 2020-01-05 ENCOUNTER — Other Ambulatory Visit: Payer: Self-pay

## 2020-01-05 DIAGNOSIS — C50411 Malignant neoplasm of upper-outer quadrant of right female breast: Secondary | ICD-10-CM | POA: Diagnosis not present

## 2020-01-05 DIAGNOSIS — G8929 Other chronic pain: Secondary | ICD-10-CM | POA: Diagnosis not present

## 2020-01-05 DIAGNOSIS — M25511 Pain in right shoulder: Secondary | ICD-10-CM | POA: Diagnosis not present

## 2020-01-05 DIAGNOSIS — Z170 Estrogen receptor positive status [ER+]: Secondary | ICD-10-CM | POA: Diagnosis not present

## 2020-01-05 DIAGNOSIS — Z510 Encounter for antineoplastic radiation therapy: Secondary | ICD-10-CM | POA: Diagnosis not present

## 2020-01-05 DIAGNOSIS — R293 Abnormal posture: Secondary | ICD-10-CM | POA: Diagnosis not present

## 2020-01-05 DIAGNOSIS — Z483 Aftercare following surgery for neoplasm: Secondary | ICD-10-CM | POA: Diagnosis not present

## 2020-01-05 NOTE — Therapy (Signed)
[place], [place], [address] Phone: [phone]   Fax:  [phone]  Physical Therapy Treatment  Patient Details  Name: Michelle Sherman MRN: [id number] Date of Birth: Jan 27, 1950 Referring Provider (PT): Dr. [name]   Encounter Date: 01/05/2020   PT End of Session - 01/05/20 1602    Visit Number 4    Number of Visits 13    Date for PT Re-Evaluation 02/02/20    PT Start Time 4166    PT Stop Time 1554    PT Time Calculation (min) 48 min    Activity Tolerance Patient tolerated treatment well    Behavior During Therapy [facility] for tasks assessed/performed           Past Medical History:  Diagnosis Date  . Allergy   . Arthritis   . Breast cancer ([place]) 08/2019   right breast IDC  . Complication of anesthesia    unable to void after surgery and had to stay overnight   . Diabetes mellitus without complication ([place])   . Family history of adverse reaction to anesthesia    sister, hard to wake up   . Family history of lung cancer   . Family history of pancreatic cancer   . GERD (gastroesophageal reflux disease)   . Hypertension   . Sleep apnea    wears CPAP nightly  . Thyroid disease   . Ulcerative colitis [facility])     Past Surgical History:  Procedure Laterality Date  . BREAST LUMPECTOMY WITH RADIOACTIVE SEED AND SENTINEL LYMPH NODE BIOPSY Right 10/14/2019   Procedure: RIGHT BREAST LUMPECTOMY WITH RADIOACTIVE SEED AND SENTINEL LYMPH NODE BIOPSY;  Surgeon: [name], MD;  Location: [place];  Service: General;  Laterality: Right;  . CHOLECYSTECTOMY    . RE-EXCISION OF BREAST LUMPECTOMY Right 11/11/2019   Procedure: RIGHT RE-EXCISION OF BREAST LUMPECTOMY;  Surgeon: [name], MD;  Location: [place];  Service: General;  Laterality: Right;  RNFA  . SKIN SURGERY  12/2015   Fatty tumor removed  . TONSILLECTOMY AND ADENOIDECTOMY  1956    There were no  vitals filed for this visit.   Subjective Assessment - 01/05/20 1508    Subjective My shoulder didn't hurt today with the radiation but it feels a little twingy now.  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 She had a right lumpectomy and sentinel node biopsy (2 negative nodes) on 10/14/2019. It is ER/PR positive and HER2 negative with a Ki67 of 5%. She cares for her husband who is in a motorized wheelchair and has bilateral BKA.    Patient Stated Goals Help with right arm pain,    Currently in Pain? Yes    Pain Score 1     Pain Orientation Right    Pain Onset More than a month ago    Pain Frequency Intermittent                             [facility] Adult PT Treatment/Exercise - 01/05/20 0001      Shoulder Exercises: Standing   Extension Strengthening;Left;10 reps    Theraband Level (Shoulder Extension) Level 1 (Yellow)    Retraction Strengthening;Both;10 reps    Theraband Level (Shoulder Retraction) Level 1 (Yellow)  Shoulder Exercises: Pulleys   Flexion 2 minutes    Flexion Limitations occasional pop but no pain    ABduction 2 minutes    ABduction Limitations occasional pop but no pain   VC's to depress scapula     Shoulder Exercises: Stretch   Other Shoulder Stretches Modified downward dog on wall 5x, 5 sec holds returning therapist demo      Iontophoresis   Type of Iontophoresis Dexamethasone    Location Right prox. biceps    Dose 4 mg/ml    Time 4 hours      Manual Therapy   Joint Mobilization Posterior and inferiorly to Rt shoulder    Soft tissue mobilization To Rt anterior and superior shoulder joint during P/ROM    Passive ROM In Supine to Rt shoulder into flexion, abduction, and IR/er gently and to pts tolerance which was improved today                  PT Education - 01/05/20 1555    Education  Details Pt educated in scapular retraction and shoulder extension with yellow Theraband.  Pt was given illustrated and written instructions.  Also discussed Ionto and advised to remove patch if any itching or other discomfort occurs    Person(s) Educated Patient    Methods Explanation;Demonstration;Handout    Comprehension Verbalized understanding;Returned demonstration               PT Long Term Goals - 12/22/19 2147      PT LONG TERM GOAL #1   Title pt will be independent in HEP for right shoulder ROM/strenght    Time 4    Period Weeks    Status New    Target Date 01/19/20      PT LONG TERM GOAL #2   Title Pt will have decreased right shoulder pain by 50% or greater    Time 6    Period Weeks    Status New    Target Date 02/02/20      PT LONG TERM GOAL #3   Title Pt to demo improved strength of R shoulder, to at least 4+/5 to improve ability for reaching, lifting, and IADLS.     Time 6    Period Weeks    Status New    Target Date 02/02/20      PT LONG TERM GOAL #4   Title Pt will have AROM right shoulder WNL when compared with left    Baseline abd 85 A    Time 6    Period Weeks    Status New    Target Date 02/02/20      PT LONG TERM GOAL #5   Title Quick dash will be no greater than 10% to demonstrate improved right UE function    Time 6    Period Weeks    Status New      Additional Long Term Goals   Additional Long Term Goals Yes      PT LONG TERM GOAL #6   Title Pt will  be fit for prophylactic sleeve prn    Time 4    Period Weeks    Status New                 Plan - 01/05/20 1603    Clinical Impression Statement Pt. continues to note less discomfort in the anterior shoulder, though still has some tenderness at right proximal biceps and supraspinatus insertion.  She had good tolerance for theraband exercises  today and was given illustrations for HEP.  Pt had tried ice on her shoulder at home and got good benefit, but was advised to use it  sparingly and very mild coldness secondary to Lymphedema risk.  Pt was also advised to remove Ionto patch if having any itching or discomfort.    Personal Factors and Comorbidities Comorbidity 1    Comorbidities right lumpectomy with SLNB    Examination-Activity Limitations Caring for Others;Lift;Sleep;Reach Overhead    Examination-Participation Restrictions Cleaning    Stability/Clinical Decision Making Stable/Uncomplicated    Clinical Decision Making Low    Rehab Potential Excellent    PT Frequency 2x / week    PT Duration 6 weeks    PT Treatment/Interventions ADLs/Self Care Home Management;Therapeutic exercise;Patient/family education;Iontophoresis 34m/ml Dexamethasone;Neuromuscular re-education;Manual techniques;Manual lymph drainage;Compression bandaging;Passive range of motion;Taping    PT Next Visit Plan Cont strengthening of right shoulder ( impingement), review HEP;  postural education, cont ionto if pt comes after having radiation for the day; PROM, UT stretches, STW prn; Keep watch on right UE for swelling    PT Home Exercise Plan Post op shoulder ROM HEP; isometrics for Rt shoulder; supine A/ROM for Rt shoulder over towel roll and modified downward dog, Scapular retraction, shoulder extension with yellow TBand    Consulted and Agree with Plan of Care Patient           Patient will benefit from skilled therapeutic intervention in order to improve the following deficits and impairments:  Decreased knowledge of precautions,Pain,Postural dysfunction,Decreased range of motion,Impaired UE functional use,Decreased activity tolerance,Decreased strength  Visit Diagnosis: Chronic right shoulder pain  Malignant neoplasm of upper-outer quadrant of right breast in female, estrogen receptor positive (HCC)  Abnormal posture  Aftercare following surgery for neoplasm     Problem List Patient Active Problem List   Diagnosis Date Noted  . Genetic testing 10/06/2019  . Family history of  pancreatic cancer   . Family history of lung cancer   . Malignant neoplasm of upper-outer quadrant of right breast in female, estrogen receptor positive ([place] 09/18/2019  . Incomplete uterovaginal prolapse 04/03/2019  . Need for shingles vaccine 06/27/2018  . 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 Plantar fasciitis 05/06/2017  . Controlled type 2 diabetes mellitus without complication, without long-term current use of insulin ([facility] 11/01/2016  . Hypertension associated with diabetes ([facility] [place] 11/01/2016  . Severe obesity (BMI 35.0-35.9 with comorbidity) ([facility] 11/01/2016  . Combined hyperlipidemia associated with type 2 diabetes mellitus ([facility] 01/15/2012  . Allergic rhinitis 10/05/2011  . Ulcerative colitis without complications ([facility] 093/11/2160 . Diverticulosis of colon 10/17/2010  . Internal hemorrhoids 10/17/2010  . Acquired hypothyroidism 10/04/2010  . Osteoarthrosis of knee 10/04/2010  . OSA on CPAP 06/28/2010    RClaris Pong12/13/2021, 4:09 PM  [place] [address]Phone: [phone]  Fax:  [phone] Name: KQUANIYA DAMASMRN: [id number] of Birth: [birth year] RCheral Almas PT 01/05/20 4:10 PM

## 2020-01-05 NOTE — Patient Instructions (Signed)
Access Code: [email or web address] URL: [email or web address] Date: 01/05/2020 Prepared by: Cheral Almas  Exercises Scapular Retraction with Resistance - 1 x daily - 7 x weekly - 1 sets - 10 reps Scapular Retraction with Resistance Advanced - 1 x daily - 7 x weekly - 1 sets - 10 reps

## 2020-01-06 ENCOUNTER — Ambulatory Visit
Admission: RE | Admit: 2020-01-06 | Discharge: 2020-01-06 | Disposition: A | Discharge status: Home / Self Care | Payer: Medicare Other | Source: Ambulatory Visit | Attending: Radiation Oncology | Admitting: Radiation Oncology

## 2020-01-06 DIAGNOSIS — Z510 Encounter for antineoplastic radiation therapy: Secondary | ICD-10-CM | POA: Diagnosis not present

## 2020-01-06 DIAGNOSIS — Z170 Estrogen receptor positive status [ER+]: Secondary | ICD-10-CM | POA: Diagnosis not present

## 2020-01-06 DIAGNOSIS — C50411 Malignant neoplasm of upper-outer quadrant of right female breast: Secondary | ICD-10-CM | POA: Diagnosis not present

## 2020-01-07 ENCOUNTER — Other Ambulatory Visit: Payer: Self-pay

## 2020-01-07 ENCOUNTER — Ambulatory Visit: Payer: Medicare Other | Admitting: Radiation Oncology

## 2020-01-07 ENCOUNTER — Ambulatory Visit (INDEPENDENT_AMBULATORY_CARE_PROVIDER_SITE_OTHER): Payer: Medicare Other | Admitting: Family Medicine

## 2020-01-07 ENCOUNTER — Telehealth: Payer: Self-pay

## 2020-01-07 ENCOUNTER — Encounter: Payer: Self-pay | Admitting: Family Medicine

## 2020-01-07 ENCOUNTER — Ambulatory Visit
Admission: RE | Admit: 2020-01-07 | Discharge: 2020-01-07 | Disposition: A | Discharge status: Home / Self Care | Payer: Medicare Other | Source: Ambulatory Visit | Attending: Radiation Oncology | Admitting: Radiation Oncology

## 2020-01-07 VITALS — BP 112/74 | HR 86 | Temp 97.2°F | Ht 60.0 in | Wt 194.8 lb

## 2020-01-07 DIAGNOSIS — Z0000 Encounter for general adult medical examination without abnormal findings: Secondary | ICD-10-CM

## 2020-01-07 DIAGNOSIS — E039 Hypothyroidism, unspecified: Secondary | ICD-10-CM

## 2020-01-07 DIAGNOSIS — E119 Type 2 diabetes mellitus without complications: Secondary | ICD-10-CM | POA: Diagnosis not present

## 2020-01-07 DIAGNOSIS — Z9989 Dependence on other enabling machines and devices: Secondary | ICD-10-CM

## 2020-01-07 DIAGNOSIS — K5190 Ulcerative colitis, unspecified, without complications: Secondary | ICD-10-CM | POA: Diagnosis not present

## 2020-01-07 DIAGNOSIS — Z170 Estrogen receptor positive status [ER+]: Secondary | ICD-10-CM

## 2020-01-07 DIAGNOSIS — G4733 Obstructive sleep apnea (adult) (pediatric): Secondary | ICD-10-CM

## 2020-01-07 DIAGNOSIS — E782 Mixed hyperlipidemia: Secondary | ICD-10-CM | POA: Diagnosis not present

## 2020-01-07 DIAGNOSIS — E1169 Type 2 diabetes mellitus with other specified complication: Secondary | ICD-10-CM | POA: Diagnosis not present

## 2020-01-07 DIAGNOSIS — N812 Incomplete uterovaginal prolapse: Secondary | ICD-10-CM

## 2020-01-07 DIAGNOSIS — E1159 Type 2 diabetes mellitus with other circulatory complications: Secondary | ICD-10-CM | POA: Diagnosis not present

## 2020-01-07 DIAGNOSIS — Z510 Encounter for antineoplastic radiation therapy: Secondary | ICD-10-CM | POA: Diagnosis not present

## 2020-01-07 DIAGNOSIS — I152 Hypertension secondary to endocrine disorders: Secondary | ICD-10-CM

## 2020-01-07 DIAGNOSIS — C50411 Malignant neoplasm of upper-outer quadrant of right female breast: Secondary | ICD-10-CM | POA: Diagnosis not present

## 2020-01-07 DIAGNOSIS — Z6835 Body mass index (BMI) 35.0-35.9, adult: Secondary | ICD-10-CM

## 2020-01-07 LAB — POCT GLYCOSYLATED HEMOGLOBIN (HGB A1C): Hemoglobin A1C: 6.4 % — AB (ref 4.0–5.6)

## 2020-01-07 MED ORDER — DAPAGLIFLOZIN PROPANEDIOL 10 MG PO TABS
10.0000 mg | ORAL_TABLET | Freq: Every day | ORAL | 3 refills | Status: DC
Start: 2020-01-07 — End: 2020-01-07

## 2020-01-07 MED ORDER — DAPAGLIFLOZIN PROPANEDIOL 10 MG PO TABS
10.0000 mg | ORAL_TABLET | Freq: Every day | ORAL | 3 refills | Status: DC
Start: 2020-01-07 — End: 2021-01-10

## 2020-01-07 MED ORDER — DAPAGLIFLOZIN PROPANEDIOL 10 MG PO TABS: 10.0000 mg | ORAL_TABLET | Freq: Every day | ORAL | 3 refills | Status: DC

## 2020-01-07 NOTE — Progress Notes (Signed)
Subjective Nightly Chief Complaint  Patient presents with  . Annual Exam    fasting  . Diabetes    AM blood sugar checks at home, 120-130's     HPI: Michelle Sherman is a 69 y.o. female who presents to [facility] today for a Female Wellness Visit. She also has the concerns and/or needs as listed above in the chief complaint. These will be addressed in addition to the Health Maintenance Visit.   Wellness Visit: annual visit with health maintenance review and exam without Pap   HM: imms and screens are up to date. Feeling well.  Chronic disease f/u and/or acute problem visit: (deemed necessary to be done in addition to the wellness visit):  Diabetes follow up: started bcise in march 2021; did well until she started experiencing significant nausea with vomiting.  Her follow-up with Dr. Collene Mares for ulcerative colitis decided that it was related to that medication.  Since stopping it, she has done well. Her diabetic control is reported as Unchanged.  She has been on Invokana for multiple years.  Has not tried another SGLT2 inhibitor.  She takes Metformin without GI effects. She denies exertional CP or SOB or symptomatic hypoglycemia. She denies foot sores or paresthesias.  She is undergoing treatment for breast cancer.  She has had 2 surgeries and now is undergoing radiation.  It is estrogen receptor positive.  I reviewed multiple oncology and surgical visits.  Fortunately she is doing well overall.  Vaginal prolapse: She did see her gynecologist who recommended topical estrogens.  It is stable.  Not worsening.  Not painful but remains present.  Hypertension, hyperlipidemia and hypothyroidism all been well controlled.  Due for recheck.  No symptoms of high or low thyroid.  No chest pain or shortness of breath.  She continues to take her statin without side effects.  Lab work due today.  She is fasting.  Ulcerative colitis is stable per GI.   Immunization History   Administered Date(s) Administered  . Fluad Quad(high Dose 65+) 12/15/2019  . Influenza, High Dose Seasonal PF 12/06/2015, 11/01/2016, 09/21/2017, 10/26/2018  . Moderna Sars-Covid-2 Vaccination 04/24/2019, 05/21/2019  . Pneumococcal Conjugate-13 10/22/2014  . Pneumococcal Polysaccharide-23 10/05/2011, 12/21/2016  . Tdap 10/04/2010  . Zoster 10/03/2009  . Zoster Recombinat (Shingrix) 01/02/2019, 03/25/2019    Diabetes Related Lab Review: Lab Results  Component Value Date   HGBA1C 6.4 (A) 01/07/2020   HGBA1C 6.4 (A) 07/10/2019   HGBA1C 7.0 (A) 04/03/2019    Lab Results  Component Value Date   MICROALBUR <0.7 01/01/2019   Lab Results  Component Value Date   CREATININE 0.68 11/07/2019   BUN 14 11/07/2019   NA 138 11/07/2019   K 4.4 11/07/2019   CL 101 11/07/2019   CO2 28 11/07/2019   Lab Results  Component Value Date   CHOL 153 01/01/2019   CHOL 146 12/24/2017   CHOL 144 11/01/2016   Lab Results  Component Value Date   HDL 64.60 01/01/2019   HDL 75.80 12/24/2017   HDL 65.50 11/01/2016   Lab Results  Component Value Date   LDLCALC 55 01/01/2019   LDLCALC 53 12/24/2017   LDLCALC 47 11/01/2016   Lab Results  Component Value Date   TRIG 169.0 (H) 01/01/2019   TRIG 88.0 12/24/2017   TRIG 158.0 (H) 11/01/2016   Lab Results  Component Value Date   CHOLHDL 2 01/01/2019   CHOLHDL 2 12/24/2017   CHOLHDL 2 11/01/2016   No  results found for: LDLDIRECT The 10-year ASCVD risk score Mikey Bussing DC Brooke Bonito., et al., 2013) is: 16.3%   Values used to calculate the score:     Age: 39 years     Sex: Female     Is Non-Hispanic African American: No     Diabetic: Yes     Tobacco smoker: No     Systolic Blood Pressure: 280 mmHg     Is BP treated: Yes     HDL Cholesterol: 64.6 mg/dL     Total Cholesterol: 153 mg/dL  BP Readings from Last 3 Encounters:  01/07/20 112/74  12/11/19 (!) 115/58  11/11/19 129/63   Wt Readings from Last 3 Encounters:  01/07/20 194 lb 12.8 oz (88.4  kg)  12/11/19 197 lb 9.6 oz (89.6 kg)  11/11/19 197 lb 15.6 oz (89.8 kg)    Health Maintenance  Topic Date Due  . COVID-19 Vaccine (3 - Moderna risk 4-dose series) 01/23/2020 (Originally 06/18/2019)  . HEMOGLOBIN A1C  07/07/2020  . FOOT EXAM  07/09/2020  . DEXA SCAN  08/07/2020  . MAMMOGRAM  08/21/2020  . TETANUS/TDAP  10/03/2020  . OPHTHALMOLOGY EXAM  12/07/2020  . COLONOSCOPY  04/01/2026  . INFLUENZA VACCINE  Completed  . Hepatitis C Screening  Completed  . PNA vac Low Risk Adult  Completed     Assessment  1. Annual physical exam   2. Controlled type 2 diabetes mellitus without complication, without long-term current use of insulin ([place])   3. Hypertension associated with diabetes ([place])   4. Severe obesity (BMI 35.0-35.9 with comorbidity) ([place])   5. Combined hyperlipidemia associated with type 2 diabetes mellitus ([place])   6. Ulcerative colitis without complications, unspecified location ([place])   7. Acquired hypothyroidism   8. OSA on CPAP   9. Malignant neoplasm of upper-outer quadrant of right breast in female, estrogen receptor positive ([place])   10. Incomplete uterovaginal prolapse      Plan  Female Wellness Visit:  Age appropriate Health Maintenance and Prevention measures were discussed with patient. Included topics are cancer screening recommendations, ways to keep healthy (see AVS) including dietary and exercise recommendations, regular eye and dental care, use of seat belts, and avoidance of moderate alcohol use and tobacco use.  Screens are up-to-date  BMI: discussed patient's BMI and encouraged positive lifestyle modifications to help get to or maintain a target BMI.  HM needs and immunizations were addressed and ordered. See below for orders. See HM and immunization section for updates.  Routine labs and screening tests ordered including cmp, cbc and lipids where appropriate.  Discussed recommendations regarding Vit D and calcium supplementation (see  AVS)  Chronic disease management visit and/or acute problem visit:  Continue breast cancer treatment.  Topical estrogens intermittently should be fine for her vaginal prolapse if becomes symptomatic.  She will follow-up with oncology to make sure they agree.  Hypertension is well controlled  Diabetic control is good.  Continue current medications.  We will try to get her approved for Farxiga due to CHF and renal benefits.  Ulcerative colitis per GI  Hyperlipidemia on statin that has been well controlled.  Recheck lipids today.  Recheck liver tests.   Follow up: 6 months for follow-up diabetes Orders Placed This Encounter  Procedures  . CBC with Differential/Platelet  . COMPLETE METABOLIC PANEL WITH GFR  . Lipid panel  . TSH  . POCT HgB A1C   Meds ordered this encounter  Medications  . DISCONTD: dapagliflozin propanediol (FARXIGA) 10 MG TABS  tablet    Sig: Take 1 tablet (10 mg total) by mouth daily.    Dispense:  90 tablet    Refill:  3  . dapagliflozin propanediol (FARXIGA) 10 MG TABS tablet    Sig: Take 1 tablet (10 mg total) by mouth daily.    Dispense:  90 tablet    Refill:  3      Lifestyle: Body mass index is 38.04 kg/m. Wt Readings from Last 3 Encounters:  01/07/20 194 lb 12.8 oz (88.4 kg)  12/11/19 197 lb 9.6 oz (89.6 kg)  11/11/19 197 lb 15.6 oz (89.8 kg)   Patient Active Problem List   Diagnosis Date Noted  . Controlled type 2 diabetes mellitus without complication, without long-term current use of insulin ([place]) 11/01/2016    Priority: High    Overview:  Declined nutrition consult - due to lack of insurance coverage for this service 01/2013   . Hypertension associated with diabetes ([place]) 11/01/2016    Priority: High  . Severe obesity (BMI 35.0-35.9 with comorbidity) ([place]) 11/01/2016    Priority: High  . Combined hyperlipidemia associated with type 2 diabetes mellitus ([place]) 01/15/2012    Priority: High    Overview:  Goal LDL < 100   . Ulcerative  colitis without complications ([place]) 43/15/4008    Priority: High    Overview:  No NSAIDS   . Acquired hypothyroidism 10/04/2010    Priority: High  . OSA on CPAP 06/28/2010    Priority: High    NPSG 06/04/06- AHI ? 23/ hr, desaturation to 66%, CPAP titrated to 8, body weight 242 lbs. HST 02/05/17-AHI 23.4/hour, desaturation to 86%, body weight 192 pounds   . Diverticulosis of colon 10/17/2010    Priority: Medium  . Osteoarthrosis of knee 10/04/2010    Priority: Medium    Discussed synvisc 12/2017; has had PT. Had imaging - mild to moderate OA 2019   . 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 Allergic rhinitis 10/05/2011    Priority: Low  . Internal hemorrhoids 10/17/2010    Priority: Low  . Genetic testing 10/06/2019    Negative genetic testing:  No pathogenic variants detected on the [facility] Breast Cancer STAT Panel + Common Hereditary Cancers Panel. 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 The report date is 10/05/2019.  The Breast Cancer STAT Panel offered by [facility] includes sequencing and deletion/duplication analysis for the following 9 genes:  ATM, BRCA1, BRCA2, CDH1, CHEK2, PALB2, PTEN, STK11 and TP53. The Common Hereditary Cancers Panel offered by [facility] includes sequencing and/or deletion duplication testing of the following 48 genes: APC, ATM, AXIN2, BARD1, BMPR1A, BRCA1, BRCA2, BRIP1, CDH1, CDK4, CDKN2A (p14ARF), CDKN2A (p16INK4a), CHEK2, CTNNA1, DICER1, EPCAM (Deletion/duplication testing only), GREM1 (promoter region deletion/duplication testing only), KIT, MEN1, MLH1, MSH2, MSH3, MSH6, MUTYH, NBN, NF1, NTHL1, PALB2, PDGFRA, PMS2, POLD1, POLE, PTEN, RAD50, RAD51C, RAD51D, RNF43, SDHB, SDHC, SDHD, SMAD4, SMARCA4. STK11, TP53, TSC1, TSC2, and VHL.  The following genes were evaluated for sequence changes only: SDHA and HOXB13 c.251G>A variant only.    . Family history of pancreatic cancer   . Family history of lung  cancer   . Malignant neoplasm of upper-outer quadrant of right breast in female, estrogen receptor positive ([place]) 09/18/2019  . Incomplete uterovaginal prolapse 04/03/2019  . Plantar fasciitis 05/06/2017   Health Maintenance  Topic Date Due  . COVID-19 Vaccine (3 - Moderna risk 4-dose series) 01/23/2020 (Originally 06/18/2019)  . HEMOGLOBIN A1C  07/07/2020  .  FOOT EXAM  07/09/2020  . DEXA SCAN  08/07/2020  . MAMMOGRAM  08/21/2020  . TETANUS/TDAP  10/03/2020  . OPHTHALMOLOGY EXAM  12/07/2020  . COLONOSCOPY  04/01/2026  . INFLUENZA VACCINE  Completed  . Hepatitis C Screening  Completed  . PNA vac Low Risk Adult  Completed   Immunization History  Administered Date(s) Administered  . Fluad Quad(high Dose 65+) 12/15/2019  . Influenza, High Dose Seasonal PF 12/06/2015, 11/01/2016, 09/21/2017, 10/26/2018  . Moderna Sars-Covid-2 Vaccination 04/24/2019, 05/21/2019  . Pneumococcal Conjugate-13 10/22/2014  . Pneumococcal Polysaccharide-23 10/05/2011, 12/21/2016  . Tdap 10/04/2010  . Zoster 10/03/2009  . Zoster Recombinat (Shingrix) 01/02/2019, 03/25/2019   We updated and reviewed the patient's past history in detail and it is documented below. Allergies: Patient is allergic to nickel, sulfa antibiotics, and bydureon [exenatide]. Past Medical History Patient  has a past medical history of Allergy, Arthritis, Breast cancer ([place]) (08/2019), Cataract, Complication of anesthesia, Diabetes mellitus without complication ([place]), Family history of adverse reaction to anesthesia, Family history of lung cancer, Family history of pancreatic cancer, GERD (gastroesophageal reflux disease), Hypertension, Sleep apnea, Thyroid disease, and Ulcerative colitis ([place]). Past Surgical History Patient  has a past surgical history that includes Skin surgery (12/2015); Cholecystectomy; Tonsillectomy and adenoidectomy (1956); Breast lumpectomy with radioactive seed and sentinel lymph node biopsy (Right, 10/14/2019); and  Re-excision of breast lumpectomy (Right, 11/11/2019). Family History: Patient family history includes Cancer in her brother and paternal aunt; Crohn's disease in her brother; Diabetes in her brother, brother, maternal aunt, maternal uncle, and mother; Heart attack in her paternal grandfather; Heart disease in her father; Lung cancer in her paternal aunt; Lung cancer (age of onset: 36) in her father; Other in her sister; Pancreatic cancer (age of onset: 55) in her brother; Ulcerative colitis in her brother and mother; Varicose Veins in her sister. Social History:  Patient  reports that she has never smoked. She has never used smokeless tobacco. She reports current alcohol use. She reports that she does not use drugs.  Review of Systems: Constitutional: negative for fever or malaise Ophthalmic: negative for photophobia, double vision or loss of vision Cardiovascular: negative for chest pain, dyspnea on exertion, or new LE swelling Respiratory: negative for SOB or persistent cough Gastrointestinal: negative for abdominal pain, change in bowel habits or melena Genitourinary: negative for dysuria or gross hematuria, no abnormal uterine bleeding or disharge Musculoskeletal: negative for new gait disturbance or muscular weakness Integumentary: negative for new or persistent rashes, having mild right chest wall soreness from radiation. Neurological: negative for TIA or stroke symptoms Psychiatric: negative for SI or delusions Allergic/Immunologic: negative for hives  Patient Care Team    Relationship Specialty Notifications Start End  Leamon Arnt, MD PCP - General Family Medicine  08/04/16   Deneise Lever, MD Consulting Physician Pulmonary Disease  01/29/17   Juanita Craver, MD Consulting Physician Gastroenterology  03/21/17   Lyndee Hensen, PT Physical Therapist Physical Therapy  12/04/17   Trula Slade, DPM Consulting Physician Podiatry  03/27/18   Katy Apo, MD Consulting Physician  Ophthalmology  04/03/19   Rockwell Germany, RN Oncology Nurse Navigator   09/18/19   Mauro Kaufmann, RN Oncology Nurse Navigator   09/18/19   [name], MD Consulting Physician General Surgery  09/22/19   Truitt Merle, MD Consulting Physician Hematology  09/22/19   Kyung Rudd, MD Consulting Physician Radiation Oncology  09/22/19     Objective  Vitals: BP 112/74   Pulse 86  Temp (!) 97.2 F (36.2 C) (Temporal)   Ht 5' (1.524 m)   Wt 194 lb 12.8 oz (88.4 kg)   SpO2 98%   BMI 38.04 kg/m  General:  Well developed, well nourished, no acute distress  Psych:  Alert and orientedx3,normal mood and affect HEENT:  Normocephalic, atraumatic, non-icteric sclera,  supple neck without adenopathy, mass or thyromegaly Cardiovascular:  Normal S1, S2, RRR without gallop, rub or murmur Respiratory:  Good breath sounds bilaterally, CTAB with normal respiratory effort Gastrointestinal: normal bowel sounds, soft, non-tender, no noted masses. No HSM MSK: no deformities, contusions. Joints are without erythema or swelling.  Skin:  Warm, no rashes or suspicious lesions noted Neurologic:    Mental status is normal. CN 2-11 are normal. Gross motor and sensory exams are normal. Normal gait. No tremor    Commons side effects, risks, benefits, and alternatives for medications and treatment plan prescribed today were discussed, and the patient expressed understanding of the given instructions. Patient is instructed to call or message via MyChart if he/she has any questions or concerns regarding our treatment plan. No barriers to understanding were identified. We discussed Red Flag symptoms and signs in detail. Patient expressed understanding regarding what to do in case of urgent or emergency type symptoms.   Medication list was reconciled, printed and provided to the patient in AVS. Patient instructions and summary information was reviewed with the patient as documented in the AVS. This note was prepared with  assistance of Dragon voice recognition software. Occasional wrong-word or sound-a-like substitutions may have occurred due to the inherent limitations of voice recognition software  This visit occurred during the SARS-CoV-2 public health emergency.  Safety protocols were in place, including screening questions prior to the visit, additional usage of staff PPE, and extensive cleaning of exam room while observing appropriate contact time as indicated for disinfecting solutions.

## 2020-01-07 NOTE — Patient Instructions (Signed)
Please return in 6 months for diabetes and blood pressure follow up.  I have changed invokana to farxiga. This should be taken daily and is in the same class of medication as invokana.   Glad you are doing well overall!  If you have any questions or concerns, please don't hesitate to send me a message via MyChart or call the office at [phone]. Thank you for visiting with [place] today! It's our pleasure caring for you.

## 2020-01-07 NOTE — Telephone Encounter (Signed)
MEDICATION: dapagliflozin propanediol (FARXIGA) 10 MG TABS tablet   PHARMACY: [facility] [address] Phone:  [phone]  Fax:  [phone]       Comments: Patient says her mail in pharmacy does cover it so she would like it sent here.       **Let patient know to contact pharmacy at the end of the day to make sure medication is ready. **  ** Please notify patient to allow 48-72 hours to process**  **Encourage patient to contact the pharmacy for refills or they can request refills through [facility]**

## 2020-01-07 NOTE — Telephone Encounter (Signed)
Script sent to mail order

## 2020-01-08 ENCOUNTER — Ambulatory Visit
Admission: RE | Admit: 2020-01-08 | Discharge: 2020-01-08 | Disposition: A | Discharge status: Home / Self Care | Payer: Medicare Other | Source: Ambulatory Visit | Attending: Radiation Oncology | Admitting: Radiation Oncology

## 2020-01-08 ENCOUNTER — Other Ambulatory Visit: Payer: Self-pay

## 2020-01-08 ENCOUNTER — Ambulatory Visit: Payer: Medicare Other

## 2020-01-08 DIAGNOSIS — M25511 Pain in right shoulder: Secondary | ICD-10-CM

## 2020-01-08 DIAGNOSIS — R293 Abnormal posture: Secondary | ICD-10-CM

## 2020-01-08 DIAGNOSIS — Z170 Estrogen receptor positive status [ER+]: Secondary | ICD-10-CM | POA: Diagnosis not present

## 2020-01-08 DIAGNOSIS — Z510 Encounter for antineoplastic radiation therapy: Secondary | ICD-10-CM | POA: Diagnosis not present

## 2020-01-08 DIAGNOSIS — Z483 Aftercare following surgery for neoplasm: Secondary | ICD-10-CM

## 2020-01-08 DIAGNOSIS — C50411 Malignant neoplasm of upper-outer quadrant of right female breast: Secondary | ICD-10-CM | POA: Diagnosis not present

## 2020-01-08 DIAGNOSIS — G8929 Other chronic pain: Secondary | ICD-10-CM

## 2020-01-08 LAB — CBC WITH DIFFERENTIAL/PLATELET
Absolute Monocytes: 447 cells/uL (ref 200–950)
Basophils Absolute: 52 cells/uL (ref 0–200)
Basophils Relative: 0.9 %
Eosinophils Absolute: 180 cells/uL (ref 15–500)
Eosinophils Relative: 3.1 %
HCT: 44.2 % (ref 35.0–45.0)
Hemoglobin: 14.8 g/dL (ref 11.7–15.5)
Lymphs Abs: 1398 cells/uL (ref 850–3900)
MCH: 29.8 pg (ref 27.0–33.0)
MCHC: 33.5 g/dL (ref 32.0–36.0)
MCV: 88.9 fL (ref 80.0–100.0)
MPV: 9.8 fL (ref 7.5–12.5)
Monocytes Relative: 7.7 %
Neutro Abs: 3724 cells/uL (ref 1500–7800)
Neutrophils Relative %: 64.2 %
Platelets: 329 10*3/uL (ref 140–400)
RBC: 4.97 10*6/uL (ref 3.80–5.10)
RDW: 12.6 % (ref 11.0–15.0)
Total Lymphocyte: 24.1 %
WBC: 5.8 10*3/uL (ref 3.8–10.8)

## 2020-01-08 LAB — COMPLETE METABOLIC PANEL WITH GFR
AG Ratio: 1.8 (calc) (ref 1.0–2.5)
ALT: 12 U/L (ref 6–29)
AST: 12 U/L (ref 10–35)
Albumin: 4.2 g/dL (ref 3.6–5.1)
Alkaline phosphatase (APISO): 69 U/L (ref 37–153)
BUN/Creatinine Ratio: 27 (calc) — ABNORMAL HIGH (ref 6–22)
BUN: 15 mg/dL (ref 7–25)
CO2: 30 mmol/L (ref 20–32)
Calcium: 9.9 mg/dL (ref 8.6–10.4)
Chloride: 102 mmol/L (ref 98–110)
Creat: 0.55 mg/dL — ABNORMAL LOW (ref 0.60–0.93)
GFR, Est African American: 110 mL/min/{1.73_m2} (ref >=60)
GFR, Est Non African American: 95 mL/min/{1.73_m2} (ref >=60)
Globulin: 2.4 g/dL (calc) (ref 1.9–3.7)
Glucose, Bld: 119 mg/dL — ABNORMAL HIGH (ref 65–99)
Potassium: 4.6 mmol/L (ref 3.5–5.3)
Sodium: 140 mmol/L (ref 135–146)
Total Bilirubin: 0.6 mg/dL (ref 0.2–1.2)
Total Protein: 6.6 g/dL (ref 6.1–8.1)

## 2020-01-08 LAB — LIPID PANEL
Cholesterol: 152 mg/dL (ref <200)
HDL: 67 mg/dL (ref >=50)
LDL Cholesterol (Calc): 62 mg/dL (calc)
Non-HDL Cholesterol (Calc): 85 mg/dL (calc) (ref <130)
Total CHOL/HDL Ratio: 2.3 (calc) (ref <5.0)
Triglycerides: 143 mg/dL (ref <150)

## 2020-01-08 LAB — TSH: TSH: 0.92 mIU/L (ref 0.40–4.50)

## 2020-01-08 NOTE — Patient Instructions (Addendum)
Over Head Pull: Narrow and Wide Grip   Cancer Rehab [phone]   On back, knees bent, feet flat, band across thighs, elbows straight but relaxed. Pull hands apart (start). Keeping elbows straight, bring arms up and over head, hands toward floor. Keep pull steady on band. Hold momentarily. Return slowly, keeping pull steady, back to start. Then do same with a wider grip on the band (past shoulder width) Repeat _5-10__ times. Band color __yellow____   Side Pull: Double Arm   On back, knees bent, feet flat. Arms perpendicular to body, shoulder level, elbows straight but relaxed. Pull arms out to sides, elbows straight. Resistance band comes across collarbones, hands toward floor. Hold momentarily. Slowly return to starting position. Repeat _5-10__ times. Band color _yellow____   Sword   On back, knees bent, feet flat, left hand on left hip, right hand above left. Pull right arm DIAGONALLY (hip to shoulder) across chest. Bring right arm along head toward floor. Hold momentarily. Slowly return to starting position. Repeat _5-10__ times. Do with left arm. Band color _yellow_____   Shoulder Rotation: Double Arm   On back, knees bent, feet flat, elbows tucked at sides, bent 90, hands palms up. Pull hands apart and down toward floor, keeping elbows near sides. Hold momentarily. Slowly return to starting position. Repeat _5-10__ times. Band color __yellow____   Then can progress to red theraband once easier.

## 2020-01-08 NOTE — Therapy (Addendum)
[place], [place], [address] Phone: [phone]   Fax:  [phone]  Physical Therapy Treatment  Patient Details  Name: Michelle Sherman MRN: [id number] Date of Birth: [birth year] Referring Provider (PT): Dr. [name]   Encounter Date: 01/08/2020   PT End of Session - 01/08/20 1205    Visit Number 5    Number of Visits 13    Date for PT Re-Evaluation 02/02/20    PT Start Time 1102    PT Stop Time 1204    PT Time Calculation (min) 62 min    Activity Tolerance Patient tolerated treatment well    Behavior During Therapy [facility] for tasks assessed/performed           Past Medical History:  Diagnosis Date  . Allergy   . Arthritis   . Breast cancer ([place]) 08/2019   right breast IDC  . Cataract   . Complication of anesthesia    unable to void after surgery and had to stay overnight   . Diabetes mellitus without complication ([place])   . Family history of adverse reaction to anesthesia    sister, hard to wake up   . Family history of lung cancer   . Family history of pancreatic cancer   . GERD (gastroesophageal reflux disease)   . Hypertension   . Sleep apnea    wears CPAP nightly  . Thyroid disease   . Ulcerative colitis [facility])     Past Surgical History:  Procedure Laterality Date  . BREAST LUMPECTOMY WITH RADIOACTIVE SEED AND SENTINEL LYMPH NODE BIOPSY Right 10/14/2019   Procedure: RIGHT BREAST LUMPECTOMY WITH RADIOACTIVE SEED AND SENTINEL LYMPH NODE BIOPSY;  Surgeon: [name], MD;  Location: [place];  Service: General;  Laterality: Right;  . CHOLECYSTECTOMY    . RE-EXCISION OF BREAST LUMPECTOMY Right 11/11/2019   Procedure: RIGHT RE-EXCISION OF BREAST LUMPECTOMY;  Surgeon: [name], MD;  Location: [place];  Service: General;  Laterality: Right;  RNFA  . SKIN SURGERY  12/2015   Fatty tumor removed  . TONSILLECTOMY AND ADENOIDECTOMY  1956     There were no vitals filed for this visit.   Subjective Assessment - 01/08/20 1104    Subjective My Rt shoulder is really starting to do better. I am trying to do my HEP as often as I can and I am much more aware of my posture which I also think is helping. My intensity of pain has decreased over the past 1-2 weeks, especially with getting into radiation position.    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 She had a right lumpectomy and sentinel node biopsy (2 negative nodes) on 10/14/2019. It is ER/PR positive and HER2 negative with a Ki67 of 5%. She cares for her husband who is in a motorized wheelchair and has bilateral BKA.    Patient Stated Goals Help with right arm pain,    Currently in Pain? No/denies                             [facility] Adult PT Treatment/Exercise - 01/08/20 0001      Shoulder Exercises: Supine   Horizontal ABduction Strengthening;Both;5 reps;Theraband    Theraband Level (Shoulder Horizontal ABduction) Level 2 (Red)    Horizontal ABduction Limitations Instructed pt with red and low reps, but told her to use yellow for  now at home and 10 reps    External Rotation Strengthening;Both;5 reps;Theraband    Theraband Level (Shoulder External Rotation) Level 2 (Red)    Flexion Strengthening;Both;5 reps;Theraband   Narrow and Wide grip, 5x each   Theraband Level (Shoulder Flexion) Level 2 (Red)    Diagonals Strengthening;Right;Left;5 reps;Theraband    Theraband Level (Shoulder Diagonals) Level 2 (Red)      Shoulder Exercises: Pulleys   Flexion 2 minutes    Flexion Limitations No popping, required VCs throughout to decrease Rt scapular compensation    ABduction 2 minutes    ABduction Limitations No popping, cuing to decrease Rt scapular compensation      Shoulder Exercises: Therapy Ball   Flexion Both;10 reps   forward lean into end of stretch   Flexion Limitations pt returned  therapist demo      Manual Therapy   Joint Mobilization Posterior and inferiorly to Rt shoulder    Soft tissue mobilization To Rt anterior and superior shoulder joint during P/ROM with coconut oil    Passive ROM In Supine to Rt shoulder into flexion, abduction, D2 and IR/er; pt continues with much improved end P/ROM and no c/o pain/discomfort                  PT Education - 01/08/20 1124    Education Details Supine scapular series    Person(s) Educated Patient    Methods Explanation;Demonstration;Handout    Comprehension Verbalized understanding;Returned demonstration;Need further instruction               PT Long Term Goals - 12/22/19 2147      PT LONG TERM GOAL #1   Title pt will be independent in HEP for right shoulder ROM/strenght    Time 4    Period Weeks    Status New    Target Date 01/19/20      PT LONG TERM GOAL #2   Title Pt will have decreased right shoulder pain by 50% or greater    Time 6    Period Weeks    Status New    Target Date 02/02/20      PT LONG TERM GOAL #3   Title Pt to demo improved strength of R shoulder, to at least 4+/5 to improve ability for reaching, lifting, and IADLS.     Time 6    Period Weeks    Status New    Target Date 02/02/20      PT LONG TERM GOAL #4   Title Pt will have AROM right shoulder WNL when compared with left    Baseline abd 85 A    Time 6    Period Weeks    Status New    Target Date 02/02/20      PT LONG TERM GOAL #5   Title Quick dash will be no greater than 10% to demonstrate improved right UE function    Time 6    Period Weeks    Status New      Additional Long Term Goals   Additional Long Term Goals Yes      PT LONG TERM GOAL #6   Title Pt will  be fit for prophylactic sleeve prn    Time 4    Period Weeks    Status New                 Plan - 01/08/20 1205    Clinical Impression Statement Pt continues to report improvement with Rt shoulder pain.  She still has "twinges" with  radiation positioning but intensity with this is much improved since start of care. Able to progress HEP to supine scpaular series. She tolerated this very well with no reports of increased pain. Used and issued red theraband with low reps, but instructed pt to use yellow for 10 reps at home until easier, then progress to red. She verbalized understanding. Overall pt is very pleased with progress gained thus far with reduction of pain and increased A/ROM. She will benefit from continued physical therapy at this time to help her achieve full ROM, especially while currently undergoing daily radiation treatments. No iontophoresis today as pt will have radiation this afternoon.    Personal Factors and Comorbidities Comorbidity 1    Comorbidities right lumpectomy with SLNB    Examination-Activity Limitations Caring for Others;Lift;Sleep;Reach Overhead    Examination-Participation Restrictions Cleaning    Stability/Clinical Decision Making Stable/Uncomplicated    Rehab Potential Excellent    PT Frequency 2x / week    PT Duration 6 weeks    PT Treatment/Interventions ADLs/Self Care Home Management;Therapeutic exercise;Patient/family education;Iontophoresis 32m/ml Dexamethasone;Neuromuscular re-education;Manual techniques;Manual lymph drainage;Compression bandaging;Passive range of motion;Taping    PT Next Visit Plan Cont strengthening of right shoulder ( impingement), review HEP;  postural education, cont ionto if pt comes after having radiation for the day; PROM, UT stretches, STW prn; Keep watch on right UE for swelling    PT Home Exercise Plan Post op shoulder ROM HEP; isometrics for Rt shoulder; supine A/ROM for Rt shoulder over towel roll and modified downward dog, Scapular retraction and shoulder extension with red TBand, supine scapular series with yellow theraband, progress to red when able    Consulted and Agree with Plan of Care Patient           Patient will benefit from skilled therapeutic  intervention in order to improve the following deficits and impairments:  Decreased knowledge of precautions,Pain,Postural dysfunction,Decreased range of motion,Impaired UE functional use,Decreased activity tolerance,Decreased strength  Visit Diagnosis: Chronic right shoulder pain  Malignant neoplasm of upper-outer quadrant of right breast in female, estrogen receptor positive (HCC)  Abnormal posture  Aftercare following surgery for neoplasm     Problem List Patient Active Problem List   Diagnosis Date Noted  . Genetic testing 10/06/2019  . Family history of pancreatic cancer   . Family history of lung cancer   . Malignant neoplasm of upper-outer quadrant of right breast in female, estrogen receptor positive ([place] 09/18/2019  . Incomplete uterovaginal prolapse 04/03/2019  . 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 Plantar fasciitis 05/06/2017  . Controlled type 2 diabetes mellitus without complication, without long-term current use of insulin ([place] 11/01/2016  . Hypertension associated with diabetes ([place] 11/01/2016  . Severe obesity (BMI 35.0-35.9 with comorbidity) ([place] [place] 11/01/2016  . Combined hyperlipidemia associated with type 2 diabetes mellitus ([place] 01/15/2012  . Allergic rhinitis 10/05/2011  . Ulcerative colitis without complications ([facility] [place] 078/29/5621 . Diverticulosis of colon 10/17/2010  . Internal hemorrhoids 10/17/2010  . Acquired hypothyroidism 10/04/2010  . Osteoarthrosis of knee 10/04/2010  . OSA on CPAP 06/28/2010    ROtelia Limes PTA 01/08/2020, 12:26 PM  [place] [address]Phone: [phone]  Fax:  [phone] Name: Michelle PARRELLAMRN: [id number] of Birth: 725-Sep-1952

## 2020-01-09 ENCOUNTER — Ambulatory Visit: Payer: Medicare Other | Admitting: Radiation Oncology

## 2020-01-09 ENCOUNTER — Ambulatory Visit
Admission: RE | Admit: 2020-01-09 | Discharge: 2020-01-09 | Disposition: A | Discharge status: Home / Self Care | Payer: Medicare Other | Source: Ambulatory Visit | Attending: Radiation Oncology | Admitting: Radiation Oncology

## 2020-01-09 DIAGNOSIS — C50411 Malignant neoplasm of upper-outer quadrant of right female breast: Secondary | ICD-10-CM | POA: Diagnosis not present

## 2020-01-09 DIAGNOSIS — Z510 Encounter for antineoplastic radiation therapy: Secondary | ICD-10-CM | POA: Diagnosis not present

## 2020-01-09 DIAGNOSIS — Z170 Estrogen receptor positive status [ER+]: Secondary | ICD-10-CM | POA: Diagnosis not present

## 2020-01-10 ENCOUNTER — Encounter: Payer: Self-pay | Admitting: Family Medicine

## 2020-01-12 ENCOUNTER — Other Ambulatory Visit: Payer: Self-pay

## 2020-01-12 ENCOUNTER — Ambulatory Visit
Admission: RE | Admit: 2020-01-12 | Discharge: 2020-01-12 | Disposition: A | Discharge status: Home / Self Care | Payer: Medicare Other | Source: Ambulatory Visit | Attending: Radiation Oncology | Admitting: Radiation Oncology

## 2020-01-12 ENCOUNTER — Ambulatory Visit: Payer: Medicare Other

## 2020-01-12 DIAGNOSIS — Z510 Encounter for antineoplastic radiation therapy: Secondary | ICD-10-CM | POA: Diagnosis not present

## 2020-01-12 DIAGNOSIS — G8929 Other chronic pain: Secondary | ICD-10-CM

## 2020-01-12 DIAGNOSIS — R293 Abnormal posture: Secondary | ICD-10-CM

## 2020-01-12 DIAGNOSIS — C50411 Malignant neoplasm of upper-outer quadrant of right female breast: Secondary | ICD-10-CM

## 2020-01-12 DIAGNOSIS — M25511 Pain in right shoulder: Secondary | ICD-10-CM | POA: Diagnosis not present

## 2020-01-12 DIAGNOSIS — Z170 Estrogen receptor positive status [ER+]: Secondary | ICD-10-CM | POA: Diagnosis not present

## 2020-01-12 DIAGNOSIS — Z483 Aftercare following surgery for neoplasm: Secondary | ICD-10-CM

## 2020-01-12 NOTE — Therapy (Signed)
[place], [place], [address] Phone: [phone]   Fax:  [phone]  Physical Therapy Treatment  Patient Details  Name: Michelle Sherman MRN: [id number] Date of Birth: 08-11-1950 Referring Provider (PT): Dr. [name]   Encounter Date: 01/12/2020   PT End of Session - 01/12/20 0947    Visit Number 6    Number of Visits 13    Date for PT Re-Evaluation 02/02/20    PT Start Time 0905    PT Stop Time 0955    PT Time Calculation (min) 50 min    Activity Tolerance Patient tolerated treatment well    Behavior During Therapy [facility] for tasks assessed/performed           Past Medical History:  Diagnosis Date  . Allergy   . Arthritis   . Breast cancer ([place]) 08/2019   right breast IDC  . Cataract   . Complication of anesthesia    unable to void after surgery and had to stay overnight   . Diabetes mellitus without complication ([place])   . Family history of adverse reaction to anesthesia    sister, hard to wake up   . Family history of lung cancer   . Family history of pancreatic cancer   . GERD (gastroesophageal reflux disease)   . Hypertension   . Sleep apnea    wears CPAP nightly  . Thyroid disease   . Ulcerative colitis [facility])     Past Surgical History:  Procedure Laterality Date  . BREAST LUMPECTOMY WITH RADIOACTIVE SEED AND SENTINEL LYMPH NODE BIOPSY Right 10/14/2019   Procedure: RIGHT BREAST LUMPECTOMY WITH RADIOACTIVE SEED AND SENTINEL LYMPH NODE BIOPSY;  Surgeon: [name], MD;  Location: [place];  Service: General;  Laterality: Right;  . CHOLECYSTECTOMY    . RE-EXCISION OF BREAST LUMPECTOMY Right 11/11/2019   Procedure: RIGHT RE-EXCISION OF BREAST LUMPECTOMY;  Surgeon: [name], MD;  Location: [place];  Service: General;  Laterality: Right;  RNFA  . SKIN SURGERY  12/2015   Fatty tumor removed  . TONSILLECTOMY AND ADENOIDECTOMY  1956     There were no vitals filed for this visit.   Subjective Assessment - 01/12/20 0907    Subjective Shoulder has been good for the most part, but I think its because I reached high for something.  Trying to be more concious of posture. I had a normal response to the Ionto last time. I had a rash Saturday under my right breast after wearing my sports bra.  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 She had a right lumpectomy and sentinel node biopsy (2 negative nodes) on 10/14/2019. It is ER/PR positive and HER2 negative with a Ki67 of 5%. She cares for her husband who is in a motorized wheelchair and has bilateral BKA.    Limitations House hold activities    Currently in Pain? No/denies    Pain Score 0-No pain                             [facility] Adult PT Treatment/Exercise - 01/12/20 0001      Shoulder Exercises: Supine   Horizontal ABduction Strengthening;Both;10 reps    Theraband Level (Shoulder Horizontal ABduction) Level 1 (Yellow)    External Rotation Strengthening;Both;10 reps    Theraband Level (Shoulder External  Rotation) Level 1 (Yellow)    Flexion Strengthening;Both;10 reps    Theraband Level (Shoulder Flexion) Level 1 (Yellow)    Diagonals Strengthening;Both;10 reps    Theraband Level (Shoulder Diagonals) Level 1 (Yellow)      Shoulder Exercises: Standing   Extension Strengthening;Both;10 reps    Theraband Level (Shoulder Extension) Level 1 (Yellow)    Retraction Strengthening;Both;10 reps    Theraband Level (Shoulder Retraction) Level 1 (Yellow)      Shoulder Exercises: Pulleys   Flexion 2 minutes    ABduction 2 minutes      Manual Therapy   Manual therapy comments assessed skin on right breast. mild redness where markers were removed but otherwise skin looks good.  No rash under breast    Joint Mobilization Posterior and inferiorly to Rt shoulder     Soft tissue mobilization TFM to right proximal biceps    Passive ROM In Supine to Rt shoulder into flexion, abduction, D2 and IR/er; pt continues with much improved end P/ROM and no c/o pain/discomfort                       PT Long Term Goals - 12/22/19 2147      PT LONG TERM GOAL #1   Title pt will be independent in HEP for right shoulder ROM/strenght    Time 4    Period Weeks    Status New    Target Date 01/19/20      PT LONG TERM GOAL #2   Title Pt will have decreased right shoulder pain by 50% or greater    Time 6    Period Weeks    Status New    Target Date 02/02/20      PT LONG TERM GOAL #3   Title Pt to demo improved strength of R shoulder, to at least 4+/5 to improve ability for reaching, lifting, and IADLS.     Time 6    Period Weeks    Status New    Target Date 02/02/20      PT LONG TERM GOAL #4   Title Pt will have AROM right shoulder WNL when compared with left    Baseline abd 85 A    Time 6    Period Weeks    Status New    Target Date 02/02/20      PT LONG TERM GOAL #5   Title Quick dash will be no greater than 10% to demonstrate improved right UE function    Time 6    Period Weeks    Status New      Additional Long Term Goals   Additional Long Term Goals Yes      PT LONG TERM GOAL #6   Title Pt will  be fit for prophylactic sleeve prn    Time 4    Period Weeks    Status New                 Plan - 01/12/20 4235    Clinical Impression Statement Pt has 1 more week of radiation and then she is finished.  Right shoulder is overall feeling better.  Ionto held today secondary to radiation early afternooon.  Improving with PROM and less popping in shoulder today.  She still requires VC's to depress scapula at times.  Able to return demonstrate supine exs with occasional VC's.    Personal Factors and Comorbidities Comorbidity 1    Comorbidities right lumpectomy with SLNB  Examination-Activity Limitations Caring for  Others;Lift;Sleep;Reach Overhead    Stability/Clinical Decision Making Stable/Uncomplicated    Rehab Potential Excellent    PT Frequency 2x / week    PT Duration 6 weeks    PT Treatment/Interventions ADLs/Self Care Home Management;Therapeutic exercise;Patient/family education;Iontophoresis 28m/ml Dexamethasone;Neuromuscular re-education;Manual techniques;Manual lymph drainage;Compression bandaging;Passive range of motion;Taping    PT Next Visit Plan Cont strengthening of right shoulder ( impingement), review HEP;  postural education, cont ionto if pt comes after having radiation for the day; PROM, UT stretches, STW prn; Keep watch on right UE for swelling    PT Home Exercise Plan Post op shoulder ROM HEP; isometrics for Rt shoulder; supine A/ROM for Rt shoulder over towel roll and modified downward dog, Scapular retraction and shoulder extension with red TBand, supine scapular series with yellow theraband, progress to red when able    Consulted and Agree with Plan of Care Patient           Patient will benefit from skilled therapeutic intervention in order to improve the following deficits and impairments:  Decreased knowledge of precautions,Pain,Postural dysfunction,Decreased range of motion,Impaired UE functional use,Decreased activity tolerance,Decreased strength  Visit Diagnosis: Chronic right shoulder pain  Malignant neoplasm of upper-outer quadrant of right breast in female, estrogen receptor positive (HCC)  Abnormal posture  Aftercare following surgery for neoplasm     Problem List Patient Active Problem List   Diagnosis Date Noted  . Genetic testing 10/06/2019  . Family history of pancreatic cancer   . Family history of lung cancer   . Malignant neoplasm of upper-outer quadrant of right breast in female, estrogen receptor positive ([place] 09/18/2019  . Incomplete uterovaginal prolapse 04/03/2019  . 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 Plantar fasciitis 05/06/2017  .  Controlled type 2 diabetes mellitus without complication, without long-term current use of insulin ([facility] 11/01/2016  . Hypertension associated with diabetes ([place] 11/01/2016  . Severe obesity (BMI 35.0-35.9 with comorbidity) ([place] 11/01/2016  . Combined hyperlipidemia associated with type 2 diabetes mellitus ([place] 01/15/2012  . Allergic rhinitis 10/05/2011  . Ulcerative colitis without complications ([place] 051/70/0174 . Diverticulosis of colon 10/17/2010  . Internal hemorrhoids 10/17/2010  . Acquired hypothyroidism 10/04/2010  . Osteoarthrosis of knee 10/04/2010  . OSA on CPAP 06/28/2010    RClaris Pong12/20/2021, 9:58 AM  [place] [address]Phone: [phone]  Fax:  [phone] Name: KSADI ARAVEMRN: [id number] of Birth: [birth year] RCheral Almas PT 01/12/20 9:58 AM

## 2020-01-13 ENCOUNTER — Other Ambulatory Visit: Payer: Self-pay

## 2020-01-13 ENCOUNTER — Ambulatory Visit
Admission: RE | Admit: 2020-01-13 | Discharge: 2020-01-13 | Disposition: A | Discharge status: Home / Self Care | Payer: Medicare Other | Source: Ambulatory Visit | Attending: Radiation Oncology | Admitting: Radiation Oncology

## 2020-01-13 DIAGNOSIS — Z510 Encounter for antineoplastic radiation therapy: Secondary | ICD-10-CM | POA: Diagnosis not present

## 2020-01-13 DIAGNOSIS — C50411 Malignant neoplasm of upper-outer quadrant of right female breast: Secondary | ICD-10-CM | POA: Diagnosis not present

## 2020-01-13 DIAGNOSIS — Z170 Estrogen receptor positive status [ER+]: Secondary | ICD-10-CM | POA: Diagnosis not present

## 2020-01-14 ENCOUNTER — Ambulatory Visit
Admission: RE | Admit: 2020-01-14 | Discharge: 2020-01-14 | Disposition: A | Discharge status: Home / Self Care | Payer: Medicare Other | Source: Ambulatory Visit | Attending: Radiation Oncology | Admitting: Radiation Oncology

## 2020-01-14 DIAGNOSIS — C50411 Malignant neoplasm of upper-outer quadrant of right female breast: Secondary | ICD-10-CM | POA: Diagnosis not present

## 2020-01-14 DIAGNOSIS — Z170 Estrogen receptor positive status [ER+]: Secondary | ICD-10-CM | POA: Diagnosis not present

## 2020-01-14 DIAGNOSIS — Z510 Encounter for antineoplastic radiation therapy: Secondary | ICD-10-CM | POA: Diagnosis not present

## 2020-01-15 ENCOUNTER — Ambulatory Visit: Payer: Medicare Other

## 2020-01-15 ENCOUNTER — Ambulatory Visit
Admission: RE | Admit: 2020-01-15 | Discharge: 2020-01-15 | Disposition: A | Discharge status: Home / Self Care | Payer: Medicare Other | Source: Ambulatory Visit | Attending: Radiation Oncology | Admitting: Radiation Oncology

## 2020-01-15 ENCOUNTER — Other Ambulatory Visit: Payer: Self-pay

## 2020-01-15 DIAGNOSIS — G8929 Other chronic pain: Secondary | ICD-10-CM | POA: Diagnosis not present

## 2020-01-15 DIAGNOSIS — Z483 Aftercare following surgery for neoplasm: Secondary | ICD-10-CM | POA: Diagnosis not present

## 2020-01-15 DIAGNOSIS — R293 Abnormal posture: Secondary | ICD-10-CM | POA: Diagnosis not present

## 2020-01-15 DIAGNOSIS — Z170 Estrogen receptor positive status [ER+]: Secondary | ICD-10-CM | POA: Diagnosis not present

## 2020-01-15 DIAGNOSIS — C50411 Malignant neoplasm of upper-outer quadrant of right female breast: Secondary | ICD-10-CM | POA: Diagnosis not present

## 2020-01-15 DIAGNOSIS — M25511 Pain in right shoulder: Secondary | ICD-10-CM | POA: Diagnosis not present

## 2020-01-15 DIAGNOSIS — Z510 Encounter for antineoplastic radiation therapy: Secondary | ICD-10-CM | POA: Diagnosis not present

## 2020-01-15 NOTE — Therapy (Signed)
[place], [place], [address] Phone: [phone]   Fax:  [phone]  Physical Therapy Treatment  Patient Details  Name: Michelle Sherman MRN: [id number] Date of Birth: April 17, 1950 Referring Provider (PT): Dr. [name]   Encounter Date: 01/15/2020   PT End of Session - 01/15/20 1556    Visit Number 7    Number of Visits 13    Date for PT Re-Evaluation 02/02/20    PT Start Time 1503    PT Stop Time 1554    PT Time Calculation (min) 51 min    Activity Tolerance Patient tolerated treatment well    Behavior During Therapy [facility] for tasks assessed/performed           Past Medical History:  Diagnosis Date  . Allergy   . Arthritis   . Breast cancer ([place]) 08/2019   right breast IDC  . Cataract   . Complication of anesthesia    unable to void after surgery and had to stay overnight   . Diabetes mellitus without complication ([place])   . Family history of adverse reaction to anesthesia    sister, hard to wake up   . Family history of lung cancer   . Family history of pancreatic cancer   . GERD (gastroesophageal reflux disease)   . Hypertension   . Sleep apnea    wears CPAP nightly  . Thyroid disease   . Ulcerative colitis [facility])     Past Surgical History:  Procedure Laterality Date  . BREAST LUMPECTOMY WITH RADIOACTIVE SEED AND SENTINEL LYMPH NODE BIOPSY Right 10/14/2019   Procedure: RIGHT BREAST LUMPECTOMY WITH RADIOACTIVE SEED AND SENTINEL LYMPH NODE BIOPSY;  Surgeon: [name], MD;  Location: [place];  Service: General;  Laterality: Right;  . CHOLECYSTECTOMY    . RE-EXCISION OF BREAST LUMPECTOMY Right 11/11/2019   Procedure: RIGHT RE-EXCISION OF BREAST LUMPECTOMY;  Surgeon: [name], MD;  Location: [place];  Service: General;  Laterality: Right;  RNFA  . SKIN SURGERY  12/2015   Fatty tumor removed  . TONSILLECTOMY AND ADENOIDECTOMY  1956     There were no vitals filed for this visit.   Subjective Assessment - 01/15/20 1502    Subjective Shoulder has hurt the last couple of days.  It started Tuesday night before bed time.  Its been sore off and on ever since.  Has had radiation already today so I can do Ionto.    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 She had a right lumpectomy and sentinel node biopsy (2 negative nodes) on 10/14/2019. It is ER/PR positive and HER2 negative with a Ki67 of 5%. She cares for her husband who is in a motorized wheelchair and has bilateral BKA.    Currently in Pain? Yes    Pain Score 6     Pain Location Shoulder    Pain Orientation Right    Pain Descriptors / Indicators Sharp;Aching    Pain Type Chronic pain    Pain Onset More than a month ago    Pain Frequency Intermittent                             [facility] Adult PT Treatment/Exercise - 01/15/20 0001      Shoulder Exercises: Seated   Diagonals Limitations seated scapular depression with PT overpressure x 20  Shoulder Exercises: Standing   External Rotation Both;10 reps    Theraband Level (Shoulder External Rotation) Level 1 (Yellow)    Extension Strengthening;Both;15 reps    Theraband Level (Shoulder Extension) Level 1 (Yellow)    Extension Limitations VC to depress scapula    Retraction Strengthening;Both;15 reps    Theraband Level (Shoulder Retraction) Level 1 (Yellow)      Shoulder Exercises: Pulleys   Flexion 2 minutes    Flexion Limitations no popping    ABduction 2 minutes    ABduction Limitations no popping      Manual Therapy   Manual therapy comments right arm appears red at upper arm; advised pt to keep watch on it    Joint Mobilization Posterior and inferiorly to Rt shoulder    Passive ROM In Supine to Rt shoulder into flexion, abduction, D2 and IR/er; pt continues with much improved end P/ROM and no c/o pain/discomfort                        PT Long Term Goals - 12/22/19 2147      PT LONG TERM GOAL #1   Title pt will be independent in HEP for right shoulder ROM/strenght    Time 4    Period Weeks    Status New    Target Date 01/19/20      PT LONG TERM GOAL #2   Title Pt will have decreased right shoulder pain by 50% or greater    Time 6    Period Weeks    Status New    Target Date 02/02/20      PT LONG TERM GOAL #3   Title Pt to demo improved strength of R shoulder, to at least 4+/5 to improve ability for reaching, lifting, and IADLS.     Time 6    Period Weeks    Status New    Target Date 02/02/20      PT LONG TERM GOAL #4   Title Pt will have AROM right shoulder WNL when compared with left    Baseline abd 85 A    Time 6    Period Weeks    Status New    Target Date 02/02/20      PT LONG TERM GOAL #5   Title Quick dash will be no greater than 10% to demonstrate improved right UE function    Time 6    Period Weeks    Status New      Additional Long Term Goals   Additional Long Term Goals Yes      PT LONG TERM GOAL #6   Title Pt will  be fit for prophylactic sleeve prn    Time 4    Period Weeks    Status New                 Plan - 01/15/20 1556    Clinical Impression Statement Pt. developed increased shoulder pain Tuesday PM, however, we had not been able to do Ionto for several treatments secondary to radiation.  She was able to tolerate selected exercises in clinic today without increased pain.  She did have redness noted today greatest at right lateral upper arm for unknown reasons.  She indicated she has had it since prior to radiation and it is not itchy at all.  She will keep an eye on it.  We continued Iontophoresis today and she will remove at 8:00 tonight    Personal Factors  and Comorbidities Comorbidity 1    Comorbidities right lumpectomy with SLNB    Examination-Activity Limitations Caring for Others;Lift;Sleep;Reach Overhead    Stability/Clinical  Decision Making Stable/Uncomplicated    Rehab Potential Excellent    PT Frequency 2x / week    PT Duration 6 weeks    PT Treatment/Interventions ADLs/Self Care Home Management;Therapeutic exercise;Patient/family education;Iontophoresis 79m/ml Dexamethasone;Neuromuscular re-education;Manual techniques;Manual lymph drainage;Compression bandaging;Passive range of motion;Taping    PT Next Visit Plan Cont strengthening of right shoulder ( impingement), review HEP; scapular depression, postural education, cont ionto if pt comes after having radiation for the day; PROM, UT stretches, STW prn; Keep watch on right UE for lateral arm redness    PT Home Exercise Plan Post op shoulder ROM HEP; isometrics for Rt shoulder; supine A/ROM for Rt shoulder over towel roll and modified downward dog, Scapular retraction and shoulder extension with red TBand, supine scapular series with yellow theraband, progress to red when able    Consulted and Agree with Plan of Care Patient           Patient will benefit from skilled therapeutic intervention in order to improve the following deficits and impairments:  Decreased knowledge of precautions,Pain,Postural dysfunction,Decreased range of motion,Impaired UE functional use,Decreased activity tolerance,Decreased strength  Visit Diagnosis: Chronic right shoulder pain  Malignant neoplasm of upper-outer quadrant of right breast in female, estrogen receptor positive (HCC)  Abnormal posture  Aftercare following surgery for neoplasm     Problem List Patient Active Problem List   Diagnosis Date Noted  . Genetic testing 10/06/2019  . Family history of pancreatic cancer   . Family history of lung cancer   . Malignant neoplasm of upper-outer quadrant of right breast in female, estrogen receptor positive ([facility] 09/18/2019  . Incomplete uterovaginal prolapse 04/03/2019  . 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 Plantar fasciitis 05/06/2017  . Controlled type 2 diabetes  mellitus without complication, without long-term current use of insulin ([place] 11/01/2016  . Hypertension associated with diabetes ([place] 11/01/2016  . Severe obesity (BMI 35.0-35.9 with comorbidity) ([facility] 11/01/2016  . Combined hyperlipidemia associated with type 2 diabetes mellitus ([place] 01/15/2012  . Allergic rhinitis 10/05/2011  . Ulcerative colitis without complications ([place] 069/45/0388 . Diverticulosis of colon 10/17/2010  . Internal hemorrhoids 10/17/2010  . Acquired hypothyroidism 10/04/2010  . Osteoarthrosis of knee 10/04/2010  . OSA on CPAP 06/28/2010    RClaris Pong12/23/2021, 4:02 PM  [place] [address]Phone: [phone]  Fax:  [phone] Name: KSUSA BONESMRN: [id number] of Birth: 7Oct 31, 1952RCheral Almas PT 01/15/20 4:03 PM

## 2020-01-19 ENCOUNTER — Encounter: Payer: Self-pay | Admitting: *Deleted

## 2020-01-19 ENCOUNTER — Encounter: Payer: Self-pay | Admitting: Radiation Oncology

## 2020-01-19 ENCOUNTER — Ambulatory Visit: Payer: Medicare Other | Admitting: Rehabilitation

## 2020-01-19 ENCOUNTER — Ambulatory Visit
Admission: RE | Admit: 2020-01-19 | Discharge: 2020-01-19 | Disposition: A | Discharge status: Home / Self Care | Payer: Medicare Other | Source: Ambulatory Visit | Attending: Radiation Oncology | Admitting: Radiation Oncology

## 2020-01-19 DIAGNOSIS — Z510 Encounter for antineoplastic radiation therapy: Secondary | ICD-10-CM | POA: Diagnosis not present

## 2020-01-19 DIAGNOSIS — C50411 Malignant neoplasm of upper-outer quadrant of right female breast: Secondary | ICD-10-CM | POA: Diagnosis not present

## 2020-01-19 DIAGNOSIS — Z170 Estrogen receptor positive status [ER+]: Secondary | ICD-10-CM | POA: Diagnosis not present

## 2020-01-21 ENCOUNTER — Other Ambulatory Visit: Payer: Self-pay

## 2020-01-21 ENCOUNTER — Ambulatory Visit: Payer: Medicare Other

## 2020-01-21 DIAGNOSIS — Z483 Aftercare following surgery for neoplasm: Secondary | ICD-10-CM

## 2020-01-21 DIAGNOSIS — Z170 Estrogen receptor positive status [ER+]: Secondary | ICD-10-CM | POA: Diagnosis not present

## 2020-01-21 DIAGNOSIS — R293 Abnormal posture: Secondary | ICD-10-CM

## 2020-01-21 DIAGNOSIS — G8929 Other chronic pain: Secondary | ICD-10-CM

## 2020-01-21 DIAGNOSIS — M25511 Pain in right shoulder: Secondary | ICD-10-CM | POA: Diagnosis not present

## 2020-01-21 DIAGNOSIS — C50411 Malignant neoplasm of upper-outer quadrant of right female breast: Secondary | ICD-10-CM | POA: Diagnosis not present

## 2020-01-21 NOTE — Therapy (Signed)
[place], [place], [address] Phone: [phone]   Fax:  [phone]  Physical Therapy Treatment  Patient Details  Name: Michelle Sherman MRN: [id number] Date of Birth: [birth year] Referring Provider (PT): Dr. [name]   Encounter Date: 01/21/2020   PT End of Session - 01/21/20 1604    Visit Number 8    Number of Visits 13    Date for PT Re-Evaluation 02/02/20    PT Start Time 0303    PT Stop Time 0352    PT Time Calculation (min) 49 min    Activity Tolerance Patient tolerated treatment well    Behavior During Therapy [facility] for tasks assessed/performed           Past Medical History:  Diagnosis Date  . Allergy   . Arthritis   . Breast cancer ([place]) 08/2019   right breast IDC  . Cataract   . Complication of anesthesia    unable to void after surgery and had to stay overnight   . Diabetes mellitus without complication ([place])   . Family history of adverse reaction to anesthesia    sister, hard to wake up   . Family history of lung cancer   . Family history of pancreatic cancer   . GERD (gastroesophageal reflux disease)   . Hypertension   . Sleep apnea    wears CPAP nightly  . Thyroid disease   . Ulcerative colitis [facility])     Past Surgical History:  Procedure Laterality Date  . BREAST LUMPECTOMY WITH RADIOACTIVE SEED AND SENTINEL LYMPH NODE BIOPSY Right 10/14/2019   Procedure: RIGHT BREAST LUMPECTOMY WITH RADIOACTIVE SEED AND SENTINEL LYMPH NODE BIOPSY;  Surgeon: [name], MD;  Location: [place];  Service: General;  Laterality: Right;  . CHOLECYSTECTOMY    . RE-EXCISION OF BREAST LUMPECTOMY Right 11/11/2019   Procedure: RIGHT RE-EXCISION OF BREAST LUMPECTOMY;  Surgeon: [name], MD;  Location: [place];  Service: General;  Laterality: Right;  RNFA  . SKIN SURGERY  12/2015   Fatty tumor removed  . TONSILLECTOMY AND ADENOIDECTOMY  1956     There were no vitals filed for this visit.   Subjective Assessment - 01/21/20 1459    Subjective Right shoulder has been doing better.  I can pick things up now that are heavier. Intermittently I feel it with raising my arm.  Finished with radiation now.    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 She had a right lumpectomy and sentinel node biopsy (2 negative nodes) on 10/14/2019. It is ER/PR positive and HER2 negative with a Ki67 of 5%. She cares for her husband who is in a motorized wheelchair and has bilateral BKA.    Limitations House hold activities    Currently in Pain? No/denies    Pain Score 0-No pain    Pain Orientation Right    Pain Onset More than a month ago    Aggravating Factors  somtetimes sleeping on right UE, raising arm    Multiple Pain Sites No                             [facility] Adult PT Treatment/Exercise - 01/21/20 0001      Shoulder Exercises: Supine   Horizontal ABduction Strengthening;12 reps    Theraband Level (Shoulder Horizontal ABduction) Level 1 (Yellow)    External Rotation  Strengthening;Both;12 reps    Theraband Level (Shoulder External Rotation) Level 1 (Yellow)    Flexion Strengthening;Both;12 reps    Theraband Level (Shoulder Flexion) Level 1 (Yellow)    Diagonals Strengthening;Right;Left;12 reps    Theraband Level (Shoulder Diagonals) Level 1 (Yellow)      Shoulder Exercises: Seated   Diagonals Limitations seated scapular depression with PT overpressure x 20      Shoulder Exercises: Standing   External Rotation Both;10 reps    Theraband Level (Shoulder External Rotation) Level 1 (Yellow)    Extension Strengthening;15 reps    Theraband Level (Shoulder Extension) Level 1 (Yellow)    Retraction Strengthening;Both;15 reps    Theraband Level (Shoulder Retraction) Level 1 (Yellow)    Other Standing Exercises Jobes flexion and scaption x 10   No resistance      Shoulder Exercises: Pulleys   Flexion 2 minutes    ABduction 2 minutes      Manual Therapy   Joint Mobilization Posterior and inferiorly to Rt shoulder    Passive ROM In Supine to Rt shoulder into flexion, abduction, D2 and IR/er; pt continues with much improved end P/ROM and no c/o pain/discomfort                       PT Long Term Goals - 12/22/19 2147      PT LONG TERM GOAL #1   Title pt will be independent in HEP for right shoulder ROM/strenght    Time 4    Period Weeks    Status New    Target Date 01/19/20      PT LONG TERM GOAL #2   Title Pt will have decreased right shoulder pain by 50% or greater    Time 6    Period Weeks    Status New    Target Date 02/02/20      PT LONG TERM GOAL #3   Title Pt to demo improved strength of R shoulder, to at least 4+/5 to improve ability for reaching, lifting, and IADLS.     Time 6    Period Weeks    Status New    Target Date 02/02/20      PT LONG TERM GOAL #4   Title Pt will have AROM right shoulder WNL when compared with left    Baseline abd 85 A    Time 6    Period Weeks    Status New    Target Date 02/02/20      PT LONG TERM GOAL #5   Title Quick dash will be no greater than 10% to demonstrate improved right UE function    Time 6    Period Weeks    Status New      Additional Long Term Goals   Additional Long Term Goals Yes      PT LONG TERM GOAL #6   Title Pt will  be fit for prophylactic sleeve prn    Time 4    Period Weeks    Status New                 Plan - 01/21/20 1605    Clinical Impression Statement Pt is getting good response to Ionto and notices decreased right shoudre pain.  good tolerance for ROM and strengthening today without increased pain.  Occasional small pop heard with jobes flexion but no increased pain.  Still mildly tender over proximal biceps    Personal Factors and Comorbidities Comorbidity 1  Comorbidities right lumpectomy with SLNB    Examination-Activity  Limitations Caring for Others;Lift;Sleep;Reach Overhead    Examination-Participation Restrictions Cleaning    Stability/Clinical Decision Making Stable/Uncomplicated    Rehab Potential Excellent    PT Frequency 2x / week    PT Duration 6 weeks    PT Treatment/Interventions ADLs/Self Care Home Management;Therapeutic exercise;Patient/family education;Iontophoresis 50m/ml Dexamethasone;Neuromuscular re-education;Manual techniques;Manual lymph drainage;Compression bandaging;Passive range of motion;Taping    PT Next Visit Plan right shoulder strength/stab exs, scap depression, continue Ionto (check skin after Ionto)    PT Home Exercise Plan Post op shoulder ROM HEP; isometrics for Rt shoulder; supine A/ROM for Rt shoulder over towel roll and modified downward dog, Scapular retraction and shoulder extension with red TBand, supine scapular series with yellow theraband, progress to red when able    Consulted and Agree with Plan of Care Patient           Patient will benefit from skilled therapeutic intervention in order to improve the following deficits and impairments:  Decreased knowledge of precautions,Pain,Postural dysfunction,Decreased range of motion,Impaired UE functional use,Decreased activity tolerance,Decreased strength  Visit Diagnosis: Chronic right shoulder pain  Malignant neoplasm of upper-outer quadrant of right breast in female, estrogen receptor positive (HCC)  Abnormal posture  Aftercare following surgery for neoplasm     Problem List Patient Active Problem List   Diagnosis Date Noted  . Genetic testing 10/06/2019  . Family history of pancreatic cancer   . Family history of lung cancer   . Malignant neoplasm of upper-outer quadrant of right breast in female, estrogen receptor positive ([facility] 09/18/2019  . Incomplete uterovaginal prolapse 04/03/2019  . 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 Plantar fasciitis 05/06/2017  . Controlled type 2 diabetes mellitus without  complication, without long-term current use of insulin ([place] 11/01/2016  . Hypertension associated with diabetes ([facility] 11/01/2016  . Severe obesity (BMI 35.0-35.9 with comorbidity) ([facility] 11/01/2016  . Combined hyperlipidemia associated with type 2 diabetes mellitus ([facility] 01/15/2012  . Allergic rhinitis 10/05/2011  . Ulcerative colitis without complications ([facility] 035/70/1779 . Diverticulosis of colon 10/17/2010  . Internal hemorrhoids 10/17/2010  . Acquired hypothyroidism 10/04/2010  . Osteoarthrosis of knee 10/04/2010  . OSA on CPAP 06/28/2010    RClaris Pong12/29/2021, 4:08 PM  [place] [address]Phone: [phone]  Fax:  [phone] Name: Michelle LASUREMRN: [id number] of Birth: 7September 16, 1952 RCheral Almas PT 01/21/20 4:08 PM

## 2020-01-22 ENCOUNTER — Ambulatory Visit: Payer: Medicare Other

## 2020-01-22 DIAGNOSIS — M25511 Pain in right shoulder: Secondary | ICD-10-CM

## 2020-01-22 DIAGNOSIS — R293 Abnormal posture: Secondary | ICD-10-CM | POA: Diagnosis not present

## 2020-01-22 DIAGNOSIS — C50411 Malignant neoplasm of upper-outer quadrant of right female breast: Secondary | ICD-10-CM

## 2020-01-22 DIAGNOSIS — Z483 Aftercare following surgery for neoplasm: Secondary | ICD-10-CM

## 2020-01-22 DIAGNOSIS — G8929 Other chronic pain: Secondary | ICD-10-CM

## 2020-01-22 DIAGNOSIS — Z170 Estrogen receptor positive status [ER+]: Secondary | ICD-10-CM

## 2020-01-22 NOTE — Therapy (Signed)
[place], [place], [address] Phone: [phone]   Fax:  [phone]  Physical Therapy Treatment  Patient Details  Name: Michelle Sherman MRN: [id number] Date of Birth: 1950-06-14 Referring Provider (PT): Dr. [name]   Encounter Date: 01/22/2020   PT End of Session - 01/22/20 1552    Visit Number 9    Number of Visits 13    Date for PT Re-Evaluation 02/02/20    PT Start Time 1500    PT Stop Time 1548    PT Time Calculation (min) 48 min    Activity Tolerance Patient tolerated treatment well    Behavior During Therapy [facility] for tasks assessed/performed           Past Medical History:  Diagnosis Date  . Allergy   . Arthritis   . Breast cancer ([place]) 08/2019   right breast IDC  . Cataract   . Complication of anesthesia    unable to void after surgery and had to stay overnight   . Diabetes mellitus without complication ([place])   . Family history of adverse reaction to anesthesia    sister, hard to wake up   . Family history of lung cancer   . Family history of pancreatic cancer   . GERD (gastroesophageal reflux disease)   . Hypertension   . Sleep apnea    wears CPAP nightly  . Thyroid disease   . Ulcerative colitis [facility])     Past Surgical History:  Procedure Laterality Date  . BREAST LUMPECTOMY WITH RADIOACTIVE SEED AND SENTINEL LYMPH NODE BIOPSY Right 10/14/2019   Procedure: RIGHT BREAST LUMPECTOMY WITH RADIOACTIVE SEED AND SENTINEL LYMPH NODE BIOPSY;  Surgeon: [name], MD;  Location: [facility];  Service: General;  Laterality: Right;  . CHOLECYSTECTOMY    . RE-EXCISION OF BREAST LUMPECTOMY Right 11/11/2019   Procedure: RIGHT RE-EXCISION OF BREAST LUMPECTOMY;  Surgeon: [name], MD;  Location: [facility];  Service: General;  Laterality: Right;  RNFA  . SKIN SURGERY  12/2015   Fatty tumor removed  . TONSILLECTOMY AND ADENOIDECTOMY  1956     There were no vitals filed for this visit.   Subjective Assessment - 01/22/20 1505    Subjective The patch stayed on well.  Shoulder feels good presently, and no pain.  the exercise makes it feel better.    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 She had a right lumpectomy and sentinel node biopsy (2 negative nodes) on 10/14/2019. It is ER/PR positive and HER2 negative with a Ki67 of 5%. She cares for her husband who is in a motorized wheelchair and has bilateral BKA.                             [facility] Adult PT Treatment/Exercise - 01/22/20 0001      Shoulder Exercises: Supine   Protraction Strengthening;Right;10 reps    Protraction Weight (lbs) 1 lb    Diagonals Limitations supine rhythmic stabs 60/90/120   20 sec ea   Other Supine Exercises alphabet 1#      Shoulder Exercises: Sidelying   External Rotation Strengthening;Right;10 reps    External Rotation Weight (lbs) 1 lb      Shoulder Exercises: Standing   Protraction Strengthening;Both;20 reps   at wall   External Rotation Strengthening;Both;15 reps    Theraband Level (Shoulder External Rotation)  Level 1 (Yellow)    Extension Strengthening;Both;15 reps    Theraband Level (Shoulder Extension) Level 2 (Red)    Retraction Strengthening;Both;15 reps    Theraband Level (Shoulder Retraction) Level 2 (Red)    Shoulder Elevation Strengthening;Both;10 reps   and flexion no resistance   Other Standing Exercises elbow flex 3# 2 x 10    Other Standing Exercises reach to black shelf x 10      Shoulder Exercises: Pulleys   Flexion 2 minutes    ABduction 2 minutes      Shoulder Exercises: Therapy Ball   Other Therapy Ball Exercises scapular stabs 2 D x 20 purple ball      Manual Therapy   Manual therapy comments redness at prox biceps from Ionto;held today    Joint Mobilization Posterior and inferiorly to Rt shoulder    Passive ROM In  Supine to Rt shoulder into flexion, abduction, D2 and IR/er; pt continues with much improved end P/ROM and no c/o pain/discomfort                       PT Long Term Goals - 12/22/19 2147      PT LONG TERM GOAL #1   Title pt will be independent in HEP for right shoulder ROM/strenght    Time 4    Period Weeks    Status New    Target Date 01/19/20      PT LONG TERM GOAL #2   Title Pt will have decreased right shoulder pain by 50% or greater    Time 6    Period Weeks    Status New    Target Date 02/02/20      PT LONG TERM GOAL #3   Title Pt to demo improved strength of R shoulder, to at least 4+/5 to improve ability for reaching, lifting, and IADLS.     Time 6    Period Weeks    Status New    Target Date 02/02/20      PT LONG TERM GOAL #4   Title Pt will have AROM right shoulder WNL when compared with left    Baseline abd 85 A    Time 6    Period Weeks    Status New    Target Date 02/02/20      PT LONG TERM GOAL #5   Title Quick dash will be no greater than 10% to demonstrate improved right UE function    Time 6    Period Weeks    Status New      Additional Long Term Goals   Additional Long Term Goals Yes      PT LONG TERM GOAL #6   Title Pt will  be fit for prophylactic sleeve prn    Time 4    Period Weeks    Status New                 Plan - 01/22/20 1552    Clinical Impression Statement Pt with bumpy redness in area of Ionto patch from yesterday so Ionto was held today.  Progressed pt through stability and strength exs with good tolerance.  Pt felt mild popping with reaching to shelf but had no increased pain.  Shoulder felt good after exercising. Pt relaxes better for PROM    Personal Factors and Comorbidities Comorbidity 1    Comorbidities right lumpectomy with SLNB    Examination-Activity Limitations Caring for Others;Lift;Sleep;Reach Overhead    Stability/Clinical Decision  Making Stable/Uncomplicated    Rehab Potential Excellent    PT  Frequency 2x / week    PT Duration 6 weeks    PT Treatment/Interventions ADLs/Self Care Home Management;Therapeutic exercise;Patient/family education;Iontophoresis 18m/ml Dexamethasone;Neuromuscular re-education;Manual techniques;Manual lymph drainage;Compression bandaging;Passive range of motion;Taping    PT Next Visit Plan right shoulder strength/stab exs, scap depression, continue Ionto if skin OK    PT Home Exercise Plan Post op shoulder ROM HEP; isometrics for Rt shoulder; supine A/ROM for Rt shoulder over towel roll and modified downward dog, Scapular retraction and shoulder extension with red TBand, supine scapular series with yellow theraband, progress to red when able    Consulted and Agree with Plan of Care Patient           Patient will benefit from skilled therapeutic intervention in order to improve the following deficits and impairments:  Decreased knowledge of precautions,Pain,Postural dysfunction,Decreased range of motion,Impaired UE functional use,Decreased activity tolerance,Decreased strength  Visit Diagnosis: Chronic right shoulder pain  Malignant neoplasm of upper-outer quadrant of right breast in female, estrogen receptor positive (HCC)  Abnormal posture  Aftercare following surgery for neoplasm     Problem List Patient Active Problem List   Diagnosis Date Noted  . Genetic testing 10/06/2019  . Family history of pancreatic cancer   . Family history of lung cancer   . Malignant neoplasm of upper-outer quadrant of right breast in female, estrogen receptor positive ([facility] 09/18/2019  . Incomplete uterovaginal prolapse 04/03/2019  . 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 Plantar fasciitis 05/06/2017  . Controlled type 2 diabetes mellitus without complication, without long-term current use of insulin ([facility] 11/01/2016  . Hypertension associated with diabetes ([facility] 11/01/2016  . Severe obesity (BMI 35.0-35.9 with comorbidity) ([facility] 11/01/2016  . Combined  hyperlipidemia associated with type 2 diabetes mellitus ([facility] 01/15/2012  . Allergic rhinitis 10/05/2011  . Ulcerative colitis without complications ([facility] 032/67/1245 . Diverticulosis of colon 10/17/2010  . Internal hemorrhoids 10/17/2010  . Acquired hypothyroidism 10/04/2010  . Osteoarthrosis of knee 10/04/2010  . OSA on CPAP 06/28/2010    RClaris Pong12/30/2021, 3:57 PM  [place] [address]Phone: [phone]  Fax:  [phone] Name: Michelle CDEBACAMRN: [id number] of Birth: 7May 25, 1952 RCheral Almas PT 01/22/20 3:58 PM

## 2020-01-26 NOTE — Progress Notes (Signed)
[place]   [phone] [phone]   Clinic Follow up Note   Patient Care Team: Leamon Arnt, MD as PCP - General (Family Medicine) Deneise Lever, MD as Consulting Physician (Pulmonary Disease) Juanita Craver, MD as Consulting Physician (Gastroenterology) Lyndee Hensen, PT as Physical Therapist (Physical Therapy) Trula Slade, DPM as Consulting Physician (Podiatry) Katy Apo, MD as Consulting Physician (Ophthalmology) Rockwell Germany, RN as Oncology Nurse Navigator Mauro Kaufmann, RN as Oncology Nurse Navigator [name], MD as Consulting Physician (General Surgery) Truitt Merle, MD as Consulting Physician (Hematology) Kyung Rudd, MD as Consulting Physician (Radiation Oncology)  Date of Service:  01/29/2020  CHIEF COMPLAINT: F/u of right breast cancer   SUMMARY OF ONCOLOGIC HISTORY: Oncology History Overview Note  Cancer Staging Malignant neoplasm of upper-outer quadrant of right breast in female, estrogen receptor positive ([place]) Staging form: Breast, AJCC 8th Edition - Clinical stage from 09/12/2019: Stage IA (cT1b, cN0, cM0, G1, ER+, PR+, HER2-) - Signed by Truitt Merle, MD on 09/23/2019    Malignant neoplasm of upper-outer quadrant of right breast in female, estrogen receptor positive ([place])  09/05/2019 Mammogram   IMPRESSION: Indeterminate 9 x 6 x 6 mm mass involving the OUTER RIGHT breast at the 9 o'clock position approximately 5 cm from the nipple at POSTERIOR depth, located anterior to a normal appearing intramammary lymph Node.      09/12/2019 Cancer Staging   Staging form: Breast, AJCC 8th Edition - Clinical stage from 09/12/2019: Stage IA (cT1b, cN0, cM0, G1, ER+, PR+, HER2-) - Signed by Truitt Merle, MD on 09/23/2019   09/12/2019 Initial Biopsy   Diagnosis 1. Breast, right, needle core biopsy, 9 o'clock posterior - INVASIVE DUCTAL CARCINOMA WITH PAPILLARY FEATURES. 2. Breast, right, needle core biopsy, 9 o'clock  anterior - INVASIVE DUCTAL CARCINOMA WITH PAPILLARY FEATURES. Microscopic Comment 1. - 2. The specimens share similar morphologic features. E-cadherin is positive and CK5/6 is negative. P63, Calponin and SMM-1 demonstrate the absence of myoepithelium. Ancillary studies will be reported separately. Results reported to [facility] on 09/15/2019. Intradepartmental consultation (Dr. Tresa Moore).   09/12/2019 Receptors her2   1. PROGNOSTIC INDICATORS Results: IMMUNOHISTOCHEMICAL AND MORPHOMETRIC ANALYSIS PERFORMED MANUALLY The tumor cells are NEGATIVE for Her2 (1+). Estrogen Receptor: 99%, POSITIVE, STRONG STAINING INTENSITY Progesterone Receptor: 99%, POSITIVE, STRONG STAINING INTENSITY Proliferation Marker Ki67: 5%   09/18/2019 Initial Diagnosis   Malignant neoplasm of upper-outer quadrant of right breast in female, estrogen receptor positive ([place])   10/05/2019 Genetic Testing   Negative genetic testing:  No pathogenic variants detected on the [facility] Breast Cancer STAT Panel + Common Hereditary Cancers Panel. 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 The report date is 10/05/2019.  The Breast Cancer STAT Panel offered by [facility] includes sequencing and deletion/duplication analysis for the following 9 genes:  ATM, BRCA1, BRCA2, CDH1, CHEK2, PALB2, PTEN, STK11 and TP53. The Common Hereditary Cancers Panel offered by [facility] includes sequencing and/or deletion duplication testing of the following 48 genes: APC, ATM, AXIN2, BARD1, BMPR1A, BRCA1, BRCA2, BRIP1, CDH1, CDK4, CDKN2A (p14ARF), CDKN2A (p16INK4a), CHEK2, CTNNA1, DICER1, EPCAM (Deletion/duplication testing only), GREM1 (promoter region deletion/duplication testing only), KIT, MEN1, MLH1, MSH2, MSH3, MSH6, MUTYH, NBN, NF1, NTHL1, PALB2, PDGFRA, PMS2, POLD1, POLE, PTEN, RAD50, RAD51C, RAD51D, RNF43, SDHB, SDHC, SDHD, SMAD4, SMARCA4. STK11, TP53, TSC1, TSC2, and VHL.  The following genes  were evaluated for sequence changes only: SDHA and HOXB13 c.251G>A variant only.  10/14/2019 Pathology Results   RIGHT BREAST LUMPECTOMY WITH RADIOACTIVE SEED AND SENTINEL LYMPH NODE BIOPSY by Dr Barry Dienes    FINAL MICROSCOPIC DIAGNOSIS:   A. BREAST, RIGHT, LUMPECTOMY:  - Multifocal invasive ductal carcinoma with papillary features, grade 2,  spanning 0.9 cm and 0.5 cm.  - Intermediate grade ductal carcinoma in situ.  - Invasive carcinoma present at original inferior margin broadly, final  inferior margin (Part F) is negative.  - Margins are negative for in situ carcinoma.  - Biopsy site.  - See oncology table.   B. BREAST, RIGHT ADDITIONAL POSTERIOR MARGIN, EXCISION:  - Benign breast tissue.   C. BREAST, RIGHT ADDITIONAL LATERAL MARGIN, EXCISION:  - Benign breast tissue.   D. BREAST, RIGHT ADDITIONAL SUPERIOR MARGIN, EXCISION:  - Benign breast tissue.   E. BREAST, RIGHT ADDITIONAL MEDIAL MARGIN, EXCISION:  - Invasive ductal carcinoma with papillary features, grade 2, spanning  0.5 cm.  - Invasive carcinoma is present at the new medial margin broadly.   F. BREAST, RIGHT ADDITIONAL INFERIOR MARGIN, EXCISION:  - Benign breast tissue.   G. LYMPH NODE, RIGHT AXILLARY #1, SENTINEL, EXCISION:  - One of one lymph nodes negative for carcinoma (0/1).   H. LYMPH NODE, RIGHT AXILLARY #2, SENTINEL, EXCISION:  - One of one lymph nodes negative for carcinoma (0/1).   10/14/2019 Oncotype testing   Oncotype  Recurrence Score 0 with distant recurrence risk at 9 years of 3%.  There is less then 1% benefit of chemotherapy   10/14/2019 Cancer Staging   Staging form: Breast, AJCC 8th Edition - Pathologic stage from 10/14/2019: Stage IA (pT1b, pN0, cM0, G2, ER+, PR+, HER2-, Oncotype DX score: 0) - Signed by Truitt Merle, MD on 01/29/2020   11/11/2019 Pathology Results   RIGHT RE-EXCISION OF BREAST LUMPECTOMY by Dr Barry Dienes    FINAL MICROSCOPIC DIAGNOSIS:   A. BREAST, RIGHT, RE-EXCISION OF  MEDIAL MARGIN:  - Prior procedure site changes.  - No carcinoma identified.   COMMENT:   P63, Calponin and SMM-1 demonstrate the presence of myoepithelium in the  select focus.    12/22/2019 - 01/19/2020 Radiation Therapy   Adjuvant Radiation with Dr Lisbeth Renshaw       CURRENT THERAPY:  Pending adjuvant anastrozole   INTERVAL HISTORY:  Michelle Sherman is here for a follow up. She presents to the clinic alone.  She finished radiation last week, it went well overall, she is recovering well  Mild pain at surgical site, no other concerns She has good appetite and energy level, functions normally at home   All other systems were reviewed with the patient and are negative.  MEDICAL HISTORY:  Past Medical History:  Diagnosis Date  . Allergy   . Arthritis   . Breast cancer ([place]) 08/2019   right breast IDC  . Cataract   . Complication of anesthesia    unable to void after surgery and had to stay overnight   . Diabetes mellitus without complication ([place])   . Family history of adverse reaction to anesthesia    sister, hard to wake up   . Family history of lung cancer   . Family history of pancreatic cancer   . GERD (gastroesophageal reflux disease)   . Hypertension   . Sleep apnea    wears CPAP nightly  . Thyroid disease   . Ulcerative colitis ([place])     SURGICAL HISTORY: Past Surgical History:  Procedure Laterality Date  . BREAST LUMPECTOMY WITH RADIOACTIVE SEED AND SENTINEL LYMPH NODE BIOPSY  Right 10/14/2019   Procedure: RIGHT BREAST LUMPECTOMY WITH RADIOACTIVE SEED AND SENTINEL LYMPH NODE BIOPSY;  Surgeon: [name], MD;  Location: [facility];  Service: General;  Laterality: Right;  . CHOLECYSTECTOMY    . RE-EXCISION OF BREAST LUMPECTOMY Right 11/11/2019   Procedure: RIGHT RE-EXCISION OF BREAST LUMPECTOMY;  Surgeon: [name], MD;  Location: [facility];  Service: General;  Laterality: Right;  RNFA  . SKIN SURGERY  12/2015   Fatty tumor  removed  . TONSILLECTOMY AND ADENOIDECTOMY  1956    I have reviewed the social history and family history with the patient and they are unchanged from previous note.  ALLERGIES:  is allergic to nickel, sulfa antibiotics, and bydureon [exenatide].  MEDICATIONS:  Current Outpatient Medications  Medication Sig Dispense Refill  . anastrozole (ARIMIDEX) 1 MG tablet Take 1 tablet (1 mg total) by mouth daily. 30 tablet 5  . Biotin 2500 MCG CAPS Take 1 capsule by mouth daily.    Marland Kitchen glucosamine-chondroitin 500-400 MG tablet Take 1 tablet by mouth daily.    . melatonin 5 MG TABS Take 5 mg by mouth at bedtime as needed.    Marland Kitchen aspirin EC 81 MG tablet Take 1 tablet by mouth daily.    Marland Kitchen atorvastatin (LIPITOR) 10 MG tablet Take 1 tablet (10 mg total) by mouth every evening. 90 tablet 3  . Cholecalciferol (VITAMIN D3) 2000 units capsule Take 1 capsule by mouth daily.    . dapagliflozin propanediol (FARXIGA) 10 MG TABS tablet Take 1 tablet (10 mg total) by mouth daily. 90 tablet 3  . famotidine (PEPCID) 40 MG tablet Take 40 mg by mouth 2 (two) times daily.    Marland Kitchen levothyroxine (SYNTHROID) 112 MCG tablet Take 1 tablet (112 mcg total) by mouth daily. 90 tablet 3  . mesalamine (LIALDA) 1.2 g EC tablet Take by mouth. Take 2 tablet in am and 2 tablet in pm    . metFORMIN (GLUCOPHAGE) 1000 MG tablet Take 1 tablet (1,000 mg total) by mouth 2 (two) times daily with a meal. 180 tablet 3  . Misc Natural Products (TURMERIC CURCUMIN) CAPS Take 1 capsule by mouth daily.    . Multiple Vitamin (MULTIVITAMIN) tablet Take 1 tablet by mouth daily.    . ramipril (ALTACE) 10 MG capsule Take 1 capsule (10 mg total) by mouth daily. 90 capsule 3   No current facility-administered medications for this visit.    PHYSICAL EXAMINATION: ECOG PERFORMANCE STATUS: 0 - Asymptomatic  Vitals:   01/29/20 1337  BP: 138/69  Pulse: 72  Resp: 18  Temp: 97.7 F (36.5 C)  SpO2: 99%   Filed Weights   01/29/20 1337  Weight: 197 lb 6.4  oz (89.5 kg)    GENERAL:alert, no distress and comfortable SKIN: skin color, texture, turgor are normal, no rashes or significant lesions EYES: normal, Conjunctiva are pink and non-injected, sclera clear NECK: supple, thyroid normal size, non-tender, without nodularity LYMPH:  no palpable lymphadenopathy in the cervical, axillary  LUNGS: clear to auscultation and percussion with normal breathing effort HEART: regular rate & rhythm and no murmurs and no lower extremity edema ABDOMEN:abdomen soft, non-tender and normal bowel sounds Musculoskeletal:no cyanosis of digits and no clubbing  NEURO: alert & oriented x 3 with fluent speech, no focal motor/sensory deficits Breasts: Breast inspection showed them to be symmetrical with no nipple discharge.  Diffuse skin erythema of right breast secondary to radiation, no skin ulcers or peeling Palpation of the breasts and axilla revealed  no obvious mass that I could appreciate.   LABORATORY DATA:  I have reviewed the data as listed CBC Latest Ref Rng & Units 01/07/2020 09/24/2019 01/01/2019  WBC 3.8 - 10.8 Thousand/uL 5.8 7.5 6.7  Hemoglobin 11.7 - 15.5 g/dL 14.8 15.0 14.6  Hematocrit 35.0 - 45.0 % 44.2 47.3(H) 44.3  Platelets 140 - 400 Thousand/uL 329 323 289.0     CMP Latest Ref Rng & Units 01/07/2020 11/07/2019 10/10/2019  Glucose 65 - 99 mg/dL 119(H) 129(H) 108(H)  BUN 7 - 25 mg/dL 15 14 13   Creatinine 0.60 - 0.93 mg/dL 0.55(L) 0.68 0.65  Sodium 135 - 146 mmol/L 140 138 139  Potassium 3.5 - 5.3 mmol/L 4.6 4.4 4.3  Chloride 98 - 110 mmol/L 102 101 103  CO2 20 - 32 mmol/L 30 28 24   Calcium 8.6 - 10.4 mg/dL 9.9 10.1 9.7  Total Protein 6.1 - 8.1 g/dL 6.6 - -  Total Bilirubin 0.2 - 1.2 mg/dL 0.6 - -  Alkaline Phos 38 - 126 U/L - - -  AST 10 - 35 U/L 12 - -  ALT 6 - 29 U/L 12 - -      RADIOGRAPHIC STUDIES: I have personally reviewed the radiological images as listed and agreed with the findings in the report. No results found.    ASSESSMENT & PLAN:  Michelle Sherman is a 70 y.o. female with    1. 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  -She underwent right lumpectomy with Dr Barry Dienes on 10/14/19 and right breast re-excision on 11/11/19 due to positive margins. Oncotype showed RS 0, given low risk disease I did not recommend chemotherapy.  -To reduce her risk of local recurrence, she completed adjuvant Radiation 12/22/19-01/19/20 --Given the strong ER and PR positivity, I do recommend adjuvant aromatase inhibitor to reduce her risk of cancer recurrence,  The potential benefit and side effects, which includes but not limited to, hot flash, skin and vaginal dryness, metabolic changes ( increased blood glucose, cholesterol, weight, etc.), slightly in increased risk of cardiovascular disease, cataracts, muscular and joint discomfort, osteopenia and osteoporosis, etc, were discussed with her in great details. She is interested, and we'll start in 2-3 weeks  -We also discussed breast cancer surveillance, she will continue annual mammogram, and bone density scan every 2 years.  Routine follow-up with lab and exam every 3 to 6 months for next 5 years, then yearly  -Survivorship in 3 months, follow-up with me in 6 months   2. Genetic Testing  -Based on her family history of cancer, she is eligible for genetic testing. -Her genetics was negative   3. Comorbidities: HTN, DM, Hypothyroidism  -Well controlled on Medications.  -Continue to f/u with PCP for management    4. Bone Health  -Her 08/07/17 DEXA was normal   PLAN:  -I called in anastrozole to her pharmacy, plan to start in 2 to 3 weeks -Survivorship in 3 months -Lab and follow-up in 6 months   No problem-specific Assessment & Plan notes found for this encounter.   Orders Placed This  Encounter  Procedures  . DG Bone Density    Standing Status:   Future    Standing Expiration Date:   01/28/2021    Order Specific Question:   Reason for Exam (SYMPTOM  OR DIAGNOSIS REQUIRED)    Answer:   screening  Order Specific Question:   Preferred imaging location?    Answer:   [facility]   All questions were answered. The patient knows to call the clinic with any problems, questions or concerns. No barriers to learning was detected. The total time spent in the appointment was 30 minutes.     Truitt Merle, MD 01/29/2020   I, Joslyn Devon, am acting as scribe for Truitt Merle, MD.   I have reviewed the above documentation for accuracy and completeness, and I agree with the above.

## 2020-01-29 ENCOUNTER — Encounter: Payer: Self-pay | Admitting: Hematology

## 2020-01-29 ENCOUNTER — Inpatient Hospital Stay: Payer: Medicare Other | Attending: Hematology | Admitting: Hematology

## 2020-01-29 ENCOUNTER — Other Ambulatory Visit: Payer: Self-pay

## 2020-01-29 ENCOUNTER — Ambulatory Visit: Payer: Medicare Other | Attending: General Surgery

## 2020-01-29 VITALS — BP 138/69 | HR 72 | Temp 97.7°F | Resp 18 | Ht 60.0 in | Wt 197.4 lb

## 2020-01-29 DIAGNOSIS — M25511 Pain in right shoulder: Secondary | ICD-10-CM | POA: Diagnosis not present

## 2020-01-29 DIAGNOSIS — Z79811 Long term (current) use of aromatase inhibitors: Secondary | ICD-10-CM | POA: Insufficient documentation

## 2020-01-29 DIAGNOSIS — Z170 Estrogen receptor positive status [ER+]: Secondary | ICD-10-CM | POA: Insufficient documentation

## 2020-01-29 DIAGNOSIS — K219 Gastro-esophageal reflux disease without esophagitis: Secondary | ICD-10-CM | POA: Diagnosis not present

## 2020-01-29 DIAGNOSIS — Z483 Aftercare following surgery for neoplasm: Secondary | ICD-10-CM | POA: Insufficient documentation

## 2020-01-29 DIAGNOSIS — G8929 Other chronic pain: Secondary | ICD-10-CM | POA: Insufficient documentation

## 2020-01-29 DIAGNOSIS — R293 Abnormal posture: Secondary | ICD-10-CM | POA: Diagnosis not present

## 2020-01-29 DIAGNOSIS — E119 Type 2 diabetes mellitus without complications: Secondary | ICD-10-CM | POA: Insufficient documentation

## 2020-01-29 DIAGNOSIS — Z801 Family history of malignant neoplasm of trachea, bronchus and lung: Secondary | ICD-10-CM | POA: Insufficient documentation

## 2020-01-29 DIAGNOSIS — Z79899 Other long term (current) drug therapy: Secondary | ICD-10-CM | POA: Insufficient documentation

## 2020-01-29 DIAGNOSIS — Z7982 Long term (current) use of aspirin: Secondary | ICD-10-CM | POA: Diagnosis not present

## 2020-01-29 DIAGNOSIS — I10 Essential (primary) hypertension: Secondary | ICD-10-CM | POA: Insufficient documentation

## 2020-01-29 DIAGNOSIS — C50411 Malignant neoplasm of upper-outer quadrant of right female breast: Secondary | ICD-10-CM | POA: Diagnosis not present

## 2020-01-29 DIAGNOSIS — E2839 Other primary ovarian failure: Secondary | ICD-10-CM

## 2020-01-29 DIAGNOSIS — G4730 Sleep apnea, unspecified: Secondary | ICD-10-CM | POA: Diagnosis not present

## 2020-01-29 DIAGNOSIS — E039 Hypothyroidism, unspecified: Secondary | ICD-10-CM | POA: Insufficient documentation

## 2020-01-29 DIAGNOSIS — Z923 Personal history of irradiation: Secondary | ICD-10-CM | POA: Insufficient documentation

## 2020-01-29 DIAGNOSIS — Z800 Family history of malignant neoplasm of digestive organs: Secondary | ICD-10-CM | POA: Insufficient documentation

## 2020-01-29 MED ORDER — ANASTROZOLE 1 MG PO TABS: 1.0000 mg | ORAL_TABLET | Freq: Every day | ORAL | 5 refills | Status: DC

## 2020-01-29 NOTE — Therapy (Signed)
[place], [place], [address] Phone: [phone]   Fax:  [phone]  Physical Therapy Treatment  Patient Details  Name: Michelle Sherman MRN: [id number] Date of Birth: [birth year] Referring Provider (PT): Dr. [name]   Encounter Date: 01/29/2020   PT End of Session - 01/29/20 1205    Visit Number 10    Number of Visits 13    Date for PT Re-Evaluation 02/02/20    PT Start Time 1105    PT Stop Time 1200    PT Time Calculation (min) 55 min    Activity Tolerance Patient tolerated treatment well    Behavior During Therapy [facility] for tasks assessed/performed           Past Medical History:  Diagnosis Date  . Allergy   . Arthritis   . Breast cancer ([place]) 08/2019   right breast IDC  . Cataract   . Complication of anesthesia    unable to void after surgery and had to stay overnight   . Diabetes mellitus without complication ([place])   . Family history of adverse reaction to anesthesia    sister, hard to wake up   . Family history of lung cancer   . Family history of pancreatic cancer   . GERD (gastroesophageal reflux disease)   . Hypertension   . Sleep apnea    wears CPAP nightly  . Thyroid disease   . Ulcerative colitis [facility]     Past Surgical History:  Procedure Laterality Date  . BREAST LUMPECTOMY WITH RADIOACTIVE SEED AND SENTINEL LYMPH NODE BIOPSY Right 10/14/2019   Procedure: RIGHT BREAST LUMPECTOMY WITH RADIOACTIVE SEED AND SENTINEL LYMPH NODE BIOPSY;  Surgeon: [name], MD;  Location: [place];  Service: General;  Laterality: Right;  . CHOLECYSTECTOMY    . RE-EXCISION OF BREAST LUMPECTOMY Right 11/11/2019   Procedure: RIGHT RE-EXCISION OF BREAST LUMPECTOMY;  Surgeon: [name], MD;  Location: [place];  Service: General;  Laterality: Right;  RNFA  . SKIN SURGERY  12/2015   Fatty tumor removed  . TONSILLECTOMY AND ADENOIDECTOMY  1956    There  were no vitals filed for this visit.   Subjective Assessment - 01/29/20 1204    Subjective Shoulder is doing really well considering what I did yesterday. My husbands p0wer WC got stuck and I had to move it.  It hurt a little when I did it, but its fine now.    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 She had a right lumpectomy and sentinel node biopsy (2 negative nodes) on 10/14/2019. It is ER/PR positive and HER2 negative with a Ki67 of 5%. She cares for her husband who is in a motorized wheelchair and has bilateral BKA.    Limitations House hold activities    Patient Stated Goals Help with right arm pain,    Currently in Pain? No/denies    Pain Score 0-No pain                             [facility] Adult PT Treatment/Exercise - 01/29/20 0001      Shoulder Exercises: Supine   Horizontal ABduction Strengthening;12 reps    Theraband Level (Shoulder Horizontal ABduction) Level 1 (Yellow)    External Rotation Strengthening;Both;12 reps    Theraband Level (Shoulder External Rotation) Level 1 (Yellow)    Flexion Strengthening;Both;12 reps  Theraband Level (Shoulder Flexion) Level 1 (Yellow)    Diagonals Strengthening;Right;Left;15 reps    Theraband Level (Shoulder Diagonals) Level 1 (Yellow)    Diagonals Limitations supine rhythmic stabs 60/90/120   20 sec ea   Other Supine Exercises alphabet 1#      Shoulder Exercises: Standing   Protraction Strengthening;Both;20 reps   at wall   Extension Strengthening;Both;15 reps    Theraband Level (Shoulder Extension) Level 2 (Red)    Retraction Strengthening;Both;15 reps    Theraband Level (Shoulder Retraction) Level 2 (Red)    Shoulder Elevation Strengthening;Both;10 reps   and flexion 1# x 10   Other Standing Exercises elbow flex 3# 2 x 10    Other Standing Exercises 2 D ball rolls on wall x 20      Iontophoresis   Type of Iontophoresis Dexamethasone     Location Right prox. biceps    Dose 4 mg/ml    Time 4 hours      Manual Therapy   Manual therapy comments redness improved    Joint Mobilization Posterior and inferiorly to Rt shoulder    Passive ROM In Supine to Rt shoulder into flexion, abduction, D2 and IR/er; pt continues with much improved end P/ROM and no c/o pain/discomfort                       PT Long Term Goals - 12/22/19 2147      PT LONG TERM GOAL #1   Title pt will be independent in HEP for right shoulder ROM/strenght    Time 4    Period Weeks    Status New    Target Date 01/19/20      PT LONG TERM GOAL #2   Title Pt will have decreased right shoulder pain by 50% or greater    Time 6    Period Weeks    Status New    Target Date 02/02/20      PT LONG TERM GOAL #3   Title Pt to demo improved strength of R shoulder, to at least 4+/5 to improve ability for reaching, lifting, and IADLS.     Time 6    Period Weeks    Status New    Target Date 02/02/20      PT LONG TERM GOAL #4   Title Pt will have AROM right shoulder WNL when compared with left    Baseline abd 85 A    Time 6    Period Weeks    Status New    Target Date 02/02/20      PT LONG TERM GOAL #5   Title Quick dash will be no greater than 10% to demonstrate improved right UE function    Time 6    Period Weeks    Status New      Additional Long Term Goals   Additional Long Term Goals Yes      PT LONG TERM GOAL #6   Title Pt will  be fit for prophylactic sleeve prn    Time 4    Period Weeks    Status New                 Plan - 01/29/20 1206    Clinical Impression Statement Pt. reports pain 85% improved.  She still notes occasional popping in the shoulder which is not painful.  Pt felt good after exercising in clinic today    Personal Factors and Comorbidities Comorbidity 1  Comorbidities right lumpectomy with SLNB    Examination-Activity Limitations Caring for Others;Lift;Sleep;Reach Overhead     Examination-Participation Restrictions Cleaning    Stability/Clinical Decision Making Stable/Uncomplicated    Rehab Potential Excellent    PT Frequency 2x / week    PT Duration 6 weeks    PT Treatment/Interventions ADLs/Self Care Home Management;Therapeutic exercise;Patient/family education;Iontophoresis 41m/ml Dexamethasone;Neuromuscular re-education;Manual techniques;Manual lymph drainage;Compression bandaging;Passive range of motion;Taping    PT Next Visit Plan recert or DC, review HEP if DC, sleeve?    Consulted and Agree with Plan of Care Patient           Patient will benefit from skilled therapeutic intervention in order to improve the following deficits and impairments:  Decreased knowledge of precautions,Pain,Postural dysfunction,Decreased range of motion,Impaired UE functional use,Decreased activity tolerance,Decreased strength  Visit Diagnosis: Chronic right shoulder pain  Malignant neoplasm of upper-outer quadrant of right breast in female, estrogen receptor positive ([place]  Abnormal posture  Aftercare following surgery for neoplasm     Problem List Patient Active Problem List   Diagnosis Date Noted  . Genetic testing 10/06/2019  . Family history of pancreatic cancer   . Family history of lung cancer   . Malignant neoplasm of upper-outer quadrant of right breast in female, estrogen receptor positive ([place] 09/18/2019  . Incomplete uterovaginal prolapse 04/03/2019  . 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 Plantar fasciitis 05/06/2017  . Controlled type 2 diabetes mellitus without complication, without long-term current use of insulin ([place] 11/01/2016  . Hypertension associated with diabetes ([place] 11/01/2016  . Severe obesity (BMI 35.0-35.9 with comorbidity) ([place] 11/01/2016  . Combined hyperlipidemia associated with type 2 diabetes mellitus ([place] 01/15/2012  . Allergic rhinitis 10/05/2011  . Ulcerative colitis without complications ([place] 066/59/9357 . Diverticulosis  of colon 10/17/2010  . Internal hemorrhoids 10/17/2010  . Acquired hypothyroidism 10/04/2010  . Osteoarthrosis of knee 10/04/2010  . OSA on CPAP 06/28/2010    RClaris Pong1/06/2020, 12:11 PM  [place] [address]Phone: [phone]  Fax:  [phone] Name: KTAJAI SUDERMRN: [id number] of Birth: [birth year]RCheral Almas PT 01/29/20 12:13 PM

## 2020-02-02 ENCOUNTER — Ambulatory Visit: Payer: Medicare Other

## 2020-02-02 ENCOUNTER — Other Ambulatory Visit: Payer: Self-pay

## 2020-02-02 DIAGNOSIS — G8929 Other chronic pain: Secondary | ICD-10-CM

## 2020-02-02 DIAGNOSIS — C50411 Malignant neoplasm of upper-outer quadrant of right female breast: Secondary | ICD-10-CM | POA: Diagnosis not present

## 2020-02-02 DIAGNOSIS — Z170 Estrogen receptor positive status [ER+]: Secondary | ICD-10-CM | POA: Diagnosis not present

## 2020-02-02 DIAGNOSIS — Z483 Aftercare following surgery for neoplasm: Secondary | ICD-10-CM

## 2020-02-02 DIAGNOSIS — R293 Abnormal posture: Secondary | ICD-10-CM

## 2020-02-02 DIAGNOSIS — M25511 Pain in right shoulder: Secondary | ICD-10-CM | POA: Diagnosis not present

## 2020-02-02 NOTE — Therapy (Signed)
[place], [place], [address] Phone: [phone]   Fax:  [phone]  Physical Therapy Treatment  Patient Details  Name: Michelle Sherman MRN: [id number] Date of Birth: 1950-03-28 Referring Provider (PT): Dr. [name]   Encounter Date: 02/02/2020   PT End of Session - 02/02/20 2108    Visit Number 11    Number of Visits 20    Date for PT Re-Evaluation 03/01/20    PT Start Time 1302    PT Stop Time 1400    PT Time Calculation (min) 58 min    Activity Tolerance Patient tolerated treatment well    Behavior During Therapy [facility] for tasks assessed/performed           Past Medical History:  Diagnosis Date  . Allergy   . Arthritis   . Breast cancer ([place]) 08/2019   right breast IDC  . Cataract   . Complication of anesthesia    unable to void after surgery and had to stay overnight   . Diabetes mellitus without complication ([place])   . Family history of adverse reaction to anesthesia    sister, hard to wake up   . Family history of lung cancer   . Family history of pancreatic cancer   . GERD (gastroesophageal reflux disease)   . Hypertension   . Sleep apnea    wears CPAP nightly  . Thyroid disease   . Ulcerative colitis [facility])     Past Surgical History:  Procedure Laterality Date  . BREAST LUMPECTOMY WITH RADIOACTIVE SEED AND SENTINEL LYMPH NODE BIOPSY Right 10/14/2019   Procedure: RIGHT BREAST LUMPECTOMY WITH RADIOACTIVE SEED AND SENTINEL LYMPH NODE BIOPSY;  Surgeon: [name], MD;  Location: [place];  Service: General;  Laterality: Right;  . CHOLECYSTECTOMY    . RE-EXCISION OF BREAST LUMPECTOMY Right 11/11/2019   Procedure: RIGHT RE-EXCISION OF BREAST LUMPECTOMY;  Surgeon: [name], MD;  Location: [place];  Service: General;  Laterality: Right;  RNFA  . SKIN SURGERY  12/2015   Fatty tumor removed  . TONSILLECTOMY AND ADENOIDECTOMY  1956     There were no vitals filed for this visit.   Subjective Assessment - 02/02/20 1259    Subjective Right shoulder started bothering me a little on Saturday when I move certain ways.  Its tender over the proximal biceps area.  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 She had a right lumpectomy and sentinel node biopsy (2 negative nodes) on 10/14/2019. It is ER/PR positive and HER2 negative with a Ki67 of 5%. She cares for her husband who is in a motorized wheelchair and has bilateral BKA.    Patient Stated Goals Help with right arm pain,    Currently in Pain? Yes    Pain Score 5     Pain Location Shoulder    Pain Orientation Right    Pain Descriptors / Indicators Tender;Tightness    Pain Onset More than a month ago    Pain Frequency Intermittent              [facility] PT Assessment - 02/02/20 0001      Assessment   Medical Diagnosis s/p right lumpectomy and SLNB    Referring Provider (PT) Dr. [name]    Onset Date/Surgical Date 10/14/19    Hand Dominance Right  Precautions   Precaution Comments recent surgery and right arm lymphedema risk      AROM   Right/Left Shoulder Right    Right Shoulder Extension 54 Degrees    Right Shoulder Flexion 148 Degrees    Right Shoulder ABduction 158 Degrees    Right Shoulder Internal Rotation 60 Degrees    Right Shoulder External Rotation 83 Degrees      Strength   Right Shoulder Flexion 4/5   mild pain with resistance   Right Shoulder ABduction 4/5  Mild pain with resistance   Right Shoulder Internal Rotation 5/5    Right Shoulder External Rotation 4+/5      Palpation   Palpation comment --   tender at supraspinatus insertion and proximal biceps/tight      Hawkins-Kennedy test   Findings Positive    Side Right    Comments mild      Empty Can test   Findings Positive    Side Right              LYMPHEDEMA/ONCOLOGY QUESTIONNAIRE - 02/02/20 0001      Right Upper Extremity Lymphedema   10 cm Proximal to Olecranon Process 35.6 cm    Olecranon Process 28.7 cm    10 cm Proximal to Ulnar Styloid Process 24.9 cm    Just Proximal to Ulnar Styloid Process 16.5 cm    Across Hand at [employer] 19 cm    At [place] of 2nd Digit 6 cm           L-DEX FLOWSHEETS - 02/02/20 2100      L-DEX LYMPHEDEMA SCREENING   Measurement Type Unilateral    L-DEX MEASUREMENT EXTREMITY Upper Extremity    POSITION  Standing    DOMINANT SIDE Right    At Risk Side Right    BASELINE SCORE (UNILATERAL) -2.2    L-DEX SCORE (UNILATERAL) -2.6    VALUE CHANGE (UNILAT) -0.4             Quick Dash - 02/02/20 0001    Open a tight or new jar Mild difficulty    Do heavy household chores (wash walls, wash floors) Moderate difficulty    Carry a shopping bag or briefcase Mild difficulty    Wash your back Mild difficulty    Use a knife to cut food No difficulty    Recreational activities in which you take some force or impact through your arm, shoulder, or hand (golf, hammering, tennis) Mild difficulty    During the past week, to what extent has your arm, shoulder or hand problem interfered with your normal social activities with family, friends, neighbors, or groups? Not at all    During the past week, to what extent has your arm, shoulder or hand problem limited your work or other regular daily activities Not at all    Arm, shoulder, or hand pain. Mild    Tingling (pins and needles) in your arm, shoulder, or hand None    Difficulty Sleeping Mild difficulty    DASH Score 18.18 %                  [facility] Adult PT Treatment/Exercise - 02/02/20 0001      Shoulder Exercises: Pulleys   Flexion 2 minutes    ABduction 2 minutes                  PT Education - 02/02/20 2107    Education Details Pt to set up next SOZO screen  3 months from today    Person(s) Educated Patient    Methods  Explanation    Comprehension Verbalized understanding               PT Long Term Goals - 02/02/20 1327      PT LONG TERM GOAL #1   Title pt will be independent in HEP for right shoulder ROM/strenght    Time 4    Period Weeks    Status Achieved      PT LONG TERM GOAL #2   Title Pt will have decreased right shoulder pain by 50% or greater    Period Weeks    Status Achieved      PT LONG TERM GOAL #3   Title Pt to demo improved strength of R shoulder, to at least 4+/5 to improve ability for reaching, lifting, and IADLS.     Period Weeks    Status On-going      PT LONG TERM GOAL #4   Title Pt will have AROM right shoulder WNL when compared with left    Period Weeks    Status Partially Met      PT LONG TERM GOAL #5   Title Quick dash will be no greater than 10% to demonstrate improved right UE function    Time 6    Period Weeks    Status On-going      PT LONG TERM GOAL #6   Title Pt will  be fit for prophylactic sleeve prn    Time 4    Period Weeks    Status On-going                 Plan - 02/02/20 2110    Clinical Impression Statement Pt reports overall pain atleast 70% better despite a little set back over the weekend.  Her quick dash improved by 9%, and her shoulder abduction is with significant improvement. AROM Right shoulder flex and ER are still slightly limited compared to the left.  She has improved strength throughout but continues with mildly positive impingement and empty can test.  she has continued tenderness to right proximal biceps since her set back this weekend.  She had very good results with her SOZO screen and has no significant differences in UE  circumferential measuring. She will benefit from several more weeks of Ionto and strengthening to continue to improve Strength.  We also discussed option of seeing an orthopedist.    Personal Factors and Comorbidities Comorbidity 1    Comorbidities right lumpectomy with SLNB    Examination-Activity  Limitations Caring for Others;Lift;Sleep;Reach Overhead    Examination-Participation Restrictions Cleaning    Stability/Clinical Decision Making Stable/Uncomplicated    Rehab Potential Excellent    PT Frequency 2x / week   will likely decrease to 1x per week after this week   PT Duration 4 weeks    PT Treatment/Interventions ADLs/Self Care Home Management;Therapeutic exercise;Patient/family education;Iontophoresis 22m/ml Dexamethasone;Neuromuscular re-education;Manual techniques;Manual lymph drainage;Compression bandaging;Passive range of motion;Taping    PT Next Visit Plan Continue Strengthening, PROM, NM techniques, Ionto, may decrease to 1 x per week next week if doing well    PT Home Exercise Plan Post op shoulder ROM HEP; isometrics for Rt shoulder; supine A/ROM for Rt shoulder over towel roll and modified downward dog, Scapular retraction and shoulder extension with red TBand, supine scapular series with yellow theraband, progress to red when able    Consulted and Agree with Plan of Care Patient  Patient will benefit from skilled therapeutic intervention in order to improve the following deficits and impairments:  Decreased knowledge of precautions,Pain,Postural dysfunction,Decreased range of motion,Impaired UE functional use,Decreased activity tolerance,Decreased strength  Visit Diagnosis: Chronic right shoulder pain  Malignant neoplasm of upper-outer quadrant of right breast in female, estrogen receptor positive ([place])  Abnormal posture  Aftercare following surgery for neoplasm     Problem List Patient Active Problem List   Diagnosis Date Noted  . Genetic testing 10/06/2019  . Family history of pancreatic cancer   . Family history of lung cancer   . Malignant neoplasm of upper-outer quadrant of right breast in female, estrogen receptor positive ([place]) 09/18/2019  . Incomplete uterovaginal prolapse 04/03/2019  . 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 Plantar fasciitis  05/06/2017  . Controlled type 2 diabetes mellitus without complication, without long-term current use of insulin ([place]) 11/01/2016  . Hypertension associated with diabetes ([place]) 11/01/2016  . Severe obesity (BMI 35.0-35.9 with comorbidity) ([place]) 11/01/2016  . Combined hyperlipidemia associated with type 2 diabetes mellitus ([place]) 01/15/2012  . Allergic rhinitis 10/05/2011  . Ulcerative colitis without complications ([place]) 95/28/4132  . Diverticulosis of colon 10/17/2010  . Internal hemorrhoids 10/17/2010  . Acquired hypothyroidism 10/04/2010  . Osteoarthrosis of knee 10/04/2010  . OSA on CPAP 06/28/2010    Claris Pong 02/02/2020, 9:20 PM  [place], [place], [address] Phone: [phone]   Fax:  [phone]  Name: JAMERA VANLOAN MRN: [id number] Date of Birth: [birth year]  Cheral Almas, PT 02/02/20 9:21 PM

## 2020-02-05 ENCOUNTER — Ambulatory Visit: Payer: Medicare Other

## 2020-02-05 ENCOUNTER — Other Ambulatory Visit: Payer: Self-pay

## 2020-02-05 DIAGNOSIS — R293 Abnormal posture: Secondary | ICD-10-CM | POA: Diagnosis not present

## 2020-02-05 DIAGNOSIS — Z170 Estrogen receptor positive status [ER+]: Secondary | ICD-10-CM | POA: Diagnosis not present

## 2020-02-05 DIAGNOSIS — Z483 Aftercare following surgery for neoplasm: Secondary | ICD-10-CM | POA: Diagnosis not present

## 2020-02-05 DIAGNOSIS — C50411 Malignant neoplasm of upper-outer quadrant of right female breast: Secondary | ICD-10-CM | POA: Diagnosis not present

## 2020-02-05 DIAGNOSIS — G8929 Other chronic pain: Secondary | ICD-10-CM

## 2020-02-05 DIAGNOSIS — M25511 Pain in right shoulder: Secondary | ICD-10-CM | POA: Diagnosis not present

## 2020-02-05 NOTE — Therapy (Signed)
[place], [place], [address] Phone: [phone]   Fax:  [phone]  Physical Therapy Treatment  Patient Details  Name: Michelle Sherman MRN: [id number] Date of Birth: [birth year] Referring Provider (PT): Dr. [name]   Encounter Date: 02/05/2020   PT End of Session - 02/05/20 1458    Visit Number 12    Number of Visits 20    Date for PT Re-Evaluation 03/01/20    PT Start Time 3570    PT Stop Time 1456    PT Time Calculation (min) 57 min    Activity Tolerance Patient tolerated treatment well    Behavior During Therapy [facility] for tasks assessed/performed           Past Medical History:  Diagnosis Date  . Allergy   . Arthritis   . Breast cancer ([place]) 08/2019   right breast IDC  . Cataract   . Complication of anesthesia    unable to void after surgery and had to stay overnight   . Diabetes mellitus without complication ([place])   . Family history of adverse reaction to anesthesia    sister, hard to wake up   . Family history of lung cancer   . Family history of pancreatic cancer   . GERD (gastroesophageal reflux disease)   . Hypertension   . Sleep apnea    wears CPAP nightly  . Thyroid disease   . Ulcerative colitis [facility])     Past Surgical History:  Procedure Laterality Date  . BREAST LUMPECTOMY WITH RADIOACTIVE SEED AND SENTINEL LYMPH NODE BIOPSY Right 10/14/2019   Procedure: RIGHT BREAST LUMPECTOMY WITH RADIOACTIVE SEED AND SENTINEL LYMPH NODE BIOPSY;  Surgeon: [name], MD;  Location: [place];  Service: General;  Laterality: Right;  . CHOLECYSTECTOMY    . RE-EXCISION OF BREAST LUMPECTOMY Right 11/11/2019   Procedure: RIGHT RE-EXCISION OF BREAST LUMPECTOMY;  Surgeon: [name], MD;  Location: [place];  Service: General;  Laterality: Right;  RNFA  . SKIN SURGERY  12/2015   Fatty tumor removed  . TONSILLECTOMY AND ADENOIDECTOMY  1956     There were no vitals filed for this visit.   Subjective Assessment - 02/05/20 1359    Subjective Seems to be a little better.  It has been twinging a little and it may be from rotating the bed even though it didn't hurt at the time.  Did fine after last visit.    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 She had a right lumpectomy and sentinel node biopsy (2 negative nodes) on 10/14/2019. It is ER/PR positive and HER2 negative with a Ki67 of 5%. She cares for her husband who is in a motorized wheelchair and has bilateral BKA.    Patient Stated Goals Help with right arm pain,    Currently in Pain? No/denies    Pain Score 0-No pain    Pain Orientation Right    Pain Frequency Intermittent    Multiple Pain Sites No                             [facility] Adult PT Treatment/Exercise - 02/05/20 0001      Shoulder Exercises: Supine   Horizontal ABduction Strengthening;Both;15 reps    Theraband Level (Shoulder Horizontal ABduction) Level 1 (Yellow)    External Rotation Strengthening;Both;15 reps    Flexion Strengthening;Both;15  reps    Diagonals Strengthening;Both;10 reps    Theraband Level (Shoulder Diagonals) Level 1 (Yellow)    Other Supine Exercises alphabet 2#    Other Supine Exercises supine rhythmic stabs 60/90/120 x 20 sec      Shoulder Exercises: Standing   Protraction Strengthening;Both;20 reps   at wall   Shoulder Elevation Strengthening;Both;10 reps   and flexion 1# x 15   Other Standing Exercises elbow flex 3# 2 x 10    Other Standing Exercises 2 D ball rolls on wall x 20      Iontophoresis   Type of Iontophoresis Dexamethasone    Location Right prox. biceps    Dose 4 mg/ml    Time 4 hours      Manual Therapy   Joint Mobilization Posterior and inferiorly to Rt shoulder    Passive ROM In Supine to Rt shoulder into flexion, abduction, D2 and IR/er; pt continues with much improved end  P/ROM and no c/o pain/discomfort                       PT Long Term Goals - 02/02/20 1327      PT LONG TERM GOAL #1   Title pt will be independent in HEP for right shoulder ROM/strenght    Time 4    Period Weeks    Status Achieved      PT LONG TERM GOAL #2   Title Pt will have decreased right shoulder pain by 50% or greater    Period Weeks    Status Achieved      PT LONG TERM GOAL #3   Title Pt to demo improved strength of R shoulder, to at least 4+/5 to improve ability for reaching, lifting, and IADLS.     Period Weeks    Status On-going      PT LONG TERM GOAL #4   Title Pt will have AROM right shoulder WNL when compared with left    Period Weeks    Status Partially Met      PT LONG TERM GOAL #5   Title Quick dash will be no greater than 10% to demonstrate improved right UE function    Time 6    Period Weeks    Status On-going      PT LONG TERM GOAL #6   Title Pt will  be fit for prophylactic sleeve prn    Time 4    Period Weeks    Status On-going                 Plan - 02/05/20 1459    Clinical Impression Statement pt did well with all exercises in clinic except for right shoulder diagonal starting with right UE IR.  Modified to take her out of impingement position with thumb up and she did without pain.  She was able to progress repetitions with nearly all exs and used good form.  Mild redness at Ionto site but able to perform again today.  Pt will remove if having any discomfort.    Personal Factors and Comorbidities Comorbidity 1    Comorbidities right lumpectomy with SLNB    Examination-Activity Limitations Caring for Others;Lift;Sleep;Reach Overhead    Examination-Participation Restrictions Cleaning    Stability/Clinical Decision Making Stable/Uncomplicated    Rehab Potential Excellent    PT Frequency 2x / week    PT Duration 4 weeks    PT Treatment/Interventions ADLs/Self Care Home Management;Therapeutic exercise;Patient/family  education;Iontophoresis 53m/ml Dexamethasone;Neuromuscular re-education;Manual techniques;Manual  lymph drainage;Compression bandaging;Passive range of motion;Taping    PT Next Visit Plan Continue Strengthening, PROM, NM techniques, Ionto, may decrease to 1 x per week next week if doing well    PT Home Exercise Plan Post op shoulder ROM HEP; isometrics for Rt shoulder; supine A/ROM for Rt shoulder over towel roll and modified downward dog, Scapular retraction and shoulder extension with red TBand, supine scapular series with yellow theraband, progress to red when able    Consulted and Agree with Plan of Care Patient           Patient will benefit from skilled therapeutic intervention in order to improve the following deficits and impairments:  Decreased knowledge of precautions,Pain,Postural dysfunction,Decreased range of motion,Impaired UE functional use,Decreased activity tolerance,Decreased strength  Visit Diagnosis: Chronic right shoulder pain  Malignant neoplasm of upper-outer quadrant of right breast in female, estrogen receptor positive (HCC)  Abnormal posture  Aftercare following surgery for neoplasm     Problem List Patient Active Problem List   Diagnosis Date Noted  . Genetic testing 10/06/2019  . Family history of pancreatic cancer   . Family history of lung cancer   . Malignant neoplasm of upper-outer quadrant of right breast in female, estrogen receptor positive ([place]) 09/18/2019  . Incomplete uterovaginal prolapse 04/03/2019  . 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 Plantar fasciitis 05/06/2017  . Controlled type 2 diabetes mellitus without complication, without long-term current use of insulin ([place]) 11/01/2016  . Hypertension associated with diabetes ([place]) 11/01/2016  . Severe obesity (BMI 35.0-35.9 with comorbidity) ([place]) 11/01/2016  . Combined hyperlipidemia associated with type 2 diabetes mellitus ([place]) 01/15/2012  . Allergic rhinitis 10/05/2011  . Ulcerative  colitis without complications ([place]) 44/17/1278  . Diverticulosis of colon 10/17/2010  . Internal hemorrhoids 10/17/2010  . Acquired hypothyroidism 10/04/2010  . Osteoarthrosis of knee 10/04/2010  . OSA on CPAP 06/28/2010    Claris Pong 02/05/2020, 3:03 PM  [place], [place], [address] Phone: [phone]   Fax:  [phone]  Name: Michelle Sherman MRN: [id number] Date of Birth: 02-04-1950  Cheral Almas, PT 02/05/20 3:04 PM

## 2020-02-09 ENCOUNTER — Ambulatory Visit: Payer: Medicare Other

## 2020-02-12 ENCOUNTER — Other Ambulatory Visit: Payer: Medicare Other

## 2020-02-12 ENCOUNTER — Ambulatory Visit: Payer: Medicare Other

## 2020-02-12 DIAGNOSIS — Z20822 Contact with and (suspected) exposure to covid-19: Secondary | ICD-10-CM

## 2020-02-14 LAB — NOVEL CORONAVIRUS, NAA: SARS-CoV-2, NAA: NOT DETECTED

## 2020-02-14 LAB — SARS-COV-2, NAA 2 DAY TAT

## 2020-02-16 ENCOUNTER — Ambulatory Visit
Admission: RE | Admit: 2020-02-16 | Discharge: 2020-02-16 | Disposition: A | Discharge status: Home / Self Care | Payer: Medicare Other | Source: Ambulatory Visit | Attending: Radiation Oncology | Admitting: Radiation Oncology

## 2020-02-16 ENCOUNTER — Other Ambulatory Visit: Payer: Self-pay

## 2020-02-16 DIAGNOSIS — C50411 Malignant neoplasm of upper-outer quadrant of right female breast: Secondary | ICD-10-CM | POA: Insufficient documentation

## 2020-02-16 DIAGNOSIS — Z170 Estrogen receptor positive status [ER+]: Secondary | ICD-10-CM | POA: Insufficient documentation

## 2020-02-16 NOTE — Progress Notes (Signed)
  Radiation Oncology         [phone] ________________________________  Name: Michelle Sherman MRN: [id number]  Date of Service: 02/16/2020  DOB: 09/18/1950  Post Treatment Telephone Note  Diagnosis:   Stage IA, pT1bN0M0 grade 1, ER/PR positive invasive ductal carcinoma of the right breast.  Interval Since Last Radiation: 4 weeks   12/22/19-01/19/20: The right breast was treated to 42.56 Gy in 16 fractions followed by an 8 Gy boost in 4 fractions.   Narrative:  The patient was contacted today for routine follow-up. During treatment she did very well with radiotherapy and did not have significant desquamation.   Impression/Plan: 1. Stage IA, pT1bN0M0 grade 1, ER/PR positive invasive ductal carcinoma of the right breast. I was unable to reach the patient by phone. I left her a voicemail and on it, I discussed that we would be happy to continue to follow her as needed, but she will also continue to follow up with Dr. Burr Medico in medical oncology. She was counseled on skin care as well as measures to avoid sun exposure to this area.      Carola Rhine, PAC

## 2020-02-17 NOTE — Progress Notes (Signed)
  Radiation Oncology         [phone] ________________________________  Name: Michelle Sherman MRN: [id number]  Date: 01/19/2020  DOB: 26-May-1950  End of Treatment Note  Diagnosis:   right-sided breast cancer     Indication for treatment:  Curative       Radiation treatment dates:   12/22/19 - 01/19/20  Site/dose:   The patient initially received a dose of 42.56 Gy in 16 fractions to the breast using whole-breast tangent fields. This was delivered using a 3-D conformal technique. The patient then received a boost to the seroma. This delivered an additional 8 Gy in 52factions using a 3 field photon technique due to the depth of the seroma. The total dose was 50.56 Gy.   Narrative: The patient tolerated radiation treatment relatively well.   The patient had some expected skin irritation as she progressed during treatment.    Plan: The patient has completed radiation treatment. The patient will return to radiation oncology clinic for routine followup in one month. I advised the patient to call or return sooner if they have any questions or concerns related to their recovery or treatment. ________________________________  JJodelle Gross M.D., Ph.D.

## 2020-02-18 ENCOUNTER — Ambulatory Visit: Payer: Medicare Other

## 2020-02-18 ENCOUNTER — Other Ambulatory Visit: Payer: Self-pay

## 2020-02-18 DIAGNOSIS — G8929 Other chronic pain: Secondary | ICD-10-CM

## 2020-02-18 DIAGNOSIS — C50411 Malignant neoplasm of upper-outer quadrant of right female breast: Secondary | ICD-10-CM | POA: Diagnosis not present

## 2020-02-18 DIAGNOSIS — Z483 Aftercare following surgery for neoplasm: Secondary | ICD-10-CM

## 2020-02-18 DIAGNOSIS — M25511 Pain in right shoulder: Secondary | ICD-10-CM

## 2020-02-18 DIAGNOSIS — Z170 Estrogen receptor positive status [ER+]: Secondary | ICD-10-CM

## 2020-02-18 DIAGNOSIS — R293 Abnormal posture: Secondary | ICD-10-CM

## 2020-02-18 NOTE — Therapy (Signed)
[place], [place], [address] Phone: [phone]   Fax:  [phone]  Physical Therapy Treatment  Patient Details  Name: Michelle Sherman MRN: [id number] Date of Birth: October 05, 1950 Referring Provider (PT): Dr. [name]   Encounter Date: 02/18/2020   PT End of Session - 02/18/20 1400    Visit Number 12    Number of Visits 20    Date for PT Re-Evaluation 03/01/20    PT Start Time 1300    PT Stop Time 1355    PT Time Calculation (min) 55 min    Activity Tolerance Patient tolerated treatment well    Behavior During Therapy [facility] for tasks assessed/performed           Past Medical History:  Diagnosis Date  . Allergy   . Arthritis   . Breast cancer ([place]) 08/2019   right breast IDC  . Cataract   . Complication of anesthesia    unable to void after surgery and had to stay overnight   . Diabetes mellitus without complication ([place])   . Family history of adverse reaction to anesthesia    sister, hard to wake up   . Family history of lung cancer   . Family history of pancreatic cancer   . GERD (gastroesophageal reflux disease)   . Hypertension   . Sleep apnea    wears CPAP nightly  . Thyroid disease   . Ulcerative colitis [facility])     Past Surgical History:  Procedure Laterality Date  . BREAST LUMPECTOMY WITH RADIOACTIVE SEED AND SENTINEL LYMPH NODE BIOPSY Right 10/14/2019   Procedure: RIGHT BREAST LUMPECTOMY WITH RADIOACTIVE SEED AND SENTINEL LYMPH NODE BIOPSY;  Surgeon: [name], MD;  Location: [place];  Service: General;  Laterality: Right;  . CHOLECYSTECTOMY    . RE-EXCISION OF BREAST LUMPECTOMY Right 11/11/2019   Procedure: RIGHT RE-EXCISION OF BREAST LUMPECTOMY;  Surgeon: [name], MD;  Location: [place];  Service: General;  Laterality: Right;  RNFA  . SKIN SURGERY  12/2015   Fatty tumor removed  . TONSILLECTOMY AND ADENOIDECTOMY  1956     There were no vitals filed for this visit.   Subjective Assessment - 02/18/20 1254    Subjective Shoulder has been doing very well.  Hardly any pain, just a twinge once in a while.  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 She had a right lumpectomy and sentinel node biopsy (2 negative nodes) on 10/14/2019. It is ER/PR positive and HER2 negative with a Ki67 of 5%. She cares for her husband who is in a motorized wheelchair and has bilateral BKA.    Limitations House hold activities    Patient Stated Goals Help with right arm pain,    Currently in Pain? No/denies    Pain Score 0-No pain                             [facility] Adult PT Treatment/Exercise - 02/18/20 0001      Shoulder Exercises: Supine   Horizontal ABduction Strengthening;Both    Theraband Level (Shoulder Horizontal ABduction) Level 2 (Red)    Diagonals Strengthening;10 reps;Right;Left    Theraband Level (Shoulder Diagonals) Level 2 (Red)    Other Supine Exercises supine rhythmic stabs 60/90/120 x 20  sec      Shoulder Exercises: Standing   Protraction Strengthening;Both;20 reps   at wall   External Rotation Strengthening;Both;10 reps    Theraband Level (Shoulder External Rotation) Level 2 (Red)    Extension Strengthening;Both;15 reps    Theraband Level (Shoulder Extension) Level 2 (Red)    Retraction Strengthening;Both;15 reps;10 reps    Theraband Level (Shoulder Retraction) Level 2 (Red)    Shoulder Elevation Strengthening;Both;15 reps   and flexion 1# x 15   Other Standing Exercises elbow flex 3# 2 x 10    Other Standing Exercises 2 D ball rolls on wall x 20      Shoulder Exercises: Pulleys   Flexion 2 minutes    ABduction 2 minutes      Iontophoresis   Type of Iontophoresis Dexamethasone    Location Right prox. biceps    Dose 4 mg/ml    Time 4  hours      Manual Therapy   Joint Mobilization Posterior and inferiorly to Rt shoulder    Passive ROM In Supine to Rt shoulder into flexion, abduction, D2 and IR/er; pt continues with much improved end P/ROM and no c/o pain/discomfort                       PT Long Term Goals - 02/02/20 1327      PT LONG TERM GOAL #1   Title pt will be independent in HEP for right shoulder ROM/strenght    Time 4    Period Weeks    Status Achieved      PT LONG TERM GOAL #2   Title Pt will have decreased right shoulder pain by 50% or greater    Period Weeks    Status Achieved      PT LONG TERM GOAL #3   Title Pt to demo improved strength of R shoulder, to at least 4+/5 to improve ability for reaching, lifting, and IADLS.     Period Weeks    Status On-going      PT LONG TERM GOAL #4   Title Pt will have AROM right shoulder WNL when compared with left    Period Weeks    Status Partially Met      PT LONG TERM GOAL #5   Title Quick dash will be no greater than 10% to demonstrate improved right UE function    Time 6    Period Weeks    Status On-going      PT LONG TERM GOAL #6   Title Pt will  be fit for prophylactic sleeve prn    Time 4    Period Weeks    Status On-going                 Plan - 02/18/20 1510    Clinical Impression Statement Pt has not been seen in therapy for nearly 2 weeks secondary to weather and being exposed to Covid, but with negative test.  Her shoulder has been doing very well with only intermittent twinges now and again.  she has been compliant with HEP.  She has progressed to red Theraband without complication.    Personal Factors and Comorbidities Comorbidity 1    Comorbidities right lumpectomy with SLNB    Examination-Activity Limitations Caring for Others;Lift;Sleep;Reach Overhead    Examination-Participation Restrictions Cleaning    Rehab Potential Excellent    PT Frequency 2x / week    PT Duration 4 weeks    PT Treatment/Interventions  ADLs/Self Care Home Management;Therapeutic exercise;Patient/family education;Iontophoresis 58m/ml Dexamethasone;Neuromuscular re-education;Manual techniques;Manual lymph drainage;Compression bandaging;Passive range of motion;Taping    PT Next Visit Plan Continue Strengthening, PROM, NM techniques, Ionto, may decrease to 1 x per week next week if doing well    PT Home Exercise Plan Post op shoulder ROM HEP; isometrics for Rt shoulder; supine A/ROM for Rt shoulder over towel roll and modified downward dog, Scapular retraction and shoulder extension with red TBand, supine scapular series with red    Consulted and Agree with Plan of Care Patient           Patient will benefit from skilled therapeutic intervention in order to improve the following deficits and impairments:  Decreased knowledge of precautions,Pain,Postural dysfunction,Decreased range of motion,Impaired UE functional use,Decreased activity tolerance,Decreased strength  Visit Diagnosis: Chronic right shoulder pain  Malignant neoplasm of upper-outer quadrant of right breast in female, estrogen receptor positive (HCC)  Abnormal posture  Aftercare following surgery for neoplasm     Problem List Patient Active Problem List   Diagnosis Date Noted  . Genetic testing 10/06/2019  . Family history of pancreatic cancer   . Family history of lung cancer   . Malignant neoplasm of upper-outer quadrant of right breast in female, estrogen receptor positive ([place] 09/18/2019  . Incomplete uterovaginal prolapse 04/03/2019  . 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 Plantar fasciitis 05/06/2017  . Controlled type 2 diabetes mellitus without complication, without long-term current use of insulin ([place] 11/01/2016  . Hypertension associated with diabetes ([place] 11/01/2016  . Severe obesity (BMI 35.0-35.9 with comorbidity) ([place] 11/01/2016  . Combined hyperlipidemia associated with type 2 diabetes mellitus ([place] 01/15/2012  . Allergic rhinitis  10/05/2011  . Ulcerative colitis without complications ([place] 038/10/1749 . Diverticulosis of colon 10/17/2010  . Internal hemorrhoids 10/17/2010  . Acquired hypothyroidism 10/04/2010  . Osteoarthrosis of knee 10/04/2010  . OSA on CPAP 06/28/2010    RClaris Pong1/26/2022, 3:25 PM  [place] [id number]: [phone]  Fax:  [phone] Name: KSKYA MCCULLUMMRN: [id number] of Birth: 7Aug 14, 1952 RCheral Almas PT 02/18/20 3:26 PM

## 2020-02-25 ENCOUNTER — Other Ambulatory Visit: Payer: Self-pay

## 2020-02-25 ENCOUNTER — Ambulatory Visit: Payer: Medicare Other | Attending: General Surgery

## 2020-02-25 DIAGNOSIS — Z483 Aftercare following surgery for neoplasm: Secondary | ICD-10-CM | POA: Diagnosis not present

## 2020-02-25 DIAGNOSIS — C50411 Malignant neoplasm of upper-outer quadrant of right female breast: Secondary | ICD-10-CM | POA: Diagnosis not present

## 2020-02-25 DIAGNOSIS — R293 Abnormal posture: Secondary | ICD-10-CM | POA: Insufficient documentation

## 2020-02-25 DIAGNOSIS — Z170 Estrogen receptor positive status [ER+]: Secondary | ICD-10-CM | POA: Diagnosis not present

## 2020-02-25 DIAGNOSIS — M25511 Pain in right shoulder: Secondary | ICD-10-CM | POA: Insufficient documentation

## 2020-02-25 DIAGNOSIS — G8929 Other chronic pain: Secondary | ICD-10-CM | POA: Diagnosis not present

## 2020-02-25 NOTE — Therapy (Signed)
[place], [place], [address] Phone: [phone]   Fax:  [phone]  Physical Therapy Treatment  Patient Details  Name: Michelle Sherman MRN: [id number] Date of Birth: May 16, 1950 Referring Provider (PT): Dr. [name]   Encounter Date: 02/25/2020   PT End of Session - 02/25/20 1353    Visit Number 13    Number of Visits 20    Date for PT Re-Evaluation 03/01/20    PT Start Time 1310    PT Stop Time 1350    PT Time Calculation (min) 40 min    Activity Tolerance Patient tolerated treatment well    Behavior During Therapy [facility] for tasks assessed/performed           Past Medical History:  Diagnosis Date  . Allergy   . Arthritis   . Breast cancer ([employer]) 08/2019   right breast IDC  . Cataract   . Complication of anesthesia    unable to void after surgery and had to stay overnight   . Diabetes mellitus without complication ([place])   . Family history of adverse reaction to anesthesia    sister, hard to wake up   . Family history of lung cancer   . Family history of pancreatic cancer   . GERD (gastroesophageal reflux disease)   . Hypertension   . Sleep apnea    wears CPAP nightly  . Thyroid disease   . Ulcerative colitis [facility])     Past Surgical History:  Procedure Laterality Date  . BREAST LUMPECTOMY WITH RADIOACTIVE SEED AND SENTINEL LYMPH NODE BIOPSY Right 10/14/2019   Procedure: RIGHT BREAST LUMPECTOMY WITH RADIOACTIVE SEED AND SENTINEL LYMPH NODE BIOPSY;  Surgeon: [name], MD;  Location: [place];  Service: General;  Laterality: Right;  . CHOLECYSTECTOMY    . RE-EXCISION OF BREAST LUMPECTOMY Right 11/11/2019   Procedure: RIGHT RE-EXCISION OF BREAST LUMPECTOMY;  Surgeon: [name], MD;  Location: [place];  Service: General;  Laterality: Right;  RNFA  . SKIN SURGERY  12/2015   Fatty tumor removed  . TONSILLECTOMY AND ADENOIDECTOMY  1956    There  were no vitals filed for this visit.   Subjective Assessment - 02/25/20 1311    Subjective Shoulder is doing great.  Occasionally I hear a little pop, or have a little twinge. I can reach with right arm and use it more. I am more careful with extrmes of reaching. No real limitation with home activities, dressing or bathing and no pain in atleast a week.   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 She had a right lumpectomy and sentinel node biopsy (2 negative nodes) on 10/14/2019. It is ER/PR positive and HER2 negative with a Ki67 of 5%. She cares for her husband who is in a motorized wheelchair and has bilateral BKA.    Currently in Pain? No/denies    Pain Score 0-No pain              [facility] PT Assessment - 02/25/20 0001      AROM   Right Shoulder Extension 60 Degrees    Right Shoulder Flexion 154 Degrees    Right Shoulder ABduction 158 Degrees    Right Shoulder External Rotation 88 Degrees      Strength   Right Shoulder Flexion 4+/5    Right Shoulder Extension 5/5    Right Shoulder Internal Rotation 5/5    Right  Shoulder External Rotation 4+/5             LYMPHEDEMA/ONCOLOGY QUESTIONNAIRE - 02/25/20 0001      Right Upper Extremity Lymphedema   10 cm Proximal to Olecranon Process 35.7 cm    Olecranon Process 28.7 cm    10 cm Proximal to Ulnar Styloid Process 24.2 cm    Just Proximal to Ulnar Styloid Process 16.1 cm    Across Hand at [employer] 19.5 cm    At [place] of 2nd Digit 6.1 cm              Quick Dash - 02/25/20 0001    Open a tight or new jar No difficulty    Do heavy household chores (wash walls, wash floors) Mild difficulty    Carry a shopping bag or briefcase No difficulty    Wash your back No difficulty    Use a knife to cut food No difficulty    Recreational activities in which you take some force or impact through your arm, shoulder, or hand (golf, hammering, tennis) Mild  difficulty    During the past week, to what extent has your arm, shoulder or hand problem interfered with your normal social activities with family, friends, neighbors, or groups? Not at all    During the past week, to what extent has your arm, shoulder or hand problem limited your work or other regular daily activities Not at all    Arm, shoulder, or hand pain. None    Tingling (pins and needles) in your arm, shoulder, or hand Mild    Difficulty Sleeping No difficulty    DASH Score 6.82 %                  [facility] Adult PT Treatment/Exercise - 02/25/20 0001      Shoulder Exercises: Standing   External Rotation Strengthening;Both;15 reps    Theraband Level (Shoulder External Rotation) Level 2 (Red)    Extension Strengthening;Both;15 reps    Retraction Strengthening;Both;15 reps    Theraband Level (Shoulder Retraction) Level 2 (Red)    Shoulder Elevation Strengthening;Both;15 reps   and flexion 1# x 15   Other Standing Exercises elbow flex 3# 2 x 10      Shoulder Exercises: Pulleys   Flexion 2 minutes    ABduction 2 minutes                  PT Education - 02/25/20 1347    Education Details Discussed progression of exercises and when/how to increase reps/resistance, Discussed checking arm for tolerance to exs and advised her to call should she have ? or concerns    Person(s) Educated Patient    Methods Explanation    Comprehension Verbalized understanding               PT Long Term Goals - 02/25/20 1323      PT LONG TERM GOAL #1   Title pt will be independent in HEP for right shoulder ROM/strength    Time 4    Period Weeks    Status Achieved      PT LONG TERM GOAL #2   Title Pt will have decreased right shoulder pain by 50% or greater    Time 6    Period Weeks    Status Achieved      PT LONG TERM GOAL #3   Title Pt to demo improved strength of R shoulder, to at least 4+/5 to improve ability for reaching, lifting,  and IADLS.     Time 6    Period Weeks     Status Achieved      PT LONG TERM GOAL #4   Title Pt will have AROM right shoulder WNL when compared with left    Baseline still limited slightly with abd    Period Weeks    Status Partially Met      PT LONG TERM GOAL #5   Title Quick dash will be no greater than 10% to demonstrate improved right UE function    Baseline 27.27 at eval, today 6%    Time 6    Period Weeks    Status Achieved      PT LONG TERM GOAL #6   Title Pt will  be fit for prophylactic sleeve prn    Time 4    Period Weeks    Status Deferred                  Patient will benefit from skilled therapeutic intervention in order to improve the following deficits and impairments:  Decreased knowledge of precautions,Pain,Postural dysfunction,Decreased range of motion,Impaired UE functional use,Decreased activity tolerance,Decreased strength  Visit Diagnosis: Chronic right shoulder pain  Malignant neoplasm of upper-outer quadrant of right breast in female, estrogen receptor positive (HCC)  Abnormal posture  Aftercare following surgery for neoplasm     Problem List Patient Active Problem List   Diagnosis Date Noted  . Genetic testing 10/06/2019  . Family history of pancreatic cancer   . Family history of lung cancer   . Malignant neoplasm of upper-outer quadrant of right breast in female, estrogen receptor positive ([place]) 09/18/2019  . Incomplete uterovaginal prolapse 04/03/2019  . 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 Plantar fasciitis 05/06/2017  . Controlled type 2 diabetes mellitus without complication, without long-term current use of insulin ([place]) 11/01/2016  . Hypertension associated with diabetes ([place]) 11/01/2016  . Severe obesity (BMI 35.0-35.9 with comorbidity) ([place]) 11/01/2016  . Combined hyperlipidemia associated with type 2 diabetes mellitus ([place]) 01/15/2012  . Allergic rhinitis 10/05/2011  . Ulcerative colitis without complications ([place]) 69/45/0388  . Diverticulosis of colon  10/17/2010  . Internal hemorrhoids 10/17/2010  . Acquired hypothyroidism 10/04/2010  . Osteoarthrosis of knee 10/04/2010  . OSA on CPAP 06/28/2010  PHYSICAL THERAPY DISCHARGE SUMMARY  Visits from Start of Care: 13  Current functional level related to goals / functional outcomes: Achieved all goals except mild limitation in abd AROM  Remaining deficits: Occasional right shoulder non- painful pop, or slight twinge   Education / Equipment:theraband  Plan: Patient agrees to discharge.  Patient goals were met. Patient is being discharged due to meeting the stated rehab goals.  ?????       Claris Pong 02/25/2020, 2:00 PM  [place], [place], [address] Phone: [phone]   Fax:  [phone]  Name: Michelle Sherman MRN: [id number] Date of Birth: 10/06/1950

## 2020-03-18 ENCOUNTER — Telehealth: Payer: Self-pay | Admitting: Nurse Practitioner

## 2020-03-18 NOTE — Telephone Encounter (Signed)
Rescheduled upcoming appointment per patient's request. Patient is aware of changes.

## 2020-04-05 ENCOUNTER — Ambulatory Visit (INDEPENDENT_AMBULATORY_CARE_PROVIDER_SITE_OTHER): Payer: Medicare Other

## 2020-04-05 ENCOUNTER — Other Ambulatory Visit: Payer: Self-pay

## 2020-04-05 VITALS — BP 118/78 | HR 81 | Temp 97.9°F | Wt 200.2 lb

## 2020-04-05 DIAGNOSIS — Z0000 Encounter for general adult medical examination without abnormal findings: Secondary | ICD-10-CM | POA: Diagnosis not present

## 2020-04-05 NOTE — Progress Notes (Signed)
Subjective:   MATISON NUCCIO is a 70 y.o. female who presents for Medicare Annual (Subsequent) preventive examination.  Review of Systems     Cardiac Risk Factors include: advanced age (>44mn, >>58women);diabetes mellitus;obesity (BMI >30kg/m2);hypertension     Objective:    Today's Vitals   04/05/20 1304 04/05/20 1310  BP: 118/78   Pulse: 81   Temp: 97.9 F (36.6 C)   SpO2: 92%   Weight: 200 lb 3.2 oz (90.8 kg)   PainSc:  5    Body mass index is 39.1 kg/m.  Advanced Directives 04/05/2020 01/29/2020 12/22/2019 12/11/2019 11/11/2019 11/04/2019 10/14/2019  Does Patient Have a Medical Advance Directive? No No No No No Yes No  Type of Advance Directive - - - - - HPress photographerLiving will -  Does patient want to make changes to medical advance directive? - - - - - No - Patient declined -  Would patient like information on creating a medical advance directive? Yes (MAU/Ambulatory/Procedural Areas - Information given) - No - Patient declined No - Patient declined No - Patient declined - No - Patient declined    Current Medications (verified) Outpatient Encounter Medications as of 04/05/2020  Medication Sig   anastrozole (ARIMIDEX) 1 MG tablet Take 1 tablet (1 mg total) by mouth daily.   aspirin EC 81 MG tablet Take 1 tablet by mouth daily.   atorvastatin (LIPITOR) 10 MG tablet Take 1 tablet (10 mg total) by mouth every evening.   Biotin 2500 MCG CAPS Take 1 capsule by mouth daily.   Cholecalciferol (VITAMIN D3) 2000 units capsule Take 1 capsule by mouth daily.   dapagliflozin propanediol (FARXIGA) 10 MG TABS tablet Take 1 tablet (10 mg total) by mouth daily.   famotidine (PEPCID) 40 MG tablet Take 40 mg by mouth 2 (two) times daily.   levothyroxine (SYNTHROID) 112 MCG tablet Take 1 tablet (112 mcg total) by mouth daily.   melatonin 5 MG TABS Take 5 mg by mouth at bedtime as needed.   mesalamine (LIALDA) 1.2 g EC tablet Take by mouth. Take 2 tablet in am  and 2 tablet in pm   metFORMIN (GLUCOPHAGE) 1000 MG tablet Take 1 tablet (1,000 mg total) by mouth 2 (two) times daily with a meal.   Misc Natural Products (NEURIVA PO) Take by mouth.   Misc Natural Products (TURMERIC CURCUMIN) CAPS Take 1 capsule by mouth daily.   Multiple Vitamin (MULTIVITAMIN) tablet Take 1 tablet by mouth daily.   ramipril (ALTACE) 10 MG capsule Take 1 capsule (10 mg total) by mouth daily.   [DISCONTINUED] glucosamine-chondroitin 500-400 MG tablet Take 1 tablet by mouth daily. (Patient not taking: Reported on 04/05/2020)   No facility-administered encounter medications on file as of 04/05/2020.    Allergies (verified) Nickel, Sulfa antibiotics, and Bydureon [exenatide]   History: Past Medical History:  Diagnosis Date   Allergy    Arthritis    [place]   right breast IDC   Cataract    Complication of anesthesia    unable to void after surgery and had to stay overnight    [place]    Family history of adverse reaction to anesthesia    sister, hard to wake up    Family history of lung cancer    Family history of pancreatic cancer    GERD (gastroesophageal reflux disease)    Hypertension    Sleep apnea    wears CPAP nightly   Thyroid  disease    Ulcerative colitis ([place])    Past Surgical History:  Procedure Laterality Date   BREAST LUMPECTOMY WITH RADIOACTIVE SEED AND SENTINEL LYMPH NODE BIOPSY Right 10/14/2019   Procedure: RIGHT BREAST LUMPECTOMY WITH RADIOACTIVE SEED AND SENTINEL LYMPH NODE BIOPSY;  Surgeon: [name], MD;  Location: [place];  Service: General;  Laterality: Right;   CHOLECYSTECTOMY     RE-EXCISION OF BREAST LUMPECTOMY Right 11/11/2019   Procedure: RIGHT RE-EXCISION OF BREAST LUMPECTOMY;  Surgeon: [name], MD;  Location: [place];  Service: General;  Laterality: Right;  RNFA   SKIN SURGERY  12/2015   Fatty tumor removed    TONSILLECTOMY AND ADENOIDECTOMY  1956   Family History  Problem Relation Age of Onset   Ulcerative colitis Mother    Diabetes Mother    Heart disease Father    Lung cancer Father 2       Lung   Varicose Veins Sister    Other Sister        "stiff heart"   Diabetes Brother    Pancreatic cancer Brother 18       Pancreatic   Diabetes Maternal Aunt    Diabetes Maternal Uncle    Heart attack Paternal Grandfather    Crohn's disease Brother    Ulcerative colitis Brother    Diabetes Brother    Cancer Brother        unsure of type   Lung cancer Paternal Aunt        smoker   Cancer Paternal Aunt        unknown type   Social History   Socioeconomic History   Marital status: Married    Spouse name: Not on file   Number of children: 1   Years of education: Not on file   Highest education level: Not on file  Occupational History   Occupation: Retired     Comment: Oceanographer   Tobacco Use   Smoking status: Never Smoker   Smokeless tobacco: Never Used  Scientific laboratory technician Use: Never used  Substance and Sexual Activity   Alcohol use: Yes    Comment: occa   Drug use: No   Sexual activity: Yes    Birth control/protection: Post-menopausal  Other Topics Concern   Not on file  Social History Narrative   Married, cares for disabled husband,  no tob, Etoh or drug use; no exercise   Social Determinants of Radio broadcast assistant Strain: Low Risk    Difficulty of Paying Living Expenses: Not hard at all  Food Insecurity: No Food Insecurity   Worried About Charity fundraiser in the Last Year: Never true   Arboriculturist in the Last Year: Never true  Transportation Needs: No Transportation Needs   Lack of Transportation (Medical): No   Lack of Transportation (Non-Medical): No  Physical Activity: Inactive   Days of Exercise per Week: 0 days   Minutes of Exercise per Session: 0 min  Stress: No Stress Concern Present   Feeling of  Stress : Not at all  Social Connections: Socially Integrated   Frequency of Communication with Friends and Family: More than three times a week   Frequency of Social Gatherings with Friends and Family: Once a week   Attends Religious Services: More than 4 times per year   Active Member of [employer] or Organizations: Yes   Attends Archivist Meetings: 1 to 4 times per year  Marital Status: Married    Tobacco Counseling Counseling given: Not Answered   Clinical Intake:  Pre-visit preparation completed: Yes  Pain : 0-10 Pain Score: 5  Pain Location: Knee Pain Orientation: Right Pain Descriptors / Indicators: Sharp Pain Onset: More than a month ago Pain Frequency: Intermittent     BMI - recorded: 39.1 Nutritional Status: BMI > 30  Obese Nutritional Risks: None Diabetes: Yes CBG done?: Yes (159) CBG resulted in Enter/ Edit results?: No Did pt. bring in CBG monitor from home?: No  How often do you need to have someone help you when you read instructions, pamphlets, or other written materials from your doctor or pharmacy?: 1 - Never  Diabetic?Nutrition Risk Assessment:  Has the patient had any N/V/D within the last 2 months?  No  Does the patient have any non-healing wounds?  No  Has the patient had any unintentional weight loss or weight gain?  No   Diabetes:  Is the patient diabetic?  Yes  If diabetic, was a CBG obtained today?  Yes  Did the patient bring in their glucometer from home?  No  How often do you monitor your CBG's? Daily .   Financial Strains and Diabetes Management:  Are you having any financial strains with the device, your supplies or your medication? No .  Does the patient want to be seen by Chronic Care Management for management of their diabetes?  No  Would the patient like to be referred to a Nutritionist or for Diabetic Management?  No   Diabetic Exams:  Diabetic Eye Exam: Completed 12/08/19 Diabetic Foot Exam: Completed  07/10/19  Interpreter Needed?: No  Information entered by :: Charlott Rakes, LPN   Activities of Daily Living In your present state of health, do you have any difficulty performing the following activities: 04/05/2020 01/07/2020  Hearing? Y N  Comment mild loss -  Vision? N N  Difficulty concentrating or making decisions? Y N  Comment memory at times -  Walking or climbing stairs? Y N  Dressing or bathing? N N  Doing errands, shopping? N N  Preparing Food and eating ? N -  Using the Toilet? N -  In the past six months, have you accidently leaked urine? N -  Do you have problems with loss of bowel control? Y -  Comment accidents at times -  Managing your Medications? N -  Managing your Finances? N -  Housekeeping or managing your Housekeeping? N -  Some recent data might be hidden    Patient Care Team: Leamon Arnt, MD as PCP - General (Family Medicine) Deneise Lever, MD as Consulting Physician (Pulmonary Disease) Juanita Craver, MD as Consulting Physician (Gastroenterology) Lyndee Hensen, PT as Physical Therapist (Physical Therapy) Trula Slade, DPM as Consulting Physician (Podiatry) Katy Apo, MD as Consulting Physician (Ophthalmology) Rockwell Germany, RN as Oncology Nurse Navigator Mauro Kaufmann, RN as Oncology Nurse Navigator [name], MD as Consulting Physician (General Surgery) Truitt Merle, MD as Consulting Physician (Hematology) Kyung Rudd, MD as Consulting Physician (Radiation Oncology)  Indicate any recent [place] you may have received from other than [facility] providers in the past year (date may be approximate).     Assessment:   This is a routine wellness examination for Kassia.  Hearing/Vision screen  Hearing Screening   125Hz  250Hz  500Hz  1000Hz  2000Hz  3000Hz  4000Hz  6000Hz  8000Hz   Right ear:           Left ear:  Comments: Pt mild loss   Vision Screening Comments: Pt follows up with Dr Katy Apo for eye exams    Dietary issues and exercise activities discussed: Current Exercise Habits: The patient does not participate in regular exercise at present  Goals     Patient Stated     Lose weight      Weight (lb) < 180 lb (81.6 kg)     Lose weight by continuing weight watchers and increasing exercise.      Weight (lb) < 195 lb (88.5 kg)     Lose weight by watching carb intake and increase activity.       Depression Screen PHQ 2/9 Scores 04/05/2020 01/07/2020 04/03/2019 04/03/2019 03/27/2018 06/19/2017 06/19/2017  PHQ - 2 Score 0 0 0 0 0 0 0  PHQ- 9 Score - - - - - - 0    Fall Risk Fall Risk  04/05/2020 01/07/2020 04/03/2019 04/03/2019 03/27/2018  Falls in the past year? 0 0 0 0 0  Number falls in past yr: 0 0 0 - -  Injury with Fall? 0 0 0 - -  Risk for fall due to : Impaired vision;Impaired balance/gait - No Fall Risks - -  Follow up Falls prevention discussed - Falls evaluation completed;Education provided;Falls prevention discussed Falls evaluation completed -    FALL RISK PREVENTION PERTAINING TO THE HOME:  Any stairs in or around the home? Yes  If so, are there any without handrails? No  Home free of loose throw rugs in walkways, pet beds, electrical cords, etc? Yes  Adequate lighting in your home to reduce risk of falls? Yes   ASSISTIVE DEVICES UTILIZED TO PREVENT FALLS:  Life alert? No  Use of a cane, walker or w/c? No  Grab bars in the bathroom? No  Shower chair or bench in shower? Yes  Elevated toilet seat or a handicapped toilet? No   TIMED UP AND GO:  Was the test performed? Yes .  Length of time to ambulate 10 feet: 10 sec.   Gait steady and fast without use of assistive device  Cognitive Function: MMSE - Mini Mental State Exam 03/27/2018  Orientation to time 5  Orientation to Place 5  Registration 3  Attention/ Calculation 5  Recall 3  Language- name 2 objects 2  Language- repeat 1  Language- follow 3 step command 3  Language- read & follow direction 1  Write a  sentence 1  Copy design 1  Total score 30     6CIT Screen 04/05/2020 04/03/2019  What Year? 0 points 0 points  What month? 0 points 0 points  What time? - 0 points  Count back from 20 0 points 0 points  Months in reverse 0 points 0 points  Repeat phrase 0 points 0 points  Total Score - 0    Immunizations Immunization History  Administered Date(s) Administered   Fluad Quad(high Dose 65+) 12/15/2019   Influenza, High Dose Seasonal PF 12/06/2015, 11/01/2016, 09/21/2017, 10/26/2018   Moderna Sars-Covid-2 Vaccination 04/24/2019, 05/21/2019   Pneumococcal Conjugate-13 10/22/2014   Pneumococcal Polysaccharide-23 10/05/2011, 12/21/2016   Tdap 10/04/2010   Zoster 10/03/2009   Zoster Recombinat (Shingrix) 01/02/2019, 03/25/2019    TDAP status: Up to date  Flu Vaccine status: Up to date  Pneumococcal vaccine status: Up to date  Covid-19 vaccine status: Completed vaccines  Qualifies for Shingles Vaccine? Yes   Zostavax completed Yes   Shingrix Completed?: Yes  Screening Tests Health Maintenance  Topic Date Due   COVID-19 Vaccine (  3 - Moderna risk 4-dose series) 06/18/2019   HEMOGLOBIN A1C  07/07/2020   FOOT EXAM  07/09/2020   DEXA SCAN  08/07/2020   MAMMOGRAM  08/21/2020   TETANUS/TDAP  10/03/2020   OPHTHALMOLOGY EXAM  12/07/2020   COLONOSCOPY (Pts 45-50yr Insurance coverage will need to be confirmed)  04/01/2026   INFLUENZA VACCINE  Completed   Hepatitis C Screening  Completed   PNA vac Low Risk Adult  Completed   HPV VACCINES  Aged Out    Health Maintenance  Health Maintenance Due  Topic Date Due   COVID-19 Vaccine (3 - Moderna risk 4-dose series) 06/18/2019    Colorectal cancer screening: Type of screening: Colonoscopy. Completed 03/31/16. Repeat every 10 years  Mammogram status: Completed 09/05/19. Repeat every year  Bone Density status: Completed 08/07/17. Results reflect: Bone density results: NORMAL. Repeat every 3 years.   Additional  Screening:  Hepatitis C Screening:  Completed 08/26/15  Vision Screening: Recommended annual ophthalmology exams for early detection of glaucoma and other disorders of the eye. Is the patient up to date with their annual eye exam?  Yes  Who is the provider or what is the name of the office in which the patient attends annual eye exams? Dr GPhillip Heallyles  If pt is not established with a provider, would they like to be referred to a provider to establish care? No .   Dental Screening: Recommended annual dental exams for proper oral hygiene  Community Resource Referral / Chronic Care Management: CRR required this visit?  No   CCM required this visit?  No      Plan:     I have personally reviewed and noted the following in the patients chart:    Medical and social history  Use of alcohol, tobacco or illicit drugs   Current medications and supplements  Functional ability and status  Nutritional status  Physical activity  Advanced directives  List of other physicians  Hospitalizations, surgeries, and ER visits in previous 12 months  Vitals  Screenings to include cognitive, depression, and falls  Referrals and appointments  In addition, I have reviewed and discussed with patient certain preventive protocols, quality metrics, and best practice recommendations. A written personalized care plan for preventive services as well as general preventive health recommendations were provided to patient.     TWillette Brace LPN   [id number]  Nurse Notes: None

## 2020-04-05 NOTE — Patient Instructions (Addendum)
Michelle Sherman , Thank you for taking time to come for your Medicare Wellness Visit. I appreciate your ongoing commitment to your health goals. Please review the following plan we discussed and let me know if I can assist you in the future.   Screening recommendations/referrals: Colonoscopy: Done 03/31/16 Mammogram: Done 09/05/19 Bone Density: Done 08/07/17 Recommended yearly ophthalmology/optometry visit for glaucoma screening and checkup Recommended yearly dental visit for hygiene and checkup  Vaccinations: Influenza vaccine: Up to date Pneumococcal vaccine: Up to date Tdap vaccine: Up to date Shingles vaccine: Completed 01/02/19 & 03/25/19   Covid-19:Completed 4/1& 05/21/19  Advanced directives: Please bring a copy of your health care power of attorney and living will to the office at your convenience.  Conditions/risks identified: Lose weight   Next appointment: Follow up in one year for your annual wellness visit    Preventive Care 65 Years and Older, Female Preventive care refers to lifestyle choices and visits with your health care provider that can promote health and wellness. What does preventive care include?  A yearly physical exam. This is also called an annual well check.  Dental exams once or twice a year.  Routine eye exams. Ask your health care provider how often you should have your eyes checked.  Personal lifestyle choices, including:  Daily care of your teeth and gums.  Regular physical activity.  Eating a healthy diet.  Avoiding tobacco and drug use.  Limiting alcohol use.  Practicing safe sex.  Taking low-dose aspirin every day.  Taking vitamin and mineral supplements as recommended by your health care provider. What happens during an annual well check? The services and screenings done by your health care provider during your annual well check will depend on your age, overall health, lifestyle risk factors, and family history of disease. Counseling   Your health care provider may ask you questions about your:  Alcohol use.  Tobacco use.  Drug use.  Emotional well-being.  Home and relationship well-being.  Sexual activity.  Eating habits.  History of falls.  Memory and ability to understand (cognition).  Work and work Statistician.  Reproductive health. Screening  You may have the following tests or measurements:  Height, weight, and BMI.  Blood pressure.  Lipid and cholesterol levels. These may be checked every 5 years, or more frequently if you are over 54 years old.  Skin check.  Lung cancer screening. You may have this screening every year starting at age 61 if you have a 30-pack-year history of smoking and currently smoke or have quit within the past 15 years.  Fecal occult blood test (FOBT) of the stool. You may have this test every year starting at age 8.  Flexible sigmoidoscopy or colonoscopy. You may have a sigmoidoscopy every 5 years or a colonoscopy every 10 years starting at age 43.  Hepatitis C blood test.  Hepatitis B blood test.  Sexually transmitted disease (STD) testing.  Diabetes screening. This is done by checking your blood sugar (glucose) after you have not eaten for a while (fasting). You may have this done every 1-3 years.  Bone density scan. This is done to screen for osteoporosis. You may have this done starting at age 87.  Mammogram. This may be done every 1-2 years. Talk to your health care provider about how often you should have regular mammograms. Talk with your health care provider about your test results, treatment options, and if necessary, the need for more tests. Vaccines  Your health care provider may recommend certain  vaccines, such as:  Influenza vaccine. This is recommended every year.  Tetanus, diphtheria, and acellular pertussis (Tdap, Td) vaccine. You may need a Td booster every 10 years.  Zoster vaccine. You may need this after age 31.  Pneumococcal 13-valent  conjugate (PCV13) vaccine. One dose is recommended after age 72.  Pneumococcal polysaccharide (PPSV23) vaccine. One dose is recommended after age 110. Talk to your health care provider about which screenings and vaccines you need and how often you need them. This information is not intended to replace advice given to you by your health care provider. Make sure you discuss any questions you have with your health care provider. Document Released: 02/05/2015 Document Revised: 09/29/2015 Document Reviewed: 11/10/2014 Elsevier Interactive Patient Education  2017 [place] Prevention in the Home Falls can cause injuries. They can happen to people of all ages. There are many things you can do to make your home safe and to help prevent falls. What can I do on the outside of my home?  Regularly fix the edges of walkways and driveways and fix any cracks.  Remove anything that might make you trip as you walk through a door, such as a raised step or threshold.  Trim any bushes or trees on the path to your home.  Use bright outdoor lighting.  Clear any walking paths of anything that might make someone trip, such as rocks or tools.  Regularly check to see if handrails are loose or broken. Make sure that both sides of any steps have handrails.  Any raised decks and porches should have guardrails on the edges.  Have any leaves, snow, or ice cleared regularly.  Use sand or salt on walking paths during winter.  Clean up any spills in your garage right away. This includes oil or grease spills. What can I do in the bathroom?  Use night lights.  Install grab bars by the toilet and in the tub and shower. Do not use towel bars as grab bars.  Use non-skid mats or decals in the tub or shower.  If you need to sit down in the shower, use a plastic, non-slip stool.  Keep the floor dry. Clean up any water that spills on the floor as soon as it happens.  Remove soap buildup in the tub or  shower regularly.  Attach bath mats securely with double-sided non-slip rug tape.  Do not have throw rugs and other things on the floor that can make you trip. What can I do in the bedroom?  Use night lights.  Make sure that you have a light by your bed that is easy to reach.  Do not use any sheets or blankets that are too big for your bed. They should not hang down onto the floor.  Have a firm chair that has side arms. You can use this for support while you get dressed.  Do not have throw rugs and other things on the floor that can make you trip. What can I do in the kitchen?  Clean up any spills right away.  Avoid walking on wet floors.  Keep items that you use a lot in easy-to-reach places.  If you need to reach something above you, use a strong step stool that has a grab bar.  Keep electrical cords out of the way.  Do not use floor polish or wax that makes floors slippery. If you must use wax, use non-skid floor wax.  Do not have throw rugs and other things  on the floor that can make you trip. What can I do with my stairs?  Do not leave any items on the stairs.  Make sure that there are handrails on both sides of the stairs and use them. Fix handrails that are broken or loose. Make sure that handrails are as long as the stairways.  Check any carpeting to make sure that it is firmly attached to the stairs. Fix any carpet that is loose or worn.  Avoid having throw rugs at the top or bottom of the stairs. If you do have throw rugs, attach them to the floor with carpet tape.  Make sure that you have a light switch at the top of the stairs and the bottom of the stairs. If you do not have them, ask someone to add them for you. What else can I do to help prevent falls?  Wear shoes that:  Do not have high heels.  Have rubber bottoms.  Are comfortable and fit you well.  Are closed at the toe. Do not wear sandals.  If you use a stepladder:  Make sure that it is fully  opened. Do not climb a closed stepladder.  Make sure that both sides of the stepladder are locked into place.  Ask someone to hold it for you, if possible.  Clearly mark and make sure that you can see:  Any grab bars or handrails.  First and last steps.  Where the edge of each step is.  Use tools that help you move around (mobility aids) if they are needed. These include:  Canes.  Walkers.  Scooters.  Crutches.  Turn on the lights when you go into a dark area. Replace any light bulbs as soon as they burn out.  Set up your furniture so you have a clear path. Avoid moving your furniture around.  If any of your floors are uneven, fix them.  If there are any pets around you, be aware of where they are.  Review your medicines with your doctor. Some medicines can make you feel dizzy. This can increase your chance of falling. Ask your doctor what other things that you can do to help prevent falls. This information is not intended to replace advice given to you by your health care provider. Make sure you discuss any questions you have with your health care provider. Document Released: 11/05/2008 Document Revised: 06/17/2015 Document Reviewed: 02/13/2014 Elsevier Interactive Patient Education  2017 [employer].

## 2020-04-29 ENCOUNTER — Encounter: Payer: Medicare Other | Admitting: Nurse Practitioner

## 2020-05-03 ENCOUNTER — Other Ambulatory Visit: Payer: Self-pay

## 2020-05-03 ENCOUNTER — Ambulatory Visit: Payer: Medicare Other | Attending: General Surgery

## 2020-05-03 DIAGNOSIS — Z483 Aftercare following surgery for neoplasm: Secondary | ICD-10-CM | POA: Insufficient documentation

## 2020-05-03 DIAGNOSIS — R293 Abnormal posture: Secondary | ICD-10-CM | POA: Insufficient documentation

## 2020-05-03 DIAGNOSIS — R600 Localized edema: Secondary | ICD-10-CM | POA: Insufficient documentation

## 2020-05-03 NOTE — Therapy (Signed)
[place], [place], [address] Phone: [phone]   Fax:  [phone]  Physical Therapy Treatment  Patient Details  Name: Michelle Sherman MRN: [id number] Date of Birth: [birth year] Referring Provider (PT): Dr. [name]   Encounter Date: 05/03/2020   PT End of Session - 05/03/20 1606    Visit Number 13   # unchanged due to screen only   Number of Visits 20    Date for PT Re-Evaluation 03/01/20    PT Start Time 1552    PT Stop Time 1605    PT Time Calculation (min) 13 min    Behavior During Therapy  [facility] for tasks assessed/performed           Past Medical History:  Diagnosis Date  . Allergy   . Arthritis   . Breast cancer ([place]) 08/2019   right breast IDC  . Cataract   . Complication of anesthesia    unable to void after surgery and had to stay overnight   . Diabetes mellitus without complication ([place])   . Family history of adverse reaction to anesthesia    sister, hard to wake up   . Family history of lung cancer   . Family history of pancreatic cancer   . GERD (gastroesophageal reflux disease)   . Hypertension   . Sleep apnea    wears CPAP nightly  . Thyroid disease   . Ulcerative colitis [facility])     Past Surgical History:  Procedure Laterality Date  . BREAST LUMPECTOMY WITH RADIOACTIVE SEED AND SENTINEL LYMPH NODE BIOPSY Right 10/14/2019   Procedure: RIGHT BREAST LUMPECTOMY WITH RADIOACTIVE SEED AND SENTINEL LYMPH NODE BIOPSY;  Surgeon: [name], MD;  Location: [facility];  Service: General;  Laterality: Right;  . CHOLECYSTECTOMY    . RE-EXCISION OF BREAST LUMPECTOMY Right 11/11/2019   Procedure: RIGHT RE-EXCISION OF BREAST LUMPECTOMY;  Surgeon: [name], MD;  Location: [facility];  Service: General;  Laterality: Right;  RNFA  . SKIN SURGERY  12/2015   Fatty tumor removed  . TONSILLECTOMY AND ADENOIDECTOMY  1956    There were no vitals filed  for this visit.   Subjective Assessment - 05/03/20 1554    Subjective Pt returns for her 3 month L-Dex screen. She reports her Rt breast has been feeling heavy and is still larger than her Lt breast since completing radiation in December.    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 She had a right lumpectomy and sentinel node biopsy (2 negative nodes) on 10/14/2019. It is ER/PR positive and HER2 negative with a Ki67 of 5%. She cares for her husband who is in a motorized wheelchair and has bilateral BKA.                  L-DEX FLOWSHEETS - 05/03/20 1500      L-DEX LYMPHEDEMA SCREENING   Measurement Type Unilateral    L-DEX MEASUREMENT EXTREMITY Upper Extremity    POSITION  Standing    DOMINANT SIDE Right    At Risk Side Right    BASELINE SCORE (UNILATERAL) -2.2    L-DEX SCORE (UNILATERAL) 2.2    VALUE CHANGE (UNILAT) 4.4                                  PT Long Term Goals - 02/25/20 1323  PT LONG TERM GOAL #1   Title pt will be independent in HEP for right shoulder ROM/strenght    Time 4    Period Weeks    Status Achieved      PT LONG TERM GOAL #2   Title Pt will have decreased right shoulder pain by 50% or greater    Time 6    Period Weeks    Status Achieved      PT LONG TERM GOAL #3   Title Pt to demo improved strength of R shoulder, to at least 4+/5 to improve ability for reaching, lifting, and IADLS.     Time 6    Period Weeks    Status Achieved      PT LONG TERM GOAL #4   Title Pt will have AROM right shoulder WNL when compared with left    Baseline still limited slightly with abd    Period Weeks    Status Partially Met      PT LONG TERM GOAL #5   Title Quick dash will be no greater than 10% to demonstrate improved right UE function    Baseline 27.27 at eval, today 6%    Time 6    Period Weeks    Status Achieved      PT LONG TERM GOAL #6   Title Pt  will  be fit for prophylactic sleeve prn    Time 4    Period Weeks    Status Deferred                 Plan - 05/03/20 1607    Clinical Impression Statement Pt returns for her 3 month L-Dex screen. Her change from baseline of 4.4 is WNLs, however she is reporting heaviness and fullness in her breast since completing radiation. 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 Cont every 3 month L-Dex screens for up to 2 years from her SLNB.    Consulted and Agree with Plan of Care Patient           Patient will benefit from skilled therapeutic intervention in order to improve the following deficits and impairments:     Visit Diagnosis: Aftercare following surgery for neoplasm     Problem List Patient Active Problem List   Diagnosis Date Noted  . Genetic testing 10/06/2019  . Family history of pancreatic cancer   . Family history of lung cancer   . Malignant neoplasm of upper-outer quadrant of right breast in female, estrogen receptor positive ([place]) 09/18/2019  . Incomplete uterovaginal prolapse 04/03/2019  . 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 Plantar fasciitis 05/06/2017  . Controlled type 2 diabetes mellitus without complication, without long-term current use of insulin ([place]) 11/01/2016  . Hypertension associated with diabetes ([facility]) 11/01/2016  . Severe obesity (BMI 35.0-35.9 with comorbidity) ([place]) 11/01/2016  . Combined hyperlipidemia associated with type 2 diabetes mellitus ([place]) 01/15/2012  . Allergic rhinitis 10/05/2011  . Ulcerative colitis without complications ([place]) 10/15/3005  . Diverticulosis of colon 10/17/2010  . Internal hemorrhoids 10/17/2010  . Acquired hypothyroidism 10/04/2010  .  Osteoarthrosis of knee 10/04/2010  . OSA on CPAP 06/28/2010    [name], PTA 05/03/2020, 4:51 PM  [facility]  [place], [place], [address] Phone: [phone]   Fax:  [phone]  Name: MICIAH SHEALY MRN: [id number] Date of Birth: 1950/05/18

## 2020-05-04 ENCOUNTER — Inpatient Hospital Stay: Payer: Medicare Other | Admitting: Nurse Practitioner

## 2020-05-04 ENCOUNTER — Encounter: Payer: Self-pay | Admitting: Nurse Practitioner

## 2020-05-06 ENCOUNTER — Inpatient Hospital Stay: Payer: Medicare Other | Attending: Hematology | Admitting: Nurse Practitioner

## 2020-05-06 ENCOUNTER — Encounter: Payer: Self-pay | Admitting: Nurse Practitioner

## 2020-05-06 ENCOUNTER — Other Ambulatory Visit: Payer: Self-pay

## 2020-05-06 VITALS — BP 124/62 | HR 73 | Temp 97.2°F | Resp 20 | Ht 60.0 in | Wt 201.7 lb

## 2020-05-06 DIAGNOSIS — Z923 Personal history of irradiation: Secondary | ICD-10-CM | POA: Insufficient documentation

## 2020-05-06 DIAGNOSIS — Z800 Family history of malignant neoplasm of digestive organs: Secondary | ICD-10-CM | POA: Diagnosis not present

## 2020-05-06 DIAGNOSIS — Z801 Family history of malignant neoplasm of trachea, bronchus and lung: Secondary | ICD-10-CM | POA: Insufficient documentation

## 2020-05-06 DIAGNOSIS — C50411 Malignant neoplasm of upper-outer quadrant of right female breast: Secondary | ICD-10-CM | POA: Diagnosis not present

## 2020-05-06 DIAGNOSIS — E079 Disorder of thyroid, unspecified: Secondary | ICD-10-CM | POA: Diagnosis not present

## 2020-05-06 DIAGNOSIS — E119 Type 2 diabetes mellitus without complications: Secondary | ICD-10-CM | POA: Diagnosis not present

## 2020-05-06 DIAGNOSIS — Z79899 Other long term (current) drug therapy: Secondary | ICD-10-CM | POA: Insufficient documentation

## 2020-05-06 DIAGNOSIS — K219 Gastro-esophageal reflux disease without esophagitis: Secondary | ICD-10-CM | POA: Diagnosis not present

## 2020-05-06 DIAGNOSIS — K5190 Ulcerative colitis, unspecified, without complications: Secondary | ICD-10-CM | POA: Diagnosis not present

## 2020-05-06 DIAGNOSIS — R232 Flushing: Secondary | ICD-10-CM | POA: Diagnosis not present

## 2020-05-06 DIAGNOSIS — Z7984 Long term (current) use of oral hypoglycemic drugs: Secondary | ICD-10-CM | POA: Diagnosis not present

## 2020-05-06 DIAGNOSIS — Z7982 Long term (current) use of aspirin: Secondary | ICD-10-CM | POA: Insufficient documentation

## 2020-05-06 DIAGNOSIS — G4730 Sleep apnea, unspecified: Secondary | ICD-10-CM | POA: Diagnosis not present

## 2020-05-06 DIAGNOSIS — Z79811 Long term (current) use of aromatase inhibitors: Secondary | ICD-10-CM | POA: Insufficient documentation

## 2020-05-06 DIAGNOSIS — Z170 Estrogen receptor positive status [ER+]: Secondary | ICD-10-CM | POA: Diagnosis not present

## 2020-05-06 DIAGNOSIS — I10 Essential (primary) hypertension: Secondary | ICD-10-CM | POA: Insufficient documentation

## 2020-05-06 DIAGNOSIS — R059 Cough, unspecified: Secondary | ICD-10-CM | POA: Insufficient documentation

## 2020-05-06 NOTE — Progress Notes (Signed)
CLINIC:  Survivorship   Patient Care Team: Leamon Arnt, MD as PCP - General (Family Medicine) Deneise Lever, MD as Consulting Physician (Pulmonary Disease) Juanita Craver, MD as Consulting Physician (Gastroenterology) Lyndee Hensen, PT as Physical Therapist (Physical Therapy) Trula Slade, DPM as Consulting Physician (Podiatry) Katy Apo, MD as Consulting Physician (Ophthalmology) Rockwell Germany, RN as Oncology Nurse Navigator Mauro Kaufmann, RN as Oncology Nurse Navigator [name], MD as Consulting Physician (General Surgery) Truitt Merle, MD as Consulting Physician (Hematology) Kyung Rudd, MD as Consulting Physician (Radiation Oncology) Alla Feeling, NP as Nurse Practitioner (Nurse Practitioner)  REASON FOR VISIT:  Routine follow-up post-treatment for a recent history of breast cancer.  BRIEF ONCOLOGIC HISTORY:  Oncology History Overview Note  Cancer Staging Malignant neoplasm of upper-outer quadrant of right breast in female, estrogen receptor positive ([place]) Staging form: Breast, AJCC 8th Edition - Clinical stage from 09/12/2019: Stage IA (cT1b, cN0, cM0, G1, ER+, PR+, HER2-) - Signed by Truitt Merle, MD on 09/23/2019    Malignant neoplasm of upper-outer quadrant of right breast in female, estrogen receptor positive ([place])  09/05/2019 Mammogram   IMPRESSION: Indeterminate 9 x 6 x 6 mm mass involving the OUTER RIGHT breast at the 9 o'clock position approximately 5 cm from the nipple at POSTERIOR depth, located anterior to a normal appearing intramammary lymph Node.      09/12/2019 Cancer Staging   Staging form: Breast, AJCC 8th Edition - Clinical stage from 09/12/2019: Stage IA (cT1b, cN0, cM0, G1, ER+, PR+, HER2-) - Signed by Truitt Merle, MD on 09/23/2019   09/12/2019 Initial Biopsy   Diagnosis 1. Breast, right, needle core biopsy, 9 o'clock posterior - INVASIVE DUCTAL CARCINOMA WITH PAPILLARY FEATURES. 2. Breast, right, needle core biopsy, 9 o'clock  anterior - INVASIVE DUCTAL CARCINOMA WITH PAPILLARY FEATURES. Microscopic Comment 1. - 2. The specimens share similar morphologic features. E-cadherin is positive and CK5/6 is negative. P63, Calponin and SMM-1 demonstrate the absence of myoepithelium. Ancillary studies will be reported separately. Results reported to [facility] on 09/15/2019. Intradepartmental consultation (Dr. Tresa Moore).   09/12/2019 Receptors her2   1. PROGNOSTIC INDICATORS Results: IMMUNOHISTOCHEMICAL AND MORPHOMETRIC ANALYSIS PERFORMED MANUALLY The tumor cells are NEGATIVE for Her2 (1+). Estrogen Receptor: 99%, POSITIVE, STRONG STAINING INTENSITY Progesterone Receptor: 99%, POSITIVE, STRONG STAINING INTENSITY Proliferation Marker Ki67: 5%   09/18/2019 Initial Diagnosis   Malignant neoplasm of upper-outer quadrant of right breast in female, estrogen receptor positive ([place])   10/05/2019 Genetic Testing   Negative genetic testing:  No pathogenic variants detected on the [facility] Breast Cancer STAT Panel + Common Hereditary Cancers Panel. 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 The report date is 10/05/2019.  The Breast Cancer STAT Panel offered by [facility] includes sequencing and deletion/duplication analysis for the following 9 genes:  ATM, BRCA1, BRCA2, CDH1, CHEK2, PALB2, PTEN, STK11 and TP53. The Common Hereditary Cancers Panel offered by [facility] includes sequencing and/or deletion duplication testing of the following 48 genes: APC, ATM, AXIN2, BARD1, BMPR1A, BRCA1, BRCA2, BRIP1, CDH1, CDK4, CDKN2A (p14ARF), CDKN2A (p16INK4a), CHEK2, CTNNA1, DICER1, EPCAM (Deletion/duplication testing only), GREM1 (promoter region deletion/duplication testing only), KIT, MEN1, MLH1, MSH2, MSH3, MSH6, MUTYH, NBN, NF1, NTHL1, PALB2, PDGFRA, PMS2, POLD1, POLE, PTEN, RAD50, RAD51C, RAD51D, RNF43, SDHB, SDHC, SDHD, SMAD4, SMARCA4. STK11, TP53, TSC1, TSC2, and VHL.  The following genes  were evaluated for sequence changes only: SDHA and HOXB13 c.251G>A variant only.    10/14/2019 Pathology Results  RIGHT BREAST LUMPECTOMY WITH RADIOACTIVE SEED AND SENTINEL LYMPH NODE BIOPSY by Dr Barry Dienes    FINAL MICROSCOPIC DIAGNOSIS:   A. BREAST, RIGHT, LUMPECTOMY:  - Multifocal invasive ductal carcinoma with papillary features, grade 2,  spanning 0.9 cm and 0.5 cm.  - Intermediate grade ductal carcinoma in situ.  - Invasive carcinoma present at original inferior margin broadly, final  inferior margin (Part F) is negative.  - Margins are negative for in situ carcinoma.  - Biopsy site.  - See oncology table.   B. BREAST, RIGHT ADDITIONAL POSTERIOR MARGIN, EXCISION:  - Benign breast tissue.   C. BREAST, RIGHT ADDITIONAL LATERAL MARGIN, EXCISION:  - Benign breast tissue.   D. BREAST, RIGHT ADDITIONAL SUPERIOR MARGIN, EXCISION:  - Benign breast tissue.   E. BREAST, RIGHT ADDITIONAL MEDIAL MARGIN, EXCISION:  - Invasive ductal carcinoma with papillary features, grade 2, spanning  0.5 cm.  - Invasive carcinoma is present at the new medial margin broadly.   F. BREAST, RIGHT ADDITIONAL INFERIOR MARGIN, EXCISION:  - Benign breast tissue.   G. LYMPH NODE, RIGHT AXILLARY #1, SENTINEL, EXCISION:  - One of one lymph nodes negative for carcinoma (0/1).   H. LYMPH NODE, RIGHT AXILLARY #2, SENTINEL, EXCISION:  - One of one lymph nodes negative for carcinoma (0/1).   10/14/2019 Oncotype testing   Oncotype  Recurrence Score 0 with distant recurrence risk at 9 years of 3%.  There is less then 1% benefit of chemotherapy   10/14/2019 Cancer Staging   Staging form: Breast, AJCC 8th Edition - Pathologic stage from 10/14/2019: Stage IA (pT1b, pN0, cM0, G2, ER+, PR+, HER2-, Oncotype DX score: 0) - Signed by Truitt Merle, MD on 01/29/2020   11/11/2019 Pathology Results   RIGHT RE-EXCISION OF BREAST LUMPECTOMY by Dr Barry Dienes    FINAL MICROSCOPIC DIAGNOSIS:   A. BREAST, RIGHT, RE-EXCISION OF  MEDIAL MARGIN:  - Prior procedure site changes.  - No carcinoma identified.   COMMENT:   P63, Calponin and SMM-1 demonstrate the presence of myoepithelium in the  select focus.    12/22/2019 - 01/19/2020 Radiation Therapy   Adjuvant Radiation with Dr Lisbeth Renshaw    05/06/2020 Survivorship   SCP delivered by Cira Rue, NP      INTERVAL HISTORY:  Ms. Embry presents to the [facility] today for our initial meeting to review her survivorship care plan detailing her treatment course for breast cancer, as well as monitoring long-term side effects of that treatment, education regarding health maintenance, screening, and overall wellness and health promotion.     Ms. Fasching reports doing well.  She has some right breast tugging on extension and tenderness in the axilla incision.  She notes the breast feels heavy, denies new lump/mass, nipple discharge or inversion, or skin change.  She continues anastrozole which she tolerates well.  She does have hot flashes that are worse than with menopause, range from mild to severe but a little better now than when she initially began AI.  She is willing to tolerate this.  She has arthritic pain at baseline which worsened in the past 2 days, especially knee pain.  This is not limiting her function, it is managed with arthritis strength Tylenol.  Baseline cough she attributes to seasonal allergies is stable.  Mood is stable, denies changes in bowel habits, GI/GYN bleeding, recent fever or chills, chest pain, dyspnea, or other new concerns.  ONCOLOGY TREATMENT TEAM:  1. Surgeon:  Dr. Barry Dienes at [facility] 2. Medical Oncologist: Dr. Burr Medico  3. Radiation Oncologist: Dr. Lisbeth Renshaw    PAST MEDICAL/SURGICAL HISTORY:  Past Medical History:  Diagnosis Date  . Allergy   . Arthritis   . Breast cancer ([place]) 08/2019   right breast IDC  . Cataract   . Complication of anesthesia    unable to void after surgery and had to stay overnight   . Diabetes  mellitus without complication ([place])   . Family history of adverse reaction to anesthesia    sister, hard to wake up   . Family history of lung cancer   . Family history of pancreatic cancer   . GERD (gastroesophageal reflux disease)   . Hypertension   . Sleep apnea    wears CPAP nightly  . Thyroid disease   . Ulcerative colitis [facility])    Past Surgical History:  Procedure Laterality Date  . BREAST LUMPECTOMY WITH RADIOACTIVE SEED AND SENTINEL LYMPH NODE BIOPSY Right 10/14/2019   Procedure: RIGHT BREAST LUMPECTOMY WITH RADIOACTIVE SEED AND SENTINEL LYMPH NODE BIOPSY;  Surgeon: [name], MD;  Location: [place];  Service: General;  Laterality: Right;  . CHOLECYSTECTOMY    . RE-EXCISION OF BREAST LUMPECTOMY Right 11/11/2019   Procedure: RIGHT RE-EXCISION OF BREAST LUMPECTOMY;  Surgeon: [name], MD;  Location: [place];  Service: General;  Laterality: Right;  RNFA  . SKIN SURGERY  12/2015   Fatty tumor removed  . TONSILLECTOMY AND ADENOIDECTOMY  1956     ALLERGIES:  Allergies  Allergen Reactions  . Nickel Other (See Comments) and Rash    drainage  . Sulfa Antibiotics Rash  . Bydureon [Exenatide] Nausea And Vomiting     CURRENT MEDICATIONS:  Outpatient Encounter Medications as of 05/06/2020  Medication Sig  . anastrozole (ARIMIDEX) 1 MG tablet Take 1 tablet (1 mg total) by mouth daily.  Marland Kitchen aspirin EC 81 MG tablet Take 1 tablet by mouth daily.  Marland Kitchen atorvastatin (LIPITOR) 10 MG tablet Take 1 tablet (10 mg total) by mouth every evening.  . Biotin 2500 MCG CAPS Take 1 capsule by mouth daily.  . Cholecalciferol (VITAMIN D3) 2000 units capsule Take 1 capsule by mouth daily.  . dapagliflozin propanediol (FARXIGA) 10 MG TABS tablet Take 1 tablet (10 mg total) by mouth daily.  . famotidine (PEPCID) 40 MG tablet Take 40 mg by mouth 2 (two) times daily.  Marland Kitchen levothyroxine (SYNTHROID) 112 MCG tablet Take 1 tablet (112 mcg total) by mouth daily.  .  melatonin 5 MG TABS Take 5 mg by mouth at bedtime as needed.  . mesalamine (LIALDA) 1.2 g EC tablet Take by mouth. Take 2 tablet in am and 2 tablet in pm  . metFORMIN (GLUCOPHAGE) 1000 MG tablet Take 1 tablet (1,000 mg total) by mouth 2 (two) times daily with a meal.  . Misc Natural Products (NEURIVA PO) Take by mouth.  . Misc Natural Products (TURMERIC CURCUMIN) CAPS Take 1 capsule by mouth daily.  . Multiple Vitamin (MULTIVITAMIN) tablet Take 1 tablet by mouth daily.  . ramipril (ALTACE) 10 MG capsule Take 1 capsule (10 mg total) by mouth daily.   No facility-administered encounter medications on file as of 05/06/2020.     ONCOLOGIC FAMILY HISTORY:  Family History  Problem Relation Age of Onset  . Ulcerative colitis Mother   . Diabetes Mother   . Heart disease Father   . Lung cancer Father 24       Lung  . Varicose Veins Sister   . Other Sister        "  stiff heart"  . Diabetes Brother   . Pancreatic cancer Brother 13       Pancreatic  . Diabetes Maternal Aunt   . Diabetes Maternal Uncle   . Heart attack Paternal Grandfather   . Crohn's disease Brother   . Ulcerative colitis Brother   . Diabetes Brother   . Cancer Brother        unsure of type  . Lung cancer Paternal Aunt        smoker  . Cancer Paternal Aunt        unknown type     GENETIC COUNSELING/TESTING: Yes, no pathogenic mutation.  VUS and MLH1  SOCIAL HISTORY:  Michelle Sherman is married and lives with her spouse for whom she is the primary caregiver.  She denies any current or history of tobacco, alcohol, or illicit drug use.     PHYSICAL EXAMINATION:  Vital Signs:   Vitals:   05/06/20 1230  BP: 124/62  Pulse: 73  Resp: 20  Temp: (!) 97.2 F (36.2 C)  SpO2: 100%   Filed Weights   05/06/20 1230  Weight: 201 lb 11.2 oz (91.5 kg)   General: Well-nourished, well-appearing female in no acute distress.   HEENT:  Sclerae anicteric.  Lymph: No cervical, supraclavicular, or infraclavicular  lymphadenopathy noted on palpation.  Respiratory: breathing non-labored.  Neuro: No focal deficits. Steady gait.  Psych: Mood and affect normal and appropriate for situation.  Extremities: No edema. MSK: No focal spinal tenderness to palpation.  Full range of motion in bilateral upper extremities Skin: Warm and dry.  Breast: No bilateral nipple discharge or inversion.  S/p right lumpectomy, incisions completely healed. ?  Very mild right breast/chest wall lymphedema. No palpable mass in either breast or axilla that I could appreciate.  LABORATORY DATA:  None for this visit.  DIAGNOSTIC IMAGING:  None for this visit.      ASSESSMENT AND PLAN:  Ms.. Navarrette is a pleasant 70 y.o. female with Stage 1A right breast invasive ductal carcinoma, ER+/PR+/HER2-, diagnosed in 08/2019, treated with lumpectomy, adjuvant radiation therapy, and anti-estrogen therapy with anastrozole beginning in 01/2020.  She presents to the Survivorship Clinic for our initial meeting and routine follow-up post-completion of treatment for breast cancer.    1. Stage 1A right breast cancer:  Ms. Swindle is continuing to recover from definitive treatment for breast cancer. She will follow-up with her medical oncologist, Dr. Burr Medico in 07/2020 with history and physical exam per surveillance protocol.  She will continue her anti-estrogen therapy with anastrozole. Thus far, she is tolerating moderately well with hot flashes and joint pain.  She declined medical management for hot flashes.  She remains functional and is willing to continue AI.  She was instructed to make Dr. Burr Medico or myself aware if she begins to experience any worsening side effects of the medication and I could see her back in clinic to help manage those side effects, as needed. Today, a comprehensive survivorship care plan and treatment summary was reviewed with the patient today detailing her breast cancer diagnosis, treatment course, potential late/long-term effects of  treatment, appropriate follow-up care with recommendations for the future, and patient education resources.  A copy of this summary, along with a letter will be sent to the patient's primary care provider via In [employer]asket message after today's visit.   2. Bone health:  Given Ms. Bellot's age/history of breast cancer and her current treatment regimen including anti-estrogen therapy with anastrozole, she is at risk for bone  demineralization.  Her last DEXA scan was normal, she is scheduled for repeat on 06/2020. in the meantime, she was encouraged to continue daily vitamin D as well as increase her weight-bearing activities.  She was given education on specific activities to promote bone health.  3. Cancer screening:  Due to Ms. Dierolf's history and her age, she should receive screening for skin cancers and colon cancer.  The information and recommendations are listed on the patient's comprehensive care plan/treatment summary and were reviewed in detail with the patient.    4. Health maintenance and wellness promotion: Ms. Puskas was encouraged to consume 5-7 servings of fruits and vegetables per day. We reviewed the "Nutrition Rainbow" handout, as well as the handout "Take Control of Your Health and Reduce Your Cancer Risk" from the [place] [facility].  She was also encouraged to engage in moderate to vigorous exercise for 30 minutes per day most days of the week. We discussed the [employer] fitness program, which is designed for cancer survivors to help them become more physically fit after cancer treatments.  She was instructed to limit her alcohol consumption and continue to abstain from tobacco use.   5. Support services/counseling: It is not uncommon for this period of the patient's cancer care trajectory to be one of many emotions and stressors.  We discussed an opportunity for her to participate in the next session of [facility] ("Finding Your New Normal") support group series designed for patients  after they have completed treatment.   Ms. Labella was encouraged to take advantage of our many other support services programs, support groups, and/or counseling in coping with her new life as a cancer survivor after completing anti-cancer treatment.  She was offered support today through active listening and expressive supportive counseling.  She was given information regarding our available services and encouraged to contact me with any questions or for help enrolling in any of our support group/programs.    Dispo:   -Return to cancer center 07/2020 -Mammogram due in 08/2020 -Follow up with surgery 09/2020 -Referral to PT -She is welcome to return back to the Survivorship Clinic at any time; no additional follow-up needed at this time.  -Consider referral back to survivorship as a long-term survivor for continued surveillance  Orders Placed This Encounter  Procedures  . MM DIAG BREAST TOMO BILATERAL    Standing Status:   Future    Standing Expiration Date:   05/06/2021    Order Specific Question:   Reason for Exam (SYMPTOM  OR DIAGNOSIS REQUIRED)    Answer:   h/o right breast cancer 08/2019 s/p lumpectomy and RT, on AI    Order Specific Question:   Preferred imaging location?    Answer:   [facility]  . Ambulatory referral to Physical Therapy    Referral Priority:   Routine    Referral Type:   Physical Medicine    Referral Reason:   Specialty Services Required    Requested Specialty:   Physical Therapy    Number of Visits Requested:   1    A total of (40) minutes of face-to-face time was spent with this patient with greater than 50% of that time in counseling and care-coordination.   Cira Rue, NP Survivorship Program [facility] [phone]   Note: PRIMARY CARE PROVIDER Leamon Arnt, [place] [phone]

## 2020-05-20 ENCOUNTER — Ambulatory Visit: Payer: Medicare Other

## 2020-05-20 ENCOUNTER — Other Ambulatory Visit: Payer: Self-pay

## 2020-05-20 DIAGNOSIS — R600 Localized edema: Secondary | ICD-10-CM

## 2020-05-20 DIAGNOSIS — Z483 Aftercare following surgery for neoplasm: Secondary | ICD-10-CM | POA: Diagnosis not present

## 2020-05-20 DIAGNOSIS — R293 Abnormal posture: Secondary | ICD-10-CM | POA: Diagnosis not present

## 2020-05-20 NOTE — Patient Instructions (Signed)
Pt. Given referral to purchase a compression bra at [employer].  She will call to make an appt.

## 2020-05-20 NOTE — Therapy (Signed)
[place], [place], [address] Phone: [phone]   Fax:  [phone]  Physical Therapy Evaluation  Patient Details  Name: Michelle Sherman MRN: [id number] Date of Birth: 06-16-1950 Referring Provider (PT): Cira Rue   Encounter Date: 05/20/2020   PT End of Session - 05/20/20 1241    Visit Number 1    Number of Visits 12    Date for PT Re-Evaluation 07/01/20    PT Start Time 1110    PT Stop Time 1155    PT Time Calculation (min) 45 min    Activity Tolerance Patient tolerated treatment well    Behavior During Therapy [facility] for tasks assessed/performed           Past Medical History:  Diagnosis Date  . Allergy   . Arthritis   . Breast cancer ([place]) 08/2019   right breast IDC  . Cataract   . Complication of anesthesia    unable to void after surgery and had to stay overnight   . Diabetes mellitus without complication ([place])   . Family history of adverse reaction to anesthesia    sister, hard to wake up   . Family history of lung cancer   . Family history of pancreatic cancer   . GERD (gastroesophageal reflux disease)   . Hypertension   . Sleep apnea    wears CPAP nightly  . Thyroid disease   . Ulcerative colitis [facility])     Past Surgical History:  Procedure Laterality Date  . BREAST LUMPECTOMY WITH RADIOACTIVE SEED AND SENTINEL LYMPH NODE BIOPSY Right 10/14/2019   Procedure: RIGHT BREAST LUMPECTOMY WITH RADIOACTIVE SEED AND SENTINEL LYMPH NODE BIOPSY;  Surgeon: [name], MD;  Location: [place];  Service: General;  Laterality: Right;  . CHOLECYSTECTOMY    . RE-EXCISION OF BREAST LUMPECTOMY Right 11/11/2019   Procedure: RIGHT RE-EXCISION OF BREAST LUMPECTOMY;  Surgeon: [name], MD;  Location: [place];  Service: General;  Laterality: Right;  RNFA  . SKIN SURGERY  12/2015   Fatty tumor removed  . TONSILLECTOMY AND ADENOIDECTOMY  1956    There  were no vitals filed for this visit.    Subjective Assessment - 05/20/20 1111    Subjective  Pt. reports her Rt breast has been feeling heavy and is still larger than her Lt breast since completing radiation in December . It feels fuller on the lateral side than the right.  Right shoulder has been doing really well.  The breast is really tender still especially on the lateral side. I have tried wearing a sports bra but the band bothers me and it still feels heavier. I am having issues with all of my joints right now but I don't know why.    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 She had a right lumpectomy and sentinel node biopsy (2 negative nodes) on 10/14/2019. It is ER/PR positive and HER2 negative with a Ki67 of 5%. She cares for her husband who is in a motorized wheelchair and has bilateral BKA.    Patient Stated Goals Help with breast swelling    Currently in Pain? No/denies    Pain Score 0-No pain              [facility] PT Assessment - 05/20/20 0001      Assessment   Medical Diagnosis s/p right lumpectomy and SLNB    Referring Provider (PT)  Cira Rue    Onset Date/Surgical Date 10/14/19    Hand Dominance Right      Precautions   Precaution Comments recent surgery and right arm lymphedema risk      Balance Screen   Has the patient fallen in the past 6 months No    Has the patient had a decrease in activity level because of a fear of falling?  No    Is the patient reluctant to leave their home because of a fear of falling?  No      Home Ecologist residence    Living Arrangements Spouse/significant other    Available Help at Discharge Family      Prior Function   Level of Independence Independent    Vocation Unemployed    Leisure trying to walk more      Cognition   Overall Cognitive Status Within Functional Limits for tasks assessed      Observation/Other  Assessments   Observations right breast incisions healed but still tender at proximal incision.  Right breast appears  larger but no noticeable areas of palpable  fibrosis .  Tender at proximal incisio     AROM   Right/Left Shoulder Right    Right Shoulder Extension 63 Degrees    Right Shoulder Flexion 154 Degrees    Right Shoulder ABduction 170 Degrees             LYMPHEDEMA/ONCOLOGY QUESTIONNAIRE - 05/20/20 0001      Type   Cancer Type Right breast cancer      Surgeries   Lumpectomy Date 10/14/19    Sentinel Lymph Node Biopsy Date 10/14/19    Other Surgery Date 11/11/19   reexcision for clearer margins   Number Lymph Nodes Removed 2      Treatment   Active Chemotherapy Treatment No    Past Chemotherapy Treatment No    Active Radiation Treatment No    Past Radiation Treatment Yes    Date 01/20/20    Current Hormone Treatment Yes    Drug Name Anastrazole    Past Hormone Therapy No      What other symptoms do you have   Are you Having Heaviness or Tightness Yes    Are you having Pain Yes   knees, all joints   Are you having pitting edema No    Is it Hard or Difficult finding clothes that fit No    Do you have infections No    Is there Decreased scar mobility No      Right Upper Extremity Lymphedema   15 cm Proximal to Olecranon Process 37 cm    10 cm Proximal to Olecranon Process 35.5 cm    Olecranon Process 27.5 cm    15 cm Proximal to Ulnar Styloid Process 27.4 cm    10 cm Proximal to Ulnar Styloid Process 25 cm    Just Proximal to Ulnar Styloid Process 16.2 cm    Across Hand at [employer] 19.4 cm    At [place] of 2nd Digit 5.8 cm      Left Upper Extremity Lymphedema   15 cm Proximal to Olecranon Process 36 cm    10 cm Proximal to Olecranon Process 33.6 cm    Olecranon Process 26.7 cm    15 cm Proximal to Ulnar Styloid Process 25.5 cm    10 cm Proximal to Ulnar Styloid Process 23.3 cm    Just Proximal to Ulnar Styloid Process 16.2 cm  Across Hand at  [employer] 19 cm    At [place] of 2nd Digit 6 cm                   Objective measurements completed on examination: See above findings.               PT Education - 05/20/20 1208    Education Details Pt was educated in benefit of purchasing a compression bra, and was given script as well    Person(s) Educated Patient    Methods Handout    Comprehension Verbalized understanding               PT Long Term Goals - 05/20/20 1249      PT LONG TERM GOAL #1   Title Pt will report 50% reduction in right breast heaviness    Time 6    Period Weeks    Status New    Target Date 07/01/20      PT LONG TERM GOAL #2   Title Pt will purchase compression bra and be independent in its use    Time 6    Period Weeks    Status New    Target Date 07/01/20      PT LONG TERM GOAL #3   Title Pt will be independent in self breast MLD    Time 6    Period Weeks    Status New    Target Date 07/01/20                  Plan - 05/20/20 1242    Clinical Impression Statement Pt presents to therapy today reporting continued right breast swelling/heaviness that has not reduced since she completed her radiation in December.  She notes tenderness remains at the axillary incision. She has tried wearing sports bras but the band bothers her lower incision and doesn't seem to change the breast heaviness.  She is also complaining of multi joint pain and she was advised to consult MD to see if this may be a sideeffect of her anastrazole.  She will benefit from a proper compression bra, as well as instruction in self breast MLD.  With UE circumferential measurements she was nearly 2 cm larger at the right 15 cm prox to ulna styloid process although there were no other concerning measurements.  We will keep an eye on this    Personal Factors and Comorbidities Comorbidity 1    Comorbidities right lumpectomy with SLNB    Examination-Activity Limitations Caring for  Others;Lift;Sleep;Reach Overhead    Examination-Participation Restrictions Cleaning    Stability/Clinical Decision Making Stable/Uncomplicated    Rehab Potential Excellent    PT Frequency 2x / week    PT Duration 6 weeks    PT Treatment/Interventions ADLs/Self Care Home Management;Therapeutic exercise;Manual techniques;Patient/family education;Manual lymph drainage    PT Next Visit Plan see if pt purchased compression bra, perform and instruct self MLD    Recommended Other Services compression bra    Consulted and Agree with Plan of Care Patient           Patient will benefit from skilled therapeutic intervention in order to improve the following deficits and impairments:  Increased edema,Postural dysfunction  Visit Diagnosis: Aftercare following surgery for neoplasm  Localized edema  Abnormal posture     Problem List Patient Active Problem List   Diagnosis Date Noted  . Genetic testing 10/06/2019  . Family history of pancreatic cancer   . Family history of  lung cancer   . Malignant neoplasm of upper-outer quadrant of right breast in female, estrogen receptor positive ([place]) 09/18/2019  . Incomplete uterovaginal prolapse 04/03/2019  . 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 Plantar fasciitis 05/06/2017  . Controlled type 2 diabetes mellitus without complication, without long-term current use of insulin ([place]) 11/01/2016  . Hypertension associated with diabetes ([place]) 11/01/2016  . Severe obesity (BMI 35.0-35.9 with comorbidity) ([place]) 11/01/2016  . Combined hyperlipidemia associated with type 2 diabetes mellitus ([place]) 01/15/2012  . Allergic rhinitis 10/05/2011  . Ulcerative colitis without complications ([place]) 64/84/7207  . Diverticulosis of colon 10/17/2010  . Internal hemorrhoids 10/17/2010  . Acquired hypothyroidism 10/04/2010  . Osteoarthrosis of knee 10/04/2010  . OSA on CPAP 06/28/2010    Claris Pong 05/20/2020, 12:51 PM  [place], [place], [address] Phone: [phone]   Fax:  [phone]  Name: KARLE DESROSIER MRN: [id number] Date of Birth: 10-13-1950 Cheral Almas, PT 05/20/20 12:54 PM

## 2020-05-31 ENCOUNTER — Other Ambulatory Visit: Payer: Self-pay

## 2020-05-31 ENCOUNTER — Ambulatory Visit: Payer: Medicare Other | Attending: General Surgery

## 2020-05-31 DIAGNOSIS — M25511 Pain in right shoulder: Secondary | ICD-10-CM | POA: Diagnosis not present

## 2020-05-31 DIAGNOSIS — G8929 Other chronic pain: Secondary | ICD-10-CM | POA: Diagnosis not present

## 2020-05-31 DIAGNOSIS — R293 Abnormal posture: Secondary | ICD-10-CM | POA: Diagnosis not present

## 2020-05-31 DIAGNOSIS — R600 Localized edema: Secondary | ICD-10-CM | POA: Insufficient documentation

## 2020-05-31 DIAGNOSIS — Z170 Estrogen receptor positive status [ER+]: Secondary | ICD-10-CM | POA: Insufficient documentation

## 2020-05-31 DIAGNOSIS — C50411 Malignant neoplasm of upper-outer quadrant of right female breast: Secondary | ICD-10-CM | POA: Diagnosis not present

## 2020-05-31 DIAGNOSIS — Z483 Aftercare following surgery for neoplasm: Secondary | ICD-10-CM | POA: Diagnosis not present

## 2020-05-31 NOTE — Patient Instructions (Signed)
Pt was given written instructions for self Right breast MLD

## 2020-05-31 NOTE — Therapy (Signed)
[facility] [address] [place], [place], [address] Phone: [phone]   Fax:  [phone]  Physical Therapy Treatment  Patient Details  Name: Michelle Sherman MRN: [id number] Date of Birth: 03/28/1950 Referring Provider (PT): Lacie Burton   Encounter Date: 05/31/2020   PT End of Session - 05/31/20 1748    Visit Number 2    Number of Visits 12    Date for PT Re-Evaluation 07/01/20    PT Start Time 1305    PT Stop Time 1357    PT Time Calculation (min) 52 min    Activity Tolerance Patient tolerated treatment well    Behavior During Therapy WFL for tasks assessed/performed           Past Medical History:  Diagnosis Date  . Allergy   . Arthritis   . Breast cancer ([facility]) 08/2019   right breast IDC  . Cataract   . Complication of anesthesia    unable to void after surgery and had to stay overnight   . Diabetes mellitus without complication ([facility])   . Family history of adverse reaction to anesthesia    sister, hard to wake up   . Family history of lung cancer   . Family history of pancreatic cancer   . GERD (gastroesophageal reflux disease)   . Hypertension   . Sleep apnea    wears CPAP nightly  . Thyroid disease   . Ulcerative colitis ([facility])     Past Surgical History:  Procedure Laterality Date  . BREAST LUMPECTOMY WITH RADIOACTIVE SEED AND SENTINEL LYMPH NODE BIOPSY Right 10/14/2019   Procedure: RIGHT BREAST LUMPECTOMY WITH RADIOACTIVE SEED AND SENTINEL LYMPH NODE BIOPSY;  Surgeon: Byerly, Faera, MD;  Location: [facility];  Service: General;  Laterality: Right;  . CHOLECYSTECTOMY    . RE-EXCISION OF BREAST LUMPECTOMY Right 11/11/2019   Procedure: RIGHT RE-EXCISION OF BREAST LUMPECTOMY;  Surgeon: Byerly, Faera, MD;  Location: [facility];  Service: General;  Laterality: Right;  RNFA  . SKIN SURGERY  12/2015   Fatty tumor removed  . TONSILLECTOMY AND ADENOIDECTOMY  1956    There were  no vitals filed for this visit.   Subjective Assessment - 05/31/20 1305    Subjective Right breast doesn't feel as bad as it did. I called about a compression bra and scheduled an appt, but I had to reschedule it for Thursday.    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 She had a right lumpectomy and sentinel node biopsy (2 negative nodes) on 10/14/2019. It is ER/PR positive and HER2 negative with a Ki67 of 5%. She cares for her husband who is in a motorized wheelchair and has bilateral BKA.    Patient Stated Goals Help with breast swelling    Currently in Pain? No/denies    Pain Score 0-No pain                             [facility] Adult PT Treatment/Exercise - 05/31/20 0001      Manual Therapy   Manual Lymphatic Drainage (MLD) therapist performed MLD to short neck 5 diaphragmatic breaths, bilateral axillary LN's, right inguinal LN's, Anterior interaxillary anastomosis, right axillo-inguinal pathway, and right breast retracing all pathways.  Pt then practiced same and required occasional VC'sand assist, but did very well overall.  She was given written instructions                    PT Education - 05/31/20 1747    Education Details Pt was educated in self Right breast MLD and did well but will require review.  Handout with pictures given.    Person(s) Educated Patient    Methods Explanation;Demonstration;Handout    Comprehension Verbalized understanding;Returned demonstration;Verbal cues required;Need further instruction;Tactile cues required               PT Long Term Goals - 05/20/20 1249      PT LONG TERM GOAL #1   Title Pt will report 50% reduction in right breast heaviness    Time 6    Period Weeks    Status New    Target Date 07/01/20      PT LONG TERM GOAL #2   Title Pt will purchase compression bra and be independent in its use    Time 6    Period Weeks    Status New     Target Date 07/01/20      PT LONG TERM GOAL #3   Title Pt will be independent in self breast MLD    Time 6    Period Weeks    Status New    Target Date 07/01/20                 Plan - 05/31/20 1749    Clinical Impression Statement Therapy consisted of therapist performance of right breast MLD and pt instruction in self MLD.  She did very well overall and was given written instructions to practice at home.  She required occasional VC's and assist, but had a good understanding overall.  Breast does not appear or feel as swollen today. Pt has an appt on Thursday to be fit for a compression bra which should be more comfortable for her.    Personal Factors and Comorbidities Comorbidity 1    Comorbidities right lumpectomy with SLNB    Examination-Activity Limitations Caring for Others;Lift;Sleep;Reach Overhead    Examination-Participation Restrictions Cleaning    Stability/Clinical Decision Making Stable/Uncomplicated    Rehab Potential Excellent    PT Frequency 2x / week    PT Duration 6 weeks    PT Treatment/Interventions ADLs/Self Care Home Management;Therapeutic exercise;Manual techniques;Patient/family education;Manual lymph drainage    PT Next Visit Plan see if pt purchased compression bra, perform and instruct self MLD    Consulted and Agree with Plan of Care Patient           Patient will benefit from skilled therapeutic intervention in order to improve the following deficits and impairments:  Increased edema,Postural dysfunction,Decreased knowledge of precautions  Visit Diagnosis: Aftercare following surgery for neoplasm  Localized edema  Abnormal posture  Malignant neoplasm of upper-outer quadrant of right breast in female, estrogen receptor positive ([place])     Problem List Patient Active Problem List   Diagnosis Date Noted  . Genetic testing 10/06/2019  . Family history of pancreatic cancer   . Family history of lung cancer   . Malignant neoplasm of  upper-outer quadrant of right breast in female, estrogen receptor positive ([place]) 09/18/2019  . Incomplete uterovaginal prolapse 04/03/2019  . 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 Plantar fasciitis 05/06/2017  . Controlled type 2 diabetes mellitus without complication, without long-term current use of insulin ([place]) 11/01/2016  . Hypertension associated with diabetes ([place]) 11/01/2016  . Severe obesity (BMI 35.0-35.9 with comorbidity) ([place]) 11/01/2016  . Combined hyperlipidemia associated with type 2 diabetes mellitus ([place]) 01/15/2012  . Allergic rhinitis 10/05/2011  . Ulcerative colitis without complications ([place]) 69/48/5462  .  Diverticulosis of colon 10/17/2010  . Internal hemorrhoids 10/17/2010  . Acquired hypothyroidism 10/04/2010  . Osteoarthrosis of knee 10/04/2010  . OSA on CPAP 06/28/2010    Claris Pong 05/31/2020, 5:53 PM  [place], [place], [address] Phone: [phone]   Fax:  [phone]  Name: JEROME OTTER MRN: [id number] Date of Birth: 1950/03/03 Cheral Almas, PT 05/31/20 5:54 PM

## 2020-06-01 ENCOUNTER — Telehealth: Payer: Self-pay | Admitting: Family Medicine

## 2020-06-01 NOTE — Chronic Care Management (AMB) (Signed)
  Chronic Care Management   Outreach Note  06/01/2020 Name: Michelle Sherman MRN: [id number] DOB: 09/23/1950  Referred by: Leamon Arnt, MD Reason for referral : No chief complaint on file.   An unsuccessful telephone outreach was attempted today. The patient was referred to the pharmacist for assistance with care management and care coordination.   Follow Up Plan:   Lauretta Grill Upstream Scheduler

## 2020-06-02 ENCOUNTER — Telehealth: Payer: Self-pay | Admitting: Family Medicine

## 2020-06-02 ENCOUNTER — Other Ambulatory Visit: Payer: Self-pay

## 2020-06-02 ENCOUNTER — Ambulatory Visit: Payer: Medicare Other

## 2020-06-02 DIAGNOSIS — M25511 Pain in right shoulder: Secondary | ICD-10-CM | POA: Diagnosis not present

## 2020-06-02 DIAGNOSIS — G8929 Other chronic pain: Secondary | ICD-10-CM | POA: Diagnosis not present

## 2020-06-02 DIAGNOSIS — C50411 Malignant neoplasm of upper-outer quadrant of right female breast: Secondary | ICD-10-CM | POA: Diagnosis not present

## 2020-06-02 DIAGNOSIS — R293 Abnormal posture: Secondary | ICD-10-CM | POA: Diagnosis not present

## 2020-06-02 DIAGNOSIS — R600 Localized edema: Secondary | ICD-10-CM | POA: Diagnosis not present

## 2020-06-02 DIAGNOSIS — Z483 Aftercare following surgery for neoplasm: Secondary | ICD-10-CM

## 2020-06-02 DIAGNOSIS — Z170 Estrogen receptor positive status [ER+]: Secondary | ICD-10-CM | POA: Diagnosis not present

## 2020-06-02 NOTE — Chronic Care Management (AMB) (Signed)
  Chronic Care Management   Note  06/02/2020 Name: Michelle Sherman MRN: [id number] DOB: 13-Apr-1950  Michelle Sherman is a 70 y.o. year old female who is a primary care patient of Leamon Arnt, MD. I reached out to [name] by phone today in response to a referral sent by Michelle Sherman's PCP, Leamon Arnt, MD.   Michelle Sherman was given information about Chronic Care Management services today including:  1. CCM service includes personalized support from designated clinical staff supervised by her physician, including individualized plan of care and coordination with other care providers 2. 24/7 contact phone numbers for assistance for urgent and routine care needs. 3. Service will only be billed when office clinical staff spend 20 minutes or more in a month to coordinate care. 4. Only one practitioner may furnish and bill the service in a calendar month. 5. The patient may stop CCM services at any time (effective at the end of the month) by phone call to the office staff.   Patient agreed to services and verbal consent obtained.   Follow up plan:   Lauretta Grill Upstream Scheduler

## 2020-06-02 NOTE — Therapy (Signed)
[place], [place], [address] Phone: [phone]   Fax:  [phone]  Physical Therapy Treatment  Patient Details  Name: Michelle Sherman MRN: [id number] Date of Birth: 12/20/1950 Referring Provider (PT): Cira Rue   Encounter Date: 06/02/2020   PT End of Session - 06/02/20 1345    Visit Number 3    Number of Visits 12    Date for PT Re-Evaluation 07/01/20    PT Start Time 6195    PT Stop Time 1351    PT Time Calculation (min) 46 min    Activity Tolerance Patient tolerated treatment well    Behavior During Therapy [facility] for tasks assessed/performed           Past Medical History:  Diagnosis Date  . Allergy   . Arthritis   . Breast cancer ([place]) 08/2019   right breast IDC  . Cataract   . Complication of anesthesia    unable to void after surgery and had to stay overnight   . Diabetes mellitus without complication ([place])   . Family history of adverse reaction to anesthesia    sister, hard to wake up   . Family history of lung cancer   . Family history of pancreatic cancer   . GERD (gastroesophageal reflux disease)   . Hypertension   . Sleep apnea    wears CPAP nightly  . Thyroid disease   . Ulcerative colitis [facility])     Past Surgical History:  Procedure Laterality Date  . BREAST LUMPECTOMY WITH RADIOACTIVE SEED AND SENTINEL LYMPH NODE BIOPSY Right 10/14/2019   Procedure: RIGHT BREAST LUMPECTOMY WITH RADIOACTIVE SEED AND SENTINEL LYMPH NODE BIOPSY;  Surgeon: [name], MD;  Location: [place];  Service: General;  Laterality: Right;  . CHOLECYSTECTOMY    . RE-EXCISION OF BREAST LUMPECTOMY Right 11/11/2019   Procedure: RIGHT RE-EXCISION OF BREAST LUMPECTOMY;  Surgeon: [name], MD;  Location: [place];  Service: General;  Laterality: Right;  RNFA  . SKIN SURGERY  12/2015   Fatty tumor removed  . TONSILLECTOMY AND ADENOIDECTOMY  1956    There  were no vitals filed for this visit.   Subjective Assessment - 06/02/20 1305    Subjective Breast swelling seems to be better.  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 She had a right lumpectomy and sentinel node biopsy (2 negative nodes) on 10/14/2019. It is ER/PR positive and HER2 negative with a Ki67 of 5%. She cares for her husband who is in a motorized wheelchair and has bilateral BKA.    Currently in Pain? No/denies    Pain Score 0-No pain                             [facility] Adult PT Treatment/Exercise - 06/02/20 0001      Manual Therapy   Manual Lymphatic Drainage (MLD) therapist performed MLD to short neck 5 diaphragmatic breaths, bilateral axillary LN's, right inguinal LN's, Anterior interaxillary anastomosis, right axillo-inguinal pathway, and right breast retracing all pathways.  Pt then practiced same and required occasional VC'sand assist, but did very well overall.  She was given written instructions at previous session.                      PT Long Term Goals -  05/20/20 1249      PT LONG TERM GOAL #1   Title Pt will report 50% reduction in right breast heaviness    Time 6    Period Weeks    Status New    Target Date 07/01/20      PT LONG TERM GOAL #2   Title Pt will purchase compression bra and be independent in its use    Time 6    Period Weeks    Status New    Target Date 07/01/20      PT LONG TERM GOAL #3   Title Pt will be independent in self breast MLD    Time 6    Period Weeks    Status New    Target Date 07/01/20                 Plan - 06/02/20 1352    Clinical Impression Statement Therapist performedMLD to right breast with pt supine and head of table elevated and verbalized instructions to pt.  Pt. then performed self MLD to the right breast after activating LN's, pathways and breast and retracing steps.  She  did much better today and was able to verbalize and demonstrate improved understanding with improved technique.  Pt had no firm areas in breast today and pt feels overall better    Personal Factors and Comorbidities Comorbidity 1    Comorbidities right lumpectomy with SLNB    Examination-Activity Limitations Caring for Others;Lift;Sleep;Reach Overhead    Examination-Participation Restrictions Cleaning    Stability/Clinical Decision Making Stable/Uncomplicated    Rehab Potential Excellent    PT Frequency 2x / week    PT Treatment/Interventions ADLs/Self Care Home Management;Therapeutic exercise;Manual techniques;Patient/family education;Manual lymph drainage    PT Next Visit Plan see if pt purchased compression bra, perform and instruct/review self MLD    PT Home Exercise Plan Right breast MLD, also reinforced need to do wand exs and theraband as previously instructed    Consulted and Agree with Plan of Care Patient           Patient will benefit from skilled therapeutic intervention in order to improve the following deficits and impairments:  Increased edema,Postural dysfunction,Decreased knowledge of precautions  Visit Diagnosis: Aftercare following surgery for neoplasm  Localized edema  Abnormal posture  Malignant neoplasm of upper-outer quadrant of right breast in female, estrogen receptor positive ([place])  Chronic right shoulder pain     Problem List Patient Active Problem List   Diagnosis Date Noted  . Genetic testing 10/06/2019  . Family history of pancreatic cancer   . Family history of lung cancer   . Malignant neoplasm of upper-outer quadrant of right breast in female, estrogen receptor positive ([place]) 09/18/2019  . Incomplete uterovaginal prolapse 04/03/2019  . 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 Plantar fasciitis 05/06/2017  . Controlled type 2 diabetes mellitus without complication, without long-term current use of insulin ([place]) 11/01/2016  . Hypertension  associated with diabetes ([place]) 11/01/2016  . Severe obesity (BMI 35.0-35.9 with comorbidity) ([place]) 11/01/2016  . Combined hyperlipidemia associated with type 2 diabetes mellitus ([place]) 01/15/2012  . Allergic rhinitis 10/05/2011  . Ulcerative colitis without complications ([place]) 41/28/7867  . Diverticulosis of colon 10/17/2010  . Internal hemorrhoids 10/17/2010  . Acquired hypothyroidism 10/04/2010  . Osteoarthrosis of knee 10/04/2010  . OSA on CPAP 06/28/2010    Claris Pong 06/02/2020, 1:57 PM  [place], [place], [address] Phone: [phone]   Fax:  [phone]  Name: Michelle Sherman MRN: [id number] Date of Birth: 1951/01/05 Cheral Almas, PT 06/02/20 1:59 PM

## 2020-06-03 DIAGNOSIS — C50911 Malignant neoplasm of unspecified site of right female breast: Secondary | ICD-10-CM | POA: Diagnosis not present

## 2020-06-07 ENCOUNTER — Ambulatory Visit: Payer: Medicare Other

## 2020-06-07 ENCOUNTER — Other Ambulatory Visit: Payer: Self-pay

## 2020-06-07 DIAGNOSIS — Z170 Estrogen receptor positive status [ER+]: Secondary | ICD-10-CM

## 2020-06-07 DIAGNOSIS — R232 Flushing: Secondary | ICD-10-CM | POA: Diagnosis not present

## 2020-06-07 DIAGNOSIS — Z483 Aftercare following surgery for neoplasm: Secondary | ICD-10-CM | POA: Diagnosis not present

## 2020-06-07 DIAGNOSIS — G8929 Other chronic pain: Secondary | ICD-10-CM | POA: Diagnosis not present

## 2020-06-07 DIAGNOSIS — R600 Localized edema: Secondary | ICD-10-CM

## 2020-06-07 DIAGNOSIS — M25511 Pain in right shoulder: Secondary | ICD-10-CM

## 2020-06-07 DIAGNOSIS — T451X5A Adverse effect of antineoplastic and immunosuppressive drugs, initial encounter: Secondary | ICD-10-CM | POA: Diagnosis not present

## 2020-06-07 DIAGNOSIS — R293 Abnormal posture: Secondary | ICD-10-CM | POA: Diagnosis not present

## 2020-06-07 DIAGNOSIS — C50411 Malignant neoplasm of upper-outer quadrant of right female breast: Secondary | ICD-10-CM | POA: Diagnosis not present

## 2020-06-07 NOTE — Therapy (Signed)
[place], [place], [address] Phone: [phone]   Fax:  [phone]  Physical Therapy Treatment  Patient Details  Name: Michelle Sherman MRN: [id number] Date of Birth: 29-Sep-1950 Referring Provider (PT): Cira Rue   Encounter Date: 06/07/2020   PT End of Session - 06/07/20 1739    Visit Number 4    Number of Visits 12    Date for PT Re-Evaluation 07/01/20    PT Start Time 7412    PT Stop Time 1354    PT Time Calculation (min) 49 min    Activity Tolerance Patient tolerated treatment well    Behavior During Therapy [facility] for tasks assessed/performed           Past Medical History:  Diagnosis Date  . Allergy   . Arthritis   . Breast cancer ([place]) 08/2019   right breast IDC  . Cataract   . Complication of anesthesia    unable to void after surgery and had to stay overnight   . Diabetes mellitus without complication ([place])   . Family history of adverse reaction to anesthesia    sister, hard to wake up   . Family history of lung cancer   . Family history of pancreatic cancer   . GERD (gastroesophageal reflux disease)   . Hypertension   . Sleep apnea    wears CPAP nightly  . Thyroid disease   . Ulcerative colitis [facility])     Past Surgical History:  Procedure Laterality Date  . BREAST LUMPECTOMY WITH RADIOACTIVE SEED AND SENTINEL LYMPH NODE BIOPSY Right 10/14/2019   Procedure: RIGHT BREAST LUMPECTOMY WITH RADIOACTIVE SEED AND SENTINEL LYMPH NODE BIOPSY;  Surgeon: [name], MD;  Location: [place];  Service: General;  Laterality: Right;  . CHOLECYSTECTOMY    . RE-EXCISION OF BREAST LUMPECTOMY Right 11/11/2019   Procedure: RIGHT RE-EXCISION OF BREAST LUMPECTOMY;  Surgeon: [name], MD;  Location: [place];  Service: General;  Laterality: Right;  RNFA  . SKIN SURGERY  12/2015   Fatty tumor removed  . TONSILLECTOMY AND ADENOIDECTOMY  1956    There  were no vitals filed for this visit.   Subjective Assessment - 06/07/20 1303    Subjective I got my compression bras last thursday.  I want to try them on so you can see them I have been doing the MLD and think it is going well. My right breast doesn't feel heavy.  I saw Dr. Barry Dienes and she thinks everything looks good.   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 She had a right lumpectomy and sentinel node biopsy (2 negative nodes) on 10/14/2019. It is ER/PR positive and HER2 negative with a Ki67 of 5%. She cares for her husband who is in a motorized wheelchair and has bilateral BKA.    Limitations House hold activities    Currently in Pain? Yes    Pain Score 7     Pain Location Knee    Pain Orientation Right    Pain Descriptors / Indicators Sharp    Pain Type Chronic pain    Pain Onset More than a month ago                             [facility] Adult PT Treatment/Exercise - 06/07/20 0001      Manual Therapy   Manual therapy comments  pt tried on 3 different compression bra's to have therapist check. Adjusted straps,    Manual Lymphatic Drainage (MLD) Reviewed self MLD with pt performing all                       PT Long Term Goals - 05/20/20 1249      PT LONG TERM GOAL #1   Title Pt will report 50% reduction in right breast heaviness    Time 6    Period Weeks    Status New    Target Date 07/01/20      PT LONG TERM GOAL #2   Title Pt will purchase compression bra and be independent in its use    Time 6    Period Weeks    Status New    Target Date 07/01/20      PT LONG TERM GOAL #3   Title Pt will be independent in self breast MLD    Time 6    Period Weeks    Status New    Target Date 07/01/20                 Plan - 06/07/20 1739    Clinical Impression Statement Pt had 3 different bras to try on and assess.  She was comfortable in the one she wore in with a good  fit and the [employer] fit well.  The bra with narrower straps puckered at the bottom of the breast area even with the pad in place so she will likely return that one ans start with the 2 that she likes the best.  We reviewed self breast MLD and she did exceptionally well with good technique and understanding of swequence without looking at the paper.  We will cancel second appt this week and follow up next week    Personal Factors and Comorbidities Comorbidity 1    Comorbidities right lumpectomy with SLNB    Examination-Activity Limitations Caring for Others;Lift;Sleep;Reach Overhead    Examination-Participation Restrictions Cleaning    Stability/Clinical Decision Making Stable/Uncomplicated    Rehab Potential Excellent    PT Frequency 2x / week    PT Duration 6 weeks    PT Treatment/Interventions ADLs/Self Care Home Management;Therapeutic exercise;Manual techniques;Patient/family education;Manual lymph drainage    PT Next Visit Plan perform and review MLD,    PT Home Exercise Plan Right breast MLD, also reinforced need to do wand exs and theraband as previously instructed    Consulted and Agree with Plan of Care Patient           Patient will benefit from skilled therapeutic intervention in order to improve the following deficits and impairments:  Increased edema,Postural dysfunction,Decreased knowledge of precautions  Visit Diagnosis: Aftercare following surgery for neoplasm  Localized edema  Abnormal posture  Malignant neoplasm of upper-outer quadrant of right breast in female, estrogen receptor positive ([place])  Chronic right shoulder pain     Problem List Patient Active Problem List   Diagnosis Date Noted  . Genetic testing 10/06/2019  . Family history of pancreatic cancer   . Family history of lung cancer   . Malignant neoplasm of upper-outer quadrant of right breast in female, estrogen receptor positive ([place]) 09/18/2019  . Incomplete uterovaginal prolapse 04/03/2019   . 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 Plantar fasciitis 05/06/2017  . Controlled type 2 diabetes mellitus without complication, without long-term current use of insulin ([place]) 11/01/2016  . Hypertension associated with diabetes ([place]) 11/01/2016  . Severe obesity (  BMI 35.0-35.9 with comorbidity) ([place]) 11/01/2016  . Combined hyperlipidemia associated with type 2 diabetes mellitus ([place]) 01/15/2012  . Allergic rhinitis 10/05/2011  . Ulcerative colitis without complications ([place]) 95/63/8756  . Diverticulosis of colon 10/17/2010  . Internal hemorrhoids 10/17/2010  . Acquired hypothyroidism 10/04/2010  . Osteoarthrosis of knee 10/04/2010  . OSA on CPAP 06/28/2010    Claris Pong 06/07/2020, 5:44 PM  [place], [place], [address] Phone: [phone]   Fax:  [phone]  Name: Michelle Sherman MRN: [id number] Date of Birth: December 31, 1950 Cheral Almas, PT 06/07/20 5:45 PM

## 2020-06-14 ENCOUNTER — Other Ambulatory Visit: Payer: Self-pay

## 2020-06-14 ENCOUNTER — Ambulatory Visit: Payer: Medicare Other

## 2020-06-14 DIAGNOSIS — R293 Abnormal posture: Secondary | ICD-10-CM | POA: Diagnosis not present

## 2020-06-14 DIAGNOSIS — G8929 Other chronic pain: Secondary | ICD-10-CM

## 2020-06-14 DIAGNOSIS — R600 Localized edema: Secondary | ICD-10-CM

## 2020-06-14 DIAGNOSIS — M25511 Pain in right shoulder: Secondary | ICD-10-CM | POA: Diagnosis not present

## 2020-06-14 DIAGNOSIS — Z483 Aftercare following surgery for neoplasm: Secondary | ICD-10-CM | POA: Diagnosis not present

## 2020-06-14 DIAGNOSIS — C50411 Malignant neoplasm of upper-outer quadrant of right female breast: Secondary | ICD-10-CM | POA: Diagnosis not present

## 2020-06-14 DIAGNOSIS — Z170 Estrogen receptor positive status [ER+]: Secondary | ICD-10-CM | POA: Diagnosis not present

## 2020-06-14 NOTE — Therapy (Signed)
[place], [place], [address] Phone: [phone]   Fax:  [phone]  Physical Therapy Treatment  Patient Details  Name: Michelle Sherman MRN: [id number] Date of Birth: 22-Nov-1950 Referring Provider (PT): Cira Rue   Encounter Date: 06/14/2020   PT End of Session - 06/14/20 1354    Visit Number 5    Number of Visits 12    Date for PT Re-Evaluation 07/01/20    PT Start Time 1300    PT Stop Time 1353    PT Time Calculation (min) 53 min    Activity Tolerance Patient tolerated treatment well    Behavior During Therapy [facility] for tasks assessed/performed           Past Medical History:  Diagnosis Date  . Allergy   . Arthritis   . Breast cancer ([place]) 08/2019   right breast IDC  . Cataract   . Complication of anesthesia    unable to void after surgery and had to stay overnight   . Diabetes mellitus without complication ([place])   . Family history of adverse reaction to anesthesia    sister, hard to wake up   . Family history of lung cancer   . Family history of pancreatic cancer   . GERD (gastroesophageal reflux disease)   . Hypertension   . Sleep apnea    wears CPAP nightly  . Thyroid disease   . Ulcerative colitis [facility])     Past Surgical History:  Procedure Laterality Date  . BREAST LUMPECTOMY WITH RADIOACTIVE SEED AND SENTINEL LYMPH NODE BIOPSY Right 10/14/2019   Procedure: RIGHT BREAST LUMPECTOMY WITH RADIOACTIVE SEED AND SENTINEL LYMPH NODE BIOPSY;  Surgeon: [name], MD;  Location: [place];  Service: General;  Laterality: Right;  . CHOLECYSTECTOMY    . RE-EXCISION OF BREAST LUMPECTOMY Right 11/11/2019   Procedure: RIGHT RE-EXCISION OF BREAST LUMPECTOMY;  Surgeon: [name], MD;  Location: [place];  Service: General;  Laterality: Right;  RNFA  . SKIN SURGERY  12/2015   Fatty tumor removed  . TONSILLECTOMY AND ADENOIDECTOMY  1956    There  were no vitals filed for this visit.   Subjective Assessment - 06/14/20 1303    Subjective The bras feel good.  I exchanged the one I didn't like for a black prairie hugger. Swelling seems to be fine. and I have been doing the MLD every night.  I still feel tender at my incision but I don't feel any heaviness or firmness. I jolted my shoulder carrying a box in the door and it hurt all the way down into my knee.    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 She had a right lumpectomy and sentinel node biopsy (2 negative nodes) on 10/14/2019. It is ER/PR positive and HER2 negative with a Ki67 of 5%. She cares for her husband who is in a motorized wheelchair and has bilateral BKA.    Limitations House hold activities    Patient Stated Goals Help with breast swelling    Currently in Pain? Yes    Pain Score 7     Pain Location Knee    Pain Orientation Right    Pain Descriptors / Indicators Sharp    Pain Type Chronic pain    Pain Onset More than a month ago    Pain Frequency Intermittent  [place] Adult PT Treatment/Exercise - 06/14/20 0001      Manual Therapy   Manual Lymphatic Drainage (MLD) therapist performed MLD to short neck 5 diaphragmatic breaths, bilateral axillary LN's, right inguinal LN's, Anterior interaxillary anastomosis, right axillo-inguinal pathway, and right breast retracing all pathways.  Pt then practiced same and  did exceptionally well overall.                       PT Long Term Goals - 06/14/20 1314      PT LONG TERM GOAL #1   Title Pt will report 50% reduction in right breast heaviness    Time 6    Period Weeks    Status Achieved      PT LONG TERM GOAL #2   Title Pt will purchase compression bra and be independent in its use    Time 6    Period Weeks    Status Achieved      PT LONG TERM GOAL #3   Title Pt will be independent in self  breast MLD    Time 6    Period Weeks    Status Achieved                 Plan - 06/14/20 1355    Clinical Impression Statement Michelle Sherman feels very comfortable inher new compression bras.  She has been very compliant with performing self breast MLD and no longer feels breast heaviness or fibross.  She was able to return demonstrate Slef MLD with excellent technique today.  She has no palpable or visible swelling. She has achieved all goals established.  We will scheudle 1 visit in 2 weeks but if feeling good may call and cancel it and she will be discharged    Personal Factors and Comorbidities Comorbidity 1    Comorbidities right lumpectomy with SLNB    Examination-Activity Limitations Caring for Others;Lift;Sleep;Reach Overhead    Examination-Participation Restrictions Cleaning    Stability/Clinical Decision Making Stable/Uncomplicated    Rehab Potential Excellent    PT Frequency 2x / week    PT Duration 6 weeks    PT Treatment/Interventions ADLs/Self Care Home Management;Therapeutic exercise;Manual techniques;Patient/family education;Manual lymph drainage    PT Next Visit Plan perform and review MLD, discharge    PT Home Exercise Plan Right breast MLD, also reinforced need to do wand exs and theraband as previously instructed, compression bra's    Consulted and Agree with Plan of Care Patient           Patient will benefit from skilled therapeutic intervention in order to improve the following deficits and impairments:  Increased edema,Postural dysfunction,Decreased knowledge of precautions  Visit Diagnosis: Aftercare following surgery for neoplasm  Localized edema  Abnormal posture  Malignant neoplasm of upper-outer quadrant of right breast in female, estrogen receptor positive ([place])  Chronic right shoulder pain     Problem List Patient Active Problem List   Diagnosis Date Noted  . Genetic testing 10/06/2019  . Family history of pancreatic cancer   . Family  history of lung cancer   . Malignant neoplasm of upper-outer quadrant of right breast in female, estrogen receptor positive ([place]) 09/18/2019  . Incomplete uterovaginal prolapse 04/03/2019  . 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 Plantar fasciitis 05/06/2017  . Controlled type 2 diabetes mellitus without complication, without long-term current use of insulin ([place]) 11/01/2016  . Hypertension associated with diabetes ([place]) 11/01/2016  . Severe obesity (BMI 35.0-35.9 with comorbidity) ([place]) 11/01/2016  . Combined hyperlipidemia  associated with type 2 diabetes mellitus ([place]) 01/15/2012  . Allergic rhinitis 10/05/2011  . Ulcerative colitis without complications ([place]) 20/99/0689  . Diverticulosis of colon 10/17/2010  . Internal hemorrhoids 10/17/2010  . Acquired hypothyroidism 10/04/2010  . Osteoarthrosis of knee 10/04/2010  . OSA on CPAP 06/28/2010    Claris Pong 06/14/2020, 1:59 PM  [place], [place], [address] Phone: [phone]   Fax:  [phone]  Name: Michelle Sherman MRN: [id number] Date of Birth: [birth year] Cheral Almas, PT 06/14/20 2:01 PM

## 2020-06-29 ENCOUNTER — Ambulatory Visit: Payer: Medicare Other

## 2020-07-01 ENCOUNTER — Other Ambulatory Visit: Payer: Self-pay

## 2020-07-01 ENCOUNTER — Ambulatory Visit
Admission: RE | Admit: 2020-07-01 | Discharge: 2020-07-01 | Disposition: A | Discharge status: Home / Self Care | Payer: Medicare Other | Source: Ambulatory Visit | Attending: Hematology | Admitting: Hematology

## 2020-07-01 DIAGNOSIS — Z780 Asymptomatic menopausal state: Secondary | ICD-10-CM | POA: Diagnosis not present

## 2020-07-01 DIAGNOSIS — E2839 Other primary ovarian failure: Secondary | ICD-10-CM

## 2020-07-03 ENCOUNTER — Encounter: Payer: Self-pay | Admitting: Hematology

## 2020-07-07 ENCOUNTER — Ambulatory Visit: Payer: Medicare Other | Admitting: Family Medicine

## 2020-07-12 ENCOUNTER — Other Ambulatory Visit: Payer: Self-pay

## 2020-07-12 DIAGNOSIS — Z170 Estrogen receptor positive status [ER+]: Secondary | ICD-10-CM

## 2020-07-12 MED ORDER — ANASTROZOLE 1 MG PO TABS: 1.0000 mg | ORAL_TABLET | Freq: Every day | ORAL | 5 refills | Status: DC

## 2020-07-14 ENCOUNTER — Telehealth: Payer: Self-pay

## 2020-07-14 NOTE — Chronic Care Management (AMB) (Signed)
Chronic Care Management Pharmacy Assistant   Name: BRITTNI HULT  MRN: [id number] DOB: 1950-09-29  Michelle Sherman is an 70 y.o. year old female who presents for his initial CCM visit with the clinical pharmacist.  Reason for Encounter: Chart Prep/ IQ  Recent office visits:  No visits noted  Recent consult visits:  05/06/20- Cira Rue, NP (Oncology)- seen for routine follow-up post-treatment for a recent history of breast cancer, referral to PT, no follow up documented  01/29/20- Truitt Merle, MD (Oncology)- seen for f/u of right breast cancer, will start anastrozole 1 mg daily in 2-3 weeks, follow up 6 months   Hospital visits:  None in previous 6 months  Medications: Outpatient Encounter Medications as of 07/14/2020  Medication Sig   anastrozole (ARIMIDEX) 1 MG tablet Take 1 tablet (1 mg total) by mouth daily.   aspirin EC 81 MG tablet Take 1 tablet by mouth daily.   atorvastatin (LIPITOR) 10 MG tablet Take 1 tablet (10 mg total) by mouth every evening.   Biotin 2500 MCG CAPS Take 1 capsule by mouth daily.   Cholecalciferol (VITAMIN D3) 2000 units capsule Take 1 capsule by mouth daily.   dapagliflozin propanediol (FARXIGA) 10 MG TABS tablet Take 1 tablet (10 mg total) by mouth daily.   famotidine (PEPCID) 40 MG tablet Take 40 mg by mouth 2 (two) times daily.   levothyroxine (SYNTHROID) 112 MCG tablet Take 1 tablet (112 mcg total) by mouth daily.   melatonin 5 MG TABS Take 5 mg by mouth at bedtime as needed.   mesalamine (LIALDA) 1.2 g EC tablet Take by mouth. Take 2 tablet in am and 2 tablet in pm   metFORMIN (GLUCOPHAGE) 1000 MG tablet Take 1 tablet (1,000 mg total) by mouth 2 (two) times daily with a meal.   Misc Natural Products (NEURIVA PO) Take by mouth.   Misc Natural Products (TURMERIC CURCUMIN) CAPS Take 1 capsule by mouth daily.   Multiple Vitamin (MULTIVITAMIN) tablet Take 1 tablet by mouth daily.   ramipril (ALTACE) 10 MG capsule Take 1 capsule (10 mg total) by  mouth daily.   No facility-administered encounter medications on file as of 07/14/2020.   Current Documented Medications- Patient receives through mail order pharmacy  anastrozole (ARIMIDEX) 1 MG tablet aspirin EC 81 MG tablet atorvastatin (LIPITOR) 10 MG tablet (Expired) Biotin 2500 MCG CAPS- Hair Skin Nails Cholecalciferol (VITAMIN D3) 2000 units capsule dapagliflozin propanediol (FARXIGA) 10 MG TABS tablet famotidine (PEPCID) 40 MG tablet levothyroxine (SYNTHROID) 112 MCG tablet melatonin 5 MG TABS Patient reported strength as 10 mg mesalamine (LIALDA) 1.2 g EC tablet metFORMIN (GLUCOPHAGE) 1000 MG tablet Misc Natural Products (NEURIVA PO) Misc Natural Products (TURMERIC CURCUMIN) CAPS Multiple Vitamin (MULTIVITAMIN) tablet ramipril (ALTACE) 10 MG capsule  Have you seen any other providers since your last visit?  Patient stated she has not seen any other providers  Any changes in your medications or health?  Patient stated she she has not had any changes  Any side effects from any medications?  Patient stated she has noticed an increase in her joint pain after starting anastrazole since having breast cancer last year  Do you have an symptoms or problems not managed by your medications?  Patient stated she has difficulty sleeping most nights. She uses 10 mg of melatonin regularly, but stated when that doesn't work she takes a benadryl. She stated that sometimes she takes benadryl 4-5 nights in a row to help her go to sleep.  Any concerns about your health right now? Patient stated she has no concerns for her health  Has your provider asked that you check blood pressure, blood sugar, or follow special diet at home?  Patient stated she uses her arm cuff to check her BP at home whenever she feels the need to but hasn't in a while. She also checks her FBS regularly. She has no specific diet besides watching carbs however she has not been doing so lately.  Do you get any type of  exercise on a regular basis?  Patient stated she runs errands, mows the lawn with both push and riding lawn mowers, and does house work  Can you think of a goal you would like to reach for your health? Patient stated she would like to lose weight but does not have a specific goal  Do you have any problems getting your medications?  Patient stated she has no problems getting her medications through mail delivery   Is there anything that you would like to discuss during the appointment?  Patient stated she would like to discuss adding/continuing Total Restore supplement with concerns of interactions. She would also like to discuss options for sleep management  Please bring medications and supplements to appointment Reminded patient of initial office visit with CPP on 06/27 at 11 am  [employer], CMA

## 2020-07-15 ENCOUNTER — Telehealth: Payer: Self-pay | Admitting: Hematology

## 2020-07-15 ENCOUNTER — Ambulatory Visit: Payer: Medicare Other | Admitting: Family Medicine

## 2020-07-15 ENCOUNTER — Encounter: Payer: Self-pay | Admitting: Family Medicine

## 2020-07-15 ENCOUNTER — Other Ambulatory Visit: Payer: Self-pay

## 2020-07-15 ENCOUNTER — Ambulatory Visit (INDEPENDENT_AMBULATORY_CARE_PROVIDER_SITE_OTHER): Payer: Medicare Other | Admitting: Family Medicine

## 2020-07-15 VITALS — BP 122/75 | HR 72 | Temp 97.6°F | Ht 60.0 in | Wt 199.6 lb

## 2020-07-15 DIAGNOSIS — E1159 Type 2 diabetes mellitus with other circulatory complications: Secondary | ICD-10-CM

## 2020-07-15 DIAGNOSIS — C50411 Malignant neoplasm of upper-outer quadrant of right female breast: Secondary | ICD-10-CM

## 2020-07-15 DIAGNOSIS — G4733 Obstructive sleep apnea (adult) (pediatric): Secondary | ICD-10-CM

## 2020-07-15 DIAGNOSIS — E1169 Type 2 diabetes mellitus with other specified complication: Secondary | ICD-10-CM | POA: Diagnosis not present

## 2020-07-15 DIAGNOSIS — K5190 Ulcerative colitis, unspecified, without complications: Secondary | ICD-10-CM

## 2020-07-15 DIAGNOSIS — E782 Mixed hyperlipidemia: Secondary | ICD-10-CM

## 2020-07-15 DIAGNOSIS — E119 Type 2 diabetes mellitus without complications: Secondary | ICD-10-CM

## 2020-07-15 DIAGNOSIS — Z9989 Dependence on other enabling machines and devices: Secondary | ICD-10-CM

## 2020-07-15 DIAGNOSIS — Z170 Estrogen receptor positive status [ER+]: Secondary | ICD-10-CM

## 2020-07-15 DIAGNOSIS — Z6835 Body mass index (BMI) 35.0-35.9, adult: Secondary | ICD-10-CM

## 2020-07-15 DIAGNOSIS — I152 Hypertension secondary to endocrine disorders: Secondary | ICD-10-CM

## 2020-07-15 DIAGNOSIS — F430 Acute stress reaction: Secondary | ICD-10-CM

## 2020-07-15 LAB — POCT GLYCOSYLATED HEMOGLOBIN (HGB A1C): Hemoglobin A1C: 6.9 % — AB (ref 4.0–5.6)

## 2020-07-15 MED ORDER — RAMIPRIL 10 MG PO CAPS
10.0000 mg | ORAL_CAPSULE | Freq: Every day | ORAL | 3 refills | Status: DC
Start: 2020-07-15 — End: 2021-08-09

## 2020-07-15 MED ORDER — ATORVASTATIN CALCIUM 10 MG PO TABS: 10.0000 mg | ORAL_TABLET | Freq: Every evening | ORAL | 3 refills | Status: DC

## 2020-07-15 MED ORDER — METFORMIN HCL 1000 MG PO TABS
1000.0000 mg | ORAL_TABLET | Freq: Two times a day (BID) | ORAL | 3 refills | Status: DC
Start: 2020-07-15 — End: 2021-08-09

## 2020-07-15 NOTE — Progress Notes (Signed)
Subjective  CC:  Chief Complaint  Patient presents with   Hypertension   Diabetes   Hyperlipidemia   Hypothyroidism    HPI: Michelle Sherman is a 70 y.o. female who presents to the office today for follow up of diabetes and problems listed above in the chief complaint.  Diabetes follow up: Her overall diabetic control is reported as Unchanged. However, her most recent sugars are higher than normal. Eating worse and very stressed. See below. No sxs of hyperglycemia. Doing well with her meds: met,farxiga. She denies exertional CP or SOB or symptomatic hypoglycemia. She denies foot sores or paresthesias.  Blood pressure, weight, HLD are all stable. On Ace. No cp. No sxs of chf. Tolerating statin and last ldl in the 85s.  Cpap nightly for osa. No concerns. UC on meds and stable reviewed gi notes Breast cancer s/p treatment and remains on arimidex. Reviewed onc notes. C/o stress reaction last weekend. 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 Feels like she has to do "everything" and is overwhelmed. Doesn't have help, respite care in place; hasn't had a vacation in a long while. Also admits she likes to control things and doesn't make herself a priority. Denies depression. Has seen a therapist in the past. Doesn't like to ask for help. No panic sxs. No h/o mood disorder. Has started ashwaganda for nerves.   Wt Readings from Last 3 Encounters:  07/15/20 199 lb 9.6 oz (90.5 kg)  05/06/20 201 lb 11.2 oz (91.5 kg)  04/05/20 200 lb 3.2 oz (90.8 kg)    BP Readings from Last 3 Encounters:  07/15/20 122/75  05/06/20 124/62  04/05/20 118/78    Assessment  1. Controlled type 2 diabetes mellitus without complication, without long-term current use of insulin ([place])   2. Hypertension associated with diabetes ([place])   3. Severe obesity (BMI 35.0-35.9 with comorbidity) ([place])   4. Combined hyperlipidemia associated with type 2 diabetes  mellitus ([place])   5. Ulcerative colitis without complications, unspecified location ([place])   6. OSA on CPAP   7. Malignant neoplasm of upper-outer quadrant of right breast in female, estrogen receptor positive ([place])   8. Stress reaction      Plan  Diabetes is currently well controlled.  Will recheck in 3 months to ensure it is not trending upward.  Continue metformin 1000 twice daily and Farxiga 10 mg daily.  Other diabetic care measures are up-to-date. Hypertension is well controlled on ramipril 10 daily. Hyperlipidemia is well controlled on statin nightly.,  Rosuvastatin Chronic medical problems are well controlled including sleep apnea, ulcerative colitis and maintenance therapy for breast cancer.  No changes made today. Stress reaction: Counseling done.  Had a long discussion regarding realistic expectations of her responsibilities, self-care and discussion of barriers to getting help when needed.  Helped problem solve.  Patient is not currently depressed.  She will continue aswaganda and it anxiety is not improving, can consider medication for mood.  We will reassess in 3 months   Today's visit was 52 minutes long. Greater than 50% of this time was devoted to face to face counseling with the patient and coordination of care. We discussed her diagnosis, prognosis, treatment options and treatment plan is documented above.   Follow up: No follow-ups on file.. Orders Placed This Encounter  Procedures   POCT HgB A1C   Meds ordered this encounter  Medications   atorvastatin (LIPITOR) 10 MG tablet  Sig: Take 1 tablet (10 mg total) by mouth every evening.    Dispense:  90 tablet    Refill:  3   metFORMIN (GLUCOPHAGE) 1000 MG tablet    Sig: Take 1 tablet (1,000 mg total) by mouth 2 (two) times daily with a meal.    Dispense:  180 tablet    Refill:  3   ramipril (ALTACE) 10 MG capsule    Sig: Take 1 capsule (10 mg total) by mouth daily.    Dispense:  90 capsule    Refill:  3       Immunization History  Administered Date(s) Administered   Fluad Quad(high Dose 65+) 12/15/2019   Influenza, High Dose Seasonal PF 12/06/2015, 11/01/2016, 09/21/2017, 10/26/2018   Moderna SARS-COV2 Booster Vaccination 04/15/2020   Moderna Sars-Covid-2 Vaccination 04/24/2019, 05/21/2019   Pneumococcal Conjugate-13 10/22/2014   Pneumococcal Polysaccharide-23 10/05/2011, 12/21/2016   Tdap 10/04/2010   Zoster Recombinat (Shingrix) 01/02/2019, 03/25/2019   Zoster, Live 10/03/2009    Diabetes Related Lab Review: Lab Results  Component Value Date   HGBA1C 6.9 (A) 07/15/2020   HGBA1C 6.4 (A) 01/07/2020   HGBA1C 6.4 (A) 07/10/2019    Lab Results  Component Value Date   MICROALBUR <0.7 01/01/2019   Lab Results  Component Value Date   CREATININE 0.55 (L) 01/07/2020   BUN 15 01/07/2020   NA 140 01/07/2020   K 4.6 01/07/2020   CL 102 01/07/2020   CO2 30 01/07/2020   Lab Results  Component Value Date   CHOL 152 01/07/2020   CHOL 153 01/01/2019   CHOL 146 12/24/2017   Lab Results  Component Value Date   HDL 67 01/07/2020   HDL 64.60 01/01/2019   HDL 75.80 12/24/2017   Lab Results  Component Value Date   LDLCALC 62 01/07/2020   LDLCALC 55 01/01/2019   LDLCALC 53 12/24/2017   Lab Results  Component Value Date   TRIG 143 01/07/2020   TRIG 169.0 (H) 01/01/2019   TRIG 88.0 12/24/2017   Lab Results  Component Value Date   CHOLHDL 2.3 01/07/2020   CHOLHDL 2 01/01/2019   CHOLHDL 2 12/24/2017   No results found for: LDLDIRECT The 10-year ASCVD risk score Mikey Bussing DC Jr., et al., 2013) is: 18.9%   Values used to calculate the score:     Age: 14 years     Sex: Female     Is Non-Hispanic African American: No     Diabetic: Yes     Tobacco smoker: No     Systolic Blood Pressure: 176 mmHg     Is BP treated: Yes     HDL Cholesterol: 67 mg/dL     Total Cholesterol: 152 mg/dL I have reviewed the PMH, Fam and Soc history. Patient Active Problem List   Diagnosis Date Noted    Controlled type 2 diabetes mellitus without complication, without long-term current use of insulin ([place]) 11/01/2016    Priority: High    Overview:  Declined nutrition consult - due to lack of insurance coverage for this service 01/2013     Hypertension associated with diabetes ([place]) 11/01/2016    Priority: High   Severe obesity (BMI 35.0-35.9 with comorbidity) ([place]) 11/01/2016    Priority: High   Combined hyperlipidemia associated with type 2 diabetes mellitus ([place]) 01/15/2012    Priority: High    Overview:  Goal LDL < 100     Ulcerative colitis without complications ([place]) 16/07/3708    Priority: High    Overview:  No  NSAIDS     Acquired hypothyroidism 10/04/2010    Priority: High   OSA on CPAP 06/28/2010    Priority: High    NPSG 06/04/06- AHI ? 23/ hr, desaturation to 66%, CPAP titrated to 8, body weight 242 lbs. HST 02/05/17-AHI 23.4/hour, desaturation to 86%, body weight 192 pounds    Diverticulosis of colon 10/17/2010    Priority: Medium   Osteoarthrosis of knee 10/04/2010    Priority: Medium    Discussed synvisc 12/2017; has had PT. 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 The report date is 10/05/2019.  The Breast Cancer STAT Panel offered by [facility] includes sequencing and deletion/duplication analysis for the following 9 genes:  ATM, BRCA1, BRCA2, CDH1, CHEK2, PALB2, PTEN, STK11 and TP53. The Common Hereditary Cancers Panel offered by [facility] includes sequencing and/or deletion duplication testing of the following 48 genes: APC, ATM, AXIN2, BARD1, BMPR1A, BRCA1,  BRCA2, BRIP1, CDH1, CDK4, CDKN2A (p14ARF), CDKN2A (p16INK4a), CHEK2, CTNNA1, DICER1, EPCAM (Deletion/duplication testing only), GREM1 (promoter region deletion/duplication testing only), KIT, MEN1, MLH1, MSH2, MSH3, MSH6, MUTYH, NBN, NF1, NTHL1, PALB2, PDGFRA, PMS2, POLD1, POLE, PTEN, RAD50, RAD51C, RAD51D, RNF43, SDHB, SDHC, SDHD, SMAD4, SMARCA4. STK11, TP53, TSC1, TSC2, and VHL.  The following genes were evaluated for sequence changes only: SDHA and HOXB13 c.251G>A variant only.     Family history of pancreatic cancer    Family history of lung cancer    Malignant neoplasm of upper-outer quadrant of right breast in female, estrogen receptor positive ([place]) 09/18/2019   Incomplete uterovaginal prolapse 04/03/2019   Plantar fasciitis 05/06/2017    Social History: Patient  reports that she has never smoked. She has never used smokeless tobacco. She reports current alcohol use. She reports that she does not use drugs.  Review of Systems: Ophthalmic: negative for eye pain, loss of vision or double vision Cardiovascular: negative for chest pain Respiratory: negative for SOB or persistent cough Gastrointestinal: negative for abdominal pain Genitourinary: negative for dysuria or gross hematuria MSK: negative for foot lesions Neurologic: negative for weakness or gait disturbance  Objective  Vitals: BP 122/75   Pulse 72   Temp 97.6 F (36.4 C) (Temporal)   Ht 5' (1.524 m)   Wt 199 lb 9.6 oz (90.5 kg)   SpO2 96%   BMI 38.98 kg/m  General: well appearing, no acute distress  Psych:  Alert and oriented, normal mood and affect Cardiovascular:  Nl S1 and S2, RRR without murmur, gallop or rub. no edema Foot exam: no erythema, pallor, or cyanosis visible nl proprioception and sensation to monofilament testing bilaterally, +2 distal pulses bilaterally    Diabetic education: ongoing education regarding chronic disease management for diabetes was given today. We continue to reinforce the ABC's of  diabetic management: A1c (<7 or 8 dependent upon patient), tight blood pressure control, and cholesterol management with goal LDL < 100 minimally. We discuss diet strategies, exercise recommendations, medication options and possible side effects. At each visit, we review recommended immunizations and preventive care recommendations for diabetics and stress that good diabetic control can prevent other problems.  See below for this patient's data.   Commons side effects, risks, benefits, and alternatives for medications and treatment plan prescribed today were discussed, and the patient expressed understanding of the given instructions. Patient is instructed to call or message via MyChart if he/she has any questions or concerns regarding our treatment plan. No barriers to understanding were identified. We discussed Red Flag symptoms and signs in detail. Patient expressed understanding regarding what to do in case of urgent or emergency type symptoms.  Medication list was reconciled, printed and provided to the patient in AVS. Patient instructions and summary information was reviewed with the patient as documented in the AVS. This note was prepared with assistance of Dragon voice recognition software. Occasional wrong-word or sound-a-like substitutions may have occurred due to the inherent limitations of voice recognition software  This visit occurred during the SARS-CoV-2 public health emergency.  Safety protocols were in place, including screening questions prior to the visit, additional usage of staff PPE, and extensive cleaning of exam room while observing appropriate contact time as indicated for disinfecting solutions.

## 2020-07-15 NOTE — Patient Instructions (Signed)
Please return in 3 months for diabetes follow up and stress.  If you have any questions or concerns, please don't hesitate to send me a message via MyChart or call the office at [phone]. Thank you for visiting with [place] today! It's our pleasure caring for you.

## 2020-07-15 NOTE — Telephone Encounter (Signed)
Left message with rescheduled upcoming appointment due to provider's PAL.

## 2020-07-19 ENCOUNTER — Ambulatory Visit (INDEPENDENT_AMBULATORY_CARE_PROVIDER_SITE_OTHER): Payer: Medicare Other

## 2020-07-19 ENCOUNTER — Ambulatory Visit: Payer: Medicare Other | Admitting: Internal Medicine

## 2020-07-19 ENCOUNTER — Other Ambulatory Visit: Payer: Self-pay

## 2020-07-19 DIAGNOSIS — E1159 Type 2 diabetes mellitus with other circulatory complications: Secondary | ICD-10-CM | POA: Diagnosis not present

## 2020-07-19 DIAGNOSIS — I152 Hypertension secondary to endocrine disorders: Secondary | ICD-10-CM

## 2020-07-19 DIAGNOSIS — E782 Mixed hyperlipidemia: Secondary | ICD-10-CM | POA: Diagnosis not present

## 2020-07-19 DIAGNOSIS — E1169 Type 2 diabetes mellitus with other specified complication: Secondary | ICD-10-CM | POA: Diagnosis not present

## 2020-07-19 DIAGNOSIS — E119 Type 2 diabetes mellitus without complications: Secondary | ICD-10-CM | POA: Diagnosis not present

## 2020-07-19 NOTE — Patient Instructions (Signed)
[name],  Thank you for talking with me today. I have included our care plan/goals in the following pages.   Please review and call me at [phone] with any questions.  Thanks! [name], Pharm.D., BCGP Clinical Pharmacist [facility] [place] [phone] Patient Care Plan: [place] Plan     Problem Identified: HTN, OSA on CPAP, HLD, DMII, OA, Morbid obesity, Hypothyroidism, UC   Priority: High     Long-Range Goal: Patient-Specific Goal   Start Date: 07/19/2020  Expected End Date: 07/19/2021  This Visit's Progress: On track  Priority: High  Note:   Current Barriers:  Focus on dietary and exercise changes  Pharmacist Clinical Goal(s):  Patient will contact provider office for questions/concerns as evidenced notation of same in electronic health record through collaboration with PharmD and provider.   Interventions: 1:1 collaboration with Leamon Arnt, MD regarding development and update of comprehensive plan of care as evidenced by provider attestation and co-signature Inter-disciplinary care team collaboration (see longitudinal plan of care) Comprehensive medication review performed; medication list updated in electronic medical record  Hypertension (BP goal <140/90) -Controlled -Current treatment: Ramipril 10 mg once daily -Medications previously tried: n/a  -Denies hypotensive/hypertensive symptoms -Educated on BP goals and benefits of medications for prevention of heart attack, stroke and kidney damage; -Counseled to monitor BP at home once every 1-2 weeks, document, and provide log at future appointments -Recommended to continue current medication  Hyperlipidemia: (LDL goal < 100) -Controlled -Current treatment: Atorvastatin 10 mg once daily -Medications previously tried: none  -Reviewed for side effects - no problems noted  -Educated on Cholesterol goals;  -Recommended to continue current  medication  Diabetes (A1c goal <7%) -Controlled - consistently within 6-7% range  -Some ongoing stress -Current medications: Metformin 1000 mg twice daily Farxiga 10 mg once daily  -Medications previously tried: Bydureon (n/v) -Current home glucose readings fasting glucose: 160s-170s recently, 140s today -Denies hypoglycemic/hyperglycemic symptoms -Current meal patterns: salad, lots of takeout  -Current exercise: minimal, is trying to get back to fishing -Educated on A1c and blood sugar goals; -Counseled to check feet daily and get yearly eye exams -Recommended to continue current medication  Patient Goals/Self-Care Activities Patient will:  - take medications as prescribed target a minimum of 150 minutes of moderate intensity exercise weekly     The patient was given the following information about Chronic Care Management services today, agreed to services, and gave verbal consent: 1. CCM service includes personalized support from designated clinical staff supervised by the primary care provider, including individualized plan of care and coordination with other care providers 2. 24/7 contact phone numbers for assistance for urgent and routine care needs. 3. Service will only be billed when office clinical staff spend 20 minutes or more in a month to coordinate care. 4. Only one practitioner may furnish and bill the service in a calendar month. 5.The patient may stop CCM services at any time (effective at the end of the month) by phone call to the office staff. 6. The patient will be responsible for cost sharing (co-pay) of up to 20% of the service fee (after annual deductible is met). Patient agreed to services and consent obtained.  The patient verbalized understanding of instructions provided today and agreed to receive a MyChart copy of patient instruction and/or educational materials. Telephone follow up appointment with pharmacy team member scheduled for: See next appointment with  "Care Management Staff" under "What's Next" below. High  Cholesterol  High cholesterol is a condition in which the blood has high levels of a white, waxy substance similar to fat (cholesterol). The liver makes all the cholesterol that the body needs. The human body needs small amounts of cholesterol to help build cells. A person gets extra orexcess cholesterol from the food that he or she eats. The blood carries cholesterol from the liver to the rest of the body. If you have high cholesterol, deposits (plaques) may build up on the walls of your arteries. Arteries are the blood vessels that carry blood away from your heart. These plaques make the arteries narrowand stiff. Cholesterol plaques increase your risk for heart attack and stroke. Work withyour health care provider to keep your cholesterol levels in a healthy range. What increases the risk? The following factors may make you more likely to develop this condition: Eating foods that are high in animal fat (saturated fat) or cholesterol. Being overweight. Not getting enough exercise. A family history of high cholesterol (familial hypercholesterolemia). Use of tobacco products. Having diabetes. What are the signs or symptoms? There are no symptoms of this condition. How is this diagnosed? This condition may be diagnosed based on the results of a blood test. If you are older than 71 years of age, your health care provider may check your cholesterol levels every 4-6 years. You may be checked more often if you have high cholesterol or other risk factors for heart disease. The blood test for cholesterol measures: "Bad" cholesterol, or LDL cholesterol. This is the main type of cholesterol that causes heart disease. The desired level is less than 100 mg/dL. "Good" cholesterol, or HDL cholesterol. HDL helps protect against heart disease by cleaning the arteries and carrying the LDL to the liver for processing. The desired level for HDL is 60 mg/dL or  higher. Triglycerides. These are fats that your body can store or burn for energy. The desired level is less than 150 mg/dL. Total cholesterol. This measures the total amount of cholesterol in your blood and includes LDL, HDL, and triglycerides. The desired level is less than 200 mg/dL. How is this treated? This condition may be treated with: Diet changes. You may be asked to eat foods that have more fiber and less saturated fats or added sugar. Lifestyle changes. These may include regular exercise, maintaining a healthy weight, and quitting use of tobacco products. Medicines. These are given when diet and lifestyle changes have not worked. You may be prescribed a statin medicine to help lower your cholesterol levels. Follow these instructions at home: Eating and drinking  Eat a healthy, balanced diet. This diet includes: Daily servings of a variety of fresh, frozen, or canned fruits and vegetables. Daily servings of whole grain foods that are rich in fiber. Foods that are low in saturated fats and trans fats. These include poultry and fish without skin, lean cuts of meat, and low-fat dairy products. A variety of fish, especially oily fish that contain omega-3 fatty acids. Aim to eat fish at least 2 times a week. Avoid foods and drinks that have added sugar. Use healthy cooking methods, such as roasting, grilling, broiling, baking, poaching, steaming, and stir-frying. Do not fry your food except for stir-frying.  Lifestyle  Get regular exercise. Aim to exercise for a total of 150 minutes a week. Increase your activity level by doing activities such as gardening, walking, and taking the stairs. Do not use any products that contain nicotine or tobacco, such as cigarettes, e-cigarettes, and chewing tobacco. If  you need help quitting, ask your health care provider.  General instructions Take over-the-counter and prescription medicines only as told by your health care provider. Keep all  follow-up visits as told by your health care provider. This is important. Where to find more information American Heart Association: www.heart.org National Heart, Lung, and Blood Institute: https://wilson-eaton.com/ Contact a health care provider if: You have trouble achieving or maintaining a healthy diet or weight. You are starting an exercise program. You are unable to stop smoking. Get help right away if: You have chest pain. You have trouble breathing. You have any symptoms of a stroke. "BE FAST" is an easy way to remember the main warning signs of a stroke: B - Balance. Signs are dizziness, sudden trouble walking, or loss of balance. E - Eyes. Signs are trouble seeing or a sudden change in vision. F - Face. Signs are sudden weakness or numbness of the face, or the face or eyelid drooping on one side. A - Arms. Signs are weakness or numbness in an arm. This happens suddenly and usually on one side of the body. S - Speech. Signs are sudden trouble speaking, slurred speech, or trouble understanding what people say. T - Time. Time to call emergency services. Write down what time symptoms started. You have other signs of a stroke, such as: A sudden, severe headache with no known cause. Nausea or vomiting. Seizure. These symptoms may represent a serious problem that is an emergency. Do not wait to see if the symptoms will go away. Get medical help right away. Call your local emergency services (911 in the [place] Do not drive yourself to the hospital. Summary Cholesterol plaques increase your risk for heart attack and stroke. Work with your health care provider to keep your cholesterol levels in a healthy range. Eat a healthy, balanced diet, get regular exercise, and maintain a healthy weight. Do not use any products that contain nicotine or tobacco, such as cigarettes, e-cigarettes, and chewing tobacco. Get help right away if you have any symptoms of a stroke. This information is not intended to  replace advice given to you by your health care provider. Make sure you discuss any questions you have with your healthcare provider. Document Revised: 12/09/2018 Document Reviewed: 12/09/2018 Elsevier Patient Education  2022 [employer].

## 2020-07-19 NOTE — Progress Notes (Signed)
Chronic Care Management Pharmacy Note  07/19/2020 Name:  Michelle Sherman MRN:  [id number] DOB:  Feb 19, 1950  Recommendations/Changes made from today's visit: No Rx changes  Subjective: Michelle Sherman is an 70 y.o. year old female who is a primary patient of Leamon Arnt, MD.  The CCM team was consulted for assistance with disease management and care coordination needs.    Engaged with patient face to face for initial visit in response to provider referral for pharmacy case management and/or care coordination services.   Consent to Services:  The patient was given the following information about Chronic Care Management services today, agreed to services, and gave verbal consent: 1. CCM service includes personalized support from designated clinical staff supervised by the primary care provider, including individualized plan of care and coordination with other care providers 2. 24/7 contact phone numbers for assistance for urgent and routine care needs. 3. Service will only be billed when office clinical staff spend 20 minutes or more in a month to coordinate care. 4. Only one practitioner may furnish and bill the service in a calendar month. 5.The patient may stop CCM services at any time (effective at the end of the month) by phone call to the office staff. 6. The patient will be responsible for cost sharing (co-pay) of up to 20% of the service fee (after annual deductible is met). Patient agreed to services and consent obtained.  Patient Care Team: Leamon Arnt, MD as PCP - General (Family Medicine) Deneise Lever, MD as Consulting Physician (Pulmonary Disease) Juanita Craver, MD as Consulting Physician (Gastroenterology) Lyndee Hensen, PT as Physical Therapist (Physical Therapy) Trula Slade, DPM as Consulting Physician (Podiatry) Katy Apo, MD as Consulting Physician (Ophthalmology) Rockwell Germany, RN as Oncology Nurse Navigator Mauro Kaufmann, RN as Oncology Nurse  Navigator [name], MD as Consulting Physician (General Surgery) Truitt Merle, MD as Consulting Physician (Hematology) Kyung Rudd, MD as Consulting Physician (Radiation Oncology) Alla Feeling, NP as Nurse Practitioner (Nurse Practitioner) Madelin Rear, [facility] as Pharmacist (Pharmacist)  Objective:  Lab Results  Component Value Date   CREATININE 0.55 (L) 01/07/2020   CREATININE 0.68 11/07/2019   CREATININE 0.65 10/10/2019    Lab Results  Component Value Date   HGBA1C 6.9 (A) 07/15/2020   Last diabetic Eye exam:  Lab Results  Component Value Date/Time   HMDIABEYEEXA No Retinopathy 11/28/2018 12:00 AM    Last diabetic Foot exam: No results found for: HMDIABFOOTEX      Component Value Date/Time   CHOL 152 01/07/2020 0907   TRIG 143 01/07/2020 0907   HDL 67 01/07/2020 0907   CHOLHDL 2.3 01/07/2020 0907   VLDL 33.8 01/01/2019 1001   LDLCALC 62 01/07/2020 0907    Hepatic Function Latest Ref Rng & Units 01/07/2020 09/24/2019 01/01/2019  Total Protein 6.1 - 8.1 g/dL 6.6 7.5 6.9  Albumin 3.5 - 5.0 g/dL - 4.0 4.2  AST 10 - 35 U/L 12 13(L) 13  ALT 6 - 29 U/L _0 Alk Phosphatase 38 - 126 U/L - 81 81  Total Bilirubin 0.2 - 1.2 mg/dL 0.6 0.5 0.5    Lab Results  Component Value Date/Time   TSH 0.92 01/07/2020 09:07 AM   TSH 1.73 01/01/2019 10:01 AM    CBC Latest Ref Rng & Units 01/07/2020 09/24/2019 01/01/2019  WBC 3.8 - 10.8 Thousand/uL 5.8 7.5 6.7  Hemoglobin 11.7 - 15.5 g/dL 14.8 15.0 14.6  Hematocrit 35.0 - 45.0 % 44.2  47.3(H) 44.3  Platelets 140 - 400 Thousand/uL 329 323 289.0    Lab Results  Component Value Date/Time   VD25OH 56.65 01/01/2019 10:01 AM   VD25OH 51.59 12/21/2016 10:58 AM    Clinical ASCVD:  The 10-year ASCVD risk score Mikey Bussing DC Jr., et al., 2013) is: 18.9%   Values used to calculate the score:     Age: 69 years     Sex: Female     Is Non-Hispanic African American: No     Diabetic: Yes     Tobacco smoker: No     Systolic Blood Pressure:  122 mmHg     Is BP treated: Yes     HDL Cholesterol: 67 mg/dL     Total Cholesterol: 152 mg/dL    DEXA UTD - normal BMD 06/2020  Social History   Tobacco Use  Smoking Status Never  Smokeless Tobacco Never   BP Readings from Last 3 Encounters:  07/15/20 122/75  05/06/20 124/62  04/05/20 118/78   Pulse Readings from Last 3 Encounters:  07/15/20 72  05/06/20 73  04/05/20 81   Wt Readings from Last 3 Encounters:  07/15/20 199 lb 9.6 oz (90.5 kg)  05/06/20 201 lb 11.2 oz (91.5 kg)  04/05/20 200 lb 3.2 oz (90.8 kg)    Assessment: Review of patient past medical history, allergies, medications, health status, including review of consultants reports, laboratory and other test data, was performed as part of comprehensive evaluation and provision of chronic care management services.   SDOH:  (Social Determinants of Health) assessments and interventions performed:    CCM Care Plan  Allergies  Allergen Reactions   Nickel Other (See Comments) and Rash    drainage   Sulfa Antibiotics Rash   Bydureon [Exenatide] Nausea And Vomiting    Medications Reviewed Today     Reviewed by Madelin Rear, [facility] (Pharmacist) on 07/19/20 at 1244  Med List Status: <None>   Medication Order Taking? Sig Documenting Provider Last Dose Status Informant  anastrozole (ARIMIDEX) 1 MG tablet [id number] Yes Take 1 tablet (1 mg total) by mouth daily. Truitt Merle, MD  Active   ASHWAGANDHA PO [id number] Yes Take by mouth. [provider]  Active   aspirin EC 81 MG tablet [id number] Yes Take 1 tablet by mouth daily. [provider]  Active   atorvastatin (LIPITOR) 10 MG tablet [id number] Yes Take 1 tablet (10 mg total) by mouth every evening. Leamon Arnt, MD  Active   Biotin 2500 MCG CAPS [id number] Yes Take 1 capsule by mouth daily. [provider]  Active   Cholecalciferol (VITAMIN D3) 2000 units capsule [id number] Yes Take 1 capsule by mouth daily. [provider]  Active    dapagliflozin propanediol (FARXIGA) 10 MG TABS tablet [id number]  Take 1 tablet (10 mg total) by mouth daily. Leamon Arnt, MD  Active   famotidine (PEPCID) 40 MG tablet [id number] Yes Take 40 mg by mouth 2 (two) times daily. [provider]  Active   levothyroxine (SYNTHROID) 112 MCG tablet [id number] Yes Take 1 tablet (112 mcg total) by mouth daily. Leamon Arnt, MD  Active   melatonin 5 MG TABS [id number] Yes Take 5 mg by mouth at bedtime as needed. [provider]  Active   mesalamine (LIALDA) 1.2 g EC tablet [id number] Yes Take by mouth. Take 2 tablet in am and 2 tablet in pm [provider]  Active   metFORMIN (GLUCOPHAGE) 1000 MG tablet [id number] Yes Take 1 tablet (1,000  mg total) by mouth 2 (two) times daily with a meal. Leamon Arnt, MD  Active   Misc Natural Products (TURMERIC CURCUMIN) CAPS [id number] Yes Take 1 capsule by mouth daily. [provider]  Active   Multiple Vitamin (MULTIVITAMIN) tablet [id number] Yes Take 1 tablet by mouth daily. [provider]  Active   ramipril (ALTACE) 10 MG capsule [id number] Yes Take 1 capsule (10 mg total) by mouth daily. 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   Plantar fasciitis 05/06/2017   Controlled type 2 diabetes mellitus without complication, without long-term current use of insulin ([place]) 11/01/2016   Hypertension associated with diabetes ([place]) 11/01/2016   Severe obesity (BMI 35.0-35.9 with comorbidity) ([place]) 11/01/2016   Combined hyperlipidemia associated with type 2 diabetes mellitus ([place]) 01/15/2012   Allergic rhinitis 10/05/2011    Ulcerative colitis without complications ([place]) 26/33/3545   Diverticulosis of colon 10/17/2010   Internal hemorrhoids 10/17/2010   Acquired hypothyroidism 10/04/2010   Osteoarthrosis of knee 10/04/2010   OSA on CPAP 06/28/2010    Immunization History  Administered Date(s) Administered   Fluad Quad(high Dose 65+) 12/15/2019   Influenza, High Dose Seasonal PF 12/06/2015, 11/01/2016, 09/21/2017, 10/26/2018   Moderna SARS-COV2 Booster Vaccination 04/15/2020   Moderna Sars-Covid-2 Vaccination 04/24/2019, 05/21/2019   Pneumococcal Conjugate-13 10/22/2014   Pneumococcal Polysaccharide-23 10/05/2011, 12/21/2016   Tdap 10/04/2010   Zoster Recombinat (Shingrix) 01/02/2019, 03/25/2019   Zoster, Live 10/03/2009    Conditions to be addressed/monitored: HTN, OSA on CPAP, HLD, DMII, OA, Obesity, Hypothyroidism, UC  Care Plan : [place]  Updates made by Madelin Rear, [facility] since 07/19/2020 12:00 AM     Problem: HTN, OSA on CPAP, HLD, DMII, OA, Obesity, Hypothyroidism, UC   Priority: High     Long-Range Goal: Patient-Specific Goal   Start Date: 07/19/2020  Expected End Date: 07/19/2021  This Visit's Progress: On track  Priority: High  Note:   Current Barriers:  Focus on dietary and exercise changes  Pharmacist Clinical Goal(s):  Patient will contact provider office for questions/concerns as evidenced notation of same in electronic health record through collaboration with PharmD and provider.   Interventions: 1:1 collaboration with Leamon Arnt, MD regarding development and update of comprehensive plan of care as evidenced by provider attestation and co-signature Inter-disciplinary care team collaboration (see longitudinal plan of care) Comprehensive medication review performed; medication list updated in electronic medical record  Hypertension (BP goal <140/90) -Controlled -Current treatment: Ramipril 10 mg once daily -Medications previously tried: n/a  -Denies  hypotensive/hypertensive symptoms -Educated on BP goals and benefits of medications for prevention of heart attack, stroke and kidney damage; -Counseled to monitor BP at home once every 1-2 weeks, document, and provide log at future appointments -Recommended to continue current medication  Hyperlipidemia: (LDL goal < 100) -Controlled -Current treatment: Atorvastatin 10 mg once daily -Medications previously tried: none  -Reviewed for side effects - no problems noted  -Educated on Cholesterol goals;  -Recommended to continue current medication  Diabetes (A1c goal <7%) -Controlled - consistently within 6-7% range  -Some ongoing stress -Current medications: Metformin 1000 mg  twice daily Farxiga 10 mg once daily  -Medications previously tried: Bydureon (n/v) -Current home glucose readings fasting glucose: 160s-170s recently, 140s today -Denies hypoglycemic/hyperglycemic symptoms -Current meal patterns: salad, lots of takeout  -Current exercise: minimal, is trying to get back to fishing -Educated on A1c and blood sugar goals; -Counseled to check feet daily and get yearly eye exams -Recommended to continue current medication  Patient Goals/Self-Care Activities Patient will:  - take medications as prescribed target a minimum of 150 minutes of moderate intensity exercise weekly    Medication Assistance: None required.  Patient affirms current coverage meets needs. Patient's preferred pharmacy is:  [facility] [place], [place] - [address] [place] [address] Phone: [phone] Fax: [phone]  Follow Up:  Patient agrees to Care Plan and Follow-up. Plan:  CPA f/u call 4-6 weeks to check on dietary changes (eating out less, was thinking about starting meal service to help cook at home), was also hoping to start yoga through silver sneakers benefit. Was also going to consolidate supplements given that between all the gummies she was at 25%  daily value for added sugar. CPP f/u telephone visit 6 months .  Future Appointments  Date Time Provider [place]  08/02/2020  3:50 PM Collie Siad A, PTA [facility] None  08/06/2020  1:30 PM [facility] None  08/06/2020  2:00 PM Truitt Merle, MD [facility] None  08/16/2020  2:30 PM Deneise Lever, MD [facility] None  10/19/2020 11:00 AM Leamon Arnt, MD [facility]  01/07/2021  9:00 AM Leamon Arnt, MD [facility]  01/25/2021  1:00 PM [facility] CCM PHARMACIST [facility]  04/11/2021  1:00 PM [facility] [place], PharmD, CPP Clinical Pharmacist Practitioner  [facility]  [phone]

## 2020-07-21 ENCOUNTER — Telehealth: Payer: Self-pay

## 2020-07-21 NOTE — Chronic Care Management (AMB) (Signed)
    Chronic Care Management Pharmacy Assistant   Name: Michelle Sherman  MRN: [id number] DOB: 01/25/1950   Medications: Outpatient Encounter Medications as of 07/21/2020  Medication Sig   anastrozole (ARIMIDEX) 1 MG tablet Take 1 tablet (1 mg total) by mouth daily.   ASHWAGANDHA PO Take by mouth.   aspirin EC 81 MG tablet Take 1 tablet by mouth daily.   atorvastatin (LIPITOR) 10 MG tablet Take 1 tablet (10 mg total) by mouth every evening.   Biotin 2500 MCG CAPS Take 1 capsule by mouth daily.   Cholecalciferol (VITAMIN D3) 2000 units capsule Take 1 capsule by mouth daily.   dapagliflozin propanediol (FARXIGA) 10 MG TABS tablet Take 1 tablet (10 mg total) by mouth daily.   famotidine (PEPCID) 40 MG tablet Take 40 mg by mouth 2 (two) times daily.   levothyroxine (SYNTHROID) 112 MCG tablet Take 1 tablet (112 mcg total) by mouth daily.   melatonin 5 MG TABS Take 5 mg by mouth at bedtime as needed.   mesalamine (LIALDA) 1.2 g EC tablet Take by mouth. Take 2 tablet in am and 2 tablet in pm   metFORMIN (GLUCOPHAGE) 1000 MG tablet Take 1 tablet (1,000 mg total) by mouth 2 (two) times daily with a meal.   Misc Natural Products (TURMERIC CURCUMIN) CAPS Take 1 capsule by mouth daily.   Multiple Vitamin (MULTIVITAMIN) tablet Take 1 tablet by mouth daily.   ramipril (ALTACE) 10 MG capsule Take 1 capsule (10 mg total) by mouth daily.   No facility-administered encounter medications on file as of 07/21/2020.    Reviewed chart for medication changes and adherence.  No OVs, Consults, or hospital visits since last care coordination call / Pharmacist visit. No medication changes indicated  No gaps in adherence identified. Patient has follow up scheduled with pharmacy team. No further action required.  [place]

## 2020-07-28 ENCOUNTER — Ambulatory Visit: Payer: Medicare Other | Admitting: Hematology

## 2020-07-28 ENCOUNTER — Other Ambulatory Visit: Payer: Medicare Other

## 2020-07-30 ENCOUNTER — Telehealth: Payer: Self-pay

## 2020-07-30 NOTE — Chronic Care Management (AMB) (Signed)
    Chronic Care Management Pharmacy Assistant   Name: Michelle Sherman  MRN: [id number] DOB: [birth year]  Reason for Encounter: General Patient Call   Recent office visits:  None noted.  Recent consult visits:  None noted.  Hospital visits:  None in previous 6 months  Medications: Outpatient Encounter Medications as of 07/30/2020  Medication Sig   anastrozole (ARIMIDEX) 1 MG tablet Take 1 tablet (1 mg total) by mouth daily.   ASHWAGANDHA PO Take by mouth.   aspirin EC 81 MG tablet Take 1 tablet by mouth daily.   atorvastatin (LIPITOR) 10 MG tablet Take 1 tablet (10 mg total) by mouth every evening.   Biotin 2500 MCG CAPS Take 1 capsule by mouth daily.   Cholecalciferol (VITAMIN D3) 2000 units capsule Take 1 capsule by mouth daily.   dapagliflozin propanediol (FARXIGA) 10 MG TABS tablet Take 1 tablet (10 mg total) by mouth daily.   famotidine (PEPCID) 40 MG tablet Take 40 mg by mouth 2 (two) times daily.   levothyroxine (SYNTHROID) 112 MCG tablet Take 1 tablet (112 mcg total) by mouth daily.   melatonin 5 MG TABS Take 5 mg by mouth at bedtime as needed.   mesalamine (LIALDA) 1.2 g EC tablet Take by mouth. Take 2 tablet in am and 2 tablet in pm   metFORMIN (GLUCOPHAGE) 1000 MG tablet Take 1 tablet (1,000 mg total) by mouth 2 (two) times daily with a meal.   Misc Natural Products (TURMERIC CURCUMIN) CAPS Take 1 capsule by mouth daily.   Multiple Vitamin (MULTIVITAMIN) tablet Take 1 tablet by mouth daily.   ramipril (ALTACE) 10 MG capsule Take 1 capsule (10 mg total) by mouth daily.   No facility-administered encounter medications on file as of 07/30/2020.    Patient stated she has decreased eating out and decreased the amount of fried foods. She recently purchased a smoker and plans on using this more with meal planning. She has not been to the [facility] yet to sign up for the [employer] but plans to do in the near future. Her husband is also planning on joining with her. She  has not begun transitioning from gummies and stated she has several still on hand and plans to finish what she has first. Patient also reported she has discontinued Benadryl at bedtime and is currently only taking Melatonin at bedtime.   Have you had any problems recently with your health? Patient denied having any recent problems with her health.   Have you had any problems with your pharmacy? Patient denied any problems or issues with her current pharmacy.  What issues or side effects are you having with your medications? Patient denied having any current side effects or issues with her medications. She did report that her hot flashes with the anastrozole 1 mg tablet has improved tremendously.  What would you like me to pass along to [facility] for them to help you with?  Patient has no concerns at this time.   What can we do to take care of you better? Patient had no suggestions to offer.  Star Rating Drugs: Patient received through mail order Atorvastatin 10 mg Ramipril 10 mg   [employer], [place]

## 2020-08-02 ENCOUNTER — Ambulatory Visit: Payer: Medicare Other | Attending: General Surgery

## 2020-08-02 ENCOUNTER — Other Ambulatory Visit: Payer: Self-pay

## 2020-08-02 DIAGNOSIS — Z483 Aftercare following surgery for neoplasm: Secondary | ICD-10-CM | POA: Insufficient documentation

## 2020-08-02 NOTE — Therapy (Signed)
[place], [place], [address] Phone: [phone]   Fax:  [phone]  Physical Therapy Treatment  Patient Details  Name: Michelle Sherman MRN: [id number] Date of Birth: [birth year] Referring Provider (PT): Cira Rue   Encounter Date: 08/02/2020   PT End of Session - 08/02/20 1609     Visit Number 5   # unchanged due to screen only   PT Start Time 1602    PT Stop Time 1607    PT Time Calculation (min) 5 min    Activity Tolerance Patient tolerated treatment well    Behavior During Therapy [facility] for tasks assessed/performed             Past Medical History:  Diagnosis Date   Allergy    Arthritis    Breast cancer ([place]) 08/2019   right breast IDC   Cataract    Complication of anesthesia    unable to void after surgery and had to stay overnight    Diabetes mellitus without complication ([place])    Family history of adverse reaction to anesthesia    sister, hard to wake up    Family history of lung cancer    Family history of pancreatic cancer    GERD (gastroesophageal reflux disease)    Hypertension    Sleep apnea    wears CPAP nightly   Thyroid disease    Ulcerative colitis ([place])     Past Surgical History:  Procedure Laterality Date   BREAST LUMPECTOMY WITH RADIOACTIVE SEED AND SENTINEL LYMPH NODE BIOPSY Right 10/14/2019   Procedure: RIGHT BREAST LUMPECTOMY WITH RADIOACTIVE SEED AND SENTINEL LYMPH NODE BIOPSY;  Surgeon: [name], MD;  Location: [place];  Service: General;  Laterality: Right;   CHOLECYSTECTOMY     RE-EXCISION OF BREAST LUMPECTOMY Right 11/11/2019   Procedure: RIGHT RE-EXCISION OF BREAST LUMPECTOMY;  Surgeon: [name], MD;  Location: [place];  Service: General;  Laterality: Right;  RNFA   SKIN SURGERY  12/2015   Fatty tumor removed   [place]    There were no vitals filed for this visit.    Subjective Assessment - 08/02/20 1606     Subjective Pt returns for her 3 month L-Dex screen.    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 She had a right lumpectomy and sentinel node biopsy (2 negative nodes) on 10/14/2019. It is ER/PR positive and HER2 negative with a Ki67 of 5%. She cares for her husband who is in a motorized wheelchair and has bilateral BKA.                    L-DEX FLOWSHEETS - 08/02/20 1600       L-DEX LYMPHEDEMA SCREENING   Measurement Type Unilateral    L-DEX MEASUREMENT EXTREMITY Upper Extremity    POSITION  Standing    DOMINANT SIDE Right    At Risk Side Right    BASELINE SCORE (UNILATERAL) -2.2    L-DEX SCORE (UNILATERAL) 1.5    VALUE CHANGE (UNILAT) 3.7                                    PT Long Term Goals - 06/14/20 1314       PT LONG TERM GOAL #1   Title Pt will report 50% reduction in  right breast heaviness    Time 6    Period Weeks    Status Achieved      PT LONG TERM GOAL #2   Title Pt will purchase compression bra and be independent in its use    Time 6    Period Weeks    Status Achieved      PT LONG TERM GOAL #3   Title Pt will be independent in self breast MLD    Time 6    Period Weeks    Status Achieved                   Plan - 08/02/20 1610     Clinical Impression Statement Pt returns for her 3 month L-Dex screen. Her change from baseline of 3.7 is WNLs so no further treatment is required at this time except to cont every 3 month L-Dex screens which pt is agreeable to.    PT Next Visit Plan Cont every 3 month L-Dex screens for up to 2 years from her SLNB.    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   Plantar fasciitis 05/06/2017   Controlled type 2 diabetes mellitus without complication, without long-term current use of insulin ([place]) 11/01/2016   Hypertension associated with diabetes ([place]) 11/01/2016   Severe obesity (BMI 35.0-35.9 with comorbidity) ([place]) 11/01/2016   Combined hyperlipidemia associated with type 2 diabetes mellitus ([place]) 01/15/2012   Allergic rhinitis 10/05/2011   Ulcerative colitis without complications ([place]) 02/63/7858   Diverticulosis of colon 10/17/2010   Internal hemorrhoids 10/17/2010   Acquired hypothyroidism 10/04/2010   Osteoarthrosis of knee 10/04/2010   OSA on CPAP 06/28/2010    [name], PTA 08/02/2020, 4:15 PM  [place], [place], [address] Phone: [phone]   Fax:  [phone]  Name: Michelle Sherman MRN: [id number] Date of Birth: 04-22-1950

## 2020-08-03 DIAGNOSIS — G4733 Obstructive sleep apnea (adult) (pediatric): Secondary | ICD-10-CM | POA: Diagnosis not present

## 2020-08-03 DIAGNOSIS — I10 Essential (primary) hypertension: Secondary | ICD-10-CM | POA: Diagnosis not present

## 2020-08-05 ENCOUNTER — Other Ambulatory Visit: Payer: Self-pay | Admitting: *Deleted

## 2020-08-05 DIAGNOSIS — C50411 Malignant neoplasm of upper-outer quadrant of right female breast: Secondary | ICD-10-CM

## 2020-08-05 DIAGNOSIS — Z170 Estrogen receptor positive status [ER+]: Secondary | ICD-10-CM

## 2020-08-06 ENCOUNTER — Encounter: Payer: Self-pay | Admitting: Hematology

## 2020-08-06 ENCOUNTER — Inpatient Hospital Stay (HOSPITAL_BASED_OUTPATIENT_CLINIC_OR_DEPARTMENT_OTHER): Payer: Medicare Other | Admitting: Hematology

## 2020-08-06 ENCOUNTER — Inpatient Hospital Stay: Payer: Medicare Other | Attending: Hematology

## 2020-08-06 ENCOUNTER — Other Ambulatory Visit: Payer: Self-pay

## 2020-08-06 VITALS — BP 127/71 | HR 88 | Temp 98.7°F | Resp 20 | Ht 60.0 in | Wt 200.8 lb

## 2020-08-06 DIAGNOSIS — K219 Gastro-esophageal reflux disease without esophagitis: Secondary | ICD-10-CM | POA: Insufficient documentation

## 2020-08-06 DIAGNOSIS — C50411 Malignant neoplasm of upper-outer quadrant of right female breast: Secondary | ICD-10-CM | POA: Insufficient documentation

## 2020-08-06 DIAGNOSIS — Z7982 Long term (current) use of aspirin: Secondary | ICD-10-CM | POA: Insufficient documentation

## 2020-08-06 DIAGNOSIS — I10 Essential (primary) hypertension: Secondary | ICD-10-CM | POA: Diagnosis not present

## 2020-08-06 DIAGNOSIS — Z79811 Long term (current) use of aromatase inhibitors: Secondary | ICD-10-CM | POA: Diagnosis not present

## 2020-08-06 DIAGNOSIS — Z79899 Other long term (current) drug therapy: Secondary | ICD-10-CM | POA: Diagnosis not present

## 2020-08-06 DIAGNOSIS — Z801 Family history of malignant neoplasm of trachea, bronchus and lung: Secondary | ICD-10-CM | POA: Insufficient documentation

## 2020-08-06 DIAGNOSIS — Z7984 Long term (current) use of oral hypoglycemic drugs: Secondary | ICD-10-CM | POA: Insufficient documentation

## 2020-08-06 DIAGNOSIS — Z923 Personal history of irradiation: Secondary | ICD-10-CM | POA: Insufficient documentation

## 2020-08-06 DIAGNOSIS — E039 Hypothyroidism, unspecified: Secondary | ICD-10-CM | POA: Diagnosis not present

## 2020-08-06 DIAGNOSIS — M129 Arthropathy, unspecified: Secondary | ICD-10-CM | POA: Diagnosis not present

## 2020-08-06 DIAGNOSIS — M2550 Pain in unspecified joint: Secondary | ICD-10-CM | POA: Insufficient documentation

## 2020-08-06 DIAGNOSIS — R232 Flushing: Secondary | ICD-10-CM | POA: Diagnosis not present

## 2020-08-06 DIAGNOSIS — E119 Type 2 diabetes mellitus without complications: Secondary | ICD-10-CM | POA: Diagnosis not present

## 2020-08-06 DIAGNOSIS — E1136 Type 2 diabetes mellitus with diabetic cataract: Secondary | ICD-10-CM | POA: Insufficient documentation

## 2020-08-06 DIAGNOSIS — K5190 Ulcerative colitis, unspecified, without complications: Secondary | ICD-10-CM | POA: Insufficient documentation

## 2020-08-06 DIAGNOSIS — M1711 Unilateral primary osteoarthritis, right knee: Secondary | ICD-10-CM | POA: Insufficient documentation

## 2020-08-06 DIAGNOSIS — Z170 Estrogen receptor positive status [ER+]: Secondary | ICD-10-CM

## 2020-08-06 DIAGNOSIS — G4730 Sleep apnea, unspecified: Secondary | ICD-10-CM | POA: Diagnosis not present

## 2020-08-06 DIAGNOSIS — Z800 Family history of malignant neoplasm of digestive organs: Secondary | ICD-10-CM | POA: Insufficient documentation

## 2020-08-06 LAB — CBC WITH DIFFERENTIAL (CANCER CENTER ONLY)
Abs Immature Granulocytes: 0.04 10*3/uL (ref 0.00–0.07)
Basophils Absolute: 0.1 10*3/uL (ref 0.0–0.1)
Basophils Relative: 1 %
Eosinophils Absolute: 0.2 10*3/uL (ref 0.0–0.5)
Eosinophils Relative: 2 %
HCT: 43.3 % (ref 36.0–46.0)
Hemoglobin: 14.2 g/dL (ref 12.0–15.0)
Immature Granulocytes: 1 %
Lymphocytes Relative: 23 %
Lymphs Abs: 1.7 10*3/uL (ref 0.7–4.0)
MCH: 29.5 pg (ref 26.0–34.0)
MCHC: 32.8 g/dL (ref 30.0–36.0)
MCV: 90 fL (ref 80.0–100.0)
Monocytes Absolute: 0.6 10*3/uL (ref 0.1–1.0)
Monocytes Relative: 8 %
Neutro Abs: 4.7 10*3/uL (ref 1.7–7.7)
Neutrophils Relative %: 65 %
Platelet Count: 280 10*3/uL (ref 150–400)
RBC: 4.81 MIL/uL (ref 3.87–5.11)
RDW: 13.2 % (ref 11.5–15.5)
WBC Count: 7.3 10*3/uL (ref 4.0–10.5)
nRBC: 0 % (ref 0.0–0.2)

## 2020-08-06 LAB — CMP (CANCER CENTER ONLY)
ALT: 12 U/L (ref 0–44)
AST: 14 U/L — ABNORMAL LOW (ref 15–41)
Albumin: 3.8 g/dL (ref 3.5–5.0)
Alkaline Phosphatase: 83 U/L (ref 38–126)
Anion gap: 9 (ref 5–15)
BUN: 14 mg/dL (ref 8–23)
CO2: 27 mmol/L (ref 22–32)
Calcium: 9.7 mg/dL (ref 8.9–10.3)
Chloride: 104 mmol/L (ref 98–111)
Creatinine: 0.81 mg/dL (ref 0.44–1.00)
GFR, Estimated: >60 mL/min (ref >60)
Glucose, Bld: 177 mg/dL — ABNORMAL HIGH (ref 70–99)
Potassium: 4.3 mmol/L (ref 3.5–5.1)
Sodium: 140 mmol/L (ref 135–145)
Total Bilirubin: 0.4 mg/dL (ref 0.3–1.2)
Total Protein: 7.2 g/dL (ref 6.5–8.1)

## 2020-08-06 NOTE — Progress Notes (Signed)
Michelle Sherman   [phone] [phone]   Clinic Follow up Note   Patient Care Team: Leamon Arnt, MD as PCP - General (Family Medicine) Deneise Lever, MD as Consulting Physician (Pulmonary Disease) Juanita Craver, MD as Consulting Physician (Gastroenterology) Lyndee Hensen, PT as Physical Therapist (Physical Therapy) Trula Slade, DPM as Consulting Physician (Podiatry) Katy Apo, MD as Consulting Physician (Ophthalmology) Rockwell Germany, RN as Oncology Nurse Navigator Mauro Kaufmann, RN as Oncology Nurse Navigator [name], MD as Consulting Physician (General Surgery) Truitt Merle, MD as Consulting Physician (Hematology) Kyung Rudd, MD as Consulting Physician (Radiation Oncology) Alla Feeling, NP as Nurse Practitioner (Nurse Practitioner) Madelin Rear, [facility] as Pharmacist (Pharmacist)  Date of Service:  08/06/2020  CHIEF COMPLAINT: f/u of right breast cancer  SUMMARY OF ONCOLOGIC HISTORY: Oncology History Overview Note  Cancer Staging Malignant neoplasm of upper-outer quadrant of right breast in female, estrogen receptor positive ([place]) Staging form: Breast, AJCC 8th Edition - Clinical stage from 09/12/2019: Stage IA (cT1b, cN0, cM0, G1, ER+, PR+, HER2-) - Signed by Truitt Merle, MD on 09/23/2019    Malignant neoplasm of upper-outer quadrant of right breast in female, estrogen receptor positive ([place])  09/05/2019 Mammogram   IMPRESSION: Indeterminate 9 x 6 x 6 mm mass involving the OUTER RIGHT breast at the 9 o'clock position approximately 5 cm from the nipple at POSTERIOR depth, located anterior to a normal appearing intramammary lymph Node.      09/12/2019 Cancer Staging   Staging form: Breast, AJCC 8th Edition - Clinical stage from 09/12/2019: Stage IA (cT1b, cN0, cM0, G1, ER+, PR+, HER2-) - Signed by Truitt Merle, MD on 09/23/2019    09/12/2019 Initial Biopsy   Diagnosis 1. Breast, right, needle core biopsy, 9 o'clock  posterior - INVASIVE DUCTAL CARCINOMA WITH PAPILLARY FEATURES. 2. Breast, right, needle core biopsy, 9 o'clock anterior - INVASIVE DUCTAL CARCINOMA WITH PAPILLARY FEATURES. Microscopic Comment 1. - 2. The specimens share similar morphologic features. E-cadherin is positive and CK5/6 is negative. P63, Calponin and SMM-1 demonstrate the absence of myoepithelium. Ancillary studies will be reported separately. Results reported to [facility] on 09/15/2019. Intradepartmental consultation (Dr. Tresa Moore).   09/12/2019 Receptors her2   1. PROGNOSTIC INDICATORS Results: IMMUNOHISTOCHEMICAL AND MORPHOMETRIC ANALYSIS PERFORMED MANUALLY The tumor cells are NEGATIVE for Her2 (1+). Estrogen Receptor: 99%, POSITIVE, STRONG STAINING INTENSITY Progesterone Receptor: 99%, POSITIVE, STRONG STAINING INTENSITY Proliferation Marker Ki67: 5%   09/18/2019 Initial Diagnosis   Malignant neoplasm of upper-outer quadrant of right breast in female, estrogen receptor positive ([place])    10/05/2019 Genetic Testing   Negative genetic testing:  No pathogenic variants detected on the [facility] Breast Cancer STAT Panel + Common Hereditary Cancers Panel. 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 The report date is 10/05/2019.  The Breast Cancer STAT Panel offered by [facility] includes sequencing and deletion/duplication analysis for the following 9 genes:  ATM, BRCA1, BRCA2, CDH1, CHEK2, PALB2, PTEN, STK11 and TP53. The Common Hereditary Cancers Panel offered by [facility] includes sequencing and/or deletion duplication testing of the following 48 genes: APC, ATM, AXIN2, BARD1, BMPR1A, BRCA1, BRCA2, BRIP1, CDH1, CDK4, CDKN2A (p14ARF), CDKN2A (p16INK4a), CHEK2, CTNNA1, DICER1, EPCAM (Deletion/duplication testing only), GREM1 (promoter region deletion/duplication testing only), KIT, MEN1, MLH1, MSH2, MSH3, MSH6, MUTYH, NBN, NF1, NTHL1, PALB2, PDGFRA, PMS2, POLD1, POLE, PTEN,  RAD50, RAD51C, RAD51D, RNF43, SDHB, SDHC, SDHD, SMAD4, SMARCA4. STK11, TP53, TSC1, TSC2, and VHL.  The following  genes were evaluated for sequence changes only: SDHA and HOXB13 c.251G>A variant only.    10/14/2019 Pathology Results   RIGHT BREAST LUMPECTOMY WITH RADIOACTIVE SEED AND SENTINEL LYMPH NODE BIOPSY by Dr Barry Dienes    FINAL MICROSCOPIC DIAGNOSIS:   A. BREAST, RIGHT, LUMPECTOMY:  - Multifocal invasive ductal carcinoma with papillary features, grade 2,  spanning 0.9 cm and 0.5 cm.  - Intermediate grade ductal carcinoma in situ.  - Invasive carcinoma present at original inferior margin broadly, final  inferior margin (Part F) is negative.  - Margins are negative for in situ carcinoma.  - Biopsy site.  - See oncology table.   B. BREAST, RIGHT ADDITIONAL POSTERIOR MARGIN, EXCISION:  - Benign breast tissue.   C. BREAST, RIGHT ADDITIONAL LATERAL MARGIN, EXCISION:  - Benign breast tissue.   D. BREAST, RIGHT ADDITIONAL SUPERIOR MARGIN, EXCISION:  - Benign breast tissue.   E. BREAST, RIGHT ADDITIONAL MEDIAL MARGIN, EXCISION:  - Invasive ductal carcinoma with papillary features, grade 2, spanning  0.5 cm.  - Invasive carcinoma is present at the new medial margin broadly.   F. BREAST, RIGHT ADDITIONAL INFERIOR MARGIN, EXCISION:  - Benign breast tissue.   G. LYMPH NODE, RIGHT AXILLARY #1, SENTINEL, EXCISION:  - One of one lymph nodes negative for carcinoma (0/1).   H. LYMPH NODE, RIGHT AXILLARY #2, SENTINEL, EXCISION:  - One of one lymph nodes negative for carcinoma (0/1).   10/14/2019 Oncotype testing   Oncotype  Recurrence Score 0 with distant recurrence risk at 9 years of 3%.  There is less then 1% benefit of chemotherapy   10/14/2019 Cancer Staging   Staging form: Breast, AJCC 8th Edition - Pathologic stage from 10/14/2019: Stage IA (pT1b, pN0, cM0, G2, ER+, PR+, HER2-, Oncotype DX score: 0) - Signed by Truitt Merle, MD on 01/29/2020    11/11/2019 Pathology Results   RIGHT  RE-EXCISION OF BREAST LUMPECTOMY by Dr Barry Dienes    FINAL MICROSCOPIC DIAGNOSIS:   A. BREAST, RIGHT, RE-EXCISION OF MEDIAL MARGIN:  - Prior procedure site changes.  - No carcinoma identified.   COMMENT:   P63, Calponin and SMM-1 demonstrate the presence of myoepithelium in the  select focus.    12/22/2019 - 01/19/2020 Radiation Therapy   Adjuvant Radiation with Dr Lisbeth Renshaw    05/06/2020 Survivorship   SCP delivered by Cira Rue, NP       CURRENT THERAPY:  Adjuvant anastrozole, started 01/2020.  INTERVAL HISTORY:  Michelle Sherman is here for a follow up of breast cancer. She was last seen by me on 01/29/20 and in the survivorship clinic on 05/06/20. She presents to the clinic alone. She reports she is doing well overall. She notes she still has hot flashes, but they have improved as time went on. She reports a history of arthritis and wonders how much of this is baseline.  She also notes trigger finger in her right hand that is now present in a second finger.   All other systems were reviewed with the patient and are negative.  MEDICAL HISTORY:  Past Medical History:  Diagnosis Date   Allergy    Arthritis    Breast cancer ([place]) 08/2019   right breast IDC   Cataract    Complication of anesthesia    unable to void after surgery and had to stay overnight    Diabetes mellitus without complication [facility])    Family history of adverse reaction to anesthesia    sister, hard to wake up    Family history  of lung cancer    Family history of pancreatic cancer    GERD (gastroesophageal reflux disease)    Hypertension    Sleep apnea    wears CPAP nightly   Thyroid disease    Ulcerative colitis ([place])     SURGICAL HISTORY: Past Surgical History:  Procedure Laterality Date   BREAST LUMPECTOMY WITH RADIOACTIVE SEED AND SENTINEL LYMPH NODE BIOPSY Right 10/14/2019   Procedure: RIGHT BREAST LUMPECTOMY WITH RADIOACTIVE SEED AND SENTINEL LYMPH NODE BIOPSY;  Surgeon: [name], MD;   Location: [place];  Service: General;  Laterality: Right;   CHOLECYSTECTOMY     RE-EXCISION OF BREAST LUMPECTOMY Right 11/11/2019   Procedure: RIGHT RE-EXCISION OF BREAST LUMPECTOMY;  Surgeon: [name], MD;  Location: [place];  Service: General;  Laterality: Right;  RNFA   SKIN SURGERY  12/2015   Fatty tumor removed   [place]    I have reviewed the social history and family history with the patient and they are unchanged from previous note.  ALLERGIES:  is allergic to nickel, sulfa antibiotics, and bydureon [exenatide].  MEDICATIONS:  Current Outpatient Medications  Medication Sig Dispense Refill   anastrozole (ARIMIDEX) 1 MG tablet Take 1 tablet (1 mg total) by mouth daily. 30 tablet 5   ASHWAGANDHA PO Take by mouth.     aspirin EC 81 MG tablet Take 1 tablet by mouth daily.     atorvastatin (LIPITOR) 10 MG tablet Take 1 tablet (10 mg total) by mouth every evening. 90 tablet 3   Biotin 2500 MCG CAPS Take 1 capsule by mouth daily.     Cholecalciferol (VITAMIN D3) 2000 units capsule Take 1 capsule by mouth daily.     dapagliflozin propanediol (FARXIGA) 10 MG TABS tablet Take 1 tablet (10 mg total) by mouth daily. 90 tablet 3   famotidine (PEPCID) 40 MG tablet Take 40 mg by mouth 2 (two) times daily.     levothyroxine (SYNTHROID) 112 MCG tablet Take 1 tablet (112 mcg total) by mouth daily. 90 tablet 3   melatonin 5 MG TABS Take 5 mg by mouth at bedtime as needed.     mesalamine (LIALDA) 1.2 g EC tablet Take by mouth. Take 2 tablet in am and 2 tablet in pm     metFORMIN (GLUCOPHAGE) 1000 MG tablet Take 1 tablet (1,000 mg total) by mouth 2 (two) times daily with a meal. 180 tablet 3   Misc Natural Products (TURMERIC CURCUMIN) CAPS Take 1 capsule by mouth daily.     Multiple Vitamin (MULTIVITAMIN) tablet Take 1 tablet by mouth daily.     ramipril (ALTACE) 10 MG capsule Take 1 capsule (10 mg total) by mouth daily. 90  capsule 3   No current facility-administered medications for this visit.    PHYSICAL EXAMINATION: ECOG PERFORMANCE STATUS: 0 - Asymptomatic  Vitals:   08/06/20 1355  BP: 127/71  Pulse: 88  Resp: 20  Temp: 98.7 F (37.1 C)  SpO2: 97%   Filed Weights   08/06/20 1355  Weight: 200 lb 12.8 oz (91.1 kg)    GENERAL:alert, no distress and comfortable SKIN: skin color, texture, turgor are normal, no rashes or significant lesions EYES: normal, Conjunctiva are pink and non-injected, sclera clear  NECK: supple, thyroid normal size, non-tender, without nodularity LYMPH:  no palpable lymphadenopathy in the cervical, axillary  LUNGS: clear to auscultation and percussion with normal breathing effort HEART: regular rate & rhythm and no murmurs and no lower  extremity edema ABDOMEN:abdomen soft, non-tender and normal bowel sounds Musculoskeletal:no cyanosis of digits and no clubbing  NEURO: alert & oriented x 3 with fluent speech, no focal motor/sensory deficits BREAST: No palpable mass, nodules or adenopathy bilaterally. Breast exam benign.   LABORATORY DATA:  I have reviewed the data as listed CBC Latest Ref Rng & Units 08/06/2020 01/07/2020 09/24/2019  WBC 4.0 - 10.5 K/uL 7.3 5.8 7.5  Hemoglobin 12.0 - 15.0 g/dL 14.2 14.8 15.0  Hematocrit 36.0 - 46.0 % 43.3 44.2 47.3(H)  Platelets 150 - 400 K/uL 280 329 323     CMP Latest Ref Rng & Units 08/06/2020 01/07/2020 11/07/2019  Glucose 70 - 99 mg/dL 177(H) 119(H) 129(H)  BUN 8 - 23 mg/dL _0 Creatinine 0.44 - 1.00 mg/dL 0.81 0.55(L) 0.68  Sodium 135 - 145 mmol/L 140 140 138  Potassium 3.5 - 5.1 mmol/L 4.3 4.6 4.4  Chloride 98 - 111 mmol/L 104 102 101  CO2 22 - 32 mmol/L _1 Calcium 8.9 - 10.3 mg/dL 9.7 9.9 10.1  Total Protein 6.5 - 8.1 g/dL 7.2 6.6 -  Total Bilirubin 0.3 - 1.2 mg/dL 0.4 0.6 -  Alkaline Phos 38 - 126 U/L 83 - -  AST 15 - 41 U/L 14(L) 12 -  ALT 0 - 44 U/L 12 12 -      RADIOGRAPHIC STUDIES: I have  personally reviewed the radiological images as listed and agreed with the findings in the report. No results found.   ASSESSMENT & PLAN:  Michelle Sherman is a 70 y.o. female with   1. 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 -She underwent right lumpectomy with Dr Barry Dienes on 10/14/19 and right breast re-excision on 11/11/19 due to positive margins. Oncotype showed RS 0, given low risk disease I did not recommend chemotherapy. -To reduce her risk of local recurrence, she completed adjuvant Radiation 12/22/19-01/19/20 -Se began adjuvant anastrozole in 01/2020. She is tolerating well overall, with her main complaint being joint pain but so far manageable. She knows to contact us if this worsens, and we can switch her to a different antiestrogen at that time. -She is scheduled for annual mammography on 09/01/20. -f/u with NP Lacie in 4 months.  2. Arthritis, joint pain, trigger finger -she has arthritis at baseline, especially in her right knee. -we reviewed that her symptoms can be exacerbated by anastrozole. I advised her to contact us if it becomes intolerable, and we can switch her to a different antiestrogen. -she also notes she has trigger finger in her right hand, now present in a second finger.   2. Genetic Testing -Based on her family history of cancer, she is eligible for genetic testing.  -Her genetics was negative   3. Comorbidities: HTN, DM, Hypothyroidism -Well controlled on Medications. -Her weight is stable on anastrozole. Will continue to monitor. -Continue to f/u with PCP for management   4. Bone Health -Her 07/01/20 DEXA was normal (+1.1)     PLAN:  -continue anastrozole, she will call us if arthralgia gets worse -mammogram 09/01/20 as scheduled -Lab and follow-up in 4 months   No problem-specific  Assessment & Plan notes found for this encounter.   No orders of the defined types were placed in this encounter.  All questions were answered. The patient knows to call the clinic with any  problems, questions or concerns. No barriers to learning was detected. The total time spent in the appointment was 30 minutes.     Truitt Merle, MD 08/06/2020   I, Wilburn Mylar, am acting as scribe for Truitt Merle, MD.   I have reviewed the above documentation for accuracy and completeness, and I agree with the above.

## 2020-08-10 DIAGNOSIS — Z86010 Personal history of colonic polyps: Secondary | ICD-10-CM | POA: Diagnosis not present

## 2020-08-10 DIAGNOSIS — K219 Gastro-esophageal reflux disease without esophagitis: Secondary | ICD-10-CM | POA: Diagnosis not present

## 2020-08-10 DIAGNOSIS — R194 Change in bowel habit: Secondary | ICD-10-CM | POA: Diagnosis not present

## 2020-08-10 DIAGNOSIS — K5730 Diverticulosis of large intestine without perforation or abscess without bleeding: Secondary | ICD-10-CM | POA: Diagnosis not present

## 2020-08-10 DIAGNOSIS — K5190 Ulcerative colitis, unspecified, without complications: Secondary | ICD-10-CM | POA: Diagnosis not present

## 2020-08-15 NOTE — Progress Notes (Signed)
HPI female never smoker followed for OSA complicated by hypothyroid, DM 2, hyperlipidemia, allergic rhinitis, HBP, ulcerative colitis, morbid obesity NPSG 06/04/06- AHI ? 23/ hr, desaturation to 66%, CPAP titrated to 8, body weight 242 lbs HST 02/05/17-AHI 23.4/hour, desaturation to 86%, body weight 192 pounds -----------------------------------------------------------------------------   07/17/19- 70 year old female never smoker followed for OSA complicated by hypothyroid, DM 2, hyperlipidemia, allergic rhinitis, HBP, ulcerative colitis, morbid obesity CPAP auto 5-15/ [employer] Download compliance 100%, AHI 0.9/ hr Body weight today- 202 lbs Covax- 2 Moderna She wears both CPAP and an oral appliance. Says OAD not comfortable without airflow.   08/16/20- 70 year old female never smoker followed for OSA complicated by hypothyroid, DM 2, hyperlipidemia, allergic rhinitis, HBP, ulcerative colitis, morbid obesity, Breast Canceer R,  CPAP auto 5-15/ [employer] Download-compliance 100%, AHI 0.8/ hr Body weight today-198 lbs Covid vax- Tried oral appliance.Chose to stay with CPAP. No concerns.  Marland KitchenROS-see HPI   + = positive Constitutional:    weight loss, night sweats, fevers, chills, fatigue, lassitude. HEENT:    headaches, difficulty swallowing, tooth/dental problems, sore throat,       sneezing, itching, ear ache, nasal congestion, post nasal drip, snoring CV:    chest pain, orthopnea, PND, swelling in lower extremities, anasarca,                                   dizziness, palpitations Resp:   shortness of breath with exertion or at rest.                productive cough,   non-productive cough, coughing up of blood.              change in color of mucus.  wheezing.   Skin:    rash or lesions. GI:  No-   heartburn, indigestion, abdominal pain, nausea, vomiting, diarrhea,                 change in bowel habits, loss of appetite GU: dysuria, change in color of urine, no urgency or frequency.   flank  pain. MS:   joint pain, +stiffness, decreased range of motion, back pain. Neuro-     nothing unusual Psych:  change in mood or affect.  depression or anxiety.   memory loss.  OBJ- Physical Exam General- Alert, Oriented, Affect-appropriate, Distress- none acute, + obese Skin- rash-none, lesions- none, excoriation- none Lymphadenopathy- none Head- atraumatic            Eyes- Gross vision intact, PERRLA, conjunctivae and secretions clear            Ears- Hearing, canals-normal            Nose- Clear, no-Septal dev, mucus, polyps, erosion, perforation             Throat- Mallampati II-III , mucosa clear , drainage- none, tonsils- atrophic Neck- flexible , trachea midline, no stridor , thyroid nl, carotid no bruit Chest - symmetrical excursion , unlabored           Heart/CV- RRR , no murmur , no gallop  , no rub, nl s1 s2                           - JVD- none , edema- none, stasis changes- none, varices- none           Lung-    clear, wheeze- none, cough- none ,  dullness-none, rub- none           Chest wall-  Abd-  Br/ Gen/ Rectal- Not done, not indicated Extrem- cyanosis- none, clubbing, none, atrophy- none, strength- nl, + superficial varices L calf Neuro- grossly intact to observation

## 2020-08-16 ENCOUNTER — Encounter: Payer: Self-pay | Admitting: Internal Medicine

## 2020-08-16 ENCOUNTER — Ambulatory Visit (INDEPENDENT_AMBULATORY_CARE_PROVIDER_SITE_OTHER): Payer: Medicare Other | Admitting: Internal Medicine

## 2020-08-16 ENCOUNTER — Other Ambulatory Visit: Payer: Self-pay

## 2020-08-16 DIAGNOSIS — E039 Hypothyroidism, unspecified: Secondary | ICD-10-CM | POA: Diagnosis not present

## 2020-08-16 DIAGNOSIS — G4733 Obstructive sleep apnea (adult) (pediatric): Secondary | ICD-10-CM

## 2020-08-16 DIAGNOSIS — Z9989 Dependence on other enabling machines and devices: Secondary | ICD-10-CM | POA: Diagnosis not present

## 2020-08-16 NOTE — Patient Instructions (Signed)
We can continue CPAP auto 5-15/ [employer]  Please call if we can help

## 2020-08-25 DIAGNOSIS — R194 Change in bowel habit: Secondary | ICD-10-CM | POA: Diagnosis not present

## 2020-08-25 DIAGNOSIS — Z1211 Encounter for screening for malignant neoplasm of colon: Secondary | ICD-10-CM | POA: Diagnosis not present

## 2020-08-25 DIAGNOSIS — K5190 Ulcerative colitis, unspecified, without complications: Secondary | ICD-10-CM | POA: Diagnosis not present

## 2020-08-25 DIAGNOSIS — K5730 Diverticulosis of large intestine without perforation or abscess without bleeding: Secondary | ICD-10-CM | POA: Diagnosis not present

## 2020-08-25 LAB — HM COLONOSCOPY

## 2020-09-01 ENCOUNTER — Other Ambulatory Visit: Payer: Self-pay

## 2020-09-01 ENCOUNTER — Ambulatory Visit
Admission: RE | Admit: 2020-09-01 | Discharge: 2020-09-01 | Disposition: A | Discharge status: Home / Self Care | Payer: Medicare Other | Source: Ambulatory Visit | Attending: Nurse Practitioner | Admitting: Nurse Practitioner

## 2020-09-01 DIAGNOSIS — Z170 Estrogen receptor positive status [ER+]: Secondary | ICD-10-CM

## 2020-09-01 DIAGNOSIS — R922 Inconclusive mammogram: Secondary | ICD-10-CM | POA: Diagnosis not present

## 2020-09-01 DIAGNOSIS — C50411 Malignant neoplasm of upper-outer quadrant of right female breast: Secondary | ICD-10-CM

## 2020-09-01 DIAGNOSIS — Z853 Personal history of malignant neoplasm of breast: Secondary | ICD-10-CM | POA: Diagnosis not present

## 2020-09-10 DIAGNOSIS — N6489 Other specified disorders of breast: Secondary | ICD-10-CM | POA: Insufficient documentation

## 2020-09-10 DIAGNOSIS — Z170 Estrogen receptor positive status [ER+]: Secondary | ICD-10-CM | POA: Diagnosis not present

## 2020-09-10 DIAGNOSIS — C50411 Malignant neoplasm of upper-outer quadrant of right female breast: Secondary | ICD-10-CM | POA: Diagnosis not present

## 2020-09-10 HISTORY — DX: Other specified disorders of breast: N64.89

## 2020-09-24 ENCOUNTER — Telehealth: Payer: Self-pay | Admitting: Pharmacist

## 2020-09-24 NOTE — Chronic Care Management (AMB) (Addendum)
Chronic Care Management Pharmacy Assistant   Name: Michelle Sherman  MRN: [id number] DOB: [birth year]   Reason for Encounter: General Adherence Call    Recent office visits:  None  Recent consult visits:  09/10/2020 Michelle Newcomer, MD; [facility]  08/16/2020 OV (pulmonology) Michelle Lever, MD; no medication changes indicated.  08/06/2020 OV (oncology) Michelle Merle, MD; no medication changes indicated.  Hospital visits:  None in previous 6 months  Medications: Outpatient Encounter Medications as of 09/24/2020  Medication Sig   anastrozole (ARIMIDEX) 1 MG tablet Take 1 tablet (1 mg total) by mouth daily.   ASHWAGANDHA PO Take by mouth.   aspirin EC 81 MG tablet Take 1 tablet by mouth daily.   atorvastatin (LIPITOR) 10 MG tablet Take 1 tablet (10 mg total) by mouth every evening.   Biotin 2500 MCG CAPS Take 1 capsule by mouth daily.   Cholecalciferol (VITAMIN D3) 2000 units capsule Take 1 capsule by mouth daily.   dapagliflozin propanediol (FARXIGA) 10 MG TABS tablet Take 1 tablet (10 mg total) by mouth daily.   famotidine (PEPCID) 40 MG tablet Take 40 mg by mouth 2 (two) times daily.   levothyroxine (SYNTHROID) 112 MCG tablet Take 1 tablet (112 mcg total) by mouth daily.   melatonin 5 MG TABS Take 5 mg by mouth at bedtime as needed.   mesalamine (LIALDA) 1.2 g EC tablet Take by mouth. Take 2 tablet in am and 2 tablet in pm   metFORMIN (GLUCOPHAGE) 1000 MG tablet Take 1 tablet (1,000 mg total) by mouth 2 (two) times daily with a meal.   Misc Natural Products (TURMERIC CURCUMIN) CAPS Take 1 capsule by mouth daily.   Multiple Vitamin (MULTIVITAMIN) tablet Take 1 tablet by mouth daily.   ramipril (ALTACE) 10 MG capsule Take 1 capsule (10 mg total) by mouth daily.   No facility-administered encounter medications on file as of 09/24/2020.    Patient Questions: Have you had any problems recently with your health? Patient states she has not had any problems  recently with her health.  Have you had any problems with your pharmacy? Patient states she had not had any problems recently with her pharmacy.  What issues or side effects are you having with your medications? Patient states she is having issues with her medication Anastrozole. She states this medication is causing her to have joint pains. She states the joint pains are starting to get worse. She plans on discussing this more with her oncologist.  What would you like me to pass along to Leata Mouse, CPP for him to help you with?  Patient does not have anything to pass along at this time.  What can we do to take care of you better? Patient did not have any suggestions.    Care Gaps: Ophthalmology Exam: Next Due 12/07/2020 Zoster Vaccines- Shingrix: Completed COVID-19 Vaccination: (4 - Booster for [employer]) Overdue since 07/16/2020 Foot Exam: Next due on 07/15/2021 PVA Vaccination: completed Influenza Vaccination: Overdue since 08/23/2020 Hemoglobin A1C: Next due on 01/14/2021 Colonoscopy: Next due on 04/01/2026 Tetanus/DTAP: Next due on 10/03/2020 Mammogram: Next due on 09/01/2021 Dexa Scan: Next due on 07/02/2023 HPV Vaccines: Aged [employer] [facility]: Completed  Future Appointments  Date Time Provider [place]  10/27/2020  1:30 PM Leamon Arnt, MD [facility]  11/01/2020  3:00 PM Suanne Marker, PTA [facility] None  12/08/2020  1:30 PM [facility] None  12/08/2020  2:00 PM Burr Medico,  Krista Blue, MD [place] None  01/07/2021  9:00 AM Leamon Arnt, MD [facility]  01/25/2021  1:00 PM [facility] CCM PHARMACIST [facility]  04/11/2021  1:00 PM [facility] HEALTH COACH [facility]  08/17/2021 11:30 AM Young, Kasandra Knudsen, MD [facility] None    Star [facility] Drugs: No filled dates available, patient receives her medications from [facility]   April D Calhoun, [place] Pharmacist Assistant [phone]

## 2020-10-08 ENCOUNTER — Other Ambulatory Visit: Payer: Self-pay

## 2020-10-08 ENCOUNTER — Encounter: Payer: Self-pay | Admitting: Family Medicine

## 2020-10-08 ENCOUNTER — Ambulatory Visit (INDEPENDENT_AMBULATORY_CARE_PROVIDER_SITE_OTHER): Payer: Medicare Other | Admitting: Family Medicine

## 2020-10-08 VITALS — BP 116/60 | HR 98 | Temp 98.3°F | Ht 61.0 in | Wt 194.4 lb

## 2020-10-08 DIAGNOSIS — R5383 Other fatigue: Secondary | ICD-10-CM | POA: Diagnosis not present

## 2020-10-08 DIAGNOSIS — Z20822 Contact with and (suspected) exposure to covid-19: Secondary | ICD-10-CM

## 2020-10-08 NOTE — Progress Notes (Signed)
Subjective  CC:  Chief Complaint  Patient presents with   Generalized Body Aches    Joint pain   Chills    Ongoing for about a week. Has not taken a covid test   Fatigue    HPI: Michelle Sherman is a 70 y.o. female who presents to the office today to address the problems listed above in the chief complaint. 70 yo with flu like [id number], fatigue, st, congestion and body aches. Started about a week ago. She is vaccinated. No n/v/d or abdominal pain. Fatigue is her worse sxs. Eating an drinking ok.    Assessment  1. Suspected COVID-19 virus infection   2. Other fatigue      Plan  Flu like illness:  supportive care and test for covid. Has passed window of antivirals. Rest,fluids and f/u if not improving.   Follow up: prn  10/27/2020  Orders Placed This Encounter  Procedures   Novel Coronavirus, NAA ([facility])   SARS-COV-2, NAA 2 DAY TAT   POC Influenza A&B ([facility] test)   No orders of the defined types were placed in this encounter.     I reviewed the patients updated PMH, FH, and SocHx.    Patient Active Problem List   Diagnosis Date Noted   Controlled type 2 diabetes mellitus without complication, without long-term current use of insulin ([place]) 11/01/2016    Priority: High   Hypertension associated with diabetes ([place]) 11/01/2016    Priority: High   Severe obesity (BMI 35.0-35.9 with comorbidity) ([place]) 11/01/2016    Priority: High   Combined hyperlipidemia associated with type 2 diabetes mellitus ([place]) 01/15/2012    Priority: High   Ulcerative colitis without complications ([place]) 84/66/5993    Priority: High   Acquired hypothyroidism 10/04/2010    Priority: High   OSA on CPAP 06/28/2010    Priority: High   Diverticulosis of colon 10/17/2010    Priority: Medium   Osteoarthrosis of knee 10/04/2010    Priority: Medium   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   ASHWAGANDHA PO Take by mouth.   aspirin EC 81 MG tablet Take 1 tablet by mouth daily.   atorvastatin (LIPITOR) 10 MG tablet Take 1 tablet (10 mg total) by mouth every evening.   Biotin 2500 MCG CAPS Take 1 capsule by mouth daily.   Cholecalciferol (VITAMIN D3) 2000 units capsule Take 1 capsule by mouth daily.   dapagliflozin propanediol (FARXIGA) 10 MG TABS tablet Take 1 tablet (10 mg total) by mouth daily.   famotidine (PEPCID) 40 MG tablet Take 40 mg by mouth 2 (two) times daily.   levothyroxine (SYNTHROID) 112 MCG tablet Take 1 tablet (112 mcg total) by mouth daily.   melatonin 5 MG TABS Take 5 mg by mouth at bedtime as needed.   mesalamine (LIALDA) 1.2 g EC tablet Take by mouth. Take 2 tablet in am and 2 tablet in pm   metFORMIN (GLUCOPHAGE) 1000 MG  tablet Take 1 tablet (1,000 mg total) by mouth 2 (two) times daily with a meal.   Misc Natural Products (TURMERIC CURCUMIN) CAPS Take 1 capsule by mouth daily.   Multiple Vitamin (MULTIVITAMIN) tablet Take 1 tablet by mouth daily.   ramipril (ALTACE) 10 MG capsule Take 1 capsule (10 mg total) by mouth daily.    Allergies: Patient is allergic to nickel, sulfa antibiotics, and bydureon [exenatide]. Family History: Patient family history includes Cancer in her brother and paternal aunt; Crohn's disease in her brother; Diabetes in her brother, brother, maternal aunt, maternal uncle, and mother; Heart attack in her paternal grandfather; Heart disease in her father; Lung cancer in her paternal aunt; Lung cancer (age of onset: 90)  in her father; Other in her sister; Pancreatic cancer (age of onset: 64) in her brother; Ulcerative colitis in her brother and mother; Varicose Veins in her sister. Social History:  Patient  reports that she has never smoked. She has never used smokeless tobacco. She reports current alcohol use. She reports that she does not use drugs.  Review of Systems: Constitutional: Negative for fever positive for fatigue, malaise or anorexia Cardiovascular: negative for chest pain Respiratory: negative for SOB or persistent cough Gastrointestinal: negative for abdominal pain  Objective  Vitals: BP 116/60   Pulse 98   Temp 98.3 F (36.8 C) (Temporal)   Ht 5' 1"  (1.549 m)   Wt 194 lb 6.4 oz (88.2 kg)   SpO2 95%   BMI 36.73 kg/m  General: no acute distress , A&Ox3, nontoxic HEENT: PEERL, conjunctiva normal, neck is supple Cardiovascular:  RRR without murmur or gallop.  Respiratory:  Good breath sounds bilaterally, CTAB with normal respiratory effort Skin:  Warm, no rashes    Commons side effects, risks, benefits, and alternatives for medications and treatment plan prescribed today were discussed, and the patient expressed understanding of the given instructions. Patient is instructed to call or message via MyChart if he/she has any questions or concerns regarding our treatment plan. No barriers to understanding were identified. We discussed Red Flag symptoms and signs in detail. Patient expressed understanding regarding what to do in case of urgent or emergency type symptoms.  Medication list was reconciled, printed and provided to the patient in AVS. Patient instructions and summary information was reviewed with the patient as documented in the AVS. This note was prepared with assistance of Dragon voice recognition software. Occasional wrong-word or sound-a-like substitutions may have occurred due to the inherent limitations of voice recognition software  This visit occurred during the SARS-CoV-2  public health emergency.  Safety protocols were in place, including screening questions prior to the visit, additional usage of staff PPE, and extensive cleaning of exam room while observing appropriate contact time as indicated for disinfecting solutions.

## 2020-10-08 NOTE — Patient Instructions (Signed)
Please follow up as scheduled for your next visit with me: 10/27/2020   It is very possible you have a covid infection.  Rest, hydrate and advil for your symptoms should help. Let me know if anything worsens.   I will release your lab results to you on your MyChart account with further instructions. Please reply with any questions.    If you have any questions or concerns, please don't hesitate to send me a message via MyChart or call the office at [phone]. Thank you for visiting with Michelle Sherman today! It's our pleasure caring for you.   COVID-19: What to Do if You Are Sick If you test positive and are an older adult or someone who is at high risk of getting very sick from COVID-19, treatment may be available. Contact a healthcare provider right away after a positive test to determine if you are eligible, even if your symptoms are mild right now. You can also visit a Test to Treat location and, if eligible, receive a prescription from a provider. Don't delay: Treatment must be started within the first few days to be effective. If you have a fever, cough, or other symptoms, you might have COVID-19. Most people have mild illness and are able to recover at home. If you are sick: Keep track of your symptoms. If you have an emergency warning sign (including trouble breathing), call 911. Steps to help prevent the spread of COVID-19 if you are sick If you are sick with COVID-19 or think you might have COVID-19, follow the steps below to care for yourself and to help protect other people in your home and community. Stay home except to get medical care Stay home. Most people with COVID-19 have mild illness and can recover at home without medical care. Do not leave your home, except to get medical care. Do not visit public areas and do not go to places where you are unable to wear a mask. Take care of yourself. Get rest and stay hydrated. Take over-the-counter medicines, such as acetaminophen, to help you feel  better. Stay in touch with your doctor. Call before you get medical care. Be sure to get care if you have trouble breathing, or have any other emergency warning signs, or if you think it is an emergency. Avoid public transportation, ride-sharing, or taxis if possible. Get tested If you have symptoms of COVID-19, get tested. While waiting for test results, stay away from others, including staying apart from those living in your household. Get tested as soon as possible after your symptoms start. Treatments may be available for people with COVID-19 who are at risk for becoming very sick. Don't delay: Treatment must be started early to be effective--some treatments must begin within 5 days of your first symptoms. Contact your healthcare provider right away if your test result is positive to determine if you are eligible. Self-tests are one of several options for testing for the virus that causes COVID-19 and may be more convenient than laboratory-based tests and point-of-care tests. Ask your healthcare provider or your local health department if you need help interpreting your test results. You can visit your state, tribal, local, and territorial health department's website to look for the latest local information on testing sites. Separate yourself from other people As much as possible, stay in a specific room and away from other people and pets in your home. If possible, you should use a separate bathroom. If you need to be around other people or animals in or  outside of the home, wear a well-fitting mask. Tell your close contacts that they may have been exposed to COVID-19. An infected person can spread COVID-19 starting 48 hours (or 2 days) before the person has any symptoms or tests positive. By letting your close contacts know they may have been exposed to COVID-19, you are helping to protect everyone. See COVID-19 and Animals if you have questions about pets. If you are diagnosed with COVID-19,  someone from the health department may call you. Answer the call to slow the spread. Monitor your symptoms Symptoms of COVID-19 include fever, cough, or other symptoms. Follow care instructions from your healthcare provider and local health department. Your local health authorities may give instructions on checking your symptoms and reporting information. When to seek emergency medical attention Look for emergency warning signs* for COVID-19. If someone is showing any of these signs, seek emergency medical care immediately: Trouble breathing Persistent pain or pressure in the chest New confusion Inability to wake or stay awake Pale, gray, or blue-colored skin, lips, or nail beds, depending on skin tone *This list is not all possible symptoms. Please call your medical provider for any other symptoms that are severe or concerning to you. Call 911 or call ahead to your local emergency facility: Notify the operator that you are seeking care for someone who has or may have COVID-19. Call ahead before visiting your doctor Call ahead. Many medical visits for routine care are being postponed or done by phone or telemedicine. If you have a medical appointment that cannot be postponed, call your doctor's office, and tell them you have or may have COVID-19. This will help the office protect themselves and other patients. If you are sick, wear a well-fitting mask You should wear a mask if you must be around other people or animals, including pets (even at home). Wear a mask with the best fit, protection, and comfort for you. You don't need to wear the mask if you are alone. If you can't put on a mask (because of trouble breathing, for example), cover your coughs and sneezes in some other way. Try to stay at least 6 feet away from other people. This will help protect the people around you. Masks should not be placed on young children under age 57 years, anyone who has trouble breathing, or anyone who is not  able to remove the mask without help. Cover your coughs and sneezes Cover your mouth and nose with a tissue when you cough or sneeze. Throw away used tissues in a lined trash can. Immediately wash your hands with soap and water for at least 20 seconds. If soap and water are not available, clean your hands with an alcohol-based hand sanitizer that contains at least 60% alcohol. Clean your hands often Wash your hands often with soap and water for at least 20 seconds. This is especially important after blowing your nose, coughing, or sneezing; going to the bathroom; and before eating or preparing food. Use hand sanitizer if soap and water are not available. Use an alcohol-based hand sanitizer with at least 60% alcohol, covering all surfaces of your hands and rubbing them together until they feel dry. Soap and water are the best option, especially if hands are visibly dirty. Avoid touching your eyes, nose, and mouth with unwashed hands. Handwashing Tips Avoid sharing personal household items Do not share dishes, drinking glasses, cups, eating utensils, towels, or bedding with other people in your home. Wash these items thoroughly after using them with  soap and water or put in the dishwasher. Clean surfaces in your home regularly Clean and disinfect high-touch surfaces (for example, doorknobs, tables, handles, light switches, and countertops) in your "sick room" and bathroom. In shared spaces, you should clean and disinfect surfaces and items after each use by the person who is ill. If you are sick and cannot clean, a caregiver or other person should only clean and disinfect the area around you (such as your bedroom and bathroom) on an as needed basis. Your caregiver/other person should wait as long as possible (at least several hours) and wear a mask before entering, cleaning, and disinfecting shared spaces that you use. Clean and disinfect areas that may have blood, stool, or body fluids on them. Use  household cleaners and disinfectants. Clean visible dirty surfaces with household cleaners containing soap or detergent. Then, use a household disinfectant. Use a product from [employer] List N: Disinfectants for Coronavirus (SJGGE-36). Be sure to follow the instructions on the label to ensure safe and effective use of the product. Many products recommend keeping the surface wet with a disinfectant for a certain period of time (look at "contact time" on the product label). You may also need to wear personal protective equipment, such as gloves, depending on the directions on the product label. Immediately after disinfecting, wash your hands with soap and water for 20 seconds. For completed guidance on cleaning and disinfecting your home, visit Complete Disinfection Guidance. Take steps to improve ventilation at home Improve ventilation (air flow) at home to help prevent from spreading COVID-19 to other people in your household. Clear out COVID-19 virus particles in the air by opening windows, using air filters, and turning on fans in your home. Use this interactive tool to learn how to improve air flow in your home. When you can be around others after being sick with COVID-19 Deciding when you can be around others is different for different situations. Find out when you can safely end home isolation. For any additional questions about your care, contact your healthcare provider or state or local health department. 04/13/2020 Content source: [facility] ([facility]), Division of Viral Diseases This information is not intended to replace advice given to you by your health care provider. Make sure you discuss any questions you have with your health care provider. Document Revised: 05/27/2020 Document Reviewed: 05/27/2020 Elsevier Patient Education  [place].

## 2020-10-10 LAB — SARS-COV-2, NAA 2 DAY TAT

## 2020-10-10 LAB — NOVEL CORONAVIRUS, NAA: SARS-CoV-2, NAA: NOT DETECTED

## 2020-10-11 ENCOUNTER — Other Ambulatory Visit: Payer: Self-pay

## 2020-10-11 ENCOUNTER — Encounter: Payer: Self-pay | Admitting: Family Medicine

## 2020-10-11 DIAGNOSIS — R0602 Shortness of breath: Secondary | ICD-10-CM

## 2020-10-11 LAB — POC INFLUENZA A&B (BINAX/QUICKVUE)
Influenza A, POC: NEGATIVE
Influenza B, POC: NEGATIVE

## 2020-10-12 ENCOUNTER — Ambulatory Visit (INDEPENDENT_AMBULATORY_CARE_PROVIDER_SITE_OTHER)
Admission: RE | Admit: 2020-10-12 | Discharge: 2020-10-12 | Disposition: A | Discharge status: Home / Self Care | Payer: Medicare Other | Source: Ambulatory Visit | Attending: Family Medicine | Admitting: Family Medicine

## 2020-10-12 ENCOUNTER — Other Ambulatory Visit: Payer: Self-pay

## 2020-10-12 DIAGNOSIS — R0602 Shortness of breath: Secondary | ICD-10-CM | POA: Diagnosis not present

## 2020-10-12 DIAGNOSIS — R531 Weakness: Secondary | ICD-10-CM | POA: Diagnosis not present

## 2020-10-12 DIAGNOSIS — R0789 Other chest pain: Secondary | ICD-10-CM | POA: Diagnosis not present

## 2020-10-13 ENCOUNTER — Ambulatory Visit (INDEPENDENT_AMBULATORY_CARE_PROVIDER_SITE_OTHER): Payer: Medicare Other | Admitting: Family Medicine

## 2020-10-13 ENCOUNTER — Encounter: Payer: Self-pay | Admitting: Family Medicine

## 2020-10-13 VITALS — BP 118/64 | HR 93 | Temp 98.6°F | Ht 61.0 in | Wt 194.0 lb

## 2020-10-13 DIAGNOSIS — E119 Type 2 diabetes mellitus without complications: Secondary | ICD-10-CM

## 2020-10-13 DIAGNOSIS — R0602 Shortness of breath: Secondary | ICD-10-CM

## 2020-10-13 DIAGNOSIS — B179 Acute viral hepatitis, unspecified: Secondary | ICD-10-CM | POA: Diagnosis not present

## 2020-10-13 DIAGNOSIS — J0100 Acute maxillary sinusitis, unspecified: Secondary | ICD-10-CM

## 2020-10-13 MED ORDER — AMOXICILLIN-POT CLAVULANATE 875-125 MG PO TABS: 1.0000 | ORAL_TABLET | Freq: Two times a day (BID) | ORAL | 0 refills | Status: DC

## 2020-10-13 NOTE — Progress Notes (Signed)
Subjective  CC:  Chief Complaint  Patient presents with   Chest Pain    Feels like a pulling feeling    HPI: Michelle Sherman is a 70 y.o. female who presents to the office today to address the problems listed above in the chief complaint. See last note and pt message: evaluated last week for presumed covid infection w/typical symptoms: cough, congestion, headache, fatigue. Flu and covid pcr test were both negative. Pt reports overall feels better but still with pressure, blood tinge sinus drainage, and a pulling sensation in chest without plerutic cp, sob, doe, f/c/s or calf pain/swelling. Brain fog has improved. No palpitations.  Cxr reviewed and is normal.  Dm has been controlled. Denies sxs of lows. But fatigue is present (improving) Assessment  1. Acute non-recurrent maxillary sinusitis   2. Shortness of breath   3. Controlled type 2 diabetes mellitus without complication, without long-term current use of insulin (HCC)      Plan  sinusitis:  will treat for sinusitis as these are now the predominant sxs. Augmentin ordered. Humidifier and supportive care. Vitals are normal  Sob: d-dimer, low likelihood of PE givnen nl rr, heart rate and pulse ox.  DM: check a1c. Avoid low blood sugars. Hydrate.    Follow up:   10/27/2020  Orders Placed This Encounter  Procedures   CBC with Differential/Platelet   D-dimer, quantitative   Comprehensive metabolic panel   Hemoglobin A1c   Meds ordered this encounter  Medications   amoxicillin-clavulanate (AUGMENTIN) 875-125 MG tablet    Sig: Take 1 tablet by mouth 2 (two) times daily.    Dispense:  14 tablet    Refill:  0      I reviewed the patients updated PMH, FH, and SocHx.    Patient Active Problem List   Diagnosis Date Noted   Controlled type 2 diabetes mellitus without complication, without long-term current use of insulin ([place]) 11/01/2016    Priority: High   Hypertension associated with diabetes ([place]) 11/01/2016     Priority: High   Severe obesity (BMI 35.0-35.9 with comorbidity) ([place]) 11/01/2016    Priority: High   Combined hyperlipidemia associated with type 2 diabetes mellitus ([place]) 01/15/2012    Priority: High   Ulcerative colitis without complications ([place]) 34/28/7681    Priority: High   Acquired hypothyroidism 10/04/2010    Priority: High   OSA on CPAP 06/28/2010    Priority: High   Diverticulosis of colon 10/17/2010    Priority: Medium   Osteoarthrosis of knee 10/04/2010    Priority: Medium   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   anastrozole (ARIMIDEX) 1 MG tablet Take 1 tablet (1 mg total) by mouth daily.   ASHWAGANDHA PO Take by mouth.   aspirin EC 81 MG tablet Take 1 tablet by mouth daily.   atorvastatin (LIPITOR) 10 MG tablet Take 1 tablet (10 mg total) by mouth every evening.   Biotin 2500 MCG  CAPS Take 1 capsule by mouth daily.   Cholecalciferol (VITAMIN D3) 2000 units capsule Take 1 capsule by mouth daily.   dapagliflozin propanediol (FARXIGA) 10 MG TABS tablet Take 1 tablet (10 mg total) by mouth daily.   famotidine (PEPCID) 40 MG tablet Take 40 mg by mouth 2 (two) times daily.   levothyroxine (SYNTHROID) 112 MCG tablet Take 1 tablet (112 mcg total) by mouth daily.   melatonin 5 MG TABS Take 5 mg by mouth at bedtime as needed.   mesalamine (LIALDA) 1.2 g EC tablet Take by mouth. Take 2 tablet in am and 2 tablet in pm   metFORMIN (GLUCOPHAGE)  1000 MG tablet Take 1 tablet (1,000 mg total) by mouth 2 (two) times daily with a meal.   Misc Natural Products (TURMERIC CURCUMIN) CAPS Take 1 capsule by mouth daily.   Multiple Vitamin (MULTIVITAMIN) tablet Take 1 tablet by mouth daily.   ramipril (ALTACE) 10 MG capsule Take 1 capsule (10 mg total) by mouth daily.    Allergies: Patient is allergic to nickel, sulfa antibiotics, and bydureon [exenatide]. Family History: Patient family history includes Cancer in her brother and paternal aunt; Crohn's disease in her brother; Diabetes in her brother, brother, maternal aunt, maternal uncle, and mother; Heart attack in her paternal grandfather; Heart disease in her father; Lung cancer in her paternal aunt; Lung cancer (age of onset: 73) in her father; Other in her sister; Pancreatic cancer (age of onset: 46) in her brother; Ulcerative colitis in her brother and mother; Varicose Veins in her sister. Social History:  Patient  reports that she has never smoked. She has never used smokeless tobacco. She reports current alcohol use. She reports that she does not use drugs.  Review of Systems: Constitutional: Negative for fever malaise or anorexia Cardiovascular: negative for chest pain Respiratory: negative for SOB or persistent cough Gastrointestinal: negative for abdominal pain  Objective  Vitals: BP 118/64   Pulse 93   Temp 98.6 F (37 C) (Temporal)   Ht 5' 1"  (1.549 m)   Wt 194 lb (88 kg)   SpO2 97%   BMI 36.66 kg/m  General: no acute distress , A&Ox3, appears well HEENT: PEERL, conjunctiva normal, neck is supple, sinus ttp present Cardiovascular:  RRR without murmur or gallop.  Respiratory:  Good breath sounds bilaterally, CTAB with normal respiratory effort Skin:  Warm, no rashes Calves w/o edema, cords or ttp    Commons side effects, risks, benefits, and alternatives for medications and treatment plan prescribed today were discussed, and the patient expressed understanding of the  given instructions. Patient is instructed to call or message via MyChart if he/she has any questions or concerns regarding our treatment plan. No barriers to understanding were identified. We discussed Red Flag symptoms and signs in detail. Patient expressed understanding regarding what to do in case of urgent or emergency type symptoms.  Medication list was reconciled, printed and provided to the patient in AVS. Patient instructions and summary information was reviewed with the patient as documented in the AVS. This note was prepared with assistance of Dragon voice recognition software. Occasional wrong-word or sound-a-like substitutions may have occurred due to the inherent limitations of voice recognition software  This visit occurred during the SARS-CoV-2 public health emergency.  Safety protocols were in place, including screening questions prior to the visit, additional usage of staff PPE, and extensive cleaning of exam room while observing appropriate contact time as indicated for disinfecting solutions.

## 2020-10-13 NOTE — Patient Instructions (Signed)
Please follow up as scheduled for your next visit with me: 10/27/2020   Take the antibiotics and let's see if we can't get you over this quickly.   If you have any questions or concerns, please don't hesitate to send me a message via MyChart or call the office at [phone]. Thank you for visiting with [place] today! It's our pleasure caring for you.

## 2020-10-14 ENCOUNTER — Telehealth: Payer: Self-pay

## 2020-10-14 ENCOUNTER — Other Ambulatory Visit: Payer: Self-pay

## 2020-10-14 DIAGNOSIS — B179 Acute viral hepatitis, unspecified: Secondary | ICD-10-CM

## 2020-10-14 LAB — CBC WITH DIFFERENTIAL/PLATELET
Basophils Absolute: 0 10*3/uL (ref 0.0–0.1)
Basophils Relative: 0.7 % (ref 0.0–3.0)
Eosinophils Absolute: 0 10*3/uL (ref 0.0–0.7)
Eosinophils Relative: 0.9 % (ref 0.0–5.0)
HCT: 38.2 % (ref 36.0–46.0)
Hemoglobin: 12.9 g/dL (ref 12.0–15.0)
Lymphocytes Relative: 22.2 % (ref 12.0–46.0)
Lymphs Abs: 1 10*3/uL (ref 0.7–4.0)
MCHC: 33.7 g/dL (ref 30.0–36.0)
MCV: 85.1 fl (ref 78.0–100.0)
Monocytes Absolute: 0.5 10*3/uL (ref 0.1–1.0)
Monocytes Relative: 11.5 % (ref 3.0–12.0)
Neutro Abs: 3 10*3/uL (ref 1.4–7.7)
Neutrophils Relative %: 64.7 % (ref 43.0–77.0)
Platelets: 282 10*3/uL (ref 150.0–400.0)
RBC: 4.49 Mil/uL (ref 3.87–5.11)
RDW: 13.7 % (ref 11.5–15.5)
WBC: 4.6 10*3/uL (ref 4.0–10.5)

## 2020-10-14 LAB — COMPREHENSIVE METABOLIC PANEL
ALT: 230 U/L — ABNORMAL HIGH (ref 0–35)
AST: 195 U/L — ABNORMAL HIGH (ref 0–37)
Albumin: 3.6 g/dL (ref 3.5–5.2)
Alkaline Phosphatase: 674 U/L — ABNORMAL HIGH (ref 39–117)
BUN: 15 mg/dL (ref 6–23)
CO2: 25 mEq/L (ref 19–32)
Calcium: 10 mg/dL (ref 8.4–10.5)
Chloride: 97 mEq/L (ref 96–112)
Creatinine, Ser: 0.58 mg/dL (ref 0.40–1.20)
GFR: 91.21 mL/min (ref >60.00)
Glucose, Bld: 149 mg/dL — ABNORMAL HIGH (ref 70–99)
Potassium: 3.4 mEq/L — ABNORMAL LOW (ref 3.5–5.1)
Sodium: 132 mEq/L — ABNORMAL LOW (ref 135–145)
Total Bilirubin: 1.8 mg/dL — ABNORMAL HIGH (ref 0.2–1.2)
Total Protein: 6.7 g/dL (ref 6.0–8.3)

## 2020-10-14 LAB — HEMOGLOBIN A1C: Hgb A1c MFr Bld: 7.5 % — ABNORMAL HIGH (ref 4.6–6.5)

## 2020-10-14 NOTE — Telephone Encounter (Signed)
Patient was prescribed antibiotics and then her blood work was resulted and wants to know if medication is still necessary, and also stated she was diagnosed with hepatitis and needs to know what kind Please advise ?

## 2020-10-14 NOTE — Telephone Encounter (Signed)
Let patient know you were out of the office and would get back to me with answers tomorrow at the latest.  She has not started the antibiotic let her know she can hold off until she hears back from [place].

## 2020-10-14 NOTE — Addendum Note (Signed)
Addended by: Billey Chang on: 10/14/2020 12:27 PM   Modules accepted: Orders

## 2020-10-14 NOTE — Progress Notes (Signed)
Please call patient: I have reviewed his/her lab results. Labs show acute hepatitis or inflammation of the liver. This is why she is feeling badly but we need to look into the cause. There is no sign of infection from the blood work.please I've ordered a stat RUQ ultrasound and link to acute hepatitis. Please f/u with lisa today to make sure this gets done. HOLD lipitor. To hospital if has abdominal pain, fever, or n/v.  No tylenol.  Need to add on labs: i've ordered them. She may need to return for another lab draw.

## 2020-10-15 ENCOUNTER — Other Ambulatory Visit: Payer: Self-pay

## 2020-10-15 ENCOUNTER — Ambulatory Visit (HOSPITAL_COMMUNITY)
Admission: RE | Admit: 2020-10-15 | Discharge: 2020-10-15 | Disposition: A | Discharge status: Home / Self Care | Payer: Medicare Other | Source: Ambulatory Visit | Attending: Family Medicine | Admitting: Family Medicine

## 2020-10-15 DIAGNOSIS — B179 Acute viral hepatitis, unspecified: Secondary | ICD-10-CM | POA: Insufficient documentation

## 2020-10-15 NOTE — Telephone Encounter (Signed)
Patient states she is going out of town on Monday and will be gone for a full week. Will pick up antibiotic, still feels like she has a cold.

## 2020-10-18 ENCOUNTER — Other Ambulatory Visit: Payer: Self-pay | Admitting: Family Medicine

## 2020-10-18 DIAGNOSIS — B179 Acute viral hepatitis, unspecified: Secondary | ICD-10-CM

## 2020-10-19 ENCOUNTER — Ambulatory Visit: Payer: Medicare Other | Admitting: Family Medicine

## 2020-10-27 ENCOUNTER — Other Ambulatory Visit: Payer: Self-pay

## 2020-10-27 ENCOUNTER — Encounter: Payer: Self-pay | Admitting: Family Medicine

## 2020-10-27 ENCOUNTER — Ambulatory Visit (INDEPENDENT_AMBULATORY_CARE_PROVIDER_SITE_OTHER): Payer: Medicare Other | Admitting: Family Medicine

## 2020-10-27 VITALS — BP 110/70 | HR 77 | Temp 98.1°F | Ht 61.0 in | Wt 190.2 lb

## 2020-10-27 DIAGNOSIS — Z23 Encounter for immunization: Secondary | ICD-10-CM | POA: Diagnosis not present

## 2020-10-27 DIAGNOSIS — E1169 Type 2 diabetes mellitus with other specified complication: Secondary | ICD-10-CM

## 2020-10-27 DIAGNOSIS — B179 Acute viral hepatitis, unspecified: Secondary | ICD-10-CM | POA: Diagnosis not present

## 2020-10-27 DIAGNOSIS — M791 Myalgia, unspecified site: Secondary | ICD-10-CM | POA: Diagnosis not present

## 2020-10-27 DIAGNOSIS — E11649 Type 2 diabetes mellitus with hypoglycemia without coma: Secondary | ICD-10-CM

## 2020-10-27 DIAGNOSIS — E782 Mixed hyperlipidemia: Secondary | ICD-10-CM | POA: Diagnosis not present

## 2020-10-27 LAB — COMPREHENSIVE METABOLIC PANEL WITH GFR
ALT: 26 U/L (ref 0–35)
AST: 23 U/L (ref 0–37)
Albumin: 4.2 g/dL (ref 3.5–5.2)
Alkaline Phosphatase: 184 U/L — ABNORMAL HIGH (ref 39–117)
BUN: 11 mg/dL (ref 6–23)
CO2: 28 meq/L (ref 19–32)
Calcium: 10.1 mg/dL (ref 8.4–10.5)
Chloride: 102 meq/L (ref 96–112)
Creatinine, Ser: 0.55 mg/dL (ref 0.40–1.20)
GFR: 92.36 mL/min (ref >60.00)
Glucose, Bld: 113 mg/dL — ABNORMAL HIGH (ref 70–99)
Potassium: 4.2 meq/L (ref 3.5–5.1)
Sodium: 138 meq/L (ref 135–145)
Total Bilirubin: 0.8 mg/dL (ref 0.2–1.2)
Total Protein: 7.2 g/dL (ref 6.0–8.3)

## 2020-10-27 LAB — CK: Total CK: 263 U/L — ABNORMAL HIGH (ref 7–177)

## 2020-10-27 NOTE — Progress Notes (Signed)
Subjective  CC:  Chief Complaint  Patient presents with   Hepatitis    HPI: Michelle Sherman is a 70 y.o. female who presents to the office today to address the problems listed above in the chief complaint. See last 2 visits notes and labs: f/u from illness assoicated with vague abd tightness, myalgias and joint pains (she thought due to arimedex) and sinus sxs since resolved with allergy meds. Labs revealed hyponatremia, and elevated lfts in the 100s with mild bilirubin elevation. A liver ultrasound was normal. We stopped the lipitor and within 48 hours all of her sxs resolved. She now is back to her baseline. No recent travel or exposure to hep A. No abdominal pain, jaundice, f/c. Energy level has normalized as well. No longer having myalgias or joint pains. She has been on lipitor for years.  DM: recent a1c elevated 7.5 on farxiga 10 and met 1000 bid. Diet is fair. Lots of stress due to brother now in hospice for pancreatic cancer.   Assessment  1. Acute hepatitis   2. Uncontrolled type 2 diabetes mellitus with hypoglycemia without coma ([place])   3. Combined hyperlipidemia associated with type 2 diabetes mellitus ([place])   4. Myalgia   5. Need for immunization against influenza      Plan  hepatits:  ? All related to lipitor. Will check/repeat lab evaluation today. Sxs resolved so expect improvement in lfts. Education given.  DM: now uncontrolled: work on diet and recheck 3 months. Can add glp-1 at that time if indicated.  ? Myositis: check ck  Follow up: cpe  01/07/2021  Orders Placed This Encounter  Procedures   Flu Vaccine QUAD High Dose([facility])   Comprehensive metabolic panel   CK (Creatine Kinase)   No orders of the defined types were placed in this encounter.     I reviewed the patients updated PMH, FH, and SocHx.    Patient Active Problem List   Diagnosis Date Noted   Controlled type 2 diabetes mellitus without complication, without long-term current use of insulin  ([place]) 11/01/2016    Priority: 1.   Hypertension associated with diabetes ([place]) 11/01/2016    Priority: 1.   Severe obesity (BMI 35.0-35.9 with comorbidity) ([place]) 11/01/2016    Priority: 1.   Combined hyperlipidemia associated with type 2 diabetes mellitus ([place]) 01/15/2012    Priority: 1.   Ulcerative colitis without complications ([place]) 36/62/9476    Priority: 1.   Acquired hypothyroidism 10/04/2010    Priority: 1.   OSA on CPAP 06/28/2010    Priority: 1.   Diverticulosis of colon 10/17/2010    Priority: 2.   Osteoarthrosis of knee 10/04/2010    Priority: 2.   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   Allergic rhinitis 10/05/2011    Priority: 3.   Internal hemorrhoids 10/17/2010    Priority: 3.   Genetic testing 10/06/2019   Family history of pancreatic cancer    Family history of lung cancer    Malignant neoplasm of upper-outer quadrant of right breast in female, estrogen receptor positive ([place]) 09/18/2019   Incomplete uterovaginal prolapse 04/03/2019   Plantar fasciitis 05/06/2017   Current Meds  Medication Sig   anastrozole (ARIMIDEX) 1 MG tablet Take 1 tablet (1 mg total) by mouth daily.   ASHWAGANDHA PO Take by mouth.   aspirin EC 81 MG tablet Take 1 tablet by mouth daily.   Biotin 2500 MCG CAPS Take 1 capsule by mouth daily.   Cholecalciferol (VITAMIN D3)  2000 units capsule Take 1 capsule by mouth daily.   dapagliflozin propanediol (FARXIGA) 10 MG TABS tablet Take 1 tablet (10 mg total) by mouth daily.   famotidine (PEPCID) 40 MG tablet Take 40 mg by mouth 2 (two) times daily.   levothyroxine (SYNTHROID) 112 MCG tablet Take 1 tablet (112 mcg total) by mouth daily.   melatonin 5 MG TABS Take 5 mg by mouth at bedtime as needed.   mesalamine (LIALDA) 1.2 g EC tablet Take by mouth. Take 2 tablet in am and 2 tablet in pm   metFORMIN (GLUCOPHAGE) 1000 MG tablet Take 1 tablet (1,000 mg total) by mouth 2 (two) times daily with a meal.   Misc Natural Products  (TURMERIC CURCUMIN) CAPS Take 1 capsule by mouth daily.   Multiple Vitamin (MULTIVITAMIN) tablet Take 1 tablet by mouth daily.   ramipril (ALTACE) 10 MG capsule Take 1 capsule (10 mg total) by mouth daily.   [DISCONTINUED] atorvastatin (LIPITOR) 10 MG tablet Take 1 tablet (10 mg total) by mouth every evening.    Allergies: Patient is allergic to nickel, sulfa antibiotics, and bydureon [exenatide]. Family History: Patient family history includes Cancer in her brother and paternal aunt; Crohn's disease in her brother; Diabetes in her brother, brother, maternal aunt, maternal uncle, and mother; Heart attack in her paternal grandfather; Heart disease in her father; Lung cancer in her paternal aunt; Lung cancer (age of onset: 78) in her father; Other in her sister; Pancreatic cancer (age of onset: 92) in her brother; Ulcerative colitis in her brother and mother; Varicose Veins in her sister. Social History:  Patient  reports that she has never smoked. She has never used smokeless tobacco. She reports current alcohol use. She reports that she does not use drugs.  Review of Systems: Constitutional: Negative for fever malaise or anorexia Cardiovascular: negative for chest pain Respiratory: negative for SOB or persistent cough Gastrointestinal: negative for abdominal pain  Objective  Vitals: BP 110/70   Pulse 77   Temp 98.1 F (36.7 C) (Temporal)   Ht _0  (1.549 m)   Wt 190 lb 3.2 oz (86.3 kg)   SpO2 97%   BMI 35.94 kg/m  General: no acute distress , A&Ox3, appears well HEENT: PEERL, conjunctiva normal, neck is supple Cardiovascular:  RRR without murmur or gallop.  Gastrointestinal: soft, flat abdomen, normal active bowel sounds, no palpable masses, no hepatosplenomegaly, no appreciated hernias nontender Respiratory:  Good breath sounds bilaterally, CTAB with normal respiratory effort Skin:  Warm, no rashes, no jaundice    Commons side effects, risks, benefits, and alternatives for  medications and treatment plan prescribed today were discussed, and the patient expressed understanding of the given instructions. Patient is instructed to call or message via MyChart if he/she has any questions or concerns regarding our treatment plan. No barriers to understanding were identified. We discussed Red Flag symptoms and signs in detail. Patient expressed understanding regarding what to do in case of urgent or emergency type symptoms.  Medication list was reconciled, printed and provided to the patient in AVS. Patient instructions and summary information was reviewed with the patient as documented in the AVS. This note was prepared with assistance of Dragon voice recognition software. Occasional wrong-word or sound-a-like substitutions may have occurred due to the inherent limitations of voice recognition software  This visit occurred during the SARS-CoV-2 public health emergency.  Safety protocols were in place, including screening questions prior to the visit, additional usage of staff PPE, and extensive cleaning of exam  room while observing appropriate contact time as indicated for disinfecting solutions.

## 2020-10-27 NOTE — Addendum Note (Signed)
Addended by: Gean Birchwood on: 10/27/2020 04:59 PM   Modules accepted: Orders

## 2020-10-27 NOTE — Patient Instructions (Signed)
Please follow up as scheduled for your next visit with me: 01/07/2021   I will release your lab results to you on your MyChart account with further instructions. Please reply with any questions.    Please work on diet and I will recheck your a1c in December. If remains elevated, I will adjust your medications.   Do not restart lipitor.   If you have any questions or concerns, please don't hesitate to send me a message via MyChart or call the office at [phone]. Thank you for visiting with [place] today! It's our pleasure caring for you.

## 2020-10-28 ENCOUNTER — Encounter: Payer: Self-pay | Admitting: Family Medicine

## 2020-10-28 DIAGNOSIS — K752 Nonspecific reactive hepatitis: Secondary | ICD-10-CM | POA: Insufficient documentation

## 2020-10-28 DIAGNOSIS — T466X5A Adverse effect of antihyperlipidemic and antiarteriosclerotic drugs, initial encounter: Secondary | ICD-10-CM | POA: Insufficient documentation

## 2020-10-28 DIAGNOSIS — G720 Drug-induced myopathy: Secondary | ICD-10-CM | POA: Insufficient documentation

## 2020-10-28 HISTORY — DX: Nonspecific reactive hepatitis: K75.2

## 2020-10-28 LAB — HEPATITIS PANEL, ACUTE
Hep A IgM: NONREACTIVE
Hep B C IgM: NONREACTIVE
Hepatitis B Surface Ag: NONREACTIVE
Hepatitis C Ab: NONREACTIVE
SIGNAL TO CUT-OFF: 0.01 (ref <1.00)

## 2020-10-28 NOTE — Addendum Note (Signed)
Addended by: Gean Birchwood on: 10/28/2020 11:59 AM   Modules accepted: Orders

## 2020-11-01 ENCOUNTER — Ambulatory Visit: Payer: Medicare Other | Attending: General Surgery

## 2020-11-01 ENCOUNTER — Other Ambulatory Visit: Payer: Self-pay

## 2020-11-01 VITALS — Wt 189.0 lb

## 2020-11-01 DIAGNOSIS — Z483 Aftercare following surgery for neoplasm: Secondary | ICD-10-CM | POA: Insufficient documentation

## 2020-11-01 NOTE — Therapy (Signed)
[facility] @ [place], [place], [address] Phone: [phone]   Fax:  [phone]  Physical Therapy Treatment  Patient Details  Name: Michelle Sherman MRN: [id number] Date of Birth: 1950/08/21 Referring Provider (PT): Cira Rue   Encounter Date: 11/01/2020   PT End of Session - 11/01/20 1517     Visit Number 5   # unchanged due to screen only   PT Start Time 1515    PT Stop Time 1520    PT Time Calculation (min) 5 min    Activity Tolerance Patient tolerated treatment well    Behavior During Therapy [facility] for tasks assessed/performed             Past Medical History:  Diagnosis Date   Allergy    Arthritis    Breast cancer ([employer]) 08/2019   right breast IDC   Cataract    Complication of anesthesia    unable to void after surgery and had to stay overnight    Diabetes mellitus without complication ([place])    Family history of adverse reaction to anesthesia    sister, hard to wake up    Family history of lung cancer    Family history of pancreatic cancer    GERD (gastroesophageal reflux disease)    Hypertension    Sleep apnea    wears CPAP nightly   Thyroid disease    Ulcerative colitis ([place])     Past Surgical History:  Procedure Laterality Date   BREAST LUMPECTOMY WITH RADIOACTIVE SEED AND SENTINEL LYMPH NODE BIOPSY Right 10/14/2019   Procedure: RIGHT BREAST LUMPECTOMY WITH RADIOACTIVE SEED AND SENTINEL LYMPH NODE BIOPSY;  Surgeon: [name], MD;  Location: [facility];  Service: General;  Laterality: Right;   CHOLECYSTECTOMY     RE-EXCISION OF BREAST LUMPECTOMY Right 11/11/2019   Procedure: RIGHT RE-EXCISION OF BREAST LUMPECTOMY;  Surgeon: [name], MD;  Location: [facility];  Service: General;  Laterality: Right;  RNFA   SKIN SURGERY  12/2015   Fatty tumor removed   TONSILLECTOMY AND ADENOIDECTOMY  1956    Vitals:   11/01/20 1516  Weight: 189 lb (85.7  kg)     Subjective Assessment - 11/01/20 1516     Subjective Pt returns for her 3 month L-Dex screen.    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 She had a right lumpectomy and sentinel node biopsy (2 negative nodes) on 10/14/2019. It is ER/PR positive and HER2 negative with a Ki67 of 5%. She cares for her husband who is in a motorized wheelchair and has bilateral BKA.                    L-DEX FLOWSHEETS - 11/01/20 1500       L-DEX LYMPHEDEMA SCREENING   Measurement Type Unilateral    L-DEX MEASUREMENT EXTREMITY Upper Extremity    POSITION  Standing    DOMINANT SIDE Right    At Risk Side Right    BASELINE SCORE (UNILATERAL) -2.2    L-DEX SCORE (UNILATERAL) 1    VALUE CHANGE (UNILAT) 3.2                                     PT Long Term Goals - 06/14/20 1314       PT LONG TERM GOAL #  1   Title Pt will report 50% reduction in right breast heaviness    Time 6    Period Weeks    Status Achieved      PT LONG TERM GOAL #2   Title Pt will purchase compression bra and be independent in its use    Time 6    Period Weeks    Status Achieved      PT LONG TERM GOAL #3   Title Pt will be independent in self breast MLD    Time 6    Period Weeks    Status Achieved                   Plan - 11/01/20 1517     Clinical Impression Statement Pt returns for her 3 month L-Dex screen. Her change from baseline of 3.2 is WNLs so no further treatment is required at this time except to cont every 3 month L-Dex screens which pt is agreeable to.    PT Next Visit Plan Cont every 3 month L-Dex screens for up to 2 years from her SLNB (~10/13/21)    Consulted and Agree with Plan of Care Patient             Patient will benefit from skilled therapeutic intervention in order to improve the following deficits and impairments:     Visit Diagnosis: Aftercare following surgery  for neoplasm     Problem List Patient Active Problem List   Diagnosis Date Noted   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   Nonspecific reactive hepatitis 10/28/2020   Genetic testing 10/06/2019   Family history of pancreatic cancer    Family history of lung cancer    Malignant neoplasm of upper-outer quadrant of right breast in female, estrogen receptor positive ([place]) 09/18/2019   Incomplete uterovaginal prolapse 04/03/2019   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   Plantar fasciitis 05/06/2017   Controlled type 2 diabetes mellitus without complication, without long-term current use of insulin ([place]) 11/01/2016   Hypertension associated with diabetes ([place]) 11/01/2016   Severe obesity (BMI 35.0-35.9 with comorbidity) ([place]) 11/01/2016   Combined hyperlipidemia associated with type 2 diabetes mellitus ([place]) 01/15/2012   Allergic rhinitis 10/05/2011   Ulcerative colitis without complications ([place]) 29/03/7953   Diverticulosis of colon 10/17/2010   Internal hemorrhoids 10/17/2010   Acquired hypothyroidism 10/04/2010   Osteoarthrosis of knee 10/04/2010   OSA on CPAP 06/28/2010    [name], PTA 11/01/2020, 3:21 PM  [facility] @ [place], [place], [address] Phone: [phone]   Fax:  [phone]  Name: SUNDUS PETE MRN: [id number] Date of Birth: [birth year]

## 2020-12-07 ENCOUNTER — Other Ambulatory Visit: Payer: Self-pay | Admitting: *Deleted

## 2020-12-07 DIAGNOSIS — C50411 Malignant neoplasm of upper-outer quadrant of right female breast: Secondary | ICD-10-CM

## 2020-12-07 DIAGNOSIS — Z170 Estrogen receptor positive status [ER+]: Secondary | ICD-10-CM

## 2020-12-08 ENCOUNTER — Inpatient Hospital Stay: Payer: Medicare Other

## 2020-12-08 ENCOUNTER — Inpatient Hospital Stay: Payer: Medicare Other | Attending: Hematology | Admitting: Hematology

## 2020-12-08 ENCOUNTER — Encounter: Payer: Self-pay | Admitting: Hematology

## 2020-12-08 ENCOUNTER — Other Ambulatory Visit: Payer: Self-pay

## 2020-12-08 VITALS — BP 125/59 | HR 79 | Temp 97.5°F | Resp 17 | Ht 61.0 in | Wt 193.1 lb

## 2020-12-08 DIAGNOSIS — Z801 Family history of malignant neoplasm of trachea, bronchus and lung: Secondary | ICD-10-CM | POA: Insufficient documentation

## 2020-12-08 DIAGNOSIS — I10 Essential (primary) hypertension: Secondary | ICD-10-CM | POA: Diagnosis not present

## 2020-12-08 DIAGNOSIS — Z7982 Long term (current) use of aspirin: Secondary | ICD-10-CM | POA: Diagnosis not present

## 2020-12-08 DIAGNOSIS — M129 Arthropathy, unspecified: Secondary | ICD-10-CM | POA: Diagnosis not present

## 2020-12-08 DIAGNOSIS — E079 Disorder of thyroid, unspecified: Secondary | ICD-10-CM | POA: Diagnosis not present

## 2020-12-08 DIAGNOSIS — Z79811 Long term (current) use of aromatase inhibitors: Secondary | ICD-10-CM | POA: Insufficient documentation

## 2020-12-08 DIAGNOSIS — C50411 Malignant neoplasm of upper-outer quadrant of right female breast: Secondary | ICD-10-CM | POA: Insufficient documentation

## 2020-12-08 DIAGNOSIS — G4730 Sleep apnea, unspecified: Secondary | ICD-10-CM | POA: Diagnosis not present

## 2020-12-08 DIAGNOSIS — K219 Gastro-esophageal reflux disease without esophagitis: Secondary | ICD-10-CM | POA: Insufficient documentation

## 2020-12-08 DIAGNOSIS — E039 Hypothyroidism, unspecified: Secondary | ICD-10-CM | POA: Diagnosis not present

## 2020-12-08 DIAGNOSIS — E1136 Type 2 diabetes mellitus with diabetic cataract: Secondary | ICD-10-CM | POA: Diagnosis not present

## 2020-12-08 DIAGNOSIS — Z79899 Other long term (current) drug therapy: Secondary | ICD-10-CM | POA: Insufficient documentation

## 2020-12-08 DIAGNOSIS — Z923 Personal history of irradiation: Secondary | ICD-10-CM | POA: Insufficient documentation

## 2020-12-08 DIAGNOSIS — Z800 Family history of malignant neoplasm of digestive organs: Secondary | ICD-10-CM | POA: Insufficient documentation

## 2020-12-08 DIAGNOSIS — Z170 Estrogen receptor positive status [ER+]: Secondary | ICD-10-CM | POA: Insufficient documentation

## 2020-12-08 LAB — CBC WITH DIFFERENTIAL (CANCER CENTER ONLY)
Abs Immature Granulocytes: 0.02 10*3/uL (ref 0.00–0.07)
Basophils Absolute: 0.1 10*3/uL (ref 0.0–0.1)
Basophils Relative: 1 %
Eosinophils Absolute: 0.1 10*3/uL (ref 0.0–0.5)
Eosinophils Relative: 2 %
HCT: 43.8 % (ref 36.0–46.0)
Hemoglobin: 14.1 g/dL (ref 12.0–15.0)
Immature Granulocytes: 0 %
Lymphocytes Relative: 30 %
Lymphs Abs: 1.8 10*3/uL (ref 0.7–4.0)
MCH: 29.1 pg (ref 26.0–34.0)
MCHC: 32.2 g/dL (ref 30.0–36.0)
MCV: 90.5 fL (ref 80.0–100.0)
Monocytes Absolute: 0.4 10*3/uL (ref 0.1–1.0)
Monocytes Relative: 7 %
Neutro Abs: 3.5 10*3/uL (ref 1.7–7.7)
Neutrophils Relative %: 60 %
Platelet Count: 338 10*3/uL (ref 150–400)
RBC: 4.84 MIL/uL (ref 3.87–5.11)
RDW: 13.4 % (ref 11.5–15.5)
WBC Count: 5.9 10*3/uL (ref 4.0–10.5)
nRBC: 0 % (ref 0.0–0.2)

## 2020-12-08 LAB — CMP (CANCER CENTER ONLY)
ALT: 13 U/L (ref 0–44)
AST: 13 U/L — ABNORMAL LOW (ref 15–41)
Albumin: 4 g/dL (ref 3.5–5.0)
Alkaline Phosphatase: 86 U/L (ref 38–126)
Anion gap: 10 (ref 5–15)
BUN: 17 mg/dL (ref 8–23)
CO2: 26 mmol/L (ref 22–32)
Calcium: 10.3 mg/dL (ref 8.9–10.3)
Chloride: 103 mmol/L (ref 98–111)
Creatinine: 0.75 mg/dL (ref 0.44–1.00)
GFR, Estimated: >60 mL/min (ref >60)
Glucose, Bld: 173 mg/dL — ABNORMAL HIGH (ref 70–99)
Potassium: 4.7 mmol/L (ref 3.5–5.1)
Sodium: 139 mmol/L (ref 135–145)
Total Bilirubin: 0.3 mg/dL (ref 0.3–1.2)
Total Protein: 7.5 g/dL (ref 6.5–8.1)

## 2020-12-08 MED ORDER — ANASTROZOLE 1 MG PO TABS: 1.0000 mg | ORAL_TABLET | Freq: Every day | ORAL | 1 refills | Status: DC

## 2020-12-08 NOTE — Progress Notes (Signed)
[place]   [phone] [phone]   Clinic Follow up Note   Patient Care Team: Leamon Arnt, MD as PCP - General (Family Medicine) Deneise Lever, MD as Consulting Physician (Pulmonary Disease) Juanita Craver, MD as Consulting Physician (Gastroenterology) Lyndee Hensen, PT as Physical Therapist (Physical Therapy) Trula Slade, DPM as Consulting Physician (Podiatry) Katy Apo, MD as Consulting Physician (Ophthalmology) Rockwell Germany, RN as Oncology Nurse Navigator Mauro Kaufmann, RN as Oncology Nurse Navigator [name], MD as Consulting Physician (General Surgery) Truitt Merle, MD as Consulting Physician (Hematology) Kyung Rudd, MD as Consulting Physician (Radiation Oncology) Alla Feeling, NP as Nurse Practitioner (Nurse Practitioner) Madelin Rear, [facility] as Pharmacist (Pharmacist)  Date of Service:  12/08/2020  CHIEF COMPLAINT: f/u of right breast cancer  CURRENT THERAPY:  Adjuvant anastrozole, started 01/2020.  ASSESSMENT & PLAN:  Michelle Sherman is a 70 y.o. female with   1. 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 -She underwent right lumpectomy with Dr Barry Dienes on 10/14/19 and right breast re-excision on 11/11/19 due to positive margins. Oncotype showed RS 0, low risk. -she completed adjuvant Radiation 12/22/19-01/19/20 -Se began adjuvant anastrozole in 01/2020. She is tolerating well overall, with her main complaint being joint pain but so far manageable. She knows to contact us if this worsens. I will switch it to exemestane if needed.  -most recent MM on 09/01/20 was benign with seroma in right breast. -she is clinically doing well. Labs reviewed, overall no concern. Her physical exam was unremarkable (breast tissue is "lumpy"). There is no clinical  concern for recurrence. -f/u with NP Lacie in 6 months.   2. Arthritis, joint pain, trigger finger -she has arthritis at baseline, especially in her right knee. -we reviewed that her symptoms can be exacerbated by anastrozole. I advised her to contact us if it becomes intolerable, and we can switch her to a different antiestrogen.   2. Genetic Testing -Based on her family history of cancer, she is eligible for genetic testing.  -Her genetics was negative   3. Comorbidities: HTN, DM, Hypothyroidism -Well controlled on Medications. -Her weight is stable on anastrozole. Will continue to monitor. -Continue to f/u with PCP for management   4. Bone Health -Her 07/01/20 DEXA was normal (+1.1). Will repeat every 2 years on AI.     PLAN:  -continue anastrozole, she will call us if arthralgia gets worse -Lab and follow-up with NP Lacie in 6 months   No problem-specific Assessment & Plan notes found for this encounter.   SUMMARY OF ONCOLOGIC HISTORY: Oncology History Overview Note  Cancer Staging Malignant neoplasm of upper-outer quadrant of right breast in female, estrogen receptor positive ([place]) Staging form: Breast, AJCC 8th Edition - Clinical stage from 09/12/2019: Stage IA (cT1b, cN0, cM0, G1, ER+, PR+, HER2-) - Signed by Truitt Merle, MD on 09/23/2019    Malignant neoplasm of upper-outer quadrant of right breast in female, estrogen receptor positive ([place])  09/05/2019 Mammogram   IMPRESSION: Indeterminate 9 x 6 x 6 mm mass involving the OUTER RIGHT breast at the 9 o'clock position approximately 5 cm from the nipple at POSTERIOR depth, located anterior to a normal appearing intramammary lymph Node.      09/12/2019 Cancer Staging   Staging form: Breast, AJCC 8th Edition - Clinical stage from 09/12/2019: Stage IA (cT1b,  cN0, cM0, G1, ER+, PR+, HER2-) - Signed by Truitt Merle, MD on 09/23/2019    09/12/2019 Initial Biopsy   Diagnosis 1. Breast, right, needle core biopsy, 9 o'clock  posterior - INVASIVE DUCTAL CARCINOMA WITH PAPILLARY FEATURES. 2. Breast, right, needle core biopsy, 9 o'clock anterior - INVASIVE DUCTAL CARCINOMA WITH PAPILLARY FEATURES. Microscopic Comment 1. - 2. The specimens share similar morphologic features. E-cadherin is positive and CK5/6 is negative. P63, Calponin and SMM-1 demonstrate the absence of myoepithelium. Ancillary studies will be reported separately. Results reported to [facility] on 09/15/2019. Intradepartmental consultation (Dr. Tresa Moore).   09/12/2019 Receptors her2   1. PROGNOSTIC INDICATORS Results: IMMUNOHISTOCHEMICAL AND MORPHOMETRIC ANALYSIS PERFORMED MANUALLY The tumor cells are NEGATIVE for Her2 (1+). Estrogen Receptor: 99%, POSITIVE, STRONG STAINING INTENSITY Progesterone Receptor: 99%, POSITIVE, STRONG STAINING INTENSITY Proliferation Marker Ki67: 5%   09/18/2019 Initial Diagnosis   Malignant neoplasm of upper-outer quadrant of right breast in female, estrogen receptor positive ([place])   10/05/2019 Genetic Testing   Negative genetic testing:  No pathogenic variants detected on the [facility] Breast Cancer STAT Panel + Common Hereditary Cancers Panel. 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 The report date is 10/05/2019.  The Breast Cancer STAT Panel offered by [facility] includes sequencing and deletion/duplication analysis for the following 9 genes:  ATM, BRCA1, BRCA2, CDH1, CHEK2, PALB2, PTEN, STK11 and TP53. The Common Hereditary Cancers Panel offered by [facility] includes sequencing and/or deletion duplication testing of the following 48 genes: APC, ATM, AXIN2, BARD1, BMPR1A, BRCA1, BRCA2, BRIP1, CDH1, CDK4, CDKN2A (p14ARF), CDKN2A (p16INK4a), CHEK2, CTNNA1, DICER1, EPCAM (Deletion/duplication testing only), GREM1 (promoter region deletion/duplication testing only), KIT, MEN1, MLH1, MSH2, MSH3, MSH6, MUTYH, NBN, NF1, NTHL1, PALB2, PDGFRA, PMS2, POLD1, POLE, PTEN,  RAD50, RAD51C, RAD51D, RNF43, SDHB, SDHC, SDHD, SMAD4, SMARCA4. STK11, TP53, TSC1, TSC2, and VHL.  The following genes were evaluated for sequence changes only: SDHA and HOXB13 c.251G>A variant only.    10/14/2019 Pathology Results   RIGHT BREAST LUMPECTOMY WITH RADIOACTIVE SEED AND SENTINEL LYMPH NODE BIOPSY by Dr Barry Dienes    FINAL MICROSCOPIC DIAGNOSIS:   A. BREAST, RIGHT, LUMPECTOMY:  - Multifocal invasive ductal carcinoma with papillary features, grade 2,  spanning 0.9 cm and 0.5 cm.  - Intermediate grade ductal carcinoma in situ.  - Invasive carcinoma present at original inferior margin broadly, final  inferior margin (Part F) is negative.  - Margins are negative for in situ carcinoma.  - Biopsy site.  - See oncology table.   B. BREAST, RIGHT ADDITIONAL POSTERIOR MARGIN, EXCISION:  - Benign breast tissue.   C. BREAST, RIGHT ADDITIONAL LATERAL MARGIN, EXCISION:  - Benign breast tissue.   D. BREAST, RIGHT ADDITIONAL SUPERIOR MARGIN, EXCISION:  - Benign breast tissue.   E. BREAST, RIGHT ADDITIONAL MEDIAL MARGIN, EXCISION:  - Invasive ductal carcinoma with papillary features, grade 2, spanning  0.5 cm.  - Invasive carcinoma is present at the new medial margin broadly.   F. BREAST, RIGHT ADDITIONAL INFERIOR MARGIN, EXCISION:  - Benign breast tissue.   G. LYMPH NODE, RIGHT AXILLARY #1, SENTINEL, EXCISION:  - One of one lymph nodes negative for carcinoma (0/1).   H. LYMPH NODE, RIGHT AXILLARY #2, SENTINEL, EXCISION:  - One of one lymph nodes negative for carcinoma (0/1).   10/14/2019 Oncotype testing   Oncotype  Recurrence Score 0 with distant recurrence risk at 9 years of 3%.  There is less then 1% benefit of chemotherapy   10/14/2019 Cancer  Staging   Staging form: Breast, AJCC 8th Edition - Pathologic stage from 10/14/2019: Stage IA (pT1b, pN0, cM0, G2, ER+, PR+, HER2-, Oncotype DX score: 0) - Signed by Truitt Merle, MD on 01/29/2020    11/11/2019 Pathology Results   RIGHT  RE-EXCISION OF BREAST LUMPECTOMY by Dr Barry Dienes    FINAL MICROSCOPIC DIAGNOSIS:   A. BREAST, RIGHT, RE-EXCISION OF MEDIAL MARGIN:  - Prior procedure site changes.  - No carcinoma identified.   COMMENT:   P63, Calponin and SMM-1 demonstrate the presence of myoepithelium in the  select focus.    12/22/2019 - 01/19/2020 Radiation Therapy   Adjuvant Radiation with Dr Lisbeth Renshaw    05/06/2020 Survivorship   SCP delivered by Cira Rue, NP       INTERVAL HISTORY:  Michelle Sherman is here for a follow up of breast cancer. She was last seen by me on 08/06/20. She presents to the clinic alone. She reports continued joint pain; she notes she is unsure if this is related to her arthritis or the anastrozole.   All other systems were reviewed with the patient and are negative.  MEDICAL HISTORY:  Past Medical History:  Diagnosis Date   Allergy    Arthritis    Breast cancer ([place]) 08/2019   right breast IDC   Cataract    Complication of anesthesia    unable to void after surgery and had to stay overnight    Diabetes mellitus without complication ([place])    Family history of adverse reaction to anesthesia    sister, hard to wake up    Family history of lung cancer    Family history of pancreatic cancer    GERD (gastroesophageal reflux disease)    Hypertension    Sleep apnea    wears CPAP nightly   Thyroid disease    Ulcerative colitis ([place])     SURGICAL HISTORY: Past Surgical History:  Procedure Laterality Date   BREAST LUMPECTOMY WITH RADIOACTIVE SEED AND SENTINEL LYMPH NODE BIOPSY Right 10/14/2019   Procedure: RIGHT BREAST LUMPECTOMY WITH RADIOACTIVE SEED AND SENTINEL LYMPH NODE BIOPSY;  Surgeon: [name], MD;  Location: [place];  Service: General;  Laterality: Right;   CHOLECYSTECTOMY     RE-EXCISION OF BREAST LUMPECTOMY Right 11/11/2019   Procedure: RIGHT RE-EXCISION OF BREAST LUMPECTOMY;  Surgeon: [name], MD;  Location: [place];   Service: General;  Laterality: Right;  RNFA   SKIN SURGERY  12/2015   Fatty tumor removed   [place]    I have reviewed the social history and family history with the patient and they are unchanged from previous note.  ALLERGIES:  is allergic to nickel, sulfa antibiotics, and bydureon [exenatide].  MEDICATIONS:  Current Outpatient Medications  Medication Sig Dispense Refill   anastrozole (ARIMIDEX) 1 MG tablet Take 1 tablet (1 mg total) by mouth daily. 90 tablet 1   ASHWAGANDHA PO Take by mouth.     aspirin EC 81 MG tablet Take 1 tablet by mouth daily.     Biotin 2500 MCG CAPS Take 1 capsule by mouth daily.     Cholecalciferol (VITAMIN D3) 2000 units capsule Take 1 capsule by mouth daily.     dapagliflozin propanediol (FARXIGA) 10 MG TABS tablet Take 1 tablet (10 mg total) by mouth daily. 90 tablet 3   famotidine (PEPCID) 40 MG tablet Take 40 mg by mouth 2 (two) times daily.     levothyroxine (SYNTHROID) 112 MCG tablet Take  1 tablet (112 mcg total) by mouth daily. 90 tablet 3   melatonin 5 MG TABS Take 5 mg by mouth at bedtime as needed.     mesalamine (LIALDA) 1.2 g EC tablet Take by mouth. Take 2 tablet in am and 2 tablet in pm     metFORMIN (GLUCOPHAGE) 1000 MG tablet Take 1 tablet (1,000 mg total) by mouth 2 (two) times daily with a meal. 180 tablet 3   Misc Natural Products (TURMERIC CURCUMIN) CAPS Take 1 capsule by mouth daily.     Multiple Vitamin (MULTIVITAMIN) tablet Take 1 tablet by mouth daily.     ramipril (ALTACE) 10 MG capsule Take 1 capsule (10 mg total) by mouth daily. 90 capsule 3   No current facility-administered medications for this visit.    PHYSICAL EXAMINATION: ECOG PERFORMANCE STATUS: 1 - Symptomatic but completely ambulatory  Vitals:   12/08/20 1403  BP: (!) 125/59  Pulse: 79  Resp: 17  Temp: (!) 97.5 F (36.4 C)  SpO2: 97%   Wt Readings from Last 3 Encounters:  12/08/20 193 lb 1.6 oz (87.6 kg)  11/01/20 189 lb (85.7  kg)  10/27/20 190 lb 3.2 oz (86.3 kg)     GENERAL:alert, no distress and comfortable SKIN: skin color, texture, turgor are normal, no rashes or significant lesions EYES: normal, Conjunctiva are pink and non-injected, sclera clear  NECK: supple, thyroid normal size, non-tender, without nodularity LYMPH:  no palpable lymphadenopathy in the cervical, axillary  LUNGS: clear to auscultation and percussion with normal breathing effort HEART: regular rate & rhythm and no murmurs and no lower extremity edema ABDOMEN:abdomen soft, non-tender and normal bowel sounds Musculoskeletal:no cyanosis of digits and no clubbing  NEURO: alert & oriented x 3 with fluent speech, no focal motor/sensory deficits BREAST: No palpable mass, nodules or adenopathy bilaterally. Breast exam benign.   LABORATORY DATA:  I have reviewed the data as listed CBC Latest Ref Rng & Units 12/08/2020 10/13/2020 08/06/2020  WBC 4.0 - 10.5 K/uL 5.9 4.6 7.3  Hemoglobin 12.0 - 15.0 g/dL 14.1 12.9 14.2  Hematocrit 36.0 - 46.0 % 43.8 38.2 43.3  Platelets 150 - 400 K/uL 338 282.0 280     CMP Latest Ref Rng & Units 12/08/2020 10/27/2020 10/13/2020  Glucose 70 - 99 mg/dL 173(H) 113(H) 149(H)  BUN 8 - 23 mg/dL _0 Creatinine 0.44 - 1.00 mg/dL 0.75 0.55 0.58  Sodium 135 - 145 mmol/L 139 138 132(L)  Potassium 3.5 - 5.1 mmol/L 4.7 4.2 3.4(L)  Chloride 98 - 111 mmol/L 103 102 97  CO2 22 - 32 mmol/L _1 Calcium 8.9 - 10.3 mg/dL 10.3 10.1 10.0  Total Protein 6.5 - 8.1 g/dL 7.5 7.2 6.7  Total Bilirubin 0.3 - 1.2 mg/dL 0.3 0.8 1.8(H)  Alkaline Phos 38 - 126 U/L 86 184(H) 674(H)  AST 15 - 41 U/L 13(L) 23 195(H)  ALT 0 - 44 U/L 13 26 230(H)      RADIOGRAPHIC STUDIES: I have personally reviewed the radiological images as listed and agreed with the findings in the report. No results found.    No orders of the defined types were placed in this encounter.  All questions were answered. The patient knows to call the clinic  with any problems, questions or concerns. No barriers to learning was detected. The total time spent in the appointment was 30 minutes.     Truitt Merle, MD 12/08/2020   I, Wilburn Mylar, am acting as scribe for  Truitt Merle, MD.   I have reviewed the above documentation for accuracy and completeness, and I agree with the above.

## 2020-12-13 ENCOUNTER — Encounter: Payer: Self-pay | Admitting: Family Medicine

## 2020-12-13 ENCOUNTER — Other Ambulatory Visit: Payer: Self-pay

## 2020-12-13 MED ORDER — LEVOTHYROXINE SODIUM 112 MCG PO TABS: 112.0000 ug | ORAL_TABLET | Freq: Every day | ORAL | 3 refills | Status: DC

## 2020-12-20 DIAGNOSIS — H5213 Myopia, bilateral: Secondary | ICD-10-CM | POA: Diagnosis not present

## 2020-12-20 DIAGNOSIS — E119 Type 2 diabetes mellitus without complications: Secondary | ICD-10-CM | POA: Diagnosis not present

## 2020-12-20 DIAGNOSIS — H2513 Age-related nuclear cataract, bilateral: Secondary | ICD-10-CM | POA: Diagnosis not present

## 2020-12-20 LAB — HM DIABETES EYE EXAM

## 2021-01-07 ENCOUNTER — Ambulatory Visit (INDEPENDENT_AMBULATORY_CARE_PROVIDER_SITE_OTHER): Payer: Medicare Other | Admitting: Family Medicine

## 2021-01-07 ENCOUNTER — Encounter: Payer: Self-pay | Admitting: Family Medicine

## 2021-01-07 ENCOUNTER — Other Ambulatory Visit: Payer: Self-pay

## 2021-01-07 VITALS — BP 132/80 | HR 80 | Temp 98.0°F | Ht 61.0 in | Wt 191.6 lb

## 2021-01-07 DIAGNOSIS — Z6835 Body mass index (BMI) 35.0-35.9, adult: Secondary | ICD-10-CM

## 2021-01-07 DIAGNOSIS — E119 Type 2 diabetes mellitus without complications: Secondary | ICD-10-CM | POA: Diagnosis not present

## 2021-01-07 DIAGNOSIS — E039 Hypothyroidism, unspecified: Secondary | ICD-10-CM

## 2021-01-07 DIAGNOSIS — K716 Toxic liver disease with hepatitis, not elsewhere classified: Secondary | ICD-10-CM

## 2021-01-07 DIAGNOSIS — I152 Hypertension secondary to endocrine disorders: Secondary | ICD-10-CM | POA: Diagnosis not present

## 2021-01-07 DIAGNOSIS — E1169 Type 2 diabetes mellitus with other specified complication: Secondary | ICD-10-CM | POA: Diagnosis not present

## 2021-01-07 DIAGNOSIS — G720 Drug-induced myopathy: Secondary | ICD-10-CM

## 2021-01-07 DIAGNOSIS — T50905A Adverse effect of unspecified drugs, medicaments and biological substances, initial encounter: Secondary | ICD-10-CM | POA: Diagnosis not present

## 2021-01-07 DIAGNOSIS — G4709 Other insomnia: Secondary | ICD-10-CM | POA: Diagnosis not present

## 2021-01-07 DIAGNOSIS — T466X5A Adverse effect of antihyperlipidemic and antiarteriosclerotic drugs, initial encounter: Secondary | ICD-10-CM

## 2021-01-07 DIAGNOSIS — E782 Mixed hyperlipidemia: Secondary | ICD-10-CM | POA: Diagnosis not present

## 2021-01-07 DIAGNOSIS — Z0000 Encounter for general adult medical examination without abnormal findings: Secondary | ICD-10-CM

## 2021-01-07 DIAGNOSIS — M170 Bilateral primary osteoarthritis of knee: Secondary | ICD-10-CM

## 2021-01-07 DIAGNOSIS — E1159 Type 2 diabetes mellitus with other circulatory complications: Secondary | ICD-10-CM | POA: Diagnosis not present

## 2021-01-07 HISTORY — DX: Adverse effect of unspecified drugs, medicaments and biological substances, initial encounter: T50.905A

## 2021-01-07 LAB — COMPREHENSIVE METABOLIC PANEL
ALT: 13 U/L (ref 0–35)
AST: 11 U/L (ref 0–37)
Albumin: 4.2 g/dL (ref 3.5–5.2)
Alkaline Phosphatase: 81 U/L (ref 39–117)
BUN: 16 mg/dL (ref 6–23)
CO2: 30 mEq/L (ref 19–32)
Calcium: 10.1 mg/dL (ref 8.4–10.5)
Chloride: 103 mEq/L (ref 96–112)
Creatinine, Ser: 0.6 mg/dL (ref 0.40–1.20)
GFR: 90.32 mL/min (ref >60.00)
Glucose, Bld: 156 mg/dL — ABNORMAL HIGH (ref 70–99)
Potassium: 4.7 mEq/L (ref 3.5–5.1)
Sodium: 142 mEq/L (ref 135–145)
Total Bilirubin: 0.5 mg/dL (ref 0.2–1.2)
Total Protein: 7 g/dL (ref 6.0–8.3)

## 2021-01-07 LAB — CBC WITH DIFFERENTIAL/PLATELET
Basophils Absolute: 0 10*3/uL (ref 0.0–0.1)
Basophils Relative: 0.7 % (ref 0.0–3.0)
Eosinophils Absolute: 0.1 10*3/uL (ref 0.0–0.7)
Eosinophils Relative: 2.6 % (ref 0.0–5.0)
HCT: 43.3 % (ref 36.0–46.0)
Hemoglobin: 14.1 g/dL (ref 12.0–15.0)
Lymphocytes Relative: 24.6 % (ref 12.0–46.0)
Lymphs Abs: 1.3 10*3/uL (ref 0.7–4.0)
MCHC: 32.7 g/dL (ref 30.0–36.0)
MCV: 89.4 fl (ref 78.0–100.0)
Monocytes Absolute: 0.3 10*3/uL (ref 0.1–1.0)
Monocytes Relative: 6.5 % (ref 3.0–12.0)
Neutro Abs: 3.4 10*3/uL (ref 1.4–7.7)
Neutrophils Relative %: 65.6 % (ref 43.0–77.0)
Platelets: 312 10*3/uL (ref 150.0–400.0)
RBC: 4.84 Mil/uL (ref 3.87–5.11)
RDW: 14 % (ref 11.5–15.5)
WBC: 5.2 10*3/uL (ref 4.0–10.5)

## 2021-01-07 LAB — LIPID PANEL
Cholesterol: 213 mg/dL — ABNORMAL HIGH (ref 0–200)
HDL: 69.6 mg/dL (ref >39.00)
LDL Cholesterol: 107 mg/dL — ABNORMAL HIGH (ref 0–99)
NonHDL: 143.41
Total CHOL/HDL Ratio: 3
Triglycerides: 182 mg/dL — ABNORMAL HIGH (ref 0.0–149.0)
VLDL: 36.4 mg/dL (ref 0.0–40.0)

## 2021-01-07 LAB — POCT GLYCOSYLATED HEMOGLOBIN (HGB A1C): Hemoglobin A1C: 7 % — AB (ref 4.0–5.6)

## 2021-01-07 LAB — TSH: TSH: 1.8 u[IU]/mL (ref 0.35–5.50)

## 2021-01-07 LAB — CK: Total CK: 284 U/L — ABNORMAL HIGH (ref 7–177)

## 2021-01-07 MED ORDER — TRAZODONE HCL 50 MG PO TABS: 25.0000 mg | ORAL_TABLET | Freq: Every evening | ORAL | 3 refills | Status: DC | PRN

## 2021-01-07 MED ORDER — EZETIMIBE 10 MG PO TABS: 10.0000 mg | ORAL_TABLET | Freq: Every day | ORAL | 3 refills | Status: DC

## 2021-01-07 NOTE — Progress Notes (Signed)
Subjective  Chief Complaint  Patient presents with   Diabetes   Hypertension   Annual Exam    Fasting   Hypothyroidism    HPI: Michelle Sherman is a 70 y.o. female who presents to [facility] today for a Female Wellness Visit. She also has the concerns and/or needs as listed above in the chief complaint. These will be addressed in addition to the Health Maintenance Visit.   Wellness Visit: annual visit with health maintenance review and exam without Pap  Health maintenance: Screens are current and within normal limits.  Immunizations are up-to-date.  She is eligible for COVID booster vaccination.  Overall, she is doing well.  Continues to live an active lifestyle.  Of note, her brother did pass in October from pancreatic cancer.  She is coping well and appropriately. Chronic disease f/u and/or acute problem visit: (deemed necessary to be done in addition to the wellness visit): Diabetes: Continues on Farxiga and metformin.  Tolerating well.  Reports diet has been fair.  Weight is stable.  No recent exercise.  Eye exams up-to-date.  No paresthesias. Hyperlipidemia: Fasting for recheck today.  Lipitor was stopped in September due to statin myopathy and drug-induced hepatitis.  She is no longer having myalgias.  No malaise nausea or fevers.  Last hepatic panel had normalized after stopping Lipitor. Hypothyroidism on levothyroxine daily.  Energy levels are mostly good.  No significant symptoms of high or low thyroid.  She does admit to daytime fatigue.  She keeps very busy. Poor sleep and fatigue: Has never been a great sleeper.  Does wear CPAP for sleep apnea.  Awakens 2-3 times to use the bathroom.  But does have a difficult time staying asleep.  Has used melatonin ashwagandha which helps to initiate sleep but not to maintain it.  She intermittently feels refreshed upon waking.  Will doze off while watching TV in the early evening hours. Known bilateral osteoarthritis  knees: Has intermittent flares.  Tylenol, last year flex and Voltaren gel tend to help.  No redness warmth or swelling at this time.  Assessment  1. Annual physical exam   2. Controlled type 2 diabetes mellitus without complication, without long-term current use of insulin (HCC)   3. Statin myopathy   4. Hypertension associated with diabetes ([place])   5. Combined hyperlipidemia associated with type 2 diabetes mellitus ([place])   6. Acquired hypothyroidism   7. Drug-induced hepatitis   8. Severe obesity (BMI 35.0-35.9 with comorbidity) ([place])   9. Primary osteoarthritis of both knees   10. Secondary insomnia      Plan  Female Wellness Visit: Age appropriate Health Maintenance and Prevention measures were discussed with patient. Included topics are cancer screening recommendations, ways to keep healthy (see AVS) including dietary and exercise recommendations, regular eye and dental care, use of seat belts, and avoidance of moderate alcohol use and tobacco use.  Screens are current BMI: discussed patient's BMI and encouraged positive lifestyle modifications to help get to or maintain a target BMI. HM needs and immunizations were addressed and ordered. See below for orders. See HM and immunization section for updates. Routine labs and screening tests ordered including cmp, cbc and lipids where appropriate. Discussed recommendations regarding Vit D and calcium supplementation (see AVS)  Chronic disease management visit and/or acute problem visit: Diabetic control is improved.  Near goal.  Continue [place] and metformin.  Work harder on diet and exercise and recheck in 3 months.  Hoping to avoid  adding a third medication but could add GLP-1. Hypertension is well controlled on ramipril 10 mg daily.  Check renal function and electrolytes. Hyperlipidemia with statin myopathy history: No longer candidate for statins.  Start Zetia.  Education given.  Low-cholesterol diet recommended. Recheck thyroid  levels.  Clinically euthyroid.  Taking levothyroxine 112 mcg daily. Knee osteoarthritis: Ice if swollen or painful.  Recommend Voltaren gel along with Tylenol for flares.  For daily prevention can use osteoflex and tylenol. Insomnia: Secondary, sleep apnea, nocturia, and stressors.  Education counseling given.  Trial of trazodone.  Follow up: 3 months recheck diabetes Orders Placed This Encounter  Procedures   CBC with Differential/Platelet   Comprehensive metabolic panel   CK   Lipid panel   TSH   POCT HgB A1C   Meds ordered this encounter  Medications   ezetimibe (ZETIA) 10 MG tablet    Sig: Take 1 tablet (10 mg total) by mouth daily.    Dispense:  90 tablet    Refill:  3   traZODone (DESYREL) 50 MG tablet    Sig: Take 0.5-1 tablets (25-50 mg total) by mouth at bedtime as needed for sleep.    Dispense:  30 tablet    Refill:  3      Body mass index is 36.2 kg/m. Wt Readings from Last 3 Encounters:  01/07/21 191 lb 9.6 oz (86.9 kg)  12/08/20 193 lb 1.6 oz (87.6 kg)  11/01/20 189 lb (85.7 kg)     Patient Active Problem List   Diagnosis Date Noted   Drug-induced hepatitis - lipitor 2022 01/07/2021    Priority: High    Statin induced 2022, lipitor. lfts in 100s, resolved with cessation of lipitor. Normal liver ultrasound    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    Priority: High    Sept 2022; elevated LFTs and ck    Malignant neoplasm of upper-outer quadrant of right breast in female, estrogen receptor positive ([place]) 09/18/2019    Priority: High   Controlled type 2 diabetes mellitus without complication, without long-term current use of insulin ([place]) 11/01/2016    Priority: High    Overview:  Declined nutrition consult - due to lack of insurance coverage for this service 01/2013    Hypertension associated with diabetes ([place]) 11/01/2016    Priority: High   Severe obesity (BMI 35.0-35.9 with comorbidity) ([place]) 11/01/2016    Priority: High   Combined hyperlipidemia associated  with type 2 diabetes mellitus ([place]) 01/15/2012    Priority: High   Ulcerative colitis without complications ([place]) 16/10/9602    Priority: High    Overview:  No NSAIDS    Acquired hypothyroidism 10/04/2010    Priority: High   OSA on CPAP 06/28/2010    Priority: High    NPSG 06/04/06- AHI ? 23/ hr, desaturation to 66%, CPAP titrated to 8, body weight 242 lbs. HST 02/05/17-AHI 23.4/hour, desaturation to 86%, body weight 192 pounds    Diverticulosis of colon 10/17/2010    Priority: Medium    Osteoarthrosis of knee 10/04/2010    Priority: Medium     Discussed synvisc 12/2017; has had PT. Had imaging - mild to moderate OA 2019    Genetic testing 10/06/2019    Priority: Low    Negative genetic testing:  No pathogenic variants detected on the [facility] Breast Cancer STAT Panel + Common Hereditary Cancers Panel. 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 The report date is 10/05/2019.  The Breast Cancer STAT Panel  offered by [facility] includes sequencing and deletion/duplication analysis for the following 9 genes:  ATM, BRCA1, BRCA2, CDH1, CHEK2, PALB2, PTEN, STK11 and TP53. The Common Hereditary Cancers Panel offered by [facility] includes sequencing and/or deletion duplication testing of the following 48 genes: APC, ATM, AXIN2, BARD1, BMPR1A, BRCA1, BRCA2, BRIP1, CDH1, CDK4, CDKN2A (p14ARF), CDKN2A (p16INK4a), CHEK2, CTNNA1, DICER1, EPCAM (Deletion/duplication testing only), GREM1 (promoter region deletion/duplication testing only), KIT, MEN1, MLH1, MSH2, MSH3, MSH6, MUTYH, NBN, NF1, NTHL1, PALB2, PDGFRA, PMS2, POLD1, POLE, PTEN, RAD50, RAD51C, RAD51D, RNF43, SDHB, SDHC, SDHD, SMAD4, SMARCA4. STK11, TP53, TSC1, TSC2, and VHL.  The following genes were evaluated for sequence changes only: SDHA and HOXB13 c.251G>A variant only.     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    Priority: Low   Allergic rhinitis 10/05/2011    Priority: Low   Internal hemorrhoids  10/17/2010    Priority: Low   Nonspecific reactive hepatitis 10/28/2020    Due to lipitor    Family history of pancreatic cancer    Family history of lung cancer    Incomplete uterovaginal prolapse 04/03/2019   Health Maintenance  Topic Date Due   COVID-19 Vaccine (3 - Moderna risk series) 05/13/2020   HEMOGLOBIN A1C  07/08/2021   FOOT EXAM  07/15/2021   MAMMOGRAM  09/01/2021   OPHTHALMOLOGY EXAM  12/07/2021   DEXA SCAN  07/02/2023   COLONOSCOPY (Pts 45-18yr Insurance coverage will need to be confirmed)  04/01/2026   Pneumonia Vaccine 70 Years old  Completed   INFLUENZA VACCINE  Completed   Hepatitis C Screening  Completed   Zoster Vaccines- Shingrix  Completed   HPV VACCINES  Aged Out   TETANUS/TDAP  Discontinued   Immunization History  Administered Date(s) Administered   Fluad Quad(high Dose 65+) 12/15/2019, 10/27/2020   Influenza, High Dose Seasonal PF 12/06/2015, 11/01/2016, 09/21/2017, 10/26/2018   Moderna SARS-COV2 Booster Vaccination 04/15/2020, 04/15/2020   Moderna Sars-Covid-2 Vaccination 04/24/2019, 05/21/2019   Pneumococcal Conjugate-13 10/22/2014   Pneumococcal Polysaccharide-23 10/05/2011, 12/21/2016   Tdap 10/04/2010   Zoster Recombinat (Shingrix) 01/02/2019, 03/25/2019   Zoster, Live 10/03/2009   We updated and reviewed the patient's past history in detail and it is documented below. Allergies: Patient is allergic to nickel, sulfa antibiotics, lipitor [atorvastatin], and bydureon [exenatide]. Past Medical History Patient  has a past medical history of Allergy, Arthritis, Breast cancer ([place] (08/[place]), Cataract, Complication of anesthesia, Diabetes mellitus without complication ([place], Family history of adverse reaction to anesthesia, Family history of lung cancer, Family history of pancreatic cancer, GERD (gastroesophageal reflux disease), Hypertension, Sleep apnea, Thyroid disease, and Ulcerative colitis ([place]. Past Surgical History Patient  has a past  surgical history that includes Skin surgery (12/2015); Cholecystectomy; Tonsillectomy and adenoidectomy (1956); Breast lumpectomy with radioactive seed and sentinel lymph node biopsy (Right, 10/14/2019); and Re-excision of breast lumpectomy (Right, 11/11/2019). Family History: Patient family history includes Cancer in her brother and paternal aunt; Crohn's disease in her brother; Diabetes in her brother, brother, maternal aunt, maternal uncle, and mother; Heart attack in her paternal grandfather; Heart disease in her father; Lung cancer in her paternal aunt; Lung cancer (age of onset: 562 in her father; Other in her sister; Pancreatic cancer (age of onset: 742 in her brother; Ulcerative colitis in her brother and mother; Varicose Veins in her sister. Social History:  Patient  reports that she has never smoked. She has never used smokeless tobacco. She reports current alcohol use. She reports that she does not use drugs.  Review of Systems: Constitutional: negative for fever  or malaise Ophthalmic: negative for photophobia, double vision or loss of vision Cardiovascular: negative for chest pain, dyspnea on exertion, or new LE swelling Respiratory: negative for SOB or persistent cough Gastrointestinal: negative for abdominal pain, change in bowel habits or melena Genitourinary: negative for dysuria or gross hematuria, no abnormal uterine bleeding or disharge Musculoskeletal: negative for new gait disturbance or muscular weakness Integumentary: negative for new or persistent rashes, no breast lumps Neurological: negative for TIA or stroke symptoms Psychiatric: negative for SI or delusions Allergic/Immunologic: negative for hives  Patient Care Team    Relationship Specialty Notifications Start End  Leamon Arnt, MD PCP - General Family Medicine  08/04/16   Deneise Lever, MD Consulting Physician Pulmonary Disease  01/29/17   Juanita Craver, MD Consulting Physician Gastroenterology  03/21/17    Lyndee Hensen, PT Physical Therapist Physical Therapy  12/04/17   Trula Slade, DPM Consulting Physician Podiatry  03/27/18   Katy Apo, MD Consulting Physician Ophthalmology  04/03/19   Rockwell Germany, RN Oncology Nurse Navigator   09/18/19   Mauro Kaufmann, RN Oncology Nurse Navigator   09/18/19   [name], MD Consulting Physician General Surgery  09/22/19   Truitt Merle, MD Consulting Physician Hematology  09/22/19   Kyung Rudd, MD Consulting Physician Radiation Oncology  09/22/19   Alla Feeling, NP Nurse Practitioner Nurse Practitioner  05/06/20   Madelin Rear, [facility] Pharmacist Pharmacist  06/02/20    Comment: Phone [phone]    Objective  Vitals: BP 132/80    Pulse 80    Temp 98 F (36.7 C) (Temporal)    Ht 5' 1"  (1.549 m)    Wt 191 lb 9.6 oz (86.9 kg)    SpO2 98%    BMI 36.20 kg/m  General:  Well developed, well nourished, no acute distress  Psych:  Alert and orientedx3,normal mood and affect HEENT:  Normocephalic, atraumatic, non-icteric sclera,  supple neck without adenopathy, mass or thyromegaly Cardiovascular:  Normal S1, S2, RRR without gallop, rub or murmur Respiratory:  Good breath sounds bilaterally, CTAB with normal respiratory effort Gastrointestinal: normal bowel sounds, soft, non-tender, no noted masses. No HSM MSK: no deformities, contusions. Joints are without erythema or swelling.  Skin:  Warm, no rashes or suspicious lesions noted  Commons side effects, risks, benefits, and alternatives for medications and treatment plan prescribed today were discussed, and the patient expressed understanding of the given instructions. Patient is instructed to call or message via MyChart if he/she has any questions or concerns regarding our treatment plan. No barriers to understanding were identified. We discussed Red Flag symptoms and signs in detail. Patient expressed understanding regarding what to do in case of urgent or emergency type symptoms.  Medication list was  reconciled, printed and provided to the patient in AVS. Patient instructions and summary information was reviewed with the patient as documented in the AVS. This note was prepared with assistance of Dragon voice recognition software. Occasional wrong-word or sound-a-like substitutions may have occurred due to the inherent limitations of voice recognition software  This visit occurred during the SARS-CoV-2 public health emergency.  Safety protocols were in place, including screening questions prior to the visit, additional usage of staff PPE, and extensive cleaning of exam room while observing appropriate contact time as indicated for disinfecting solutions.

## 2021-01-07 NOTE — Patient Instructions (Addendum)
Please return in 3 months for diabetes follow up   I will release your lab results to you on your MyChart account with further instructions. Please reply with any questions.    Please start the new cholesterol lowering medication called zetia at night.  You may try the trazodone to see if we can get you sleeping better.   Work on [employer] and exercise to help lower your sugars.   If you have any questions or concerns, please don't hesitate to send me a message via MyChart or call the office at [phone]. Thank you for visiting with [place] today! It's our pleasure caring for you.

## 2021-01-09 ENCOUNTER — Encounter: Payer: Self-pay | Admitting: Family Medicine

## 2021-01-10 ENCOUNTER — Other Ambulatory Visit: Payer: Self-pay

## 2021-01-10 MED ORDER — DAPAGLIFLOZIN PROPANEDIOL 10 MG PO TABS: 10.0000 mg | ORAL_TABLET | Freq: Every day | ORAL | 3 refills | Status: DC

## 2021-01-24 ENCOUNTER — Encounter: Payer: Self-pay | Admitting: Internal Medicine

## 2021-01-24 NOTE — Assessment & Plan Note (Signed)
Benefits from CPAP with good compliance and control. Plan-continue auto 5-15

## 2021-01-24 NOTE — Assessment & Plan Note (Signed)
Controlled with Synthroid.  No symptom overlap with OSA.

## 2021-01-25 ENCOUNTER — Ambulatory Visit: Payer: Medicare Other

## 2021-01-27 ENCOUNTER — Encounter: Payer: Self-pay | Admitting: Internal Medicine

## 2021-01-27 ENCOUNTER — Ambulatory Visit (INDEPENDENT_AMBULATORY_CARE_PROVIDER_SITE_OTHER): Payer: Medicare Other | Admitting: Internal Medicine

## 2021-01-27 ENCOUNTER — Other Ambulatory Visit: Payer: Self-pay

## 2021-01-27 VITALS — BP 132/74 | HR 78 | Temp 97.6°F | Ht 59.41 in | Wt 193.8 lb

## 2021-01-27 DIAGNOSIS — E1159 Type 2 diabetes mellitus with other circulatory complications: Secondary | ICD-10-CM | POA: Diagnosis not present

## 2021-01-27 DIAGNOSIS — Z170 Estrogen receptor positive status [ER+]: Secondary | ICD-10-CM | POA: Diagnosis not present

## 2021-01-27 DIAGNOSIS — E039 Hypothyroidism, unspecified: Secondary | ICD-10-CM

## 2021-01-27 DIAGNOSIS — K719 Toxic liver disease, unspecified: Secondary | ICD-10-CM

## 2021-01-27 DIAGNOSIS — M19049 Primary osteoarthritis, unspecified hand: Secondary | ICD-10-CM | POA: Diagnosis not present

## 2021-01-27 DIAGNOSIS — I152 Hypertension secondary to endocrine disorders: Secondary | ICD-10-CM | POA: Diagnosis not present

## 2021-01-27 DIAGNOSIS — R002 Palpitations: Secondary | ICD-10-CM

## 2021-01-27 DIAGNOSIS — T466X5A Adverse effect of antihyperlipidemic and antiarteriosclerotic drugs, initial encounter: Secondary | ICD-10-CM | POA: Diagnosis not present

## 2021-01-27 DIAGNOSIS — M6530 Trigger finger, unspecified finger: Secondary | ICD-10-CM | POA: Diagnosis not present

## 2021-01-27 DIAGNOSIS — C50411 Malignant neoplasm of upper-outer quadrant of right female breast: Secondary | ICD-10-CM

## 2021-01-27 DIAGNOSIS — Z23 Encounter for immunization: Secondary | ICD-10-CM

## 2021-01-27 HISTORY — DX: Palpitations: R00.2

## 2021-01-27 HISTORY — DX: Adverse effect of antihyperlipidemic and antiarteriosclerotic drugs, initial encounter: T46.6X5A

## 2021-01-27 HISTORY — DX: Adverse effect of antihyperlipidemic and antiarteriosclerotic drugs, initial encounter: K71.9

## 2021-01-27 MED ORDER — EZETIMIBE 10 MG PO TABS: 10.0000 mg | ORAL_TABLET | Freq: Every day | ORAL | 3 refills | Status: DC

## 2021-01-27 MED ORDER — TETANUS-DIPHTH-ACELL PERTUSSIS 5-2.5-18.5 LF-MCG/0.5 IM SUSP: 0.5000 mL | Freq: Once | INTRAMUSCULAR | 0 refills | Status: AC

## 2021-01-27 MED ORDER — TRAZODONE HCL 50 MG PO TABS: 25.0000 mg | ORAL_TABLET | Freq: Every evening | ORAL | 3 refills | Status: DC | PRN

## 2021-01-27 NOTE — Patient Instructions (Addendum)
Consider cardiology let me know monitor caffeine intake   Palpitations Palpitations are feelings that your heartbeat is irregular or is faster than normal. It may feel like your heart is fluttering or skipping a beat. Palpitations may be caused by many things, including smoking, caffeine, alcohol, stress, and certain medicines or drugs. Most causes of palpitations are not serious.  However, some palpitations can be a sign of a serious problem. Further tests and a thorough medical history will be done to find the cause of your palpitations. Your provider may order tests such as an ECG, labs, an echocardiogram, or an ambulatory continuous ECG monitor. Follow these instructions at home: Pay attention to any changes in your symptoms. Let your health care provider know about them. Take these actions to help manage your symptoms: Eating and drinking Follow instructions from your health care provider about eating or drinking restrictions. You may need to avoid foods and drinks that may cause palpitations. These may include: Caffeinated coffee, tea, soft drinks, and energy drinks. Chocolate. Alcohol. Diet pills. Lifestyle   Take steps to reduce your stress and anxiety. Things that can help you relax include: Yoga. Mind-body activities, such as deep breathing, meditation, or using words and images to create positive thoughts (guided imagery). Physical activity, such as swimming, jogging, or walking. Tell your health care provider if your palpitations increase with activity. If you have chest pain or shortness of breath with activity, do not continue the activity until you are seen by your health care provider. Biofeedback. This is a method that helps you learn to use your mind to control things in your body, such as your heartbeat. Get plenty of rest and sleep. Keep a regular bed time. Do not use drugs, including cocaine or ecstasy. Do not use marijuana. Do not use any products that contain nicotine or  tobacco. These products include cigarettes, chewing tobacco, and vaping devices, such as e-cigarettes. If you need help quitting, ask your health care provider. General instructions Take over-the-counter and prescription medicines only as told by your health care provider. Keep all follow-up visits. This is important. These may include visits for further testing if palpitations do not go away or get worse. Contact a health care provider if: You continue to have a fast or irregular heartbeat for a long period of time. You notice that your palpitations occur more often. Get help right away if: You have chest pain or shortness of breath. You have a severe headache. You feel dizzy or you faint. These symptoms may represent a serious problem that is an emergency. Do not wait to see if the symptoms will go away. Get medical help right away. Call your local emergency services (911 in the [place] Do not drive yourself to the hospital. Summary Palpitations are feelings that your heartbeat is irregular or is faster than normal. It may feel like your heart is fluttering or skipping a beat. Palpitations may be caused by many things, including smoking, caffeine, alcohol, stress, certain medicines, and drugs. Further tests and a thorough medical history may be done to find the cause of your palpitations. Get help right away if you faint or have chest pain, shortness of breath, severe headache, or dizziness. This information is not intended to replace advice given to you by your health care provider. Make sure you discuss any questions you have with your health care provider. Document Revised: 06/02/2020 Document Reviewed: 06/02/2020 Elsevier Patient Education  2022 [employer].  Ezetimibe Tablets What is this medication? EZETIMIBE (ez  ET i mibe) treats high cholesterol. It works by reducing the amount of cholesterol absorbed from the food you eat. This decreases the amount of bad cholesterol (such as LDL)  in your blood. Changes to diet and exercise are often combined with this medication. This medicine may be used for other purposes; ask your health care provider or pharmacist if you have questions. COMMON BRAND NAME(S): Zetia What should I tell my care team before I take this medication? They need to know if you have any of these conditions: Kidney disease Liver disease Muscle cramps, pain Muscle injury Thyroid disease An unusual or allergic reaction to ezetimibe, other medications, foods, dyes, or preservatives Pregnant or trying to get pregnant Breast-feeding How should I use this medication? Take this medication by mouth. Take it as directed on the prescription label at the same time every day. You can take it with or without food. If it upsets your stomach, take it with food. Keep taking it unless your care team tells you to stop. Take bile acid sequestrants at a different time of day than this medication. Take this medication 2 hours BEFORE or 4 hours AFTER bile acid sequestrants. Talk to your care team about the use of this medication in children. While it may be prescribed for children as young as 10 for selected conditions, precautions do apply. Overdosage: If you think you have taken too much of this medicine contact a poison control center or emergency room at once. NOTE: This medicine is only for you. Do not share this medicine with others. What if I miss a dose? If you miss a dose, take it as soon as you can. If it is almost time for your next dose, take only that dose. Do not take double or extra doses. What may interact with this medication? Do not take this medication with any of the following: Fenofibrate Gemfibrozil This medication may also interact with the following: Antacids Cyclosporine Herbal medications like red yeast rice Other medications to lower cholesterol or triglycerides This list may not describe all possible interactions. Give your health care provider a  list of all the medicines, herbs, non-prescription drugs, or dietary supplements you use. Also tell them if you smoke, drink alcohol, or use illegal drugs. Some items may interact with your medicine. What should I watch for while using this medication? Visit your care team for regular checks on your progress. Tell your care team if your symptoms do not start to get better or if they get worse. Your care team may tell you to stop taking this medication if you develop muscle problems. If your muscle problems do not go away after stopping this medication, contact your care team. Do not become pregnant while taking this medication. Women should inform their care team if they wish to become pregnant or think they might be pregnant. There is potential for serious harm to an unborn child. Talk to your care team for more information. Do not breast-feed an infant while taking this medication. Taking this medication is only part of a total heart healthy program. Your care team may give you a special diet to follow. Avoid alcohol. Avoid smoking. Ask your care team how much you should exercise. What side effects may I notice from receiving this medication? Side effects that you should report to your doctor or health care provider as soon as possible: Allergic reactions--skin rash, itching or hives, swelling of the face, lips, tongue, or throat Side effects that usually do not require medical  attention (report to your doctor or health care provider if they continue or are bothersome): Diarrhea Joint pain This list may not describe all possible side effects. Call your doctor for medical advice about side effects. You may report side effects to FDA at 1-800-FDA-1088. Where should I keep my medication? Keep out of the reach of children and pets. Store at room temperature between 15 and 30 degrees C (59 and 86 degrees F). Protect from moisture. Get rid of any unused medication after the expiration date. NOTE: This  sheet is a summary. It may not cover all possible information. If you have questions about this medicine, talk to your doctor, pharmacist, or health care provider.  2022 Elsevier/Gold Standard (2020-02-06 00:00:00)  Trazodone Tablets What is this medication? TRAZODONE (TRAZ oh done) treats depression. It increases the amount of serotonin in the brain, a hormone that helps regulate mood. This medicine may be used for other purposes; ask your health care provider or pharmacist if you have questions. COMMON BRAND NAME(S): Desyrel What should I tell my care team before I take this medication? They need to know if you have any of these conditions: Attempted suicide or thinking about it Bipolar disorder Bleeding problems Glaucoma Heart disease, or previous heart attack Irregular heart beat Kidney or liver disease Low levels of sodium in the blood An unusual or allergic reaction to trazodone, other medications, foods, dyes or preservatives Pregnant or trying to get pregnant Breast-feeding How should I use this medication? Take this medication by mouth with a glass of water. Follow the directions on the prescription label. Take this medication shortly after a meal or a light snack. Take your medication at regular intervals. Do not take your medication more often than directed. Do not stop taking this medication suddenly except upon the advice of your care team. Stopping this medication too quickly may cause serious side effects or your condition may worsen. A special MedGuide will be given to you by the pharmacist with each prescription and refill. Be sure to read this information carefully each time. Talk to your care team regarding the use of this medication in children. Special care may be needed. Overdosage: If you think you have taken too much of this medicine contact a poison control center or emergency room at once. NOTE: This medicine is only for you. Do not share this medicine with  others. What if I miss a dose? If you miss a dose, take it as soon as you can. If it is almost time for your next dose, take only that dose. Do not take double or extra doses. What may interact with this medication? Do not take this medication with any of the following: Certain medications for fungal infections like fluconazole, itraconazole, ketoconazole, posaconazole, voriconazole Cisapride Dronedarone Linezolid MAOIs like Carbex, Eldepryl, Marplan, Nardil, and Parnate Mesoridazine Methylene blue (injected into a vein) Pimozide Saquinavir Thioridazine This medication may also interact with the following: Alcohol Antiviral medications for HIV or AIDS Aspirin and aspirin-like medications Barbiturates like phenobarbital Certain medications for blood pressure, heart disease, irregular heart beat Certain medications for depression, anxiety, or psychotic disturbances Certain medications for migraine headache like almotriptan, eletriptan, frovatriptan, naratriptan, rizatriptan, sumatriptan, zolmitriptan Certain medications for seizures like carbamazepine and phenytoin Certain medications for sleep Certain medications that treat or prevent blood clots like dalteparin, enoxaparin, warfarin Digoxin Fentanyl Lithium NSAIDS, medications for pain and inflammation, like ibuprofen or naproxen Other medications that prolong the QT interval (cause an abnormal heart rhythm) like dofetilide Rasagiline Supplements  like St. John's wort, kava kava, valerian Tramadol Tryptophan This list may not describe all possible interactions. Give your health care provider a list of all the medicines, herbs, non-prescription drugs, or dietary supplements you use. Also tell them if you smoke, drink alcohol, or use illegal drugs. Some items may interact with your medicine. What should I watch for while using this medication? Tell your care team if your symptoms do not get better or if they get worse. Visit your  care team for regular checks on your progress. Because it may take several weeks to see the full effects of this medication, it is important to continue your treatment as prescribed by your care team. Watch for new or worsening thoughts of suicide or depression. This includes sudden changes in mood, behaviors, or thoughts. These changes can happen at any time but are more common in the beginning of treatment or after a change in dose. Call your care team right away if you experience these thoughts or worsening depression. Manic episodes may happen in patients with bipolar disorder who take this medication. Watch for changes in feelings or behaviors such as feeling anxious, nervous, agitated, panicky, irritable, hostile, aggressive, impulsive, severely restless, overly excited and hyperactive, or trouble sleeping. These changes can happen at any time but are more common in the beginning of treatment or after a change in dose. Call your care team right away if you notice any of these symptoms. You may get drowsy or dizzy. Do not drive, use machinery, or do anything that needs mental alertness until you know how this medication affects you. Do not stand or sit up quickly, especially if you are an older patient. This reduces the risk of dizzy or fainting spells. Alcohol may interfere with the effect of this medication. Avoid alcoholic drinks. This medication may cause dry eyes and blurred vision. If you wear contact lenses you may feel some discomfort. Lubricating drops may help. See your eye doctor if the problem does not go away or is severe. Your mouth may get dry. Chewing sugarless gum, sucking hard candy and drinking plenty of water may help. Contact your care team if the problem does not go away or is severe. What side effects may I notice from receiving this medication? Side effects that you should report to your care team as soon as possible: Allergic reactions--skin rash, itching, hives, swelling of the  face, lips, tongue, or throat Bleeding--bloody or black, tar-like stools, red or dark brown urine, vomiting blood or brown material that looks like coffee grounds, small, red or purple spots on skin, unusual bleeding or bruising Heart rhythm changes--fast or irregular heartbeat, dizziness, feeling faint or lightheaded, chest pain, trouble breathing Low blood pressure--dizziness, feeling faint or lightheaded, blurry vision Low sodium level--muscle weakness, fatigue, dizziness, headache, confusion Prolonged or painful erection Serotonin syndrome--irritability, confusion, fast or irregular heartbeat, muscle stiffness, twitching muscles, sweating, high fever, seizures, chills, vomiting, diarrhea Sudden eye pain or change in vision such as blurry vision, seeing halos around lights, vision loss Thoughts of suicide or self-harm, worsening mood, feelings of depression Side effects that usually do not require medical attention (report to your care team if they continue or are bothersome): Change in sex drive or performance Constipation Dizziness Drowsiness Dry mouth This list may not describe all possible side effects. Call your doctor for medical advice about side effects. You may report side effects to FDA at 1-800-FDA-1088. Where should I keep my medication? Keep out of the reach of children and  pets. Store at room temperature between 15 and 30 degrees C (59 to 86 degrees F). Protect from light. Keep container tightly closed. Throw away any unused medication after the expiration date. NOTE: This sheet is a summary. It may not cover all possible information. If you have questions about this medicine, talk to your doctor, pharmacist, or health care provider.  2022 Elsevier/Gold Standard (2019-12-31 00:00:00)

## 2021-01-27 NOTE — Progress Notes (Signed)
Chief Complaint  Patient presents with   Establish Care   F/u  1. Dm 2 farxiga 10 mg qd metformin 1000 mg on altace 10 mg zetia 10 mg qd  Statin caused elevated ck and lfts 2. Hypothyroidism on levo 112 mcg qd  3. C/o trigger fingers b/l hands and arthritis and in am hands lock up  4. Palpitations x 1-2 years worse at night drinks 2 cupps coffee in the am and at times tea and soda  Review of Systems  Constitutional:  Negative for weight loss.  HENT:  Negative for hearing loss.   Eyes:  Negative for blurred vision.  Respiratory:  Negative for shortness of breath.   Cardiovascular:  Negative for chest pain.  Gastrointestinal:  Negative for abdominal pain and blood in stool.  Genitourinary:  Negative for dysuria.  Musculoskeletal:  Negative for falls and joint pain.  Skin:  Negative for rash.  Neurological:  Negative for headaches.  Psychiatric/Behavioral:  Negative for depression.   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   Cataract    Complication of anesthesia    unable to void after surgery and had to stay overnight    Diabetes mellitus without complication ([place])    Family history of adverse reaction to anesthesia    sister, hard to wake up    Family history of lung cancer    Family history of pancreatic cancer    GERD (gastroesophageal reflux disease)    Hypertension    Sleep apnea    wears CPAP nightly   Thyroid disease    Ulcerative colitis ([place])    Past Surgical History:  Procedure Laterality Date   BREAST LUMPECTOMY WITH RADIOACTIVE SEED AND SENTINEL LYMPH NODE BIOPSY Right 10/14/2019   Procedure: RIGHT BREAST LUMPECTOMY WITH RADIOACTIVE SEED AND SENTINEL LYMPH NODE BIOPSY;  Surgeon: [name], MD;  Location: [facility];  Service: General;  Laterality: Right;   CHOLECYSTECTOMY     RE-EXCISION OF BREAST LUMPECTOMY Right 11/11/2019   Procedure: RIGHT RE-EXCISION OF BREAST  LUMPECTOMY;  Surgeon: [name], MD;  Location: [facility];  Service: General;  Laterality: Right;  RNFA   SKIN SURGERY  12/2015   Fatty tumor removed   TONSILLECTOMY AND ADENOIDECTOMY  1956   Family History  Problem Relation Age of Onset   Ulcerative colitis Mother    Diabetes Mother    Heart disease Father    Lung cancer Father 4       Lung   Varicose Veins Sister    Other Sister        "stiff heart"   Diabetes Brother    Pancreatic cancer Brother 51       Pancreatic   Crohn's disease Brother    Ulcerative colitis Brother    Diabetes Brother    Cancer Brother        unsure of type   Heart attack Paternal Grandfather    Diabetes Maternal Aunt    Diabetes Maternal Uncle    Lung cancer Paternal Aunt        smoker   Cancer Paternal Aunt        unknown type   Social History   Socioeconomic History   Marital status: Married    Spouse name: Not on file   Number of children: 1   Years of education: Not on file   Highest education  level: Not on file  Occupational History   Occupation: Retired     Comment: Oceanographer   Tobacco Use   Smoking status: Never   Smokeless tobacco: Never  Vaping Use   Vaping Use: Never used  Substance and Sexual Activity   Alcohol use: Yes    Comment: occa   Drug use: No   Sexual activity: Yes    Birth control/protection: Post-menopausal  Other Topics Concern   Not on file  Social History Narrative   Married, cares for disabled husband,  no tob, Etoh or drug use; no exercise   Social Determinants of Health   Financial Resource Strain: Low Risk    Difficulty of Paying Living Expenses: Not hard at all  Food Insecurity: No Food Insecurity   Worried About Charity fundraiser in the Last Year: Never true   [place] in the Last Year: Never true  Transportation Needs: No Transportation Needs   Lack of Transportation (Medical): No   Lack of Transportation (Non-Medical): No  Physical Activity: Inactive   Days of  Exercise per Week: 0 days   Minutes of Exercise per Session: 0 min  Stress: No Stress Concern Present   Feeling of Stress : Not at all  Social Connections: Socially Integrated   Frequency of Communication with Friends and Family: More than three times a week   Frequency of Social Gatherings with Friends and Family: Once a week   Attends Religious Services: More than 4 times per year   Active Member of [employer] or Organizations: Yes   Attends Archivist Meetings: 1 to 4 times per year   Marital Status: Married  Human resources officer Violence: Not At Risk   Fear of Current or Ex-Partner: No   Emotionally Abused: No   Physically Abused: No   Sexually Abused: No   Current Meds  Medication Sig   anastrozole (ARIMIDEX) 1 MG tablet Take 1 tablet (1 mg total) by mouth daily.   aspirin EC 81 MG tablet Take 1 tablet by mouth daily.   Biotin 2500 MCG CAPS Take 1 capsule by mouth daily.   Cholecalciferol (VITAMIN D3) 2000 units capsule Take 1 capsule by mouth daily.   dapagliflozin propanediol (FARXIGA) 10 MG TABS tablet Take 1 tablet (10 mg total) by mouth daily.   famotidine (PEPCID) 40 MG tablet Take 40 mg by mouth 2 (two) times daily.   fexofenadine (ALLEGRA) 180 MG tablet Take 180 mg by mouth daily.   levothyroxine (SYNTHROID) 112 MCG tablet Take 1 tablet (112 mcg total) by mouth daily.   mesalamine (LIALDA) 1.2 g EC tablet Take by mouth. Take 2 tablet in am and 2 tablet in pm   metFORMIN (GLUCOPHAGE) 1000 MG tablet Take 1 tablet (1,000 mg total) by mouth 2 (two) times daily with a meal.   Misc Natural Products (TURMERIC CURCUMIN) CAPS Take 1 capsule by mouth daily.   Multiple Vitamin (MULTIVITAMIN) tablet Take 1 tablet by mouth daily.   ramipril (ALTACE) 10 MG capsule Take 1 capsule (10 mg total) by mouth daily.   Tdap (BOOSTRIX) 5-2.5-18.5 LF-MCG/0.5 injection Inject 0.5 mLs into the muscle once for 1 dose.   Allergies  Allergen Reactions   Nickel Other (See Comments) and Rash     drainage   Sulfa Antibiotics Rash   Lipitor [Atorvastatin] Other (See Comments)    Hepatitis and myopathy, lfts 100s and elevated CK with symptoms   Bydureon [Exenatide] Nausea And Vomiting   Recent Results (from the  past 2160 hour(s))  CMP ([facility] only)     Status: Abnormal   Collection Time: 12/08/20  1:38 PM  Result Value Ref Range   Sodium 139 135 - 145 mmol/L   Potassium 4.7 3.5 - 5.1 mmol/L   Chloride 103 98 - 111 mmol/L   CO2 26 22 - 32 mmol/L   Glucose, Bld 173 (H) 70 - 99 mg/dL    Comment: Glucose reference range applies only to samples taken after fasting for at least 8 hours.   BUN 17 8 - 23 mg/dL   Creatinine 0.75 0.44 - 1.00 mg/dL   Calcium 10.3 8.9 - 10.3 mg/dL   Total Protein 7.5 6.5 - 8.1 g/dL   Albumin 4.0 3.5 - 5.0 g/dL   AST 13 (L) 15 - 41 U/L   ALT 13 0 - 44 U/L   Alkaline Phosphatase 86 38 - 126 U/L   Total Bilirubin 0.3 0.3 - 1.2 mg/dL   GFR, Estimated >60 >60 mL/min    Comment: (NOTE) Calculated using the CKD-EPI Creatinine Equation (2021)    Anion gap 10 5 - 15    Comment: Performed at [facility], [place] [address], [place], [place] [address]  CBC with Differential ([place] Only)     Status: None   Collection Time: 12/08/20  1:38 PM  Result Value Ref Range   WBC Count 5.9 4.0 - 10.5 K/uL   RBC 4.84 3.87 - 5.11 MIL/uL   Hemoglobin 14.1 12.0 - 15.0 g/dL   HCT 43.8 36.0 - 46.0 %   MCV 90.5 80.0 - 100.0 fL   MCH 29.1 26.0 - 34.0 pg   MCHC 32.2 30.0 - 36.0 g/dL   RDW 13.4 11.5 - 15.5 %   Platelet Count 338 150 - 400 K/uL   nRBC 0.0 0.0 - 0.2 %   Neutrophils Relative % 60 %   Neutro Abs 3.5 1.7 - 7.7 K/uL   Lymphocytes Relative 30 %   Lymphs Abs 1.8 0.7 - 4.0 K/uL   Monocytes Relative 7 %   Monocytes Absolute 0.4 0.1 - 1.0 K/uL   Eosinophils Relative 2 %   Eosinophils Absolute 0.1 0.0 - 0.5 K/uL   Basophils Relative 1 %   Basophils Absolute 0.1 0.0 - 0.1 K/uL   Immature Granulocytes 0 %   Abs Immature  Granulocytes 0.02 0.00 - 0.07 K/uL    Comment: Performed at [facility], [place] [address] [place], [place] [address]  POCT HgB A1C     Status: Abnormal   Collection Time: 01/07/21  8:58 AM  Result Value Ref Range   Hemoglobin A1C 7.0 (A) 4.0 - 5.6 %  CBC with Differential/Platelet     Status: None   Collection Time: 01/07/21  9:37 AM  Result Value Ref Range   WBC 5.2 4.0 - 10.5 K/uL   RBC 4.84 3.87 - 5.11 Mil/uL   Hemoglobin 14.1 12.0 - 15.0 g/dL   HCT 43.3 36.0 - 46.0 %   MCV 89.4 78.0 - 100.0 fl   MCHC 32.7 30.0 - 36.0 g/dL   RDW 14.0 11.5 - 15.5 %   Platelets 312.0 150.0 - 400.0 K/uL   Neutrophils Relative % 65.6 43.0 - 77.0 %   Lymphocytes Relative 24.6 12.0 - 46.0 %   Monocytes Relative 6.5 3.0 - 12.0 %   Eosinophils Relative 2.6 0.0 - 5.0 %   Basophils Relative 0.7 0.0 - 3.0 %   Neutro Abs 3.4  1.4 - 7.7 K/uL   Lymphs Abs 1.3 0.7 - 4.0 K/uL   Monocytes Absolute 0.3 0.1 - 1.0 K/uL   Eosinophils Absolute 0.1 0.0 - 0.7 K/uL   Basophils Absolute 0.0 0.0 - 0.1 K/uL  Comprehensive metabolic panel     Status: Abnormal   Collection Time: 01/07/21  9:37 AM  Result Value Ref Range   Sodium 142 135 - 145 mEq/L   Potassium 4.7 3.5 - 5.1 mEq/L   Chloride 103 96 - 112 mEq/L   CO2 30 19 - 32 mEq/L   Glucose, Bld 156 (H) 70 - 99 mg/dL   BUN 16 6 - 23 mg/dL   Creatinine, Ser 0.60 0.40 - 1.20 mg/dL   Total Bilirubin 0.5 0.2 - 1.2 mg/dL   Alkaline Phosphatase 81 39 - 117 U/L   AST 11 0 - 37 U/L   ALT 13 0 - 35 U/L   Total Protein 7.0 6.0 - 8.3 g/dL   Albumin 4.2 3.5 - 5.2 g/dL   GFR 90.32 >60.00 mL/min    Comment: Calculated using the CKD-EPI Creatinine Equation (2021)   Calcium 10.1 8.4 - 10.5 mg/dL  CK     Status: Abnormal   Collection Time: 01/07/21  9:37 AM  Result Value Ref Range   Total CK 284 (H) 7 - 177 U/L  Lipid panel     Status: Abnormal   Collection Time: 01/07/21  9:37 AM  Result Value Ref Range   Cholesterol 213 (H) 0 - 200 mg/dL     Comment: ATP III Classification       Desirable:  < 200 mg/dL               Borderline High:  200 - 239 mg/dL          High:  > = 240 mg/dL   Triglycerides 182.0 (H) 0.0 - 149.0 mg/dL    Comment: Normal:  <150 mg/dLBorderline High:  150 - 199 mg/dL   HDL 69.60 >39.00 mg/dL   VLDL 36.4 0.0 - 40.0 mg/dL   LDL Cholesterol 107 (H) 0 - 99 mg/dL   Total CHOL/HDL Ratio 3     Comment:                Men          Women1/2 Average Risk     3.4          3.3Average Risk          5.0          4.42X Average Risk          9.6          7.13X Average Risk          15.0          11.0                       NonHDL 143.41     Comment: NOTE:  Non-HDL goal should be 30 mg/dL higher than patient's LDL goal (i.e. LDL goal of < 70 mg/dL, would have non-HDL goal of < 100 mg/dL)  TSH     Status: None   Collection Time: 01/07/21  9:37 AM  Result Value Ref Range   TSH 1.80 0.35 - 5.50 uIU/mL   Objective  Body mass index is 38.61 kg/m. Wt Readings from Last 3 Encounters:  01/27/21 193 lb 12.8 oz (87.9 kg)  01/07/21 191 lb 9.6 oz (86.9 kg)  12/08/20 193 lb 1.6 oz (87.6 kg)   Temp Readings from Last 3 Encounters:  01/27/21 97.6 F (36.4 C) (Temporal)  01/07/21 98 F (36.7 C) (Temporal)  12/08/20 (!) 97.5 F (36.4 C) (Oral)   BP Readings from Last 3 Encounters:  01/27/21 132/74  01/07/21 132/80  12/08/20 (!) 125/59   Pulse Readings from Last 3 Encounters:  01/27/21 78  01/07/21 80  12/08/20 79    Physical Exam Vitals and nursing note reviewed.  Constitutional:      Appearance: Normal appearance. She is well-developed and well-groomed.  HENT:     Head: Normocephalic and atraumatic.  Eyes:     Conjunctiva/sclera: Conjunctivae normal.     Pupils: Pupils are equal, round, and reactive to light.  Cardiovascular:     Rate and Rhythm: Normal rate and regular rhythm.     Heart sounds: Normal heart sounds. No murmur heard. Pulmonary:     Effort: Pulmonary effort is normal.     Breath sounds: Normal  breath sounds.  Abdominal:     General: Abdomen is flat. Bowel sounds are normal.     Tenderness: There is no abdominal tenderness.  Musculoskeletal:        General: No tenderness.  Skin:    General: Skin is warm and dry.  Neurological:     General: No focal deficit present.     Mental Status: She is alert and oriented to person, place, and time. Mental status is at baseline.     Cranial Nerves: Cranial nerves 2-12 are intact.     Gait: Gait is intact.  Psychiatric:        Attention and Perception: Attention and perception normal.        Mood and Affect: Mood and affect normal.        Speech: Speech normal.        Behavior: Behavior normal. Behavior is cooperative.        Thought Content: Thought content normal.        Cognition and Memory: Cognition and memory normal.        Judgment: Judgment normal.    Assessment  Plan  Hypertension associated with diabetes (HCC) farxiga 10 mg qd metformin 1000 mg on altace 10 mg zetia 10 mg qd   Malignant neoplasm of upper-outer quadrant of right breast in female, estrogen receptor positive ([place]) - Plan: MM DIAG BREAST TOMO BILATERAL  Hand arthritis - Plan: Ambulatory referral to Orthopedic Surgery Dr. Amedeo Plenty Trigger finger, unspecified finger, unspecified laterality - Plan: Ambulatory referral to Orthopedic Surgery  Hepatotoxicity due to statin drug  Hypothyroidism, on levo 112 mcg qd   Palpitations  Consider cardiology   HM Flu utd  4/4 moderna Prevnar and pna 23 utd  Tdap 10/04/10 rx today Shingrix 2/2   09/01/20 mammogram dx in 2021 ordered  Dexa 07/01/20 normal Q2 years  Pap out of age window  Colonoscopy ROI Dr. Collene Mares 03/29/16 diverticulosis IH polyp sigmoid colon adenoma  Provider: Dr. Olivia Mackie McLean-Scocuzza-Internal Medicine

## 2021-02-09 ENCOUNTER — Encounter: Payer: Self-pay | Admitting: Internal Medicine

## 2021-02-14 ENCOUNTER — Ambulatory Visit: Payer: Medicare Other | Attending: General Surgery

## 2021-02-14 ENCOUNTER — Other Ambulatory Visit: Payer: Self-pay

## 2021-02-14 VITALS — Wt 194.4 lb

## 2021-02-14 DIAGNOSIS — Z483 Aftercare following surgery for neoplasm: Secondary | ICD-10-CM | POA: Insufficient documentation

## 2021-02-14 NOTE — Therapy (Signed)
[facility] @ [place], [place], [address] Phone: [phone]   Fax:  [phone]  Physical Therapy Treatment  Patient Details  Name: Michelle Sherman MRN: [id number] Date of Birth: 1950/07/24 Referring Provider (PT): Cira Rue   Encounter Date: 02/14/2021   PT End of Session - 02/14/21 1515     Visit Number 5   # unchanged due to screen only   PT Start Time 8242    PT Stop Time 1520    PT Time Calculation (min) 6 min    Activity Tolerance Patient tolerated treatment well    Behavior During Therapy [facility] for tasks assessed/performed             Past Medical History:  Diagnosis Date   Allergy    Arthritis    Breast cancer ([place]) 08/2019   right breast IDC   Cataract    Complication of anesthesia    unable to void after surgery and had to stay overnight    Diabetes mellitus without complication ([place])    Family history of adverse reaction to anesthesia    sister, hard to wake up    Family history of lung cancer    Family history of pancreatic cancer    GERD (gastroesophageal reflux disease)    Hypertension    Sleep apnea    wears CPAP nightly   Thyroid disease    Ulcerative colitis ([place])     Past Surgical History:  Procedure Laterality Date   BREAST LUMPECTOMY WITH RADIOACTIVE SEED AND SENTINEL LYMPH NODE BIOPSY Right 10/14/2019   Procedure: RIGHT BREAST LUMPECTOMY WITH RADIOACTIVE SEED AND SENTINEL LYMPH NODE BIOPSY;  Surgeon: [name], MD;  Location: [facility];  Service: General;  Laterality: Right;   CHOLECYSTECTOMY     RE-EXCISION OF BREAST LUMPECTOMY Right 11/11/2019   Procedure: RIGHT RE-EXCISION OF BREAST LUMPECTOMY;  Surgeon: [name], MD;  Location: [facility];  Service: General;  Laterality: Right;  RNFA   SKIN SURGERY  12/2015   Fatty tumor removed   TONSILLECTOMY AND ADENOIDECTOMY  1956    Vitals:   02/14/21 1515  Weight: 194 lb 6 oz (88.2  kg)     Subjective Assessment - 02/14/21 1515     Subjective Pt returns for her 3 month L-Dex screen.    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 She had a right lumpectomy and sentinel node biopsy (2 negative nodes) on 10/14/2019. It is ER/PR positive and HER2 negative with a Ki67 of 5%. She cares for her husband who is in a motorized wheelchair and has bilateral BKA.                    L-DEX FLOWSHEETS - 02/14/21 1500       L-DEX LYMPHEDEMA SCREENING   Measurement Type Unilateral    L-DEX MEASUREMENT EXTREMITY Upper Extremity    POSITION  Standing    DOMINANT SIDE Right    At Risk Side Right    BASELINE SCORE (UNILATERAL) -2.2    L-DEX SCORE (UNILATERAL) 0.3    VALUE CHANGE (UNILAT) 2.5                                     PT Long Term Goals - 06/14/20 1314       PT LONG TERM  GOAL #1   Title Pt will report 50% reduction in right breast heaviness    Time 6    Period Weeks    Status Achieved      PT LONG TERM GOAL #2   Title Pt will purchase compression bra and be independent in its use    Time 6    Period Weeks    Status Achieved      PT LONG TERM GOAL #3   Title Pt will be independent in self breast MLD    Time 6    Period Weeks    Status Achieved                   Plan - 02/14/21 1517     Clinical Impression Statement Pt returns for her 3 month L-Dex screen. Her change from baseline of 2.5 is WNLs so no further treament is required at this time except to cont every 3 month L-Dex screens whicj pt is agreeable to.    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   Nonspecific reactive hepatitis 10/28/2020   Genetic testing 10/06/2019   Family history of pancreatic cancer    Family history of lung cancer    Malignant neoplasm of upper-outer quadrant of right breast in female, estrogen receptor positive ([place]) 09/18/2019   Incomplete uterovaginal prolapse 04/03/2019   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   Controlled type 2 diabetes mellitus without complication, without long-term current use of insulin ([place]) 11/01/2016   Hypertension associated with diabetes ([place]) 11/01/2016   Severe obesity (BMI 35.0-35.9 with comorbidity) ([place]) 11/01/2016   Combined hyperlipidemia associated with type 2 diabetes mellitus ([place]) 01/15/2012   Allergic rhinitis 10/05/2011   Ulcerative colitis without complications ([place]) 75/30/0511   Diverticulosis of colon 10/17/2010   Internal hemorrhoids 10/17/2010   Acquired hypothyroidism 10/04/2010   Osteoarthrosis of knee 10/04/2010   OSA on CPAP 06/28/2010    [name], PTA 02/14/2021, 3:18 PM  [facility] @ [place], [place], [address] Phone: [phone]   Fax:  [phone]  Name: STACEE EARP MRN: [id number] Date of Birth: January 29, 1950

## 2021-03-02 ENCOUNTER — Encounter (HOSPITAL_COMMUNITY): Payer: Self-pay

## 2021-03-02 DIAGNOSIS — G4733 Obstructive sleep apnea (adult) (pediatric): Secondary | ICD-10-CM | POA: Diagnosis not present

## 2021-03-02 DIAGNOSIS — I10 Essential (primary) hypertension: Secondary | ICD-10-CM | POA: Diagnosis not present

## 2021-03-18 DIAGNOSIS — C50411 Malignant neoplasm of upper-outer quadrant of right female breast: Secondary | ICD-10-CM | POA: Diagnosis not present

## 2021-03-18 DIAGNOSIS — Z170 Estrogen receptor positive status [ER+]: Secondary | ICD-10-CM | POA: Diagnosis not present

## 2021-03-29 ENCOUNTER — Other Ambulatory Visit: Payer: Self-pay

## 2021-03-29 ENCOUNTER — Encounter: Payer: Self-pay | Admitting: Nurse Practitioner

## 2021-03-29 DIAGNOSIS — Z170 Estrogen receptor positive status [ER+]: Secondary | ICD-10-CM

## 2021-03-29 DIAGNOSIS — C50411 Malignant neoplasm of upper-outer quadrant of right female breast: Secondary | ICD-10-CM

## 2021-03-29 MED ORDER — ANASTROZOLE 1 MG PO TABS: 1.0000 mg | ORAL_TABLET | Freq: Every day | ORAL | 1 refills | Status: DC

## 2021-04-11 ENCOUNTER — Ambulatory Visit: Payer: Medicare Other

## 2021-04-11 DIAGNOSIS — M13849 Other specified arthritis, unspecified hand: Secondary | ICD-10-CM | POA: Diagnosis not present

## 2021-04-11 DIAGNOSIS — M65332 Trigger finger, left middle finger: Secondary | ICD-10-CM | POA: Diagnosis not present

## 2021-04-11 DIAGNOSIS — M65331 Trigger finger, right middle finger: Secondary | ICD-10-CM | POA: Insufficient documentation

## 2021-04-11 DIAGNOSIS — G5603 Carpal tunnel syndrome, bilateral upper limbs: Secondary | ICD-10-CM | POA: Diagnosis not present

## 2021-04-11 DIAGNOSIS — M79641 Pain in right hand: Secondary | ICD-10-CM | POA: Diagnosis not present

## 2021-04-11 DIAGNOSIS — M151 Heberden's nodes (with arthropathy): Secondary | ICD-10-CM | POA: Diagnosis not present

## 2021-04-11 DIAGNOSIS — M65321 Trigger finger, right index finger: Secondary | ICD-10-CM

## 2021-04-11 DIAGNOSIS — M79642 Pain in left hand: Secondary | ICD-10-CM | POA: Diagnosis not present

## 2021-04-11 HISTORY — DX: Trigger finger, right middle finger: M65.331

## 2021-04-11 HISTORY — DX: Trigger finger, left middle finger: M65.332

## 2021-04-11 HISTORY — DX: Trigger finger, right index finger: M65.321

## 2021-04-12 DIAGNOSIS — G5603 Carpal tunnel syndrome, bilateral upper limbs: Secondary | ICD-10-CM | POA: Insufficient documentation

## 2021-04-13 ENCOUNTER — Ambulatory Visit: Payer: Medicare Other | Admitting: Family Medicine

## 2021-05-16 ENCOUNTER — Ambulatory Visit: Payer: Medicare Other | Attending: General Surgery

## 2021-05-16 VITALS — Wt 195.1 lb

## 2021-05-16 DIAGNOSIS — Z483 Aftercare following surgery for neoplasm: Secondary | ICD-10-CM | POA: Insufficient documentation

## 2021-05-16 NOTE — Therapy (Signed)
?OUTPATIENT PHYSICAL THERAPY [facility] SCREENING NOTE ? ? ?Patient Name: Michelle Sherman ?MRN: [id number] ?DOB:Feb 21, 1950, 71 y.o., female ?Today's Date: 05/16/2021 ? ?PCP: McLean-Scocuzza, Nino Glow, MD ?REFERRING PROVIDER: [name], MD ? ? PT End of Session - 05/16/21 1651   ? ? Visit Number 5   # unchanged due to screen only  ? PT Start Time 6384   ? PT Stop Time 5364   ? PT Time Calculation (min) 5 min   ? Activity Tolerance Patient tolerated treatment well   ? Behavior During Therapy [facility] for tasks assessed/performed   ? ?  ?  ? ?  ? ? ?Past Medical History:  ?Diagnosis Date  ? Allergy   ? Arthritis   ? Breast cancer ([place]) 08/2019  ? right breast IDC  ? Cataract   ? Complication of anesthesia   ? unable to void after surgery and had to stay overnight   ? Diabetes mellitus without complication ([place])   ? Family history of adverse reaction to anesthesia   ? sister, hard to wake up   ? Family history of lung cancer   ? Family history of pancreatic cancer   ? GERD (gastroesophageal reflux disease)   ? Hypertension   ? Sleep apnea   ? wears CPAP nightly  ? Thyroid disease   ? Ulcerative colitis ([place])   ? ?Past Surgical History:  ?Procedure Laterality Date  ? BREAST LUMPECTOMY WITH RADIOACTIVE SEED AND SENTINEL LYMPH NODE BIOPSY Right 10/14/2019  ? Procedure: RIGHT BREAST LUMPECTOMY WITH RADIOACTIVE SEED AND SENTINEL LYMPH NODE BIOPSY;  Surgeon: [name], MD;  Location: [facility];  Service: General;  Laterality: Right;  ? CHOLECYSTECTOMY    ? RE-EXCISION OF BREAST LUMPECTOMY Right 11/11/2019  ? Procedure: RIGHT RE-EXCISION OF BREAST LUMPECTOMY;  Surgeon: [name], MD;  Location: [facility];  Service: General;  Laterality: Right;  RNFA  ? SKIN SURGERY  12/2015  ? Fatty tumor removed  ? TONSILLECTOMY AND ADENOIDECTOMY  1956  ? ?Patient Active Problem List  ? Diagnosis Date Noted  ? Hepatotoxicity due to statin drug 01/27/2021  ? Palpitations 01/27/2021  ? Hand arthritis 01/27/2021  ?  Drug-induced hepatitis - lipitor 2022 01/07/2021  ? 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 Nonspecific reactive hepatitis 10/28/2020  ? Genetic testing 10/06/2019  ? Family history of pancreatic cancer   ? Family history of lung cancer   ? Malignant neoplasm of upper-outer quadrant of right breast in female, estrogen receptor positive ([place]) 09/18/2019  ? Incomplete uterovaginal prolapse 04/03/2019  ? 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 Controlled type 2 diabetes mellitus without complication, without long-term current use of insulin ([place]) 11/01/2016  ? Hypertension associated with diabetes ([place]) 11/01/2016  ? Severe obesity (BMI 35.0-35.9 with comorbidity) ([place]) 11/01/2016  ? Combined hyperlipidemia associated with type 2 diabetes mellitus ([place]) 01/15/2012  ? Allergic rhinitis 10/05/2011  ? Ulcerative colitis without complications ([place]) 68/03/2120  ? Diverticulosis of colon 10/17/2010  ? Internal hemorrhoids 10/17/2010  ? Acquired hypothyroidism 10/04/2010  ? Osteoarthrosis of knee 10/04/2010  ? OSA on CPAP 06/28/2010  ? ? ?REFERRING DIAG: right breast cancer at risk for lymphedema ? ?THERAPY DIAG:  ?Aftercare following surgery for neoplasm ? 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 She had a right lumpectomy and sentinel node biopsy (2 negative nodes) on 10/14/2019. It is ER/PR positive and HER2 negative with a Ki67 of 5%. She cares for her husband who is  in a motorized wheelchair and has bilateral BKA.  ? ?PRECAUTIONS: right UE Lymphedema risk, None ? ?SUBJECTIVE: Pt returns for her 3 month L-Dex screen.  ? ?PAIN:  ?Are you having pain? No ? ?SOZO SCREENING: ?Patient was assessed today using the SOZO machine to determine the lymphedema index score. This was compared to her baseline score. It was determined that she is within the recommended range when compared to her baseline and no further action is needed at this time. She will continue SOZO  screenings. These are done every 3 months for 2 years post operatively followed by every 6 months for 2 years, and then annually. ? ? ? ?[name], PTA ?05/16/2021, 4:53 PM ? ?  ? ?

## 2021-05-23 DIAGNOSIS — G5603 Carpal tunnel syndrome, bilateral upper limbs: Secondary | ICD-10-CM | POA: Diagnosis not present

## 2021-05-23 DIAGNOSIS — M13849 Other specified arthritis, unspecified hand: Secondary | ICD-10-CM | POA: Diagnosis not present

## 2021-06-06 ENCOUNTER — Other Ambulatory Visit: Payer: Self-pay

## 2021-06-06 DIAGNOSIS — Z170 Estrogen receptor positive status [ER+]: Secondary | ICD-10-CM

## 2021-06-06 NOTE — Progress Notes (Signed)
?[place]   ?[phone]) [phone] [phone]   ?Clinic Follow up Note  ? ?Patient Care Team: ?McLean-Scocuzza, Nino Glow, MD as PCP - General (Internal Medicine) ?Deneise Lever, MD as Consulting Physician (Pulmonary Disease) ?Juanita Craver, MD as Consulting Physician (Gastroenterology) ?Lyndee Hensen, PT as Physical Therapist (Physical Therapy) ?Trula Slade, DPM as Consulting Physician (Podiatry) ?Katy Apo, MD as Consulting Physician (Ophthalmology) ?Rockwell Germany, RN as Oncology Nurse Navigator ?Mauro Kaufmann, RN as Oncology Nurse Navigator ?[name], MD as Consulting Physician (General Surgery) ?Truitt Merle, MD as Consulting Physician (Hematology) ?Kyung Rudd, MD as Consulting Physician (Radiation Oncology) ?Alla Feeling, NP as Nurse Practitioner (Nurse Practitioner) ?Madelin Rear, [facility] as Pharmacist (Pharmacist) ?06/07/2021 ? ?CHIEF COMPLAINT: Follow-up right breast cancer ? ?SUMMARY OF ONCOLOGIC HISTORY: ?Oncology History Overview Note  ?Cancer Staging ?Malignant neoplasm of upper-outer quadrant of right breast in female, estrogen receptor positive ([place]) ?Staging form: Breast, AJCC 8th Edition ?- Clinical stage from 09/12/2019: Stage IA (cT1b, cN0, cM0, G1, ER+, PR+, HER2-) - Signed by Truitt Merle, MD on 09/23/2019 ? ?  ?Malignant neoplasm of upper-outer quadrant of right breast in female, estrogen receptor positive ([place])  ?09/05/2019 Mammogram  ? IMPRESSION: ?Indeterminate 9 x 6 x 6 mm mass involving the OUTER RIGHT breast at the 9 ?o'clock position approximately 5 cm from the nipple at POSTERIOR ?depth, located anterior to a normal appearing intramammary lymph ?Node.  ?  ?  ?09/12/2019 Cancer Staging  ? Staging form: Breast, AJCC 8th Edition ?- Clinical stage from 09/12/2019: Stage IA (cT1b, cN0, cM0, G1, ER+, PR+, HER2-) - Signed by Truitt Merle, MD on 09/23/2019 ? ?  ?09/12/2019 Initial Biopsy  ? Diagnosis ?1. Breast, right, needle core biopsy, 9 o'clock posterior ?-  INVASIVE DUCTAL CARCINOMA WITH PAPILLARY FEATURES. ?2. Breast, right, needle core biopsy, 9 o'clock anterior ?- INVASIVE DUCTAL CARCINOMA WITH PAPILLARY FEATURES. ?Microscopic Comment ?1. - 2. The specimens share similar morphologic features. E-cadherin is positive and CK5/6 is negative. P63, Calponin ?and SMM-1 demonstrate the absence of myoepithelium. Ancillary studies will be reported separately. Results ?reported to [facility] on 09/15/2019. Intradepartmental consultation (Dr. Tresa Moore). ?  ?09/12/2019 Receptors her2  ? 1. PROGNOSTIC INDICATORS ?Results: ?IMMUNOHISTOCHEMICAL AND MORPHOMETRIC ANALYSIS PERFORMED MANUALLY ?The tumor cells are NEGATIVE for Her2 (1+). ?Estrogen Receptor: 99%, POSITIVE, STRONG STAINING INTENSITY ?Progesterone Receptor: 99%, POSITIVE, STRONG STAINING INTENSITY ?Proliferation Marker Ki67: 5% ?  ?09/18/2019 Initial Diagnosis  ? Malignant neoplasm of upper-outer quadrant of right breast in female, estrogen receptor positive ([place]) ? ?  ?10/05/2019 Genetic Testing  ? Negative genetic testing:  No pathogenic variants detected on the [facility] Breast Cancer STAT Panel + Common Hereditary Cancers Panel. 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 The report date is 10/05/2019. ? ?The Breast Cancer STAT Panel offered by [facility] includes sequencing and deletion/duplication analysis for the following 9 genes:  ATM, BRCA1, BRCA2, CDH1, CHEK2, PALB2, PTEN, STK11 and TP53. The Common Hereditary Cancers Panel offered by [facility] includes sequencing and/or deletion duplication testing of the following 48 genes: APC, ATM, AXIN2, BARD1, BMPR1A, BRCA1, BRCA2, BRIP1, CDH1, CDK4, CDKN2A (p14ARF), CDKN2A (p16INK4a), CHEK2, CTNNA1, DICER1, EPCAM (Deletion/duplication testing only), GREM1 (promoter region deletion/duplication testing only), KIT, MEN1, MLH1, MSH2, MSH3, MSH6, MUTYH, NBN, NF1, NTHL1, PALB2, PDGFRA, PMS2, POLD1, POLE, PTEN, RAD50, RAD51C,  RAD51D, RNF43, SDHB, SDHC, SDHD, SMAD4, SMARCA4. STK11, TP53, TSC1, TSC2, and VHL.  The following genes were evaluated for sequence changes  only: SDHA and HOXB13 c.251G>A variant only.  ?  ?10/14/2019 Pathology Results  ? RIGHT BREAST LUMPECTOMY WITH RADIOACTIVE SEED AND SENTINEL LYMPH NODE BIOPSY by Dr Barry Dienes  ? ? ?FINAL MICROSCOPIC DIAGNOSIS:  ? ?A. BREAST, RIGHT, LUMPECTOMY:  ?- Multifocal invasive ductal carcinoma with papillary features, grade 2,  ?spanning 0.9 cm and 0.5 cm.  ?- Intermediate grade ductal carcinoma in situ.  ?- Invasive carcinoma present at original inferior margin broadly, final  ?inferior margin (Part F) is negative.  ?- Margins are negative for in situ carcinoma.  ?- Biopsy site.  ?- See oncology table.  ? ?B. BREAST, RIGHT ADDITIONAL POSTERIOR MARGIN, EXCISION:  ?- Benign breast tissue.  ? ?C. BREAST, RIGHT ADDITIONAL LATERAL MARGIN, EXCISION:  ?- Benign breast tissue.  ? ?D. BREAST, RIGHT ADDITIONAL SUPERIOR MARGIN, EXCISION:  ?- Benign breast tissue.  ? ?E. BREAST, RIGHT ADDITIONAL MEDIAL MARGIN, EXCISION:  ?- Invasive ductal carcinoma with papillary features, grade 2, spanning  ?0.5 cm.  ?- Invasive carcinoma is present at the new medial margin broadly.  ? ?F. BREAST, RIGHT ADDITIONAL INFERIOR MARGIN, EXCISION:  ?- Benign breast tissue.  ? ?G. LYMPH NODE, RIGHT AXILLARY #1, SENTINEL, EXCISION:  ?- One of one lymph nodes negative for carcinoma (0/1).  ? ?H. LYMPH NODE, RIGHT AXILLARY #2, SENTINEL, EXCISION:  ?- One of one lymph nodes negative for carcinoma (0/1). ?  ?10/14/2019 Oncotype testing  ? Oncotype  ?Recurrence Score 0 with distant recurrence risk at 9 years of 3%.  ?There is less then 1% benefit of chemotherapy ?  ?10/14/2019 Cancer Staging  ? Staging form: Breast, AJCC 8th Edition ?- Pathologic stage from 10/14/2019: Stage IA (pT1b, pN0, cM0, G2, ER+, PR+, HER2-, Oncotype DX score: 0) - Signed by Truitt Merle, MD on 01/29/2020 ? ?  ?11/11/2019 Pathology Results  ? RIGHT RE-EXCISION OF  BREAST LUMPECTOMY by Dr Barry Dienes  ? ? ?FINAL MICROSCOPIC DIAGNOSIS:  ? ?A. BREAST, RIGHT, RE-EXCISION OF MEDIAL MARGIN:  ?- Prior procedure site changes.  ?- No carcinoma identified.  ? ?COMMENT:  ? ?P63, Calponin and SMM-1 demonstrate the presence of myoepithelium in the  ?select focus.  ?  ?12/22/2019 - 01/19/2020 Radiation Therapy  ? Adjuvant Radiation with Dr Lisbeth Renshaw  ?  ?05/06/2020 Survivorship  ? SCP delivered by Cira Rue, NP  ?  ? ? ?CURRENT THERAPY: Adjuvant anastrozole started 01/2020 ? ?INTERVAL HISTORY: Ms. Lanza returns for follow-up as scheduled, last seen by Dr. Burr Medico 12/08/2020.  Mammogram scheduled 09/02/2021.  She continues anastrozole.  She is doing fairly well in general, under high stress lately with her husband's health issues.  She is gaining weight but trying to eat better.  She has diffuse muscle and joint aches and is stiff on standing, worse in the morning.  A lot of the discomfort goes away if she is standing or moving.  She may need carpal tunnel surgery on 1 or both hands soon.  She thinks this may have gotten a little worse since her last visit 6 months ago.  The right breast feels heavier, stable since surgery, denies new lump/mass, nipple discharge or inversion, or skin change.  She has a prolapsed uterus and "bumps on my vagina" that are not painful and have not changed over time.  She has been meaning to tell her PCP.  She no longer gets Pap smears.  She had a colonoscopy a year ago, Dr. Collene Mares plans to repeat 1 more at age 62. ? ?All other systems were reviewed with the  patient and are negative. ? ?MEDICAL HISTORY:  ?Past Medical History:  ?Diagnosis Date  ? Allergy   ? Arthritis   ? Breast cancer ([place]) 08/2019  ? right breast IDC  ? Cataract   ? Complication of anesthesia   ? unable to void after surgery and had to stay overnight   ? Diabetes mellitus without complication ([place])   ? Family history of adverse reaction to anesthesia   ? sister, hard to wake up   ? Family history of lung  cancer   ? Family history of pancreatic cancer   ? GERD (gastroesophageal reflux disease)   ? Hypertension   ? Sleep apnea   ? wears CPAP nightly  ? Thyroid disease   ? Ulcerative colitis ([place])   ? ? ?S

## 2021-06-07 ENCOUNTER — Other Ambulatory Visit: Payer: Self-pay

## 2021-06-07 ENCOUNTER — Inpatient Hospital Stay: Payer: Medicare Other | Attending: Nurse Practitioner

## 2021-06-07 ENCOUNTER — Inpatient Hospital Stay (HOSPITAL_BASED_OUTPATIENT_CLINIC_OR_DEPARTMENT_OTHER): Payer: Medicare Other | Admitting: Nurse Practitioner

## 2021-06-07 ENCOUNTER — Encounter: Payer: Self-pay | Admitting: Nurse Practitioner

## 2021-06-07 VITALS — BP 142/77 | HR 83 | Temp 97.3°F | Resp 17 | Wt 196.2 lb

## 2021-06-07 DIAGNOSIS — Z7982 Long term (current) use of aspirin: Secondary | ICD-10-CM | POA: Insufficient documentation

## 2021-06-07 DIAGNOSIS — Z170 Estrogen receptor positive status [ER+]: Secondary | ICD-10-CM | POA: Diagnosis not present

## 2021-06-07 DIAGNOSIS — E039 Hypothyroidism, unspecified: Secondary | ICD-10-CM | POA: Insufficient documentation

## 2021-06-07 DIAGNOSIS — I10 Essential (primary) hypertension: Secondary | ICD-10-CM | POA: Diagnosis not present

## 2021-06-07 DIAGNOSIS — M129 Arthropathy, unspecified: Secondary | ICD-10-CM | POA: Diagnosis not present

## 2021-06-07 DIAGNOSIS — Z923 Personal history of irradiation: Secondary | ICD-10-CM | POA: Insufficient documentation

## 2021-06-07 DIAGNOSIS — K219 Gastro-esophageal reflux disease without esophagitis: Secondary | ICD-10-CM | POA: Diagnosis not present

## 2021-06-07 DIAGNOSIS — C50411 Malignant neoplasm of upper-outer quadrant of right female breast: Secondary | ICD-10-CM

## 2021-06-07 DIAGNOSIS — Z79811 Long term (current) use of aromatase inhibitors: Secondary | ICD-10-CM | POA: Diagnosis not present

## 2021-06-07 DIAGNOSIS — Z7984 Long term (current) use of oral hypoglycemic drugs: Secondary | ICD-10-CM | POA: Diagnosis not present

## 2021-06-07 DIAGNOSIS — E119 Type 2 diabetes mellitus without complications: Secondary | ICD-10-CM | POA: Diagnosis not present

## 2021-06-07 DIAGNOSIS — G4730 Sleep apnea, unspecified: Secondary | ICD-10-CM | POA: Diagnosis not present

## 2021-06-07 LAB — CBC WITH DIFFERENTIAL (CANCER CENTER ONLY)
Abs Immature Granulocytes: 0.02 10*3/uL (ref 0.00–0.07)
Basophils Absolute: 0.1 10*3/uL (ref 0.0–0.1)
Basophils Relative: 1 %
Eosinophils Absolute: 0.2 10*3/uL (ref 0.0–0.5)
Eosinophils Relative: 3 %
HCT: 45.4 % (ref 36.0–46.0)
Hemoglobin: 14.8 g/dL (ref 12.0–15.0)
Immature Granulocytes: 0 %
Lymphocytes Relative: 26 %
Lymphs Abs: 1.4 10*3/uL (ref 0.7–4.0)
MCH: 29.9 pg (ref 26.0–34.0)
MCHC: 32.6 g/dL (ref 30.0–36.0)
MCV: 91.7 fL (ref 80.0–100.0)
Monocytes Absolute: 0.4 10*3/uL (ref 0.1–1.0)
Monocytes Relative: 7 %
Neutro Abs: 3.5 10*3/uL (ref 1.7–7.7)
Neutrophils Relative %: 63 %
Platelet Count: 304 10*3/uL (ref 150–400)
RBC: 4.95 MIL/uL (ref 3.87–5.11)
RDW: 13.5 % (ref 11.5–15.5)
WBC Count: 5.5 10*3/uL (ref 4.0–10.5)
nRBC: 0 % (ref 0.0–0.2)

## 2021-06-07 LAB — CMP (CANCER CENTER ONLY)
ALT: 15 U/L (ref 0–44)
AST: 14 U/L — ABNORMAL LOW (ref 15–41)
Albumin: 4.3 g/dL (ref 3.5–5.0)
Alkaline Phosphatase: 87 U/L (ref 38–126)
Anion gap: 8 (ref 5–15)
BUN: 16 mg/dL (ref 8–23)
CO2: 28 mmol/L (ref 22–32)
Calcium: 9.7 mg/dL (ref 8.9–10.3)
Chloride: 103 mmol/L (ref 98–111)
Creatinine: 0.7 mg/dL (ref 0.44–1.00)
GFR, Estimated: >60 mL/min (ref >60)
Glucose, Bld: 191 mg/dL — ABNORMAL HIGH (ref 70–99)
Potassium: 3.6 mmol/L (ref 3.5–5.1)
Sodium: 139 mmol/L (ref 135–145)
Total Bilirubin: 0.4 mg/dL (ref 0.3–1.2)
Total Protein: 7.3 g/dL (ref 6.5–8.1)

## 2021-06-23 ENCOUNTER — Encounter: Payer: Self-pay | Admitting: Hematology

## 2021-07-08 ENCOUNTER — Encounter: Payer: Self-pay | Admitting: Nurse Practitioner

## 2021-07-08 ENCOUNTER — Other Ambulatory Visit: Payer: Self-pay

## 2021-07-11 ENCOUNTER — Ambulatory Visit (INDEPENDENT_AMBULATORY_CARE_PROVIDER_SITE_OTHER): Payer: Medicare Other

## 2021-07-11 VITALS — BP 127/80 | HR 80 | Temp 97.3°F | Resp 17 | Ht 61.0 in | Wt 196.0 lb

## 2021-07-11 DIAGNOSIS — Z0000 Encounter for general adult medical examination without abnormal findings: Secondary | ICD-10-CM | POA: Diagnosis not present

## 2021-07-11 NOTE — Patient Instructions (Addendum)
  Michelle Sherman , Thank you for taking time to come for your Medicare Wellness Visit. I appreciate your ongoing commitment to your health goals. Please review the following plan we discussed and let me know if I can assist you in the future.   These are the goals we discussed:  Goals      Weight (lb) < 150 lb (68 kg)     Lose weight by increasing exercise.  Ride stationary bike Portion control meals GOLO diet         This is a list of the screening recommended for you and due dates:  Health Maintenance  Topic Date Due   Hemoglobin A1C  07/08/2021   COVID-19 Vaccine (3 - Moderna risk series) 01/23/2022*   Complete foot exam   07/15/2021   Flu Shot  08/23/2021   Mammogram  09/01/2021   Eye exam for diabetics  12/20/2021   DEXA scan (bone density measurement)  07/02/2023   Colon Cancer Screening  08/26/2030   Pneumonia Vaccine  Completed   Hepatitis C Screening: USPSTF Recommendation to screen - Ages 58-79 yo.  Completed   Zoster (Shingles) Vaccine  Completed   HPV Vaccine  Aged Out   Tetanus Vaccine  Discontinued  *Topic was postponed. The date shown is not the original due date.

## 2021-07-11 NOTE — Progress Notes (Signed)
Subjective:   Michelle Sherman is a 71 y.o. female who presents for Medicare Annual (Subsequent) preventive examination.  Review of Systems    No ROS.  Medicare Wellness   Cardiac Risk Factors include: advanced age (>71mn, >>63women);diabetes mellitus;hypertension     Objective:    Today's Vitals   07/11/21 1125  BP: 127/80  Pulse: 80  Resp: 17  Temp: (!) 97.3 F (36.3 C)  SpO2: 96%  Weight: 196 lb (88.9 kg)  Height: 5' 1"  (1.549 m)   Body mass index is 37.03 kg/m.     07/11/2021   11:45 AM 05/20/2020   11:18 AM 04/05/2020    1:20 PM 01/29/2020    1:56 PM 12/22/2019    2:22 PM 12/11/2019    9:27 AM 11/11/2019    9:54 AM  Advanced Directives  Does Patient Have a Medical Advance Directive? No No No No No No No  Would patient like information on creating a medical advance directive? No - Patient declined No - Patient declined Yes (MAU/Ambulatory/Procedural Areas - Information given)  No - Patient declined No - Patient declined No - Patient declined    Current Medications (verified) Outpatient Encounter Medications as of 07/11/2021  Medication Sig   anastrozole (ARIMIDEX) 1 MG tablet Take 1 tablet (1 mg total) by mouth daily.   aspirin EC 81 MG tablet Take 1 tablet by mouth daily.   Biotin 2500 MCG CAPS Take 1 capsule by mouth daily.   Cholecalciferol (VITAMIN D3) 2000 units capsule Take 1 capsule by mouth daily.   dapagliflozin propanediol (FARXIGA) 10 MG TABS tablet Take 1 tablet (10 mg total) by mouth daily.   ezetimibe (ZETIA) 10 MG tablet Take 1 tablet (10 mg total) by mouth daily.   famotidine (PEPCID) 40 MG tablet Take 40 mg by mouth 2 (two) times daily.   fexofenadine (ALLEGRA) 180 MG tablet Take 180 mg by mouth daily.   levothyroxine (SYNTHROID) 112 MCG tablet Take 1 tablet (112 mcg total) by mouth daily.   mesalamine (LIALDA) 1.2 g EC tablet Take by mouth. Take 2 tablet in am and 2 tablet in pm   metFORMIN (GLUCOPHAGE) 1000 MG tablet Take 1 tablet (1,000 mg  total) by mouth 2 (two) times daily with a meal.   Misc Natural Products (TURMERIC CURCUMIN) CAPS Take 1 capsule by mouth daily.   Multiple Vitamin (MULTIVITAMIN) tablet Take 1 tablet by mouth daily.   ramipril (ALTACE) 10 MG capsule Take 1 capsule (10 mg total) by mouth daily.   traZODone (DESYREL) 50 MG tablet Take 0.5-1 tablets (25-50 mg total) by mouth at bedtime as needed for sleep.   No facility-administered encounter medications on file as of 07/11/2021.    Allergies (verified) Nickel, Sulfa antibiotics, Lipitor [atorvastatin], and Bydureon [exenatide]   History: Past Medical History:  Diagnosis Date   Allergy    Arthritis    Breast cancer ([place] 08/2019   right breast IDC   Cataract    Complication of anesthesia    unable to void after surgery and had to stay overnight    Diabetes mellitus without complication ([place]    Family history of adverse reaction to anesthesia    sister, hard to wake up    Family history of lung cancer    Family history of pancreatic cancer    GERD (gastroesophageal reflux disease)    Hypertension    Sleep apnea    wears CPAP nightly   Thyroid disease    Ulcerative colitis (  [place])    Past Surgical History:  Procedure Laterality Date   BREAST LUMPECTOMY WITH RADIOACTIVE SEED AND SENTINEL LYMPH NODE BIOPSY Right 10/14/2019   Procedure: RIGHT BREAST LUMPECTOMY WITH RADIOACTIVE SEED AND SENTINEL LYMPH NODE BIOPSY;  Surgeon: [name], MD;  Location: [place];  Service: General;  Laterality: Right;   CHOLECYSTECTOMY     RE-EXCISION OF BREAST LUMPECTOMY Right 11/11/2019   Procedure: RIGHT RE-EXCISION OF BREAST LUMPECTOMY;  Surgeon: [name], MD;  Location: [place];  Service: General;  Laterality: Right;  RNFA   SKIN SURGERY  12/2015   Fatty tumor removed   TONSILLECTOMY AND ADENOIDECTOMY  1956   Family History  Problem Relation Age of Onset   Ulcerative colitis Mother    Diabetes Mother    Heart disease  Father    Lung cancer Father 31       Lung   Varicose Veins Sister    Other Sister        "stiff heart"   Diabetes Brother    Pancreatic cancer Brother 31       Pancreatic   Crohn's disease Brother    Ulcerative colitis Brother    Diabetes Brother    Cancer Brother        unsure of type   Heart attack Paternal Grandfather    Diabetes Maternal Aunt    Diabetes Maternal Uncle    Lung cancer Paternal Aunt        smoker   Cancer Paternal Aunt        unknown type   Social History   Socioeconomic History   Marital status: Married    Spouse name: Not on file   Number of children: 1   Years of education: Not on file   Highest education level: Not on file  Occupational History   Occupation: Retired     Comment: Oceanographer   Tobacco Use   Smoking status: Never   Smokeless tobacco: Never  Scientific laboratory technician Use: Never used  Substance and Sexual Activity   Alcohol use: Yes    Comment: occa   Drug use: No   Sexual activity: Yes    Birth control/protection: Post-menopausal  Other Topics Concern   Not on file  Social History Narrative   Married, cares for disabled husband,  no tob, Etoh or drug use; no exercise   Social Determinants of Health   Financial Resource Strain: Low Risk  (07/11/2021)   Overall Financial Resource Strain (CARDIA)    Difficulty of Paying Living Expenses: Not hard at all  Food Insecurity: No Food Insecurity (07/11/2021)   Hunger Vital Sign    Worried About Running Out of Food in the Last Year: Never true    Ran Out of Food in the Last Year: Never true  Transportation Needs: No Transportation Needs (07/11/2021)   PRAPARE - Hydrologist (Medical): No    Lack of Transportation (Non-Medical): No  Physical Activity: Inactive (04/05/2020)   Exercise Vital Sign    Days of Exercise per Week: 0 days    Minutes of Exercise per Session: 0 min  Stress: No Stress Concern Present (07/11/2021)   [place]    Feeling of Stress : Not at all  Social Connections: [place] (07/11/2021)   Social Connection and Isolation Panel [NHANES]    Frequency of Communication with Friends and Family: More than three times a week  Frequency of Social Gatherings with Friends and Family: Once a week    Attends Religious Services: More than 4 times per year    Active Member of [employer] or Organizations: Yes    Attends Archivist Meetings: 1 to 4 times per year    Marital Status: Married    Tobacco Counseling Counseling given: Not Answered   Clinical Intake:  Pre-visit preparation completed: Yes        Diabetes: Yes (Followed by PCP)  How often do you need to have someone help you when you read instructions, pamphlets, or other written materials from your doctor or pharmacy?: 1 - Never Interpreter Needed?: No    Activities of Daily Living    07/11/2021   11:30 AM  In your present state of health, do you have any difficulty performing the following activities:  Hearing? 0  Vision? 0  Difficulty concentrating or making decisions? 0  Walking or climbing stairs? 0  Dressing or bathing? 0  Doing errands, shopping? 0  Preparing Food and eating ? N  Using the Toilet? N  In the past six months, have you accidently leaked urine? N  Do you have problems with loss of bowel control? N  Managing your Medications? N  Managing your Finances? N  Housekeeping or managing your Housekeeping? N   Patient Care Team: McLean-Scocuzza, Nino Glow, MD as PCP - General (Internal Medicine) Deneise Lever, MD as Consulting Physician (Pulmonary Disease) Juanita Craver, MD as Consulting Physician (Gastroenterology) Lyndee Hensen, PT as Physical Therapist (Physical Therapy) Trula Slade, DPM as Consulting Physician (Podiatry) Katy Apo, MD as Consulting Physician (Ophthalmology) Rockwell Germany, RN as Oncology Nurse Navigator Mauro Kaufmann, RN as  Oncology Nurse Navigator [name], MD as Consulting Physician (General Surgery) Truitt Merle, MD as Consulting Physician (Hematology) Kyung Rudd, MD as Consulting Physician (Radiation Oncology) Alla Feeling, NP as Nurse Practitioner (Nurse Practitioner) Madelin Rear, [facility] as Pharmacist (Pharmacist)  Indicate any recent Medical Services you may have received from other than [facility] providers in the past year (date may be approximate).     Assessment:   This is a routine wellness examination for Pallie.  Hearing/Vision screen Hearing Screening - Comments:: Pt mild loss   Patient is able to hear conversational tones without difficulty.   Vision Screening - Comments:: Pt follows up with Dr Katy Apo for eye exams   Dietary issues and exercise activities discussed:   She tries to eat a healthy diet Good water intake   Goals Addressed             This Visit's Progress    Weight (lb) < 150 lb (68 kg)   196 lb (88.9 kg)    Lose weight by increasing exercise.  Ride stationary bike Portion control meals GOLO diet        Depression Screen    07/11/2021   11:28 AM 01/27/2021   11:49 AM 04/05/2020    1:19 PM 01/07/2020    8:37 AM 04/03/2019   11:04 AM 04/03/2019   10:51 AM 03/27/2018   11:27 AM  PHQ 2/9 Scores  PHQ - 2 Score 0 0 0 0 0 0 0    Fall Risk    07/11/2021   11:30 AM 01/27/2021   11:49 AM 04/05/2020    1:21 PM 01/07/2020    8:37 AM 04/03/2019   11:04 AM  Fall Risk   Falls in the past year? 0 0 0 0 0  Number falls  in past yr:  0 0 0 0  Injury with Fall?  0 0 0 0  Risk for fall due to :  No Fall Risks Impaired vision;Impaired balance/gait  No Fall Risks  Follow up Falls evaluation completed Falls evaluation completed Falls prevention discussed  Falls evaluation completed;Education provided;Falls prevention discussed    FALL RISK PREVENTION PERTAINING TO THE HOME: Home free of loose throw rugs in walkways, pet beds, electrical cords, etc? Yes  Adequate lighting  in your home to reduce risk of falls? Yes   ASSISTIVE DEVICES UTILIZED TO PREVENT FALLS: Life alert? No  Use of a cane, walker or w/c? No   TIMED UP AND GO: Was the test performed? Yes .  Length of time to ambulate 10 feet: 10 sec.   Gait steady and fast without use of assistive device  Cognitive Function: Patient is alert and oriented x3.      03/27/2018   11:28 AM  MMSE - Mini Mental State Exam  Orientation to time 5  Orientation to Place 5  Registration 3  Attention/ Calculation 5  Recall 3  Language- name 2 objects 2  Language- repeat 1  Language- follow 3 step command 3  Language- read & follow direction 1  Write a sentence 1  Copy design 1  Total score 30        04/05/2020    1:26 PM 04/03/2019   11:28 AM  6CIT Screen  What Year? 0 points 0 points  What month? 0 points 0 points  What time?  0 points  Count back from 20 0 points 0 points  Months in reverse 0 points 0 points  Repeat phrase 0 points 0 points  Total Score  0 points    Immunizations Immunization History  Administered Date(s) Administered   Fluad Quad(high Dose 65+) 12/15/2019, 10/27/2020   Influenza, High Dose Seasonal PF 12/06/2015, 11/01/2016, 09/21/2017, 10/26/2018   Moderna SARS-COV2 Booster Vaccination 04/15/2020, 04/15/2020   Moderna Sars-Covid-2 Vaccination 04/24/2019, 05/21/2019   Pneumococcal Conjugate-13 10/22/2014   Pneumococcal Polysaccharide-23 10/05/2011, 12/21/2016   Tdap 10/04/2010   Zoster Recombinat (Shingrix) 01/02/2019, 03/25/2019   Zoster, Live 10/03/2009   Screening Tests Health Maintenance  Topic Date Due   HEMOGLOBIN A1C  07/08/2021   COVID-19 Vaccine (3 - Moderna risk series) 01/23/2022 (Originally 05/13/2020)   FOOT EXAM  07/15/2021   INFLUENZA VACCINE  08/23/2021   MAMMOGRAM  09/01/2021   OPHTHALMOLOGY EXAM  12/20/2021   DEXA SCAN  07/02/2023   COLONOSCOPY (Pts 45-83yr Insurance coverage will need to be confirmed)  08/26/2030   Pneumonia Vaccine 65+ Years  old  Completed   Hepatitis C Screening  Completed   Zoster Vaccines- Shingrix  Completed   HPV VACCINES  Aged Out   TETANUS/TDAP  Discontinued   Health Maintenance Health Maintenance Due  Topic Date Due   HEMOGLOBIN A1C  07/08/2021   Lung Cancer Screening: (Low Dose CT Chest recommended if Age 72-80years, 30 pack-year currently smoking OR have quit w/in 15years.) does not qualify.   Vision Screening: Recommended annual ophthalmology exams for early detection of glaucoma and other disorders of the eye.  Dental Screening: Recommended annual dental exams for proper oral hygiene  Community Resource Referral / Chronic Care Management: CRR required this visit?  No   CCM required this visit?  No      Plan:   Keep all routine maintenance appointments.   I have personally reviewed and noted the following in the patient's chart:  Medical and social history Use of alcohol, tobacco or illicit drugs  Current medications and supplements including opioid prescriptions.  Functional ability and status Nutritional status Physical activity Advanced directives List of other physicians Hospitalizations, surgeries, and ER visits in previous 12 months Vitals Screenings to include cognitive, depression, and falls Referrals and appointments  In addition, I have reviewed and discussed with patient certain preventive protocols, quality metrics, and best practice recommendations. A written personalized care plan for preventive services as well as general preventive health recommendations were provided to patient.     Varney Biles, LPN   [id number]

## 2021-07-12 DIAGNOSIS — K219 Gastro-esophageal reflux disease without esophagitis: Secondary | ICD-10-CM | POA: Diagnosis not present

## 2021-07-12 DIAGNOSIS — K5190 Ulcerative colitis, unspecified, without complications: Secondary | ICD-10-CM | POA: Diagnosis not present

## 2021-07-12 DIAGNOSIS — Z86010 Personal history of colonic polyps: Secondary | ICD-10-CM | POA: Diagnosis not present

## 2021-07-28 ENCOUNTER — Ambulatory Visit: Payer: Medicare Other | Admitting: Internal Medicine

## 2021-08-02 DIAGNOSIS — M25512 Pain in left shoulder: Secondary | ICD-10-CM | POA: Diagnosis not present

## 2021-08-09 ENCOUNTER — Encounter: Payer: Self-pay | Admitting: Internal Medicine

## 2021-08-09 ENCOUNTER — Ambulatory Visit (INDEPENDENT_AMBULATORY_CARE_PROVIDER_SITE_OTHER): Payer: Medicare Other | Admitting: Internal Medicine

## 2021-08-09 VITALS — BP 118/60 | HR 75 | Temp 98.1°F | Ht 61.0 in | Wt 195.6 lb

## 2021-08-09 DIAGNOSIS — E785 Hyperlipidemia, unspecified: Secondary | ICD-10-CM | POA: Diagnosis not present

## 2021-08-09 DIAGNOSIS — R768 Other specified abnormal immunological findings in serum: Secondary | ICD-10-CM

## 2021-08-09 DIAGNOSIS — R748 Abnormal levels of other serum enzymes: Secondary | ICD-10-CM | POA: Diagnosis not present

## 2021-08-09 DIAGNOSIS — M2550 Pain in unspecified joint: Secondary | ICD-10-CM | POA: Diagnosis not present

## 2021-08-09 DIAGNOSIS — R0789 Other chest pain: Secondary | ICD-10-CM

## 2021-08-09 DIAGNOSIS — I152 Hypertension secondary to endocrine disorders: Secondary | ICD-10-CM

## 2021-08-09 DIAGNOSIS — W57XXXD Bitten or stung by nonvenomous insect and other nonvenomous arthropods, subsequent encounter: Secondary | ICD-10-CM

## 2021-08-09 DIAGNOSIS — E039 Hypothyroidism, unspecified: Secondary | ICD-10-CM | POA: Diagnosis not present

## 2021-08-09 DIAGNOSIS — E1159 Type 2 diabetes mellitus with other circulatory complications: Secondary | ICD-10-CM

## 2021-08-09 DIAGNOSIS — A6920 Lyme disease, unspecified: Secondary | ICD-10-CM

## 2021-08-09 DIAGNOSIS — Z23 Encounter for immunization: Secondary | ICD-10-CM | POA: Diagnosis not present

## 2021-08-09 DIAGNOSIS — R7689 Other specified abnormal immunological findings in serum: Secondary | ICD-10-CM

## 2021-08-09 DIAGNOSIS — A770 Spotted fever due to Rickettsia rickettsii: Secondary | ICD-10-CM

## 2021-08-09 DIAGNOSIS — R238 Other skin changes: Secondary | ICD-10-CM | POA: Diagnosis not present

## 2021-08-09 DIAGNOSIS — E119 Type 2 diabetes mellitus without complications: Secondary | ICD-10-CM | POA: Diagnosis not present

## 2021-08-09 DIAGNOSIS — Z1283 Encounter for screening for malignant neoplasm of skin: Secondary | ICD-10-CM

## 2021-08-09 MED ORDER — TRAZODONE HCL 50 MG PO TABS: 25.0000 mg | ORAL_TABLET | Freq: Every evening | ORAL | 3 refills | Status: DC | PRN

## 2021-08-09 MED ORDER — LEVOTHYROXINE SODIUM 112 MCG PO TABS: 112.0000 ug | ORAL_TABLET | Freq: Every day | ORAL | 3 refills | Status: DC

## 2021-08-09 MED ORDER — LEVOTHYROXINE SODIUM 112 MCG PO TABS
112.0000 ug | ORAL_TABLET | Freq: Every day | ORAL | 3 refills | Status: DC
Start: 2021-08-09 — End: 2021-08-09

## 2021-08-09 MED ORDER — EZETIMIBE 10 MG PO TABS: 10.0000 mg | ORAL_TABLET | Freq: Every day | ORAL | 3 refills | Status: DC

## 2021-08-09 MED ORDER — METFORMIN HCL 1000 MG PO TABS
1000.0000 mg | ORAL_TABLET | Freq: Two times a day (BID) | ORAL | 3 refills | Status: DC
Start: 2021-08-09 — End: 2021-09-14

## 2021-08-09 MED ORDER — RAMIPRIL 10 MG PO CAPS: 10.0000 mg | ORAL_CAPSULE | Freq: Every day | ORAL | 3 refills | Status: DC

## 2021-08-09 MED ORDER — CLOBETASOL PROPIONATE 0.05 % EX CREA: 1.0000 | TOPICAL_CREAM | Freq: Two times a day (BID) | CUTANEOUS | 0 refills | Status: DC

## 2021-08-09 MED ORDER — EMPAGLIFLOZIN 25 MG PO TABS: 25.0000 mg | ORAL_TABLET | Freq: Every day | ORAL | 3 refills | Status: DC

## 2021-08-09 MED ORDER — TETANUS-DIPHTH-ACELL PERTUSSIS 5-2.5-18.5 LF-MCG/0.5 IM SUSP: 0.5000 mL | Freq: Once | INTRAMUSCULAR | 0 refills | Status: AC

## 2021-08-09 NOTE — Progress Notes (Addendum)
Chief Complaint  Patient presents with   Follow-up    6 month f/u with concerns about 71 year old red bump and itchiness along side elbow. Pt also would like to get checked for lime disease due to having several tick bites.   F/u 1. Dm 2 last A1c 7.0 on farxiga 10 mg but sugar in am 150s and eating habits not changed  Bydureon 11 shots made her have nausea and vomiting in the past no GLP is rec for now  Tried invokana in the past and controlled cbgs but farxiga is not doing it   2. Red bumps x 2 right forearm and upper arm itchy for a while tried otc creams and improved but then back 3. Ho multiple tick bites over years wants to be checked for tick born illness  4. Left shoulder pain repositioning husband 1 week ago radiates to left chest mri upcoming [facility] Refer cardiology with FH heart disease dad    Review of Systems  Constitutional:  Negative for weight loss.  HENT:  Negative for hearing loss.   Eyes:  Negative for blurred vision.  Respiratory:  Negative for shortness of breath.   Cardiovascular:  Negative for chest pain.  Gastrointestinal:  Negative for abdominal pain and blood in stool.  Genitourinary:  Negative for dysuria.  Musculoskeletal:  Negative for falls and joint pain.  Skin:  Negative for rash.  Neurological:  Negative for headaches.  Psychiatric/Behavioral:  Negative for depression.    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   Cataract    Complication of anesthesia    unable to void after surgery and had to stay overnight    Diabetes mellitus without complication ([place])    Family history of adverse reaction to anesthesia    sister, hard to wake up    Family history of lung cancer    Family history of pancreatic cancer    GERD (gastroesophageal reflux disease)    Hypertension    Sleep apnea    wears CPAP nightly   Thyroid disease    Ulcerative colitis ([place])    Past  Surgical History:  Procedure Laterality Date   BREAST LUMPECTOMY WITH RADIOACTIVE SEED AND SENTINEL LYMPH NODE BIOPSY Right 10/14/2019   Procedure: RIGHT BREAST LUMPECTOMY WITH RADIOACTIVE SEED AND SENTINEL LYMPH NODE BIOPSY;  Surgeon: [name], MD;  Location: [facility];  Service: General;  Laterality: Right;   CHOLECYSTECTOMY     RE-EXCISION OF BREAST LUMPECTOMY Right 11/11/2019   Procedure: RIGHT RE-EXCISION OF BREAST LUMPECTOMY;  Surgeon: [name], MD;  Location: [facility];  Service: General;  Laterality: Right;  RNFA   SKIN SURGERY  12/2015   Fatty tumor removed   TONSILLECTOMY AND ADENOIDECTOMY  1956   Family History  Problem Relation Age of Onset   Ulcerative colitis Mother    Diabetes Mother    Heart disease Father    Lung cancer Father 46       Lung   Varicose Veins Sister    Other Sister        "stiff heart"   Diabetes Brother    Pancreatic cancer Brother 23       Pancreatic   Crohn's disease Brother    Ulcerative colitis Brother    Diabetes Brother    Cancer Brother        unsure of type  Heart attack Paternal Grandfather    Diabetes Maternal Aunt    Diabetes Maternal Uncle    Lung cancer Paternal Aunt        smoker   Cancer Paternal Aunt        unknown type   Social History   Socioeconomic History   Marital status: Married    Spouse name: Not on file   Number of children: 1   Years of education: Not on file   Highest education level: Not on file  Occupational History   Occupation: Retired     Comment: Oceanographer   Tobacco Use   Smoking status: Never   Smokeless tobacco: Never  Scientific laboratory technician Use: Never used  Substance and Sexual Activity   Alcohol use: Yes    Comment: occa   Drug use: No   Sexual activity: Yes    Birth control/protection: Post-menopausal  Other Topics Concern   Not on file  Social History Narrative   Married, cares for disabled husband,  no tob, Etoh or drug use; no exercise    Social Determinants of Health   Financial Resource Strain: Low Risk  (07/11/2021)   Overall Financial Resource Strain (CARDIA)    Difficulty of Paying Living Expenses: Not hard at all  Food Insecurity: No Food Insecurity (07/11/2021)   Hunger Vital Sign    Worried About Running Out of Food in the Last Year: Never true    [place] in the Last Year: Never true  Transportation Needs: No Transportation Needs (07/11/2021)   PRAPARE - Hydrologist (Medical): No    Lack of Transportation (Non-Medical): No  Physical Activity: Inactive (04/05/2020)   Exercise Vital Sign    Days of Exercise per Week: 0 days    Minutes of Exercise per Session: 0 min  Stress: No Stress Concern Present (07/11/2021)   [place]    Feeling of Stress : Not at all  Social Connections: [place] (07/11/2021)   Social Connection and Isolation Panel [NHANES]    Frequency of Communication with Friends and Family: More than three times a week    Frequency of Social Gatherings with Friends and Family: Once a week    Attends Religious Services: More than 4 times per year    Active Member of [employer] or Organizations: Yes    Attends Archivist Meetings: 1 to 4 times per year    Marital Status: Married  Human resources officer Violence: Not At Risk (07/11/2021)   Humiliation, Afraid, Rape, and Kick questionnaire    Fear of Current or Ex-Partner: No    Emotionally Abused: No    Physically Abused: No    Sexually Abused: No   Current Meds  Medication Sig   anastrozole (ARIMIDEX) 1 MG tablet Take 1 tablet (1 mg total) by mouth daily.   aspirin EC 81 MG tablet Take 1 tablet by mouth daily.   Biotin 2500 MCG CAPS Take 1 capsule by mouth daily.   Cholecalciferol (VITAMIN D3) 2000 units capsule Take 1 capsule by mouth daily.   clobetasol cream (TEMOVATE) 4.16 % Apply 1 Application topically 2 (two) times daily. Right  arm x 2 bid prn   empagliflozin (JARDIANCE) 25 MG TABS tablet Take 1 tablet (25 mg total) by mouth daily before breakfast.   famotidine (PEPCID) 40 MG tablet Take 40 mg by mouth 2 (two) times daily.   fexofenadine (ALLEGRA) 180 MG tablet  Take 180 mg by mouth daily.   mesalamine (LIALDA) 1.2 g EC tablet Take by mouth. Take 2 tablet in am and 2 tablet in pm   Misc Natural Products (TURMERIC CURCUMIN) CAPS Take 1 capsule by mouth daily.   Multiple Vitamin (MULTIVITAMIN) tablet Take 1 tablet by mouth daily.   Tdap (BOOSTRIX) 5-2.5-18.5 LF-MCG/0.5 injection Inject 0.5 mLs into the muscle once for 1 dose.   [DISCONTINUED] dapagliflozin propanediol (FARXIGA) 10 MG TABS tablet Take 1 tablet (10 mg total) by mouth daily.   [DISCONTINUED] ezetimibe (ZETIA) 10 MG tablet Take 1 tablet (10 mg total) by mouth daily.   [DISCONTINUED] levothyroxine (SYNTHROID) 112 MCG tablet Take 1 tablet (112 mcg total) by mouth daily.   [DISCONTINUED] metFORMIN (GLUCOPHAGE) 1000 MG tablet Take 1 tablet (1,000 mg total) by mouth 2 (two) times daily with a meal.   [DISCONTINUED] ramipril (ALTACE) 10 MG capsule Take 1 capsule (10 mg total) by mouth daily.   [DISCONTINUED] traZODone (DESYREL) 50 MG tablet Take 0.5-1 tablets (25-50 mg total) by mouth at bedtime as needed for sleep.   Allergies  Allergen Reactions   Nickel Other (See Comments) and Rash    drainage   Sulfa Antibiotics Rash   Lipitor [Atorvastatin] Other (See Comments)    Hepatitis and myopathy, lfts 100s and elevated CK with symptoms   Bydureon [Exenatide] Nausea And Vomiting   Recent Results (from the past 2160 hour(s))  CMP ([facility] only)     Status: Abnormal   Collection Time: 06/07/21 10:11 AM  Result Value Ref Range   Sodium 139 135 - 145 mmol/L   Potassium 3.6 3.5 - 5.1 mmol/L   Chloride 103 98 - 111 mmol/L   CO2 28 22 - 32 mmol/L   Glucose, Bld 191 (H) 70 - 99 mg/dL    Comment: Glucose reference range applies only to samples taken after  fasting for at least 8 hours.   BUN 16 8 - 23 mg/dL   Creatinine 0.70 0.44 - 1.00 mg/dL   Calcium 9.7 8.9 - 10.3 mg/dL   Total Protein 7.3 6.5 - 8.1 g/dL   Albumin 4.3 3.5 - 5.0 g/dL   AST 14 (L) 15 - 41 U/L   ALT 15 0 - 44 U/L   Alkaline Phosphatase 87 38 - 126 U/L   Total Bilirubin 0.4 0.3 - 1.2 mg/dL   GFR, Estimated >60 >60 mL/min    Comment: (NOTE) Calculated using the CKD-EPI Creatinine Equation (2021)    Anion gap 8 5 - 15    Comment: Performed at [facility], [place] [address] [place], [place] [address]  CBC with Differential ([facility] Only)     Status: None   Collection Time: 06/07/21 10:11 AM  Result Value Ref Range   WBC Count 5.5 4.0 - 10.5 K/uL   RBC 4.95 3.87 - 5.11 MIL/uL   Hemoglobin 14.8 12.0 - 15.0 g/dL   HCT 45.4 36.0 - 46.0 %   MCV 91.7 80.0 - 100.0 fL   MCH 29.9 26.0 - 34.0 pg   MCHC 32.6 30.0 - 36.0 g/dL   RDW 13.5 11.5 - 15.5 %   Platelet Count 304 150 - 400 K/uL   nRBC 0.0 0.0 - 0.2 %   Neutrophils Relative % 63 %   Neutro Abs 3.5 1.7 - 7.7 K/uL   Lymphocytes Relative 26 %   Lymphs Abs 1.4 0.7 - 4.0 K/uL   Monocytes Relative 7 %   Monocytes Absolute 0.4  0.1 - 1.0 K/uL   Eosinophils Relative 3 %   Eosinophils Absolute 0.2 0.0 - 0.5 K/uL   Basophils Relative 1 %   Basophils Absolute 0.1 0.0 - 0.1 K/uL   Immature Granulocytes 0 %   Abs Immature Granulocytes 0.02 0.00 - 0.07 K/uL    Comment: Performed at [facility], [place] [address] [place],  [address]   Objective  Body mass index is 36.96 kg/m. Wt Readings from Last 3 Encounters:  08/09/21 195 lb 9.6 oz (88.7 kg)  07/11/21 196 lb (88.9 kg)  06/07/21 196 lb 3.2 oz (89 kg)   Temp Readings from Last 3 Encounters:  08/09/21 98.1 F (36.7 C) (Oral)  07/11/21 (!) 97.3 F (36.3 C)  06/07/21 (!) 97.3 F (36.3 C) (Tympanic)   BP Readings from Last 3 Encounters:  08/09/21 118/60  07/11/21 127/80  06/07/21 (!) 142/77   Pulse  Readings from Last 3 Encounters:  08/09/21 75  07/11/21 80  06/07/21 83    Physical Exam Vitals and nursing note reviewed.  Constitutional:      Appearance: Normal appearance. She is well-developed and well-groomed.  HENT:     Head: Normocephalic and atraumatic.  Eyes:     Conjunctiva/sclera: Conjunctivae normal.     Pupils: Pupils are equal, round, and reactive to light.  Cardiovascular:     Rate and Rhythm: Normal rate and regular rhythm.     Heart sounds: Normal heart sounds. No murmur heard. Pulmonary:     Effort: Pulmonary effort is normal.     Breath sounds: Normal breath sounds.  Abdominal:     General: Abdomen is flat. Bowel sounds are normal.     Tenderness: There is no abdominal tenderness.  Musculoskeletal:        General: No tenderness.  Skin:    General: Skin is warm and dry.       Neurological:     General: No focal deficit present.     Mental Status: She is alert and oriented to person, place, and time. Mental status is at baseline.     Cranial Nerves: Cranial nerves 2-12 are intact.     Gait: Gait is intact.  Psychiatric:        Attention and Perception: Attention and perception normal.        Mood and Affect: Mood and affect normal.        Speech: Speech normal.        Behavior: Behavior normal. Behavior is cooperative.        Thought Content: Thought content normal.        Cognition and Memory: Cognition and memory normal.        Judgment: Judgment normal.     Assessment  Plan  Papules right forearm and upper arm x 2- Plan: clobetasol cream (TEMOVATE) 0.05 %  Controlled type 2 diabetes mellitus without complication, without long-term current use of insulin ([place])  Hypertension associated with diabetes ([place]) - Plan: Lipid panel, Hemoglobin A1c, Urinalysis, Routine w reflex microscopic, Microalbumin / creatinine urine ratio Changed farxiga 10 mg to jardiance 25 mg qd  On zetia 10  On metformin 1000 mg bid Altace 10 mg qd  Could not prev  tolerated GLP 1 meds  Tick bite, unspecified site, subsequent encounter R/o [facility] spotted fever) Lyme disease  Elevated CK - Plan: CK (Creatine Kinase) She had been on statin in the past  Referred to [facility] (neurology in [place])  Neurology [facility] does not  do EMG/NCS in office and declined referral solely for this reason will have pt f/u with rheumatology  Hyperlipidemia, unspecified hyperlipidemia type On zetia 10 mg qd  Could not tolerate statin in the past   Elevated CK Polyarthralgia - Plan: Urinalysis, Routine w reflex microscopic, Antinuclear Antib (ANA), Rheumatoid Factor, Cyclic citrul peptide antibody, IgG ([facility]), Sedimentation rate, C-reactive protein ANA Titer 1 titer 1:320 High    Comment:                 Reference Range                  <1:40        Negative                  1:40-1:80    Low Antibody Level                  >1:80        Elevated Antibody Level  .   ANA Pattern 1  Nuclear, Nucleolar Abnormal    Comment: Nucleolar pattern is associated with systemic sclerosis  (scleroderma), systemic sclerosis/polymyositis overlap  and Sjogren's syndrome.  .  AC-8,9,10: Nucleolar  .  International Consensus on ANA Patterns  ([email or web address]   ANA TITER titer 1:320 High    Comment:                 Reference Range                  <1:40        Negative                  1:40-1:80    Low Antibody Level                  >1:80        Elevated Antibody Level  .   ANA PATTERN  Nuclear, Homogeneous Abnormal    Comment: Homogeneous pattern is associated with systemic lupus  erythematosus (SLE), drug-induced lupus and juvenile  idiopathic arthritis.  .  AC-1: Homogeneous  .  International Consensus on ANA Patterns  ([email or web address]   Resulting Agency  [facility]  Ck elevated   Latest Reference Range & Units 01/07/21 09:37 08/11/21 07:40  CK Total 7 - 177 U/L 284 (H) 297 (H)  (H): Data is abnormally  high  Referred to Dr. Amil Amen  Hypothyroidism, unspecified type On levo 112 mcg qd   Atypical chest pain left sided ? MSK related to left shoulder pain r/o cardiac etiology as well - Plan: Ambulatory referral to Cardiology MD visit  FH cardiac dx and h/o DM 2  HM Flu utd  4/4 moderna Prevnar and pna 23 utd  Tdap 10/04/10 rx today Shingrix 2/2    09/01/20 mammogram dx in 2021 ordered sch 09/02/21  Dexa 07/01/20 normal Q2 years  Pap out of age window  Colonoscopy [facility] Dr. Collene Mares 03/29/16 diverticulosis IH polyp sigmoid colon adenoma 08/25/20 repeat in 2 years Dr. Collene Mares age 19 and then at 47 per Dr. Collene Mares   [facility] left shoulder pain x 1 week as of 07/2021 pending MRI 7/19 or 08/17/21 left shoulder   Provider: Dr. Olivia Mackie [facility]

## 2021-08-09 NOTE — Patient Instructions (Addendum)
Colonoscopy Dr. Collene Mares due 08/26/22  Eye doctor appt 12/20/21 call for appt   Empagliflozin Tablets What is this medication? EMPAGLIFLOZIN (EM pa gli FLOE zin) treats type 2 diabetes. It works by helping your kidneys remove sugar (glucose) from your blood through the urine, which decreases your blood sugar. It can also be used to lower the risk of heart attack, stroke, and hospitalization for heart failure in people with type 2 diabetes. Changes to diet and exercise are often combined with this medication. This medicine may be used for other purposes; ask your health care provider or pharmacist if you have questions. COMMON BRAND NAME(S): Jardiance What should I tell my care team before I take this medication? They need to know if you have any of these conditions: Dehydration Diabetic ketoacidosis Diet low in salt Eating less due to illness, surgery, dieting, or any other reason Frequently drink alcohol Having surgery High cholesterol High levels of potassium in the blood History of pancreatitis or pancreas problems History of yeast infection of the penis or vagina Infections in the bladder, kidneys, or urinary tract Kidney disease Liver disease Low blood pressure On hemodialysis Problems urinating Type 1 diabetes Uncircumcised female An unusual or allergic reaction to empagliflozin, other medications, foods, dyes, or preservatives Pregnant or trying to get pregnant Breast-feeding How should I use this medication? Take this medication by mouth with water. Take it as directed on the prescription label at the same time every day. You may take it with or without food. Keep taking it unless your care team tells you to stop. A special MedGuide will be given to you by the pharmacist with each prescription and refill. Be sure to read this information carefully each time. Talk to your care team about the use of this medication in children. Special care may be needed. Overdosage: If you think you  have taken too much of this medicine contact a poison control center or emergency room at once. NOTE: This medicine is only for you. Do not share this medicine with others. What if I miss a dose? If you miss a dose, take it as soon as you can. If it is almost time for your next dose, take only that dose. Do not take double or extra doses. What may interact with this medication? Alcohol Diuretics Insulin Lithium This list may not describe all possible interactions. Give your health care provider a list of all the medicines, herbs, non-prescription drugs, or dietary supplements you use. Also tell them if you smoke, drink alcohol, or use illegal drugs. Some items may interact with your medicine. What should I watch for while using this medication? Visit your care team for regular checks on your progress. Tell your care team if your symptoms do not start to get better or if they get worse. This medication can cause a serious condition in which there is too much acid in the blood. If you develop nausea, vomiting, stomach pain, unusual tiredness, or breathing problems, stop taking this medication and call your care team right away. If possible, use a ketone dipstick to check for ketones in your urine. Check with your care team if you have severe diarrhea, nausea, and vomiting, or if you sweat a lot. The loss of too much body fluid may make it dangerous for you to take this medication. A test called the HbA1C (A1C) will be monitored. This is a simple blood test. It measures your blood sugar control over the last 2 to 3 months. You will receive  this test every 3 to 6 months. Learn how to check your blood sugar. Learn the symptoms of low and high blood sugar and how to manage them. Always carry a quick-source of sugar with you in case you have symptoms of low blood sugar. Examples include hard sugar candy or glucose tablets. Make sure others know that you can choke if you eat or drink when you develop serious  symptoms of low blood sugar, such as seizures or unconsciousness. Get medical help at once. Tell your care team if you have high blood sugar. You might need to change the dose of your medication. If you are sick or exercising more than usual, you may need to change the dose of your medication. What side effects may I notice from receiving this medication? Side effects that you should report to your care team as soon as possible: Allergic reactions--skin rash, itching, hives, swelling of the face, lips, tongue, or throat Dehydration--increased thirst, dry mouth, feeling faint or lightheaded, headache, dark yellow or brown urine Diabetic ketoacidosis (DKA)--increased thirst or amount of urine, dry mouth, fatigue, fruity odor to breath, trouble breathing, stomach pain, nausea, vomiting Genital yeast infection--redness, swelling, pain, or itchiness, odor, thick or lumpy discharge New pain or tenderness, change in skin color, sores or ulcers, infection of the leg or foot Infection or redness, swelling, tenderness, or pain in the genitals, or area from the genitals to the back of the rectum Urinary tract infection (UTI)--burning when passing urine, passing frequent small amounts of urine, bloody or cloudy urine, pain in the lower back or sides This list may not describe all possible side effects. Call your doctor for medical advice about side effects. You may report side effects to FDA at 1-800-FDA-1088. Where should I keep my medication? Keep out of the reach of children and pets. Store at room temperature between 20 and 25 degrees C (68 and 77 degrees F). Get rid of any unused medication after the expiration date. To get rid of medications that are no longer needed or have expired: Take the medication to a medication take-back program. Check with your pharmacy or law enforcement to find a location. If you cannot return the medication, check the label or package insert to see if the medication should be  thrown out in the garbage or flushed down the toilet. If you are not sure, ask your care team. If it is safe to put it in the trash, take the medication out of the container. Mix the medication with cat litter, dirt, coffee grounds, or other unwanted substance. Seal the mixture in a bag or container. Put it in the trash. NOTE: This sheet is a summary. It may not cover all possible information. If you have questions about this medicine, talk to your doctor, pharmacist, or health care provider.  2023 Elsevier/Gold Standard (2020-11-11 00:00:00)

## 2021-08-10 LAB — URINALYSIS, ROUTINE W REFLEX MICROSCOPIC
Bilirubin Urine: NEGATIVE
Hgb urine dipstick: NEGATIVE
Ketones, ur: NEGATIVE
Leukocytes,Ua: NEGATIVE
Nitrite: NEGATIVE
Protein, ur: NEGATIVE
Specific Gravity, Urine: 1.038 — ABNORMAL HIGH (ref 1.001–1.035)
pH: <=5 (ref 5.0–8.0)

## 2021-08-10 LAB — MICROALBUMIN / CREATININE URINE RATIO
Creatinine, Urine: 29 mg/dL (ref 20–275)
Microalb, Ur: <0.2 mg/dL

## 2021-08-11 ENCOUNTER — Other Ambulatory Visit (INDEPENDENT_AMBULATORY_CARE_PROVIDER_SITE_OTHER): Payer: Medicare Other

## 2021-08-11 DIAGNOSIS — M2550 Pain in unspecified joint: Secondary | ICD-10-CM

## 2021-08-11 DIAGNOSIS — A6920 Lyme disease, unspecified: Secondary | ICD-10-CM | POA: Diagnosis not present

## 2021-08-11 DIAGNOSIS — I152 Hypertension secondary to endocrine disorders: Secondary | ICD-10-CM

## 2021-08-11 DIAGNOSIS — E1159 Type 2 diabetes mellitus with other circulatory complications: Secondary | ICD-10-CM | POA: Diagnosis not present

## 2021-08-11 DIAGNOSIS — A770 Spotted fever due to Rickettsia rickettsii: Secondary | ICD-10-CM | POA: Diagnosis not present

## 2021-08-11 DIAGNOSIS — R748 Abnormal levels of other serum enzymes: Secondary | ICD-10-CM | POA: Diagnosis not present

## 2021-08-11 LAB — SEDIMENTATION RATE: Sed Rate: 15 mm/hr (ref 0–30)

## 2021-08-11 LAB — LIPID PANEL
Cholesterol: 162 mg/dL (ref 0–200)
HDL: 59.9 mg/dL (ref >39.00)
LDL Cholesterol: 77 mg/dL (ref 0–99)
NonHDL: 102.04
Total CHOL/HDL Ratio: 3
Triglycerides: 125 mg/dL (ref 0.0–149.0)
VLDL: 25 mg/dL (ref 0.0–40.0)

## 2021-08-11 LAB — HEMOGLOBIN A1C: Hgb A1c MFr Bld: 7.2 % — ABNORMAL HIGH (ref 4.6–6.5)

## 2021-08-11 LAB — C-REACTIVE PROTEIN: CRP: <1 mg/dL (ref 0.5–20.0)

## 2021-08-11 LAB — CK: Total CK: 297 U/L — ABNORMAL HIGH (ref 7–177)

## 2021-08-12 ENCOUNTER — Encounter: Payer: Self-pay | Admitting: Internal Medicine

## 2021-08-12 DIAGNOSIS — G4733 Obstructive sleep apnea (adult) (pediatric): Secondary | ICD-10-CM | POA: Diagnosis not present

## 2021-08-12 DIAGNOSIS — I10 Essential (primary) hypertension: Secondary | ICD-10-CM | POA: Diagnosis not present

## 2021-08-15 DIAGNOSIS — R748 Abnormal levels of other serum enzymes: Secondary | ICD-10-CM | POA: Insufficient documentation

## 2021-08-15 HISTORY — DX: Abnormal levels of other serum enzymes: R74.8

## 2021-08-15 NOTE — Addendum Note (Signed)
Addended by: Orland Mustard on: 08/15/2021 10:14 PM   Modules accepted: Orders

## 2021-08-16 DIAGNOSIS — R7689 Other specified abnormal immunological findings in serum: Secondary | ICD-10-CM | POA: Insufficient documentation

## 2021-08-16 DIAGNOSIS — R768 Other specified abnormal immunological findings in serum: Secondary | ICD-10-CM

## 2021-08-16 HISTORY — DX: Other specified abnormal immunological findings in serum: R76.8

## 2021-08-16 NOTE — Addendum Note (Signed)
Addended by: Orland Mustard on: 08/16/2021 06:37 PM   Modules accepted: Orders

## 2021-08-17 ENCOUNTER — Telehealth: Payer: Self-pay | Admitting: Internal Medicine

## 2021-08-17 ENCOUNTER — Ambulatory Visit: Payer: Medicare Other | Admitting: Internal Medicine

## 2021-08-17 DIAGNOSIS — M7552 Bursitis of left shoulder: Secondary | ICD-10-CM | POA: Diagnosis not present

## 2021-08-17 DIAGNOSIS — S46112A Strain of muscle, fascia and tendon of long head of biceps, left arm, initial encounter: Secondary | ICD-10-CM | POA: Diagnosis not present

## 2021-08-17 DIAGNOSIS — S46012A Strain of muscle(s) and tendon(s) of the rotator cuff of left shoulder, initial encounter: Secondary | ICD-10-CM | POA: Diagnosis not present

## 2021-08-17 DIAGNOSIS — M19012 Primary osteoarthritis, left shoulder: Secondary | ICD-10-CM | POA: Diagnosis not present

## 2021-08-17 DIAGNOSIS — S43432A Superior glenoid labrum lesion of left shoulder, initial encounter: Secondary | ICD-10-CM | POA: Diagnosis not present

## 2021-08-17 DIAGNOSIS — M67912 Unspecified disorder of synovium and tendon, left shoulder: Secondary | ICD-10-CM | POA: Diagnosis not present

## 2021-08-17 DIAGNOSIS — M25412 Effusion, left shoulder: Secondary | ICD-10-CM | POA: Diagnosis not present

## 2021-08-17 NOTE — Progress Notes (Unsigned)
HPI female never smoker followed for OSA complicated by hypothyroid, DM 2, hyperlipidemia, allergic rhinitis, HBP, ulcerative colitis, morbid obesity NPSG 06/04/06- AHI ? 23/ hr, desaturation to 66%, CPAP titrated to 8, body weight 242 lbs HST 02/05/17-AHI 23.4/hour, desaturation to 86%, body weight 192 pounds -----------------------------------------------------------------------------  08/16/20- 71 year old female never smoker followed for OSA complicated by hypothyroid, DM 2, hyperlipidemia, allergic rhinitis, HBP, ulcerative colitis, morbid obesity, Breast Canceer R,  CPAP auto 5-15/ [employer] Download-compliance 100%, AHI 0.8/ hr Body weight today-198 lbs Covid vax- Tried oral appliance.Chose to stay with CPAP. No concerns.  08/18/21- 71 year old female never smoker followed for OSA complicated by hypothyroid, DM 2, hyperlipidemia, allergic rhinitis, HBP, Ulcerative Colitis, morbid obesity, Breast Cancer R,  CPAP auto 5-15/ [employer] Download-compliance  Body weight today- Covid vax-    .ROS-see HPI   + = positive Constitutional:    weight loss, night sweats, fevers, chills, fatigue, lassitude. HEENT:    headaches, difficulty swallowing, tooth/dental problems, sore throat,       sneezing, itching, ear ache, nasal congestion, post nasal drip, snoring CV:    chest pain, orthopnea, PND, swelling in lower extremities, anasarca,                                   dizziness, palpitations Resp:   shortness of breath with exertion or at rest.                productive cough,   non-productive cough, coughing up of blood.              change in color of mucus.  wheezing.   Skin:    rash or lesions. GI:  No-   heartburn, indigestion, abdominal pain, nausea, vomiting, diarrhea,                 change in bowel habits, loss of appetite GU: dysuria, change in color of urine, no urgency or frequency.   flank pain. MS:   joint pain, +stiffness, decreased range of motion, back pain. Neuro-     nothing  unusual Psych:  change in mood or affect.  depression or anxiety.   memory loss.  OBJ- Physical Exam General- Alert, Oriented, Affect-appropriate, Distress- none acute, + obese Skin- rash-none, lesions- none, excoriation- none Lymphadenopathy- none Head- atraumatic            Eyes- Gross vision intact, PERRLA, conjunctivae and secretions clear            Ears- Hearing, canals-normal            Nose- Clear, no-Septal dev, mucus, polyps, erosion, perforation             Throat- Mallampati II-III , mucosa clear , drainage- none, tonsils- atrophic Neck- flexible , trachea midline, no stridor , thyroid nl, carotid no bruit Chest - symmetrical excursion , unlabored           Heart/CV- RRR , no murmur , no gallop  , no rub, nl s1 s2                           - JVD- none , edema- none, stasis changes- none, varices- none           Lung-    clear, wheeze- none, cough- none , dullness-none, rub- none           Chest wall-  Abd-  Br/ Gen/ Rectal- Not done, not indicated Extrem- cyanosis- none, clubbing, none, atrophy- none, strength- nl, + superficial varices L calf Neuro- grossly intact to observation

## 2021-08-17 NOTE — Telephone Encounter (Signed)
Michelle Sherman from [facility] need office notes and xrays for the pt Fax [phone]

## 2021-08-18 ENCOUNTER — Encounter: Payer: Self-pay | Admitting: Internal Medicine

## 2021-08-18 ENCOUNTER — Ambulatory Visit (INDEPENDENT_AMBULATORY_CARE_PROVIDER_SITE_OTHER): Payer: Medicare Other | Admitting: Internal Medicine

## 2021-08-18 DIAGNOSIS — Z9989 Dependence on other enabling machines and devices: Secondary | ICD-10-CM | POA: Diagnosis not present

## 2021-08-18 DIAGNOSIS — J3089 Other allergic rhinitis: Secondary | ICD-10-CM

## 2021-08-18 DIAGNOSIS — G4733 Obstructive sleep apnea (adult) (pediatric): Secondary | ICD-10-CM | POA: Diagnosis not present

## 2021-08-18 NOTE — Patient Instructions (Signed)
We can continue CPAP auto 5-15  Please call if we can help.  Good luck with your husband and with your shoulder.!

## 2021-08-18 NOTE — Assessment & Plan Note (Signed)
Not a significant problem with her CPAP mask for now.

## 2021-08-18 NOTE — Assessment & Plan Note (Signed)
Benefits from CPAP with good compliance and control Plan - continue auto 5-15

## 2021-08-20 LAB — TEST AUTHORIZATION

## 2021-08-20 LAB — RHEUMATOID FACTOR: Rheumatoid fact SerPl-aCnc: <14 IU/mL (ref <14)

## 2021-08-20 LAB — B. BURGDORFI ANTIBODIES: B burgdorferi Ab IgG+IgM: <0.9 index

## 2021-08-20 LAB — ROCKY MTN SPOTTED FVR ABS PNL(IGG+IGM)
RMSF IgG: NOT DETECTED
RMSF IgM: NOT DETECTED

## 2021-08-20 LAB — ANTI-NUCLEAR AB-TITER (ANA TITER)
ANA TITER: 1:320 {titer} — ABNORMAL HIGH
ANA Titer 1: 1:320 {titer} — ABNORMAL HIGH

## 2021-08-20 LAB — ANA: Anti Nuclear Antibody (ANA): POSITIVE — AB

## 2021-08-20 LAB — CYCLIC CITRUL PEPTIDE ANTIBODY, IGG: Cyclic Citrullin Peptide Ab: <16 UNITS

## 2021-08-24 DIAGNOSIS — S46112A Strain of muscle, fascia and tendon of long head of biceps, left arm, initial encounter: Secondary | ICD-10-CM | POA: Diagnosis not present

## 2021-08-24 DIAGNOSIS — M7542 Impingement syndrome of left shoulder: Secondary | ICD-10-CM | POA: Diagnosis not present

## 2021-08-25 ENCOUNTER — Telehealth: Payer: Self-pay | Admitting: Internal Medicine

## 2021-08-25 ENCOUNTER — Telehealth: Payer: Self-pay

## 2021-08-25 NOTE — Telephone Encounter (Signed)
[facility] declined my referral for elevated ck lab they do not see pts for this and they do not have machine now to do NCS/EMG   F/u rheumatology and ask rheumatology if they think she needs a nerve conduction stud(NCS)y/EMG at appt and rheumatology can order this

## 2021-08-26 NOTE — Telephone Encounter (Signed)
Was able to get in contact with pt

## 2021-08-27 NOTE — Progress Notes (Signed)
Cardiology Office Note:    Date:  08/31/2021   ID:  [name], DOB December 08, 1950, MRN [id number]  PCP:  McLean-Scocuzza, Nino Glow, MD  Cardiologist:  None   Referring MD: McLean-Scocuzza, Olivia Mackie *   Chief Complaint  Patient presents with   Hypertension   Hyperlipidemia   Advice Only    Left shoulder and left subclavicular chest pain    History of Present Illness:    Michelle Sherman is a 71 y.o. female with a hx of breast CA, primary Hypertension, hyperlipidemia with statin intolerance, DM II, OSA, and atypical left chest and shoulder pain with family h/o CAD.   Several weeks ago she was helping her husband putting on his prosthetics and developed a pop in the left shoulder.  Thereafter she had left arm pain and also discomfort radiating from the clavicle down into the lateral left chest.  She spoke to her primary physician about this after the orthopedist recommended that she talk to the primary care to make sure was not a heart problem.  The discomfort was subsequently completely resolved.  She feels it was all related to the injury.  Her risk factors of concern as noted above.  She is very active as she has a do a lot of physical work taking care of her husband who is a [place] veteran with physical limitations.  She denies orthopnea, PND, claudication, exertion related chest pain.  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   Cataract    Complication of anesthesia    unable to void after surgery and had to stay overnight    Diabetes mellitus without complication ([place])    Family history of adverse reaction to anesthesia    sister, hard to wake up    Family history of lung cancer    Family history of pancreatic cancer    GERD (gastroesophageal reflux disease)    Hypertension    Sleep apnea    wears CPAP nightly   Thyroid disease    Ulcerative colitis ([place])     Past Surgical History:  Procedure  Laterality Date   BREAST LUMPECTOMY WITH RADIOACTIVE SEED AND SENTINEL LYMPH NODE BIOPSY Right 10/14/2019   Procedure: RIGHT BREAST LUMPECTOMY WITH RADIOACTIVE SEED AND SENTINEL LYMPH NODE BIOPSY;  Surgeon: [name], MD;  Location: [facility];  Service: General;  Laterality: Right;   CHOLECYSTECTOMY     RE-EXCISION OF BREAST LUMPECTOMY Right 11/11/2019   Procedure: RIGHT RE-EXCISION OF BREAST LUMPECTOMY;  Surgeon: [name], MD;  Location: [facility];  Service: General;  Laterality: Right;  RNFA   SKIN SURGERY  12/2015   Fatty tumor removed   TONSILLECTOMY AND ADENOIDECTOMY  1956    Current Medications: Current Meds  Medication Sig   anastrozole (ARIMIDEX) 1 MG tablet Take 1 tablet (1 mg total) by mouth daily.   aspirin EC 81 MG tablet Take 1 tablet by mouth daily.   Biotin 2500 MCG CAPS Take 1 capsule by mouth daily.   Cholecalciferol (VITAMIN D3) 2000 units capsule Take 1 capsule by mouth daily.   clobetasol cream (TEMOVATE) 3.71 % Apply 1 Application topically 2 (two) times daily. Right arm x 2 bid prn   empagliflozin (JARDIANCE) 25 MG TABS tablet Take 1 tablet (25 mg total) by mouth daily before breakfast. (Patient taking differently: Take 25 mg by mouth daily before breakfast. Has not been  delivered. 08/18/21 HEI)   ezetimibe (ZETIA) 10 MG tablet Take 1 tablet (10 mg total) by mouth daily.   famotidine (PEPCID) 40 MG tablet Take 40 mg by mouth 2 (two) times daily.   fexofenadine (ALLEGRA) 180 MG tablet Take 180 mg by mouth daily.   levothyroxine (SYNTHROID) 112 MCG tablet Take 1 tablet (112 mcg total) by mouth daily.   mesalamine (LIALDA) 1.2 g EC tablet Take by mouth. Take 2 tablet in am and 2 tablet in pm   metFORMIN (GLUCOPHAGE) 1000 MG tablet Take 1 tablet (1,000 mg total) by mouth 2 (two) times daily with a meal.   Misc Natural Products (TURMERIC CURCUMIN) CAPS Take 1 capsule by mouth daily.   Multiple Vitamin (MULTIVITAMIN) tablet Take 1  tablet by mouth daily.   ramipril (ALTACE) 10 MG capsule Take 1 capsule (10 mg total) by mouth daily.   traZODone (DESYREL) 50 MG tablet Take 0.5-1 tablets (25-50 mg total) by mouth at bedtime as needed for sleep.     Allergies:   Nickel, Sulfa antibiotics, Lipitor [atorvastatin], and Bydureon [exenatide]   Social History   Socioeconomic History   Marital status: Married    Spouse name: Not on file   Number of children: 1   Years of education: Not on file   Highest education level: Not on file  Occupational History   Occupation: Retired     Comment: Oceanographer   Tobacco Use   Smoking status: Never   Smokeless tobacco: Never  Scientific laboratory technician Use: Never used  Substance and Sexual Activity   Alcohol use: Yes    Comment: occa   Drug use: No   Sexual activity: Yes    Birth control/protection: Post-menopausal  Other Topics Concern   Not on file  Social History Narrative   Married, cares for disabled husband,  no tob, Etoh or drug use; no exercise   Social Determinants of Health   Financial Resource Strain: Low Risk  (07/11/2021)   Overall Financial Resource Strain (CARDIA)    Difficulty of Paying Living Expenses: Not hard at all  Food Insecurity: No Food Insecurity (07/11/2021)   Hunger Vital Sign    Worried About Running Out of Food in the Last Year: Never true    [place] in the Last Year: Never true  Transportation Needs: No Transportation Needs (07/11/2021)   PRAPARE - Hydrologist (Medical): No    Lack of Transportation (Non-Medical): No  Physical Activity: Inactive (04/05/2020)   Exercise Vital Sign    Days of Exercise per Week: 0 days    Minutes of Exercise per Session: 0 min  Stress: No Stress Concern Present (07/11/2021)   [place]    Feeling of Stress : Not at all  Social Connections: [place] (07/11/2021)   Social Connection and Isolation Panel  [NHANES]    Frequency of Communication with Friends and Family: More than three times a week    Frequency of Social Gatherings with Friends and Family: Once a week    Attends Religious Services: More than 4 times per year    Active Member of [employer] or Organizations: Yes    Attends Archivist Meetings: 1 to 4 times per year    Marital Status: Married     Family History: The patient's family history includes Cancer in her brother, father, and paternal aunt; Crohn's disease in her brother; Diabetes in her brother,  brother, maternal aunt, maternal uncle, and mother; Heart attack in her paternal grandfather; Heart disease in her father; Lung cancer in her paternal aunt; Lung cancer (age of onset: 47) in her father; Other in her sister; Pancreatic cancer (age of onset: 64) in her brother; Ulcerative colitis in her brother and mother; Varicose Veins in her sister.  ROS:   Please see the history of present illness.    No specific complaints.  All other systems reviewed and are negative.  EKGs/Labs/Other Studies Reviewed:    The following studies were reviewed today: No new data  EKG:  EKG normal sinus rhythm with nonspecific ST abnormality.  Overall normal looking EKG  Recent Labs: 01/07/2021: TSH 1.80 06/07/2021: ALT 15; BUN 16; Creatinine 0.70; Hemoglobin 14.8; Platelet Count 304; Potassium 3.6; Sodium 139  Recent Lipid Panel    Component Value Date/Time   CHOL 162 08/11/2021 0740   TRIG 125.0 08/11/2021 0740   HDL 59.90 08/11/2021 0740   CHOLHDL 3 08/11/2021 0740   VLDL 25.0 08/11/2021 0740   LDLCALC 77 08/11/2021 0740   LDLCALC 62 01/07/2020 0907    Physical Exam:    VS:  BP 116/70   Pulse 81   Ht 5' (1.524 m)   Wt 193 lb (87.5 kg)   SpO2 97%   BMI 37.69 kg/m     Wt Readings from Last 3 Encounters:  08/31/21 193 lb (87.5 kg)  08/18/21 196 lb 9.6 oz (89.2 kg)  08/09/21 195 lb 9.6 oz (88.7 kg)     GEN: Obese. No acute distress HEENT: Normal NECK: No  JVD. LYMPHATICS: No lymphadenopathy CARDIAC: No murmur. RRR no gallop, or edema. VASCULAR:  Normal Pulses. No bruits. RESPIRATORY:  Clear to auscultation without rales, wheezing or rhonchi  ABDOMEN: Soft, non-tender, non-distended, No pulsatile mass, MUSCULOSKELETAL: No deformity  SKIN: Warm and dry NEUROLOGIC:  Alert and oriented x 3 PSYCHIATRIC:  Normal affect   ASSESSMENT:    1. Hypertension associated with diabetes ([employer])   2. Family history of early CAD   3. Combined hyperlipidemia associated with type 2 diabetes mellitus ([place])   4. OSA on CPAP   5. Controlled type 2 diabetes mellitus without complication, without long-term current use of insulin ([place])   6. Acute pain of left shoulder    PLAN:    In order of problems listed above:  Excellent control.  She is on Altace 10 mg/day. Follow-up who is a smoker died in his early 43s of an MI. Has developed elevated liver enzymes on statin therapy.  LDL is not too bad on Zetia. Compliant with CPAP A1c was greater than 7.  She is on Jardiance 25 mg/day along with Glucophage and of the therapy. Currently resolved.  Never sounded like it was cardiac related.  Given her risk factors, I have recommended that we have a coronary calcium score to get a better sense of her downstream cardiovascular risk.  If it is a significantly elevated value, we will need to go to PCSK9 or Leqvio for more aggressive lipid lowering.   Medication Adjustments/Labs and Tests Ordered: Current medicines are reviewed at length with the patient today.  Concerns regarding medicines are outlined above.  Orders Placed This Encounter  Procedures   CT CARDIAC SCORING   EKG 12-Lead   No orders of the defined types were placed in this encounter.   Patient Instructions  Medication Instructions:  Your physician recommends that you continue on your current medications as directed. Please refer to the Current  Medication list given to you today.  *If you need a  refill on your cardiac medications before your next appointment, please call your pharmacy*  Lab Work: NONE  Testing/Procedures: Your physician has requested you have a coronary calcium score CT scan performed.  Follow-Up: Will be determined based on results or calcium score.  Important Information About Sugar         Signed, Sinclair Grooms, MD  08/31/2021 12:48 PM    [facility]

## 2021-08-29 NOTE — Telephone Encounter (Signed)
Referred to [facility] in [place] most office long wait pt can call daily for cancellations

## 2021-08-29 NOTE — Addendum Note (Signed)
Addended by: Orland Mustard on: 08/29/2021 12:37 AM   Modules accepted: Orders

## 2021-08-30 ENCOUNTER — Telehealth: Payer: Self-pay

## 2021-08-30 NOTE — Telephone Encounter (Signed)
LMOM for pt to CB in regards to a referral to Dermatology she requested.

## 2021-08-30 NOTE — Telephone Encounter (Signed)
LMOM to CB for update

## 2021-08-31 ENCOUNTER — Ambulatory Visit (INDEPENDENT_AMBULATORY_CARE_PROVIDER_SITE_OTHER): Payer: Medicare Other | Admitting: Interventional Cardiology

## 2021-08-31 ENCOUNTER — Encounter: Payer: Self-pay | Admitting: Interventional Cardiology

## 2021-08-31 VITALS — BP 116/70 | HR 81 | Ht 60.0 in | Wt 193.0 lb

## 2021-08-31 DIAGNOSIS — G4733 Obstructive sleep apnea (adult) (pediatric): Secondary | ICD-10-CM | POA: Diagnosis not present

## 2021-08-31 DIAGNOSIS — Z9989 Dependence on other enabling machines and devices: Secondary | ICD-10-CM | POA: Diagnosis not present

## 2021-08-31 DIAGNOSIS — I152 Hypertension secondary to endocrine disorders: Secondary | ICD-10-CM | POA: Diagnosis not present

## 2021-08-31 DIAGNOSIS — M25512 Pain in left shoulder: Secondary | ICD-10-CM

## 2021-08-31 DIAGNOSIS — E1159 Type 2 diabetes mellitus with other circulatory complications: Secondary | ICD-10-CM

## 2021-08-31 DIAGNOSIS — E119 Type 2 diabetes mellitus without complications: Secondary | ICD-10-CM

## 2021-08-31 DIAGNOSIS — Z8249 Family history of ischemic heart disease and other diseases of the circulatory system: Secondary | ICD-10-CM | POA: Diagnosis not present

## 2021-08-31 DIAGNOSIS — E1169 Type 2 diabetes mellitus with other specified complication: Secondary | ICD-10-CM

## 2021-08-31 DIAGNOSIS — E782 Mixed hyperlipidemia: Secondary | ICD-10-CM

## 2021-08-31 NOTE — Patient Instructions (Addendum)
Medication Instructions:  Your physician recommends that you continue on your current medications as directed. Please refer to the Current Medication list given to you today.  *If you need a refill on your cardiac medications before your next appointment, please call your pharmacy*  Lab Work: NONE  Testing/Procedures: Your physician has requested you have a coronary calcium score CT scan performed.  Follow-Up: Will be determined based on results or calcium score.  Important Information About Sugar

## 2021-09-02 ENCOUNTER — Ambulatory Visit
Admission: RE | Admit: 2021-09-02 | Discharge: 2021-09-02 | Disposition: A | Discharge status: Home / Self Care | Payer: Medicare Other | Source: Ambulatory Visit | Attending: Internal Medicine | Admitting: Internal Medicine

## 2021-09-02 DIAGNOSIS — C50411 Malignant neoplasm of upper-outer quadrant of right female breast: Secondary | ICD-10-CM

## 2021-09-02 DIAGNOSIS — Z853 Personal history of malignant neoplasm of breast: Secondary | ICD-10-CM | POA: Diagnosis not present

## 2021-09-02 DIAGNOSIS — R928 Other abnormal and inconclusive findings on diagnostic imaging of breast: Secondary | ICD-10-CM | POA: Diagnosis not present

## 2021-09-02 HISTORY — DX: Personal history of irradiation: Z92.3

## 2021-09-05 ENCOUNTER — Ambulatory Visit: Payer: Medicare Other | Attending: General Surgery

## 2021-09-05 VITALS — Wt 194.0 lb

## 2021-09-05 DIAGNOSIS — Z483 Aftercare following surgery for neoplasm: Secondary | ICD-10-CM | POA: Insufficient documentation

## 2021-09-05 NOTE — Therapy (Signed)
OUTPATIENT PHYSICAL THERAPY SOZO SCREENING NOTE   Patient Name: Michelle Sherman MRN: [id number] DOB:[birth year], 71 y.o., female Today's Date: 09/05/2021  PCP: McLean-Scocuzza, Nino Glow, MD REFERRING PROVIDER: [name], MD   PT End of Session - 09/05/21 1658     Visit Number 5   # unchanged due to screen only   PT Start Time 1655    PT Stop Time 1700    PT Time Calculation (min) 5 min    Activity Tolerance Patient tolerated treatment well    Behavior During Therapy [facility] for tasks assessed/performed             Past Medical History:  Diagnosis Date   Allergy    Arthritis    Breast cancer ([place]) 08/2019   right breast IDC   Cataract    Complication of anesthesia    unable to void after surgery and had to stay overnight    Diabetes mellitus without complication ([place])    Family history of adverse reaction to anesthesia    sister, hard to wake up    Family history of lung cancer    Family history of pancreatic cancer    GERD (gastroesophageal reflux disease)    Hypertension    Personal history of radiation therapy    Sleep apnea    wears CPAP nightly   Thyroid disease    Ulcerative colitis ([place])    Past Surgical History:  Procedure Laterality Date   BREAST LUMPECTOMY Right 10/2019   BREAST LUMPECTOMY WITH RADIOACTIVE SEED AND SENTINEL LYMPH NODE BIOPSY Right 10/14/2019   Procedure: RIGHT BREAST LUMPECTOMY WITH RADIOACTIVE SEED AND SENTINEL LYMPH NODE BIOPSY;  Surgeon: [name], MD;  Location: [facility];  Service: General;  Laterality: Right;   CHOLECYSTECTOMY     RE-EXCISION OF BREAST LUMPECTOMY Right 11/11/2019   Procedure: RIGHT RE-EXCISION OF BREAST LUMPECTOMY;  Surgeon: [name], MD;  Location: [facility];  Service: General;  Laterality: Right;  RNFA   SKIN SURGERY  12/2015   Fatty tumor removed   [place]   Patient Active Problem List   Diagnosis Date Noted   ANA positive 08/16/2021    Elevated CK 08/15/2021   Hepatotoxicity due to statin drug 01/27/2021   Palpitations 01/27/2021   Hand arthritis 01/27/2021   Drug-induced hepatitis - lipitor 2022 01/07/2021   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   Nonspecific reactive hepatitis 10/28/2020   Genetic testing 10/06/2019   Family history of pancreatic cancer    Family history of lung cancer    Malignant neoplasm of upper-outer quadrant of right breast in female, estrogen receptor positive ([place]) 09/18/2019   Incomplete uterovaginal prolapse 04/03/2019   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   Controlled type 2 diabetes mellitus without complication, without long-term current use of insulin ([place]) 11/01/2016   Hypertension associated with diabetes ([place]) 11/01/2016   Severe obesity (BMI 35.0-35.9 with comorbidity) ([place]) 11/01/2016   Combined hyperlipidemia associated with type 2 diabetes mellitus ([place]) 01/15/2012   Allergic rhinitis 10/05/2011   Ulcerative colitis without complications ([place]) 93/26/7124   Diverticulosis of colon 10/17/2010   Internal hemorrhoids 10/17/2010   Acquired hypothyroidism 10/04/2010   Osteoarthrosis of knee 10/04/2010   OSA on CPAP 06/28/2010    REFERRING DIAG: right breast cancer at risk for lymphedema  THERAPY DIAG: Aftercare following surgery for neoplasm  PERTINENT HISTORY: Patient was diagnosed on 08/22/2019 with 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 She had a right lumpectomy and sentinel node biopsy (2  negative nodes) on 10/14/2019. It is ER/PR positive and HER2 negative with a Ki67 of 5%. She cares for her husband who is in a motorized wheelchair and has bilateral BKA.   PRECAUTIONS: right UE Lymphedema risk, None  SUBJECTIVE: Pt returns for her 3 month L-Dex screen.   PAIN:  Are you having pain? No  SOZO SCREENING: Patient was assessed today using the SOZO machine to determine the lymphedema index score. This was compared to her baseline score. It was determined that she is  within the recommended range when compared to her baseline and no further action is needed at this time. She will continue SOZO screenings. These are done every 3 months for 2 years post operatively followed by every 6 months for 2 years, and then annually.   L-DEX FLOWSHEETS - 09/05/21 1600       L-DEX LYMPHEDEMA SCREENING   Measurement Type Unilateral    L-DEX MEASUREMENT EXTREMITY Upper Extremity    POSITION  Standing    DOMINANT SIDE Right    At Risk Side Right    BASELINE SCORE (UNILATERAL) -2.2    L-DEX SCORE (UNILATERAL) -1.3    VALUE CHANGE (UNILAT) 0.9              [name], PTA 09/05/2021, 4:59 PM

## 2021-09-09 ENCOUNTER — Other Ambulatory Visit (HOSPITAL_COMMUNITY): Payer: Medicare Other

## 2021-09-13 ENCOUNTER — Encounter: Payer: Self-pay | Admitting: Internal Medicine

## 2021-09-14 ENCOUNTER — Other Ambulatory Visit: Payer: Self-pay

## 2021-09-14 DIAGNOSIS — C50411 Malignant neoplasm of upper-outer quadrant of right female breast: Secondary | ICD-10-CM | POA: Diagnosis not present

## 2021-09-14 DIAGNOSIS — Z170 Estrogen receptor positive status [ER+]: Secondary | ICD-10-CM | POA: Diagnosis not present

## 2021-09-14 MED ORDER — METFORMIN HCL 1000 MG PO TABS: 1000.0000 mg | ORAL_TABLET | Freq: Two times a day (BID) | ORAL | 3 refills | Status: DC

## 2021-09-15 ENCOUNTER — Ambulatory Visit (HOSPITAL_COMMUNITY)
Admission: RE | Admit: 2021-09-15 | Discharge: 2021-09-15 | Disposition: A | Discharge status: Home / Self Care | Payer: Medicare Other | Source: Ambulatory Visit | Attending: Interventional Cardiology | Admitting: Interventional Cardiology

## 2021-09-15 DIAGNOSIS — E1169 Type 2 diabetes mellitus with other specified complication: Secondary | ICD-10-CM | POA: Insufficient documentation

## 2021-09-15 DIAGNOSIS — E119 Type 2 diabetes mellitus without complications: Secondary | ICD-10-CM | POA: Insufficient documentation

## 2021-09-15 DIAGNOSIS — I152 Hypertension secondary to endocrine disorders: Secondary | ICD-10-CM | POA: Insufficient documentation

## 2021-09-15 DIAGNOSIS — E1159 Type 2 diabetes mellitus with other circulatory complications: Secondary | ICD-10-CM | POA: Insufficient documentation

## 2021-09-15 DIAGNOSIS — E782 Mixed hyperlipidemia: Secondary | ICD-10-CM | POA: Insufficient documentation

## 2021-09-15 DIAGNOSIS — Z8249 Family history of ischemic heart disease and other diseases of the circulatory system: Secondary | ICD-10-CM | POA: Insufficient documentation

## 2021-09-17 IMAGING — MG MM PLC BREAST LOC DEV 1ST LESION INC*R*
8 of 11 series · 8 of 11 positions shown · non-contrast
Comparison: Previous exam(s).

CLINICAL DATA: Localization of 2 right breast masses for surgery.

EXAM:
MAMMOGRAPHIC GUIDED RADIOACTIVE SEED LOCALIZATION OF THE RIGHT
BREAST

[R CC (1 of 6)]
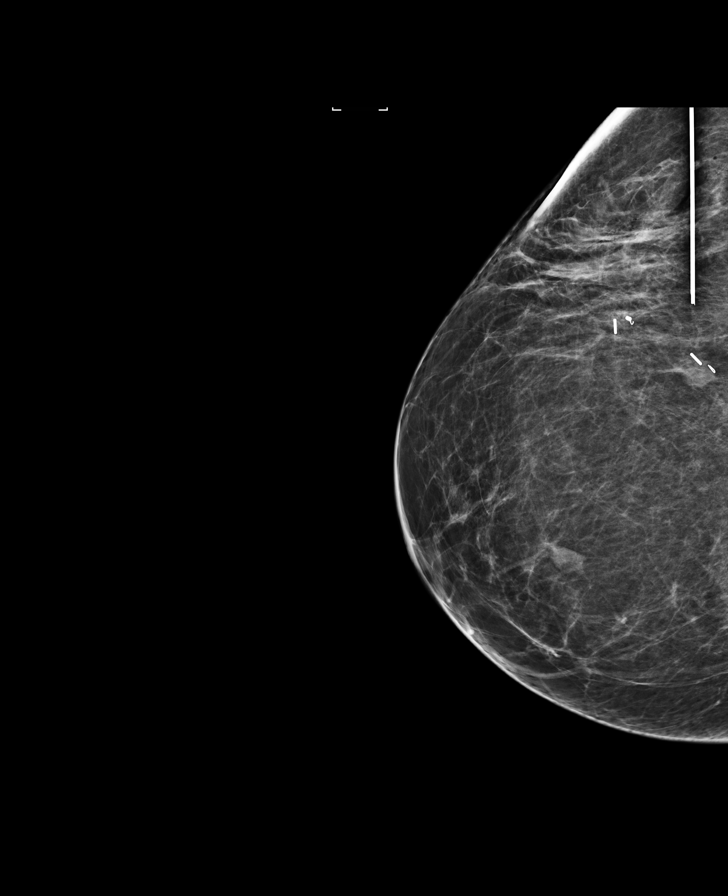

[R LM (1 of 2)]
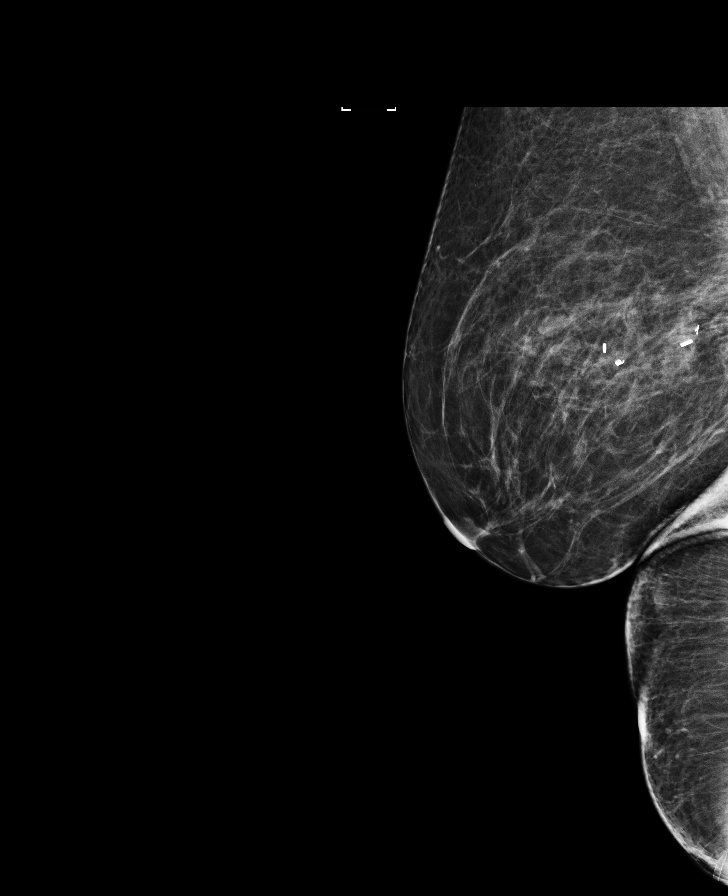

[R CC (2 of 6)]
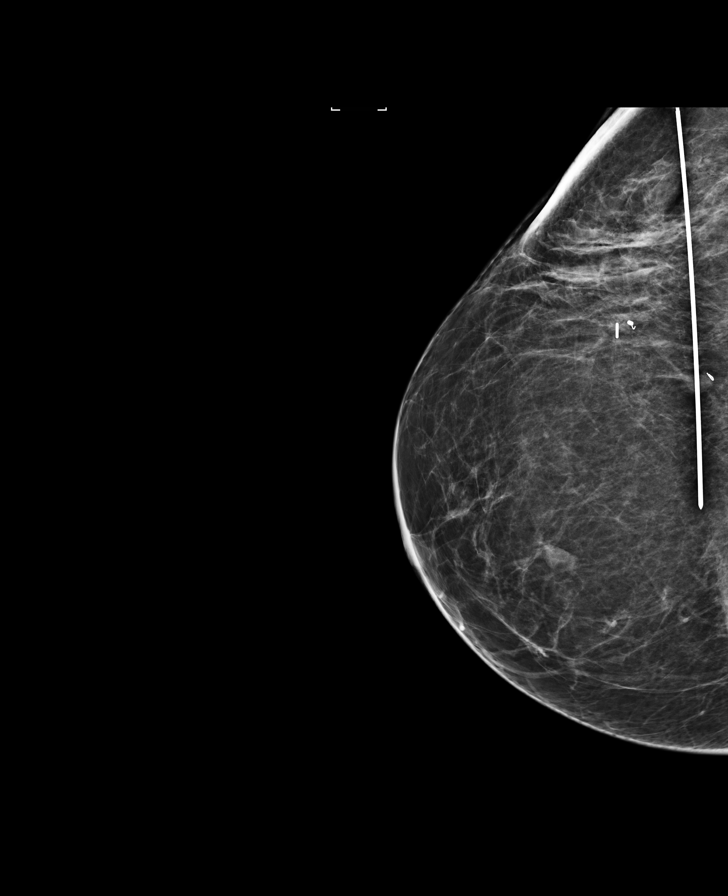

[R CC (3 of 6)]
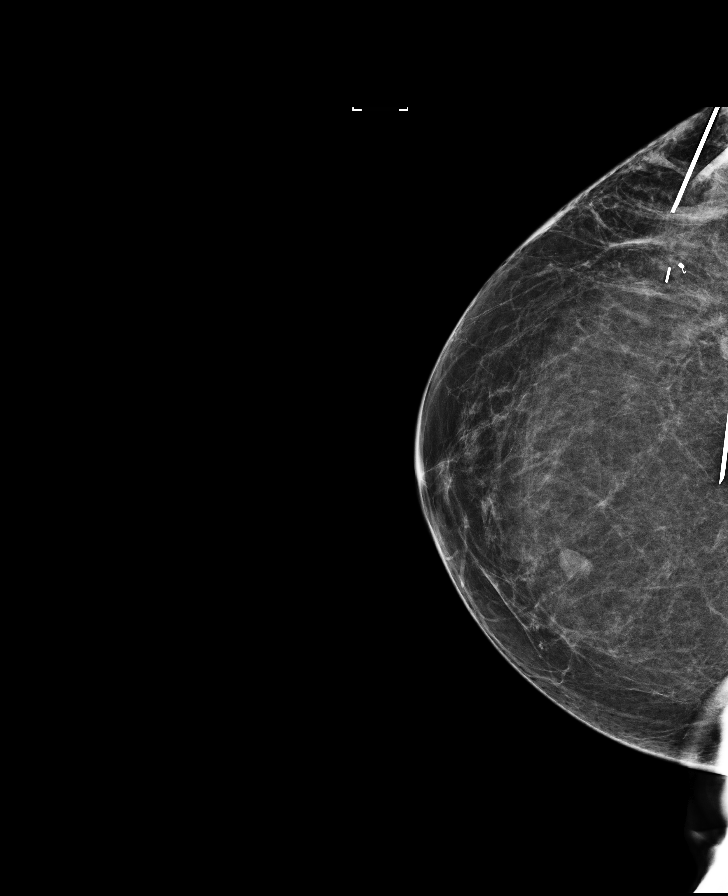

[R CC (4 of 6)]
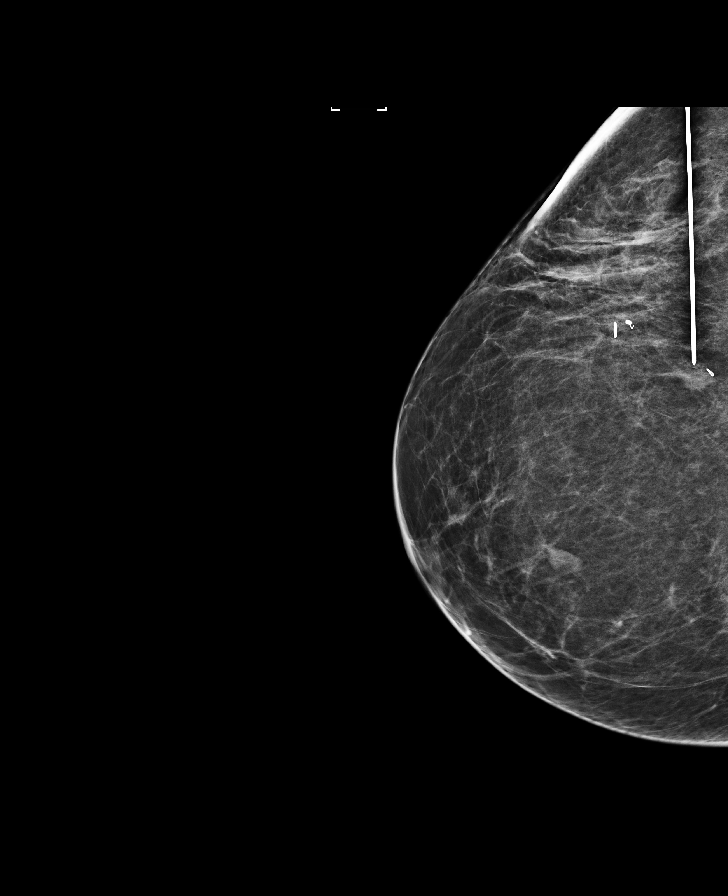

[R LM (2 of 2)]
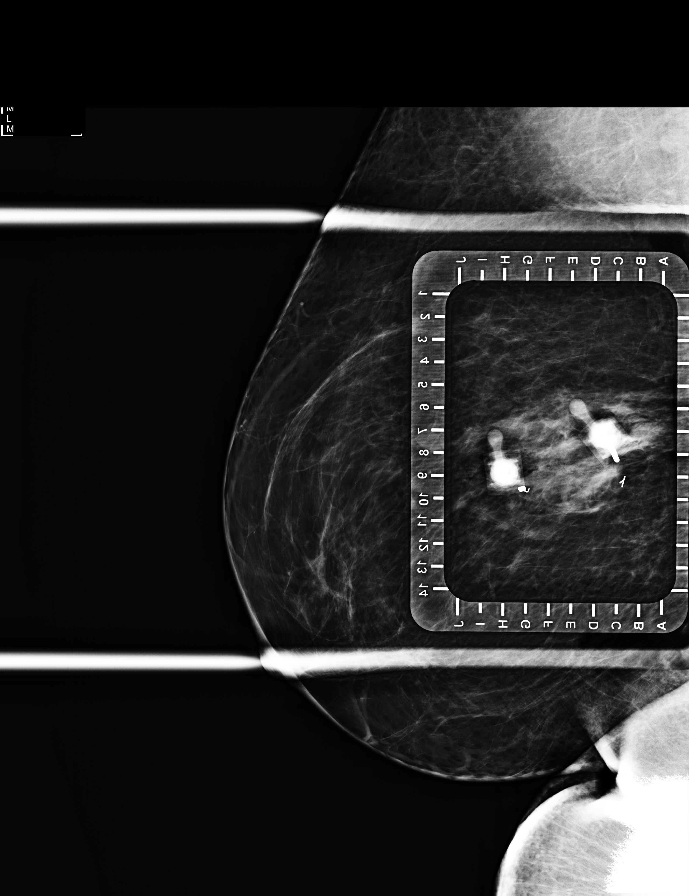

[R CC (5 of 6)]
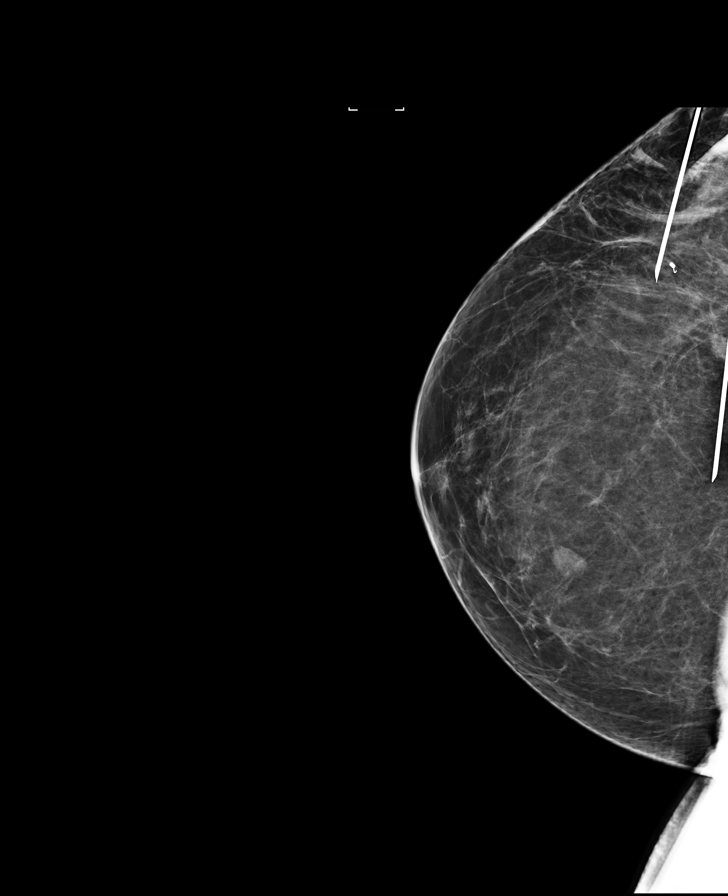

[R CC (6 of 6)]
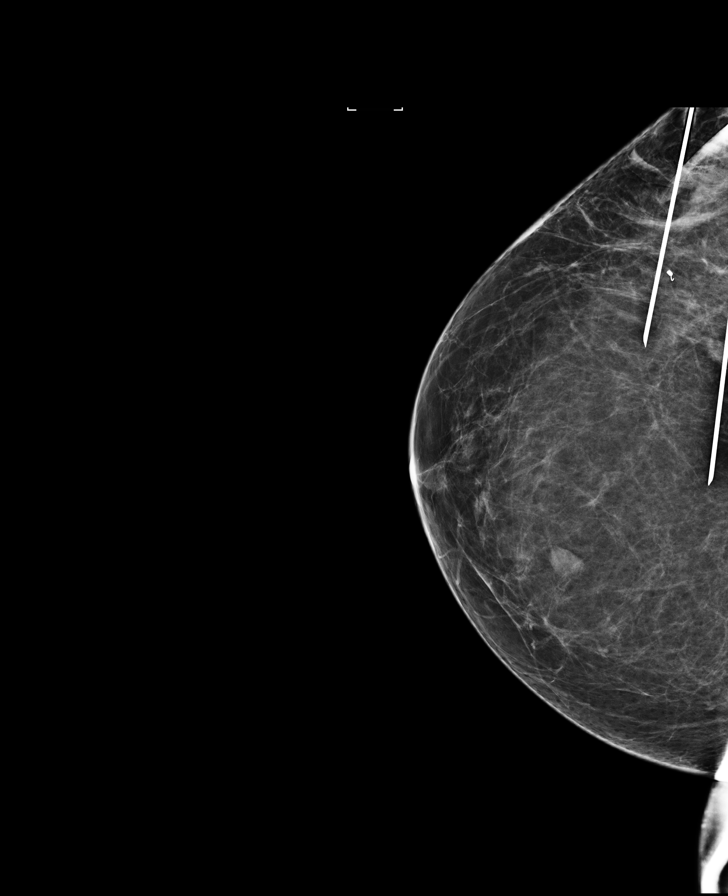

[8 of 11 positions shown; findings below may reference images not displayed]



The usual time-out protocol was performed immediately prior to the
procedure.

Using mammographic guidance, sterile technique, 1% lidocaine and an
O-WJ9 radioactive seed, the posterior mass was localized using a
lateral approach. The follow-up mammogram images confirm the seed in
the expected location and were marked for the surgeon.

Follow-up survey of the patient confirms presence of the radioactive
seed.

Order number of O-WJ9 seed:  [id number].

Total activity:  0.254 millicuries reference Date: August 25, 2019

The patient tolerated the procedure well and was released from the
[REDACTED]. She was given instructions regarding seed removal.

Using mammographic guidance, sterile technique, 1% lidocaine and an
O-WJ9 radioactive seed, the anterior mass was localized using a
lateral approach. The follow-up mammogram images confirm the seed in
the expected location and were marked for the surgeon.

Follow-up survey of the patient confirms presence of the radioactive
seed.

Order number of O-WJ9 seed:  [id number].

Total activity:  0.249 millicuries reference Date: September 23, 2019

The patient tolerated the procedure well and was released from the
[REDACTED]. She was given instructions regarding seed removal.
IMPRESSION: Radioactive seed localizations right breast. No apparent
complications.

## 2021-09-18 IMAGING — MG MM BREAST SURGICAL SPECIMEN
1 series · 2 of 2 positions shown · non-contrast
Comparison: Previous exam(s).

CLINICAL DATA: Status post seed localized RIGHT lumpectomy.

EXAM:
SPECIMEN RADIOGRAPH OF THE RIGHT BREAST

[Series 2: R · right · 0.07mm/px · 2 of 2 slices shown]
[im 1/2]
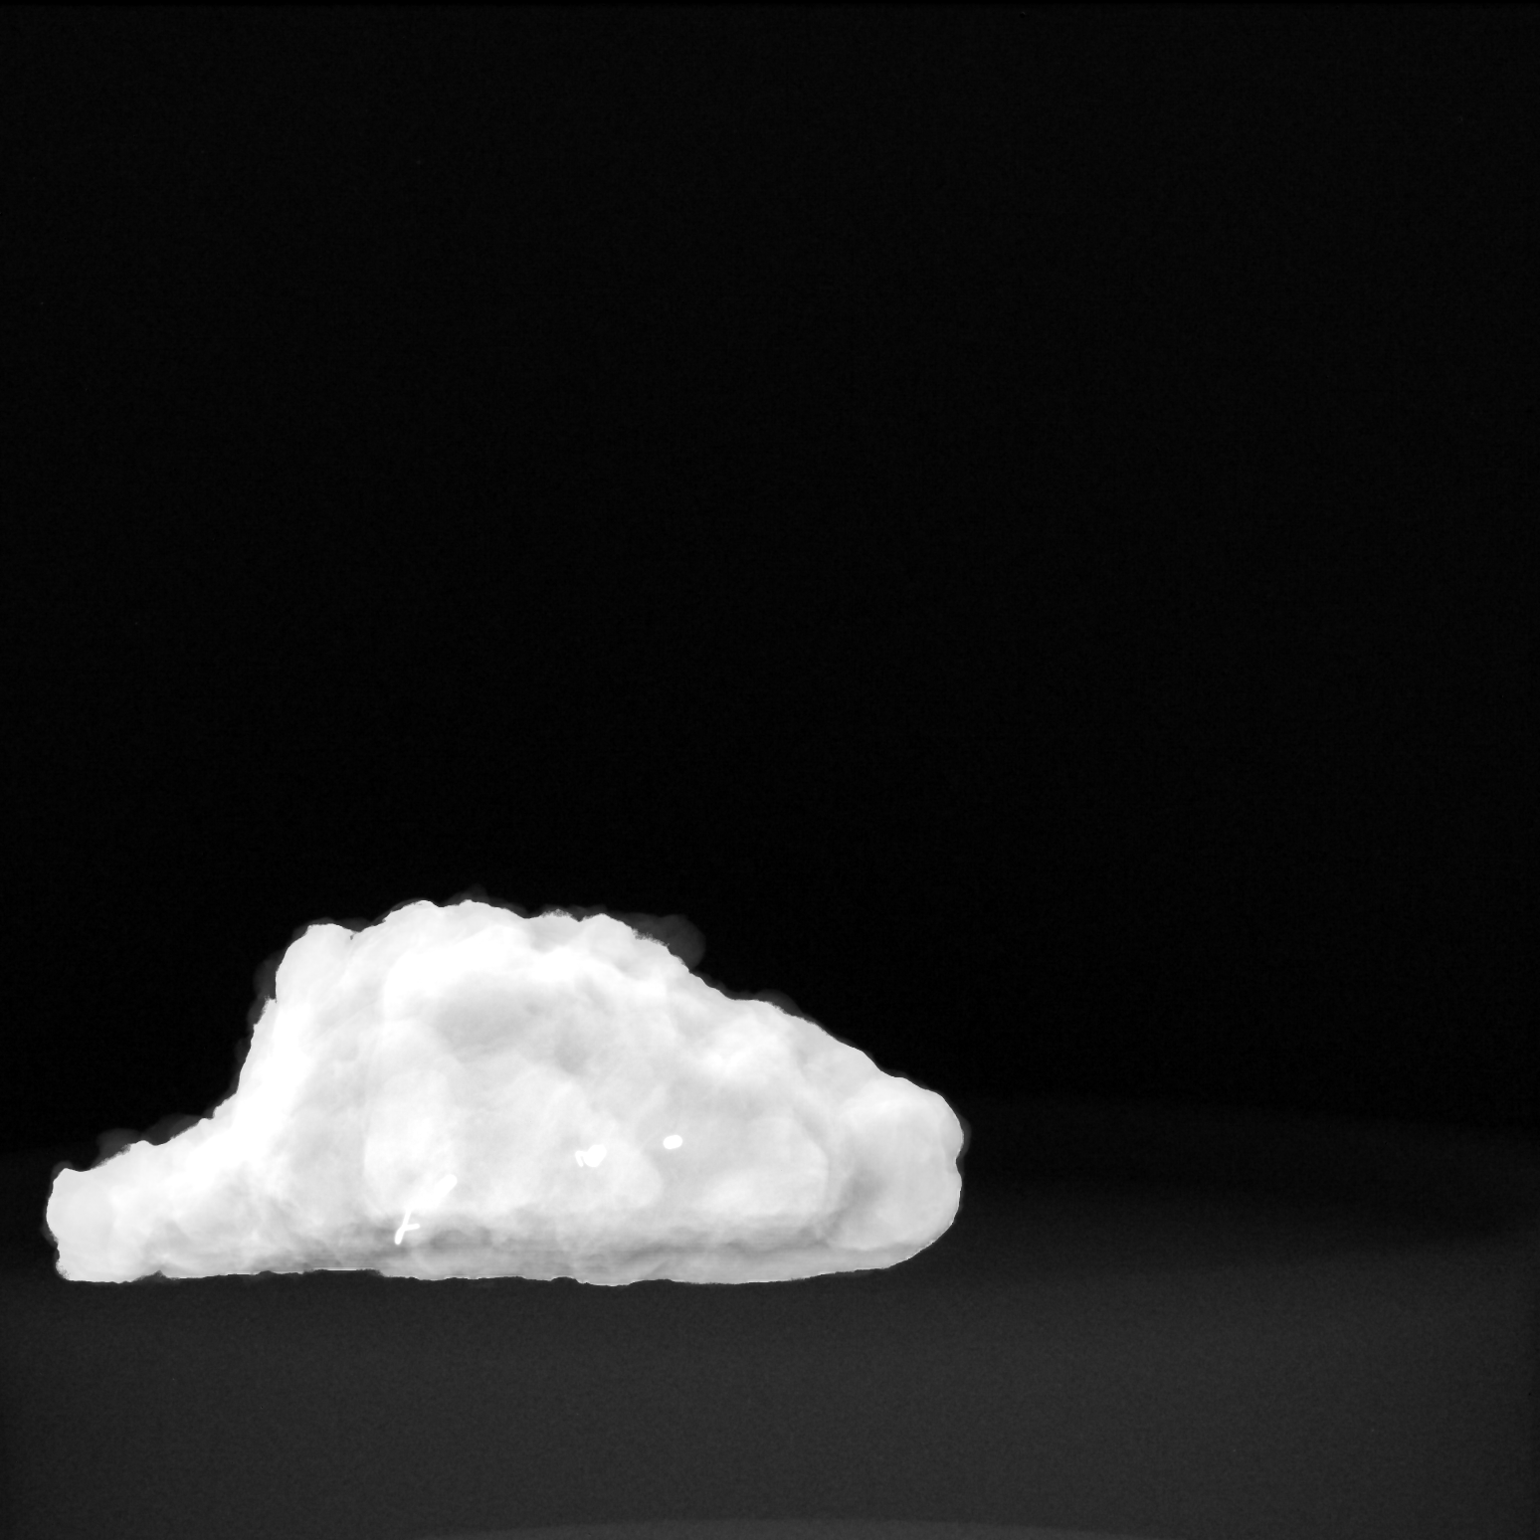
[im 2/2]
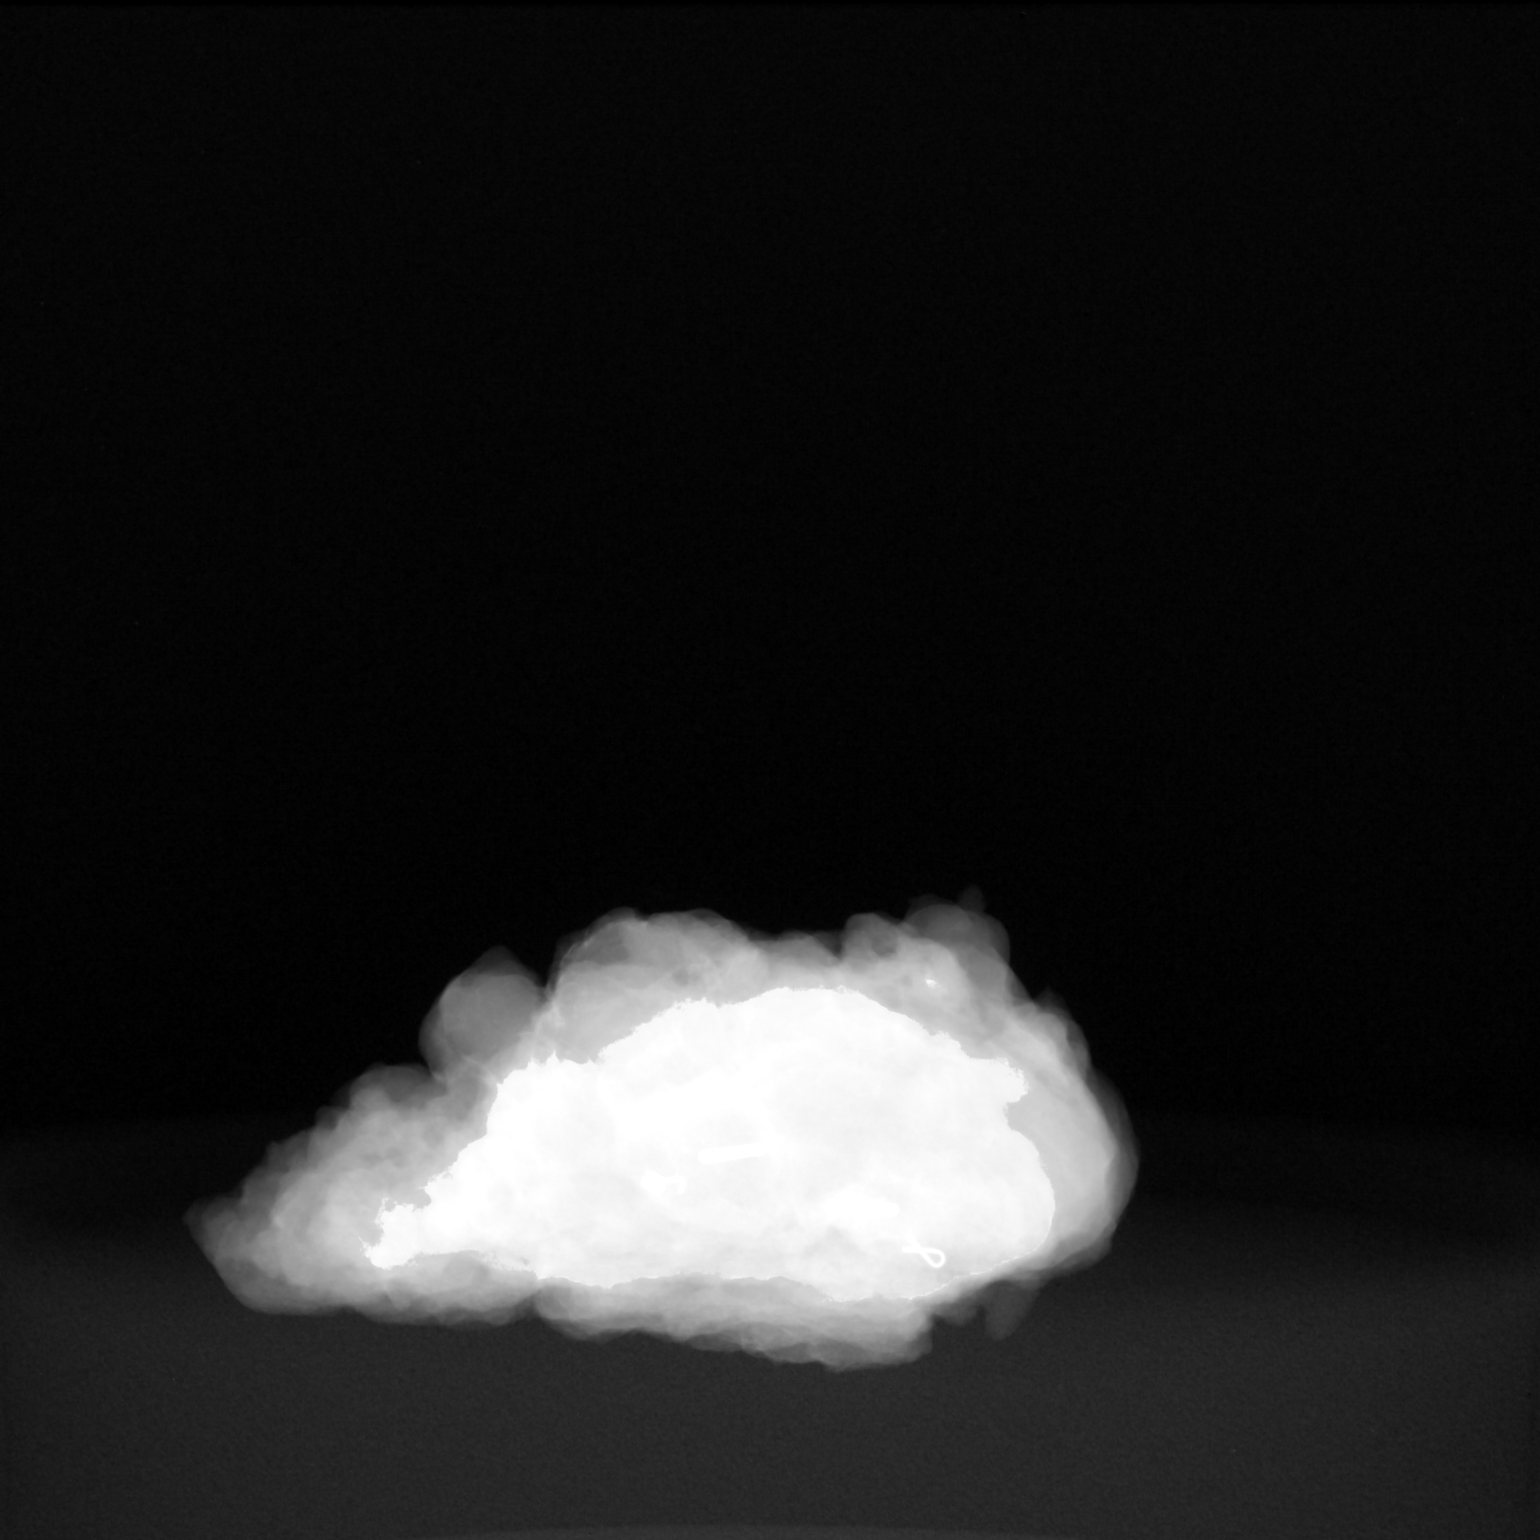

[2 of 2 positions shown; findings below may reference images not displayed]

FINDINGS: Status post excision of the right breast. Two radioactive seeds, a
ribbon shaped clip, and a coil shaped clip are identified within the
specimen. Findings are discussed with the operating room nurse at
the time of interpretation.
IMPRESSION: Specimen radiograph of the right breast.

## 2021-09-19 ENCOUNTER — Other Ambulatory Visit: Payer: Self-pay | Admitting: *Deleted

## 2021-09-19 NOTE — Patient Outreach (Signed)
  Care Coordination   09/19/2021 Name: Michelle Sherman MRN: [id number] DOB: 1950-02-20   Care Coordination Outreach Attempts:  An unsuccessful telephone outreach was attempted today to offer the patient information about available care coordination services as a benefit of their health plan.   Follow Up Plan:  Additional outreach attempts will be made to offer the patient care coordination information and services.   Encounter Outcome:  No Answer  Care Coordination Interventions Activated:  No   Care Coordination Interventions:  No, not indicated    Raina Mina, RN Care Management Coordinator [place] Office [phone]

## 2021-09-21 ENCOUNTER — Telehealth: Payer: Self-pay | Admitting: Interventional Cardiology

## 2021-09-21 DIAGNOSIS — R931 Abnormal findings on diagnostic imaging of heart and coronary circulation: Secondary | ICD-10-CM

## 2021-09-21 NOTE — Telephone Encounter (Signed)
Patient is returning call to discuss CT results. 

## 2021-09-21 NOTE — Telephone Encounter (Signed)
Returned call to patient.  Patient states she is interested in referral to [facility].  Referral placed, scheduler will contact patient to schedule appt.  Patient verbalized understanding and expressed appreciation for call.

## 2021-09-23 DIAGNOSIS — R768 Other specified abnormal immunological findings in serum: Secondary | ICD-10-CM | POA: Diagnosis not present

## 2021-10-02 ENCOUNTER — Other Ambulatory Visit: Payer: Self-pay | Admitting: Nurse Practitioner

## 2021-10-02 DIAGNOSIS — C50411 Malignant neoplasm of upper-outer quadrant of right female breast: Secondary | ICD-10-CM

## 2021-10-13 ENCOUNTER — Ambulatory Visit: Payer: Self-pay

## 2021-10-13 NOTE — Patient Outreach (Signed)
  Care Coordination   Initial Visit Note   10/13/2021 Name: Michelle Sherman MRN: [id number] DOB: 03/28/1950  Michelle Sherman is a 71 y.o. year old female who sees McLean-Scocuzza, Nino Glow, MD for primary care. I spoke with  Michelle Sherman by phone today.  What matters to the patients health and wellness today?  No concerns, doing well at this time.    Goals Addressed             This Visit's Progress    COMPLETED: Care Coordination Activities       Care Coordination Interventions: SDoH screening performed - no acute resource challenges identified at this time Determined the patient does not have concerns with medication costs and/or adherence Discussed the patient has not yet completed an Advance Directive, she does have the forms  Education provided on the role of the care coordination team - no follow up desired at this time Instructed the patient to contact her primary care provider as needed        SDOH assessments and interventions completed:  Yes  SDOH Interventions Today    Flowsheet Row Most Recent Value  SDOH Interventions   Food Insecurity Interventions Intervention Not Indicated  Housing Interventions Intervention Not Indicated  Transportation Interventions Intervention Not Indicated  Utilities Interventions Intervention Not Indicated  Financial Strain Interventions Intervention Not Indicated        Care Coordination Interventions Activated:  Yes  Care Coordination Interventions:  Yes, provided   Follow up plan: No further intervention required.   Encounter Outcome:  Pt. Visit Completed   Daneen Schick, BSW, CDP Social Worker, Certified Dementia Practitioner [place] [facility]  Care Coordination [phone]

## 2021-10-13 NOTE — Patient Instructions (Signed)
Visit Information  Thank you for taking time to visit with me today. Please don't hesitate to contact me if I can be of assistance to you.   Following are the goals we discussed today:   Goals Addressed             This Visit's Progress    COMPLETED: Care Coordination Activities       Care Coordination Interventions: SDoH screening performed - no acute resource challenges identified at this time Determined the patient does not have concerns with medication costs and/or adherence Discussed the patient has not yet completed an Advance Directive, she does have the forms  Education provided on the role of the care coordination team - no follow up desired at this time Instructed the patient to contact her primary care provider as needed        Please call the care guide team at [phone] if you need to schedule an appointment with our care coordination team.  If you are experiencing a Mental Health or [place] or need someone to talk to, please call [phone] (toll free, 24 hour hotline)  Patient verbalizes understanding of instructions and care plan provided today and agrees to view in [place]. Active MyChart status and patient understanding of how to access instructions and care plan via MyChart confirmed with patient.     No further follow up required: Please contact your primary care provider as needed.  Daneen Schick, BSW, CDP Social Worker, Certified Dementia Practitioner [place] [facility]  Care Coordination [phone]

## 2021-10-24 DIAGNOSIS — C4361 Malignant melanoma of right upper limb, including shoulder: Secondary | ICD-10-CM | POA: Diagnosis not present

## 2021-11-01 DIAGNOSIS — D485 Neoplasm of uncertain behavior of skin: Secondary | ICD-10-CM | POA: Diagnosis not present

## 2021-11-10 DIAGNOSIS — C4361 Malignant melanoma of right upper limb, including shoulder: Secondary | ICD-10-CM | POA: Diagnosis not present

## 2021-11-11 ENCOUNTER — Ambulatory Visit: Payer: Medicare Other | Attending: Cardiology | Admitting: Pharmacist

## 2021-11-11 DIAGNOSIS — E782 Mixed hyperlipidemia: Secondary | ICD-10-CM

## 2021-11-11 DIAGNOSIS — E1169 Type 2 diabetes mellitus with other specified complication: Secondary | ICD-10-CM | POA: Diagnosis not present

## 2021-11-11 DIAGNOSIS — K719 Toxic liver disease, unspecified: Secondary | ICD-10-CM | POA: Diagnosis not present

## 2021-11-11 DIAGNOSIS — T466X5A Adverse effect of antihyperlipidemic and antiarteriosclerotic drugs, initial encounter: Secondary | ICD-10-CM | POA: Diagnosis not present

## 2021-11-11 NOTE — Progress Notes (Unsigned)
Patient ID: Michelle Sherman                 DOB: 11-08-1950                    MRN: [id number]     HPI: Michelle Sherman is a 71 y.o. female patient referred to lipid clinic by Dr Tamala Julian. PMH is significant for breast cancer, T2DM, CPAP, HLD, coronary calcifications, and recently diagnosed malignent melanoma. Has surgical removal scheduled for next week. Is not able to take statins due to elevated LFTs.   Patient presents today to discuss cholesterol management. Receives medications from [facility]. 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 Under more stress since recent diagnosis of skin cancer with plans for surgery next week.  Has previously tried atorvastatin however suffered hepatotoxicity with elevated LFTs and CK.  Currently managed on Zetia without adverse effects.   Current Medications:  Ezetimibe 46m daily  Intolerances:  Statins  Risk Factors:  T2DM CAD Coronary calcium  LDL goal: <55  Labs: TC 162, Trigs 125, HDL 59.9, LDL 77 (08/11/21 on Zetia)  Coronary calcium score of 145. This was 75rd percentile for age and sex matched control.  Past Medical History:  Diagnosis Date   Allergy    Arthritis    [place]   right breast IDC   Cataract    Complication of anesthesia    unable to void after surgery and had to stay overnight    Diabetes mellitus without complication ([facility]    Family history of adverse reaction to anesthesia    sister, hard to wake up    Family history of lung cancer    Family history of pancreatic cancer    GERD (gastroesophageal reflux disease)    Hypertension    Personal history of radiation therapy    Sleep apnea    wears CPAP nightly   Thyroid disease    [place]cerative [place]     Current Outpatient Medications on File Prior to Visit  Medication Sig Dispense Refill   fluticasone (FLONASE) 50 MCG/ACT nasal spray Place into the nose.     anastrozole (ARIMIDEX) 1 MG tablet TAKE ONE TABLET BY MOUTH ONE TIME  DAILY 90 tablet 0   aspirin EC 81 MG tablet Take 1 tablet by mouth daily.     Biotin 2500 MCG CAPS Take 1 capsule by mouth daily.     Cholecalciferol (VITAMIN D3) 2000 units capsule Take 1 capsule by mouth daily.     clobetasol cream (TEMOVATE) 01.01% Apply 1 Application topically 2 (two) times daily. Right arm x 2 bid prn 45 g 0   empagliflozin (JARDIANCE) 25 MG TABS tablet Take 1 tablet (25 mg total) by mouth daily before breakfast. (Patient taking differently: Take 25 mg by mouth daily before breakfast. Has not been delivered. 08/18/21 [facility]) 90 tablet 3   ezetimibe (ZETIA) 10 MG tablet Take 1 tablet (10 mg total) by mouth daily. 90 tablet 3   famotidine (PEPCID) 40 MG tablet Take 40 mg by mouth 2 (two) times daily.     fexofenadine (ALLEGRA) 180 MG tablet Take 180 mg by mouth daily.     levothyroxine (SYNTHROID) 112 MCG tablet Take 1 tablet (112 mcg total) by mouth daily. 90 tablet 3   mesalamine (LIALDA) 1.2 g EC tablet Take by mouth. Take 2 tablet in am and 2 tablet in pm     metFORMIN (GLUCOPHAGE) 1000 MG tablet Take  1 tablet (1,000 mg total) by mouth 2 (two) times daily with a meal. 180 tablet 3   Misc Natural Products (TURMERIC CURCUMIN) CAPS Take 1 capsule by mouth daily.     Multiple Vitamin (MULTIVITAMIN) tablet Take 1 tablet by mouth daily.     ramipril (ALTACE) 10 MG capsule Take 1 capsule (10 mg total) by mouth daily. 90 capsule 3   traZODone (DESYREL) 50 MG tablet Take 0.5-1 tablets (25-50 mg total) by mouth at bedtime as needed for sleep. 90 tablet 3   No current facility-administered medications on file prior to visit.    Allergies  Allergen Reactions   Nickel Other (See Comments) and Rash    drainage   Sulfa Antibiotics Rash   Lipitor [Atorvastatin] Other (See Comments)    Hepatitis and myopathy, lfts 100s and elevated CK with symptoms   Bydureon [Exenatide] Nausea And Vomiting    Assessment/Plan:  1. Hyperlipidemia - Patient LDL 77 which is above goal of <55.   Aggressive goal chosen due to T2DM and elevated coronary calcium score.Since patient is contraindicated to statins, recommend starting PCSK9i and patient is agreeable.  Using demo pen, educated patient on mechanism of action, storage, site selection, administration, and possible adverse effects. Patient has a Medicare part D plan however she receives medications through [facility]. Will see if Repatha or Praluent is preferred and submit PA.  Recheck lipid panel in 2-3 months. Maybe be able to d/c zetia at that time.  Continue Zetia 76m daily Start Repatha/Prauent sq  q 14 days Recheck lipid panel in 2-3 months  CKarren Cobble PharmD, BCACP, [id number] [place] [address]Phone: [phone] Fax: [phone]/22/2023 11:29 AM    [phone]

## 2021-11-11 NOTE — Patient Instructions (Addendum)
It was nice meeting you today  We would like your LDL (bad cholesterol) to be less than 55  We would like to start a new medication called Repatha or Praluent. I will see which one [facility]A prefers.  I will complete the prior authorization for you and contact you when it is approved  Once you begin the medication we would like to recheck your lipid panel in 2-3 months.  You can have this done at any [facility]  Please call with any questions and good luck with surgery next week  Karren Cobble, PharmD, [place], [place], [place], [place], [place], [address] Phone: [phone], Fax: [phone]

## 2021-11-13 ENCOUNTER — Encounter: Payer: Self-pay | Admitting: Pharmacist

## 2021-11-15 DIAGNOSIS — I10 Essential (primary) hypertension: Secondary | ICD-10-CM | POA: Diagnosis not present

## 2021-11-15 DIAGNOSIS — G4733 Obstructive sleep apnea (adult) (pediatric): Secondary | ICD-10-CM | POA: Diagnosis not present

## 2021-11-18 DIAGNOSIS — Z9089 Acquired absence of other organs: Secondary | ICD-10-CM | POA: Diagnosis not present

## 2021-11-18 DIAGNOSIS — E785 Hyperlipidemia, unspecified: Secondary | ICD-10-CM | POA: Diagnosis not present

## 2021-11-18 DIAGNOSIS — Z9989 Dependence on other enabling machines and devices: Secondary | ICD-10-CM | POA: Diagnosis not present

## 2021-11-18 DIAGNOSIS — Z9889 Other specified postprocedural states: Secondary | ICD-10-CM | POA: Diagnosis not present

## 2021-11-18 DIAGNOSIS — M1990 Unspecified osteoarthritis, unspecified site: Secondary | ICD-10-CM | POA: Diagnosis not present

## 2021-11-18 DIAGNOSIS — Z79899 Other long term (current) drug therapy: Secondary | ICD-10-CM | POA: Diagnosis not present

## 2021-11-18 DIAGNOSIS — G4730 Sleep apnea, unspecified: Secondary | ICD-10-CM | POA: Diagnosis not present

## 2021-11-18 DIAGNOSIS — D0361 Melanoma in situ of right upper limb, including shoulder: Secondary | ICD-10-CM | POA: Diagnosis not present

## 2021-11-18 DIAGNOSIS — E119 Type 2 diabetes mellitus without complications: Secondary | ICD-10-CM | POA: Diagnosis not present

## 2021-11-18 DIAGNOSIS — Z888 Allergy status to other drugs, medicaments and biological substances status: Secondary | ICD-10-CM | POA: Diagnosis not present

## 2021-11-18 DIAGNOSIS — C4361 Malignant melanoma of right upper limb, including shoulder: Secondary | ICD-10-CM | POA: Diagnosis not present

## 2021-11-18 DIAGNOSIS — I10 Essential (primary) hypertension: Secondary | ICD-10-CM | POA: Diagnosis not present

## 2021-11-18 DIAGNOSIS — K219 Gastro-esophageal reflux disease without esophagitis: Secondary | ICD-10-CM | POA: Diagnosis not present

## 2021-11-18 DIAGNOSIS — Z86000 Personal history of in-situ neoplasm of breast: Secondary | ICD-10-CM | POA: Diagnosis not present

## 2021-11-18 DIAGNOSIS — Z7982 Long term (current) use of aspirin: Secondary | ICD-10-CM | POA: Diagnosis not present

## 2021-11-18 DIAGNOSIS — E039 Hypothyroidism, unspecified: Secondary | ICD-10-CM | POA: Diagnosis not present

## 2021-11-18 DIAGNOSIS — Z882 Allergy status to sulfonamides status: Secondary | ICD-10-CM | POA: Diagnosis not present

## 2021-11-18 DIAGNOSIS — Z7984 Long term (current) use of oral hypoglycemic drugs: Secondary | ICD-10-CM | POA: Diagnosis not present

## 2021-11-18 DIAGNOSIS — Z923 Personal history of irradiation: Secondary | ICD-10-CM | POA: Diagnosis not present

## 2021-11-18 DIAGNOSIS — Z9049 Acquired absence of other specified parts of digestive tract: Secondary | ICD-10-CM | POA: Diagnosis not present

## 2021-11-29 ENCOUNTER — Encounter: Payer: Self-pay | Admitting: Pharmacist

## 2021-12-01 DIAGNOSIS — Z09 Encounter for follow-up examination after completed treatment for conditions other than malignant neoplasm: Secondary | ICD-10-CM | POA: Diagnosis not present

## 2021-12-01 DIAGNOSIS — C4361 Malignant melanoma of right upper limb, including shoulder: Secondary | ICD-10-CM | POA: Diagnosis not present

## 2021-12-06 ENCOUNTER — Telehealth: Payer: Self-pay | Admitting: Pharmacist

## 2021-12-06 DIAGNOSIS — G720 Drug-induced myopathy: Secondary | ICD-10-CM

## 2021-12-06 DIAGNOSIS — E1169 Type 2 diabetes mellitus with other specified complication: Secondary | ICD-10-CM

## 2021-12-06 MED ORDER — REPATHA SURECLICK 140 MG/ML ~~LOC~~ SOAJ: 1.0000 mL | SUBCUTANEOUS | 3 refills | Status: DC

## 2021-12-06 NOTE — Telephone Encounter (Signed)
Called [facility]A who said a Letter of Medical Necessity must be provided for Repatha

## 2021-12-07 ENCOUNTER — Inpatient Hospital Stay: Payer: Medicare Other

## 2021-12-07 ENCOUNTER — Inpatient Hospital Stay: Payer: Medicare Other | Admitting: Hematology

## 2021-12-13 ENCOUNTER — Encounter: Payer: Self-pay | Admitting: Family Medicine

## 2021-12-13 ENCOUNTER — Ambulatory Visit (INDEPENDENT_AMBULATORY_CARE_PROVIDER_SITE_OTHER): Payer: Medicare Other | Admitting: Family Medicine

## 2021-12-13 VITALS — BP 118/70 | HR 72 | Temp 98.5°F | Ht 60.0 in | Wt 187.2 lb

## 2021-12-13 DIAGNOSIS — E1169 Type 2 diabetes mellitus with other specified complication: Secondary | ICD-10-CM

## 2021-12-13 DIAGNOSIS — Z0000 Encounter for general adult medical examination without abnormal findings: Secondary | ICD-10-CM

## 2021-12-13 DIAGNOSIS — E1159 Type 2 diabetes mellitus with other circulatory complications: Secondary | ICD-10-CM | POA: Diagnosis not present

## 2021-12-13 DIAGNOSIS — Z23 Encounter for immunization: Secondary | ICD-10-CM | POA: Diagnosis not present

## 2021-12-13 DIAGNOSIS — R21 Rash and other nonspecific skin eruption: Secondary | ICD-10-CM | POA: Diagnosis not present

## 2021-12-13 DIAGNOSIS — K5190 Ulcerative colitis, unspecified, without complications: Secondary | ICD-10-CM | POA: Diagnosis not present

## 2021-12-13 DIAGNOSIS — E782 Mixed hyperlipidemia: Secondary | ICD-10-CM

## 2021-12-13 DIAGNOSIS — E039 Hypothyroidism, unspecified: Secondary | ICD-10-CM | POA: Diagnosis not present

## 2021-12-13 DIAGNOSIS — E119 Type 2 diabetes mellitus without complications: Secondary | ICD-10-CM | POA: Diagnosis not present

## 2021-12-13 DIAGNOSIS — E669 Obesity, unspecified: Secondary | ICD-10-CM | POA: Diagnosis not present

## 2021-12-13 DIAGNOSIS — I152 Hypertension secondary to endocrine disorders: Secondary | ICD-10-CM | POA: Diagnosis not present

## 2021-12-13 LAB — COMPREHENSIVE METABOLIC PANEL
ALT: 14 U/L (ref 0–35)
AST: 13 U/L (ref 0–37)
Albumin: 4.5 g/dL (ref 3.5–5.2)
Alkaline Phosphatase: 87 U/L (ref 39–117)
BUN: 16 mg/dL (ref 6–23)
CO2: 29 mEq/L (ref 19–32)
Calcium: 10.3 mg/dL (ref 8.4–10.5)
Chloride: 100 mEq/L (ref 96–112)
Creatinine, Ser: 0.58 mg/dL (ref 0.40–1.20)
GFR: 90.46 mL/min (ref >60.00)
Glucose, Bld: 122 mg/dL — ABNORMAL HIGH (ref 70–99)
Potassium: 4.4 mEq/L (ref 3.5–5.1)
Sodium: 138 mEq/L (ref 135–145)
Total Bilirubin: 0.3 mg/dL (ref 0.2–1.2)
Total Protein: 7.1 g/dL (ref 6.0–8.3)

## 2021-12-13 LAB — LIPID PANEL
Cholesterol: 211 mg/dL — ABNORMAL HIGH (ref 0–200)
HDL: 72.7 mg/dL (ref >39.00)
NonHDL: 138.09
Total CHOL/HDL Ratio: 3
Triglycerides: 239 mg/dL — ABNORMAL HIGH (ref 0.0–149.0)
VLDL: 47.8 mg/dL — ABNORMAL HIGH (ref 0.0–40.0)

## 2021-12-13 LAB — VITAMIN B12: Vitamin B-12: 331 pg/mL (ref 211–911)

## 2021-12-13 LAB — CBC WITH DIFFERENTIAL/PLATELET
Basophils Absolute: 0.1 10*3/uL (ref 0.0–0.1)
Basophils Relative: 1.4 % (ref 0.0–3.0)
Eosinophils Absolute: 0.2 10*3/uL (ref 0.0–0.7)
Eosinophils Relative: 2.8 % (ref 0.0–5.0)
HCT: 44.5 % (ref 36.0–46.0)
Hemoglobin: 14.6 g/dL (ref 12.0–15.0)
Lymphocytes Relative: 27.4 % (ref 12.0–46.0)
Lymphs Abs: 1.8 10*3/uL (ref 0.7–4.0)
MCHC: 32.9 g/dL (ref 30.0–36.0)
MCV: 90.4 fl (ref 78.0–100.0)
Monocytes Absolute: 0.4 10*3/uL (ref 0.1–1.0)
Monocytes Relative: 7 % (ref 3.0–12.0)
Neutro Abs: 3.9 10*3/uL (ref 1.4–7.7)
Neutrophils Relative %: 61.4 % (ref 43.0–77.0)
Platelets: 363 10*3/uL (ref 150.0–400.0)
RBC: 4.92 Mil/uL (ref 3.87–5.11)
RDW: 14.1 % (ref 11.5–15.5)
WBC: 6.4 10*3/uL (ref 4.0–10.5)

## 2021-12-13 LAB — HEMOGLOBIN A1C: Hgb A1c MFr Bld: 7.2 % — ABNORMAL HIGH (ref 4.6–6.5)

## 2021-12-13 LAB — LDL CHOLESTEROL, DIRECT: Direct LDL: 110 mg/dL

## 2021-12-13 LAB — VITAMIN D 25 HYDROXY (VIT D DEFICIENCY, FRACTURES): VITD: 64.93 ng/mL (ref 30.00–100.00)

## 2021-12-13 LAB — TSH: TSH: 1.29 u[IU]/mL (ref 0.35–5.50)

## 2021-12-13 NOTE — Patient Instructions (Addendum)
It was a pleasure meeting you today. Thank you for allowing me to take part in your health care.  Our goals for today as we discussed include:  We will get some labs today.  If they are abnormal or we need to do something about them, I will call you.  If they are normal, I will send you a message on MyChart (if it is active) or a letter in the mail.  If you don't hear from [place] in 2 weeks, please call the office at the number below.   For your rash Use Aveeno eczema frequently Follow up with Dermatology Patient presents to clinic to transfer care  If you have any questions or concerns, please do not hesitate to call the office at [phone].  I look forward to our next visit and until then take care and stay safe.  Regards,   Carollee Leitz, MD   [facility]

## 2021-12-13 NOTE — Progress Notes (Signed)
SUBJECTIVE:   Chief Complaint  Patient presents with   Transitions Of Care    Rash on arm/ lump at surgical site.    HPI Patient presents clinic transfer care.  Hypertension Asymptomatic.  Takes ramipril 10 mg daily, and tolerating well.  Diabetes type 2 Asymptomatic.  Currently taking metformin 1000 mg twice daily and Jardiance 25 mg daily.  Tolerating medications well.  Hyperlipidemia Follows with Dr. Tamala Julian.  Takes Repatha 140 mg injections biweekly.  Also on Zetia 10 mg daily.  History of statin myopathy  Hypothyroidism Asymptomatic.  Currently on levothyroxine 112 mcg daily and tolerating medication well.  ER positive breast CA Follows with Dr. Annamaria Boots.  Currently on Arimidex 1 mg daily.  History of melanoma Follows with dermatology.  Reports rash and mild itching on her right arm.  Denies any pain, fevers, swelling or redness.  Endorses slightly improved.  No change in lotions, detergents or environmental changes.  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  Constitutional:      General: She is not in acute distress.    Appearance: She is not ill-appearing.  HENT:     Head: Normocephalic.     Nose: Nose normal.  Eyes:     Conjunctiva/sclera: Conjunctivae normal.  Cardiovascular:     Rate and Rhythm: Normal rate and regular rhythm.     Heart sounds: Normal heart sounds.  Pulmonary:     Effort: Pulmonary effort is normal.     Breath sounds: Normal breath sounds.  Abdominal:     General: Abdomen is flat. Bowel sounds are normal.     Palpations: Abdomen is soft.   Musculoskeletal:        General: Normal range of motion.     Cervical back: Normal range of motion.  Skin:    General: Skin is warm and dry.     Findings: Rash present. No erythema, lesion or petechiae. Rash is not crusting, macular, papular, purpuric, pustular, urticarial or vesicular.  Neurological:     Mental Status: She is alert and oriented to person, place, and time. Mental status is at baseline.  Psychiatric:        Mood and Affect: Mood normal.        Behavior: Behavior normal.        Thought Content: Thought content normal.        Judgment: Judgment normal.     ASSESSMENT/PLAN:  Hypertension associated with diabetes ([place]) Assessment & Plan: Well-controlled. Continue ramipril 10 mg daily Follow-up 6 months  Orders: -     Comprehensive metabolic panel; Future  Annual physical exam  Need for immunization against influenza -     Flu Vaccine QUAD High Dose(Fluad)  Acquired hypothyroidism Assessment & Plan: Normal TSH at last annual. Repeat TSH  Continue levothyroxine 112 mcg daily   Orders: -     TSH; Future  Combined hyperlipidemia associated with type 2 diabetes mellitus (HCC) Assessment & Plan: Follows with Dr. Tamala Julian at lipid clinic. Currently on Repatha 140 mg injectable biweekly Continue Zetia 10  mg daily Recheck lipids   Orders: -     Lipid panel; Future -     LDL cholesterol, direct  Controlled type 2 diabetes mellitus without complication, without long-term current use of insulin (HCC) Assessment & Plan: A1c increased from previous visit. Repeat A1c Continue metformin 1000 mg twice daily Continue Jardiance 25 mg daily Consider GLP-1 at next visit.  Orders: -     Hemoglobin A1c; Future  Obesity, Class II, BMI 35-39.9, isolated (see actual BMI) Assessment & Plan: BMI 36.83.  History of type 2 diabetes, hypertension, hyperlipidemia. Encouraged healthy lifestyle and increased activity.  Orders: -     VITAMIN D 25 Hydroxy (Vit-D  Deficiency, Fractures); Future -     Vitamin B12; Future  Ulcerative colitis without complications, unspecified location [facility]) Assessment & Plan: Well-controlled.  Reports taking mesalamine 1.2 g 2 tablets twice daily.  No documentation found in care everywhere or epic of last prescription filled. Will readdress with patient at next visit.  Orders: -     CBC with Differential/Platelet; Future  Rash Assessment & Plan: Low suspicion for cellulitis, DVT given no edema, increased erythema, calor. Follow-up with dermatology given history of malignancy. Can use Aveeno eczema until seen by dermatology.    PDMP reviewed  Return in about 3 months (around 03/15/2022) for annual visit with fasting labs 1 week prior.  Carollee Leitz, MD

## 2021-12-19 NOTE — Progress Notes (Unsigned)
[place]   [phone] Fax:(336) [phone]   Clinic Follow up Note   Patient Care Team: Carollee Leitz, MD as PCP - General (Family Medicine) Deneise Lever, MD as Consulting Physician (Pulmonary Disease) Juanita Craver, MD as Consulting Physician (Gastroenterology) Lyndee Hensen, PT as Physical Therapist (Physical Therapy) Trula Slade, DPM as Consulting Physician (Podiatry) Katy Apo, MD as Consulting Physician (Ophthalmology) Rockwell Germany, RN as Oncology Nurse Navigator Mauro Kaufmann, RN as Oncology Nurse Navigator [name], MD as Consulting Physician (General Surgery) Truitt Merle, MD as Consulting Physician (Hematology) Kyung Rudd, MD as Consulting Physician (Radiation Oncology) Alla Feeling, NP as Nurse Practitioner (Nurse Practitioner) Madelin Rear, [facility] (Inactive) as Pharmacist (Pharmacist) Date of service 12/20/2021  CHIEF COMPLAINT: Follow up right breast cancer   SUMMARY OF ONCOLOGIC HISTORY: Oncology History Overview Note  Cancer Staging Malignant neoplasm of upper-outer quadrant of right breast in female, estrogen receptor positive ([place]) Staging form: Breast, AJCC 8th Edition - Clinical stage from 09/12/2019: Stage IA (cT1b, cN0, cM0, G1, ER+, PR+, HER2-) - Signed by Truitt Merle, MD on 09/23/2019    Malignant neoplasm of upper-outer quadrant of right breast in female, estrogen receptor positive ([place])  09/05/2019 Mammogram   IMPRESSION: Indeterminate 9 x 6 x 6 mm mass involving the OUTER RIGHT breast at the 9 o'clock position approximately 5 cm from the nipple at POSTERIOR depth, located anterior to a normal appearing intramammary lymph Node.      09/12/2019 Cancer Staging   Staging form: Breast, AJCC 8th Edition - Clinical stage from 09/12/2019: Stage IA (cT1b, cN0, cM0, G1, ER+, PR+, HER2-) - Signed by Truitt Merle, MD on 09/23/2019   09/12/2019 Initial Biopsy   Diagnosis 1. Breast, right, needle core biopsy, 9 o'clock  posterior - INVASIVE DUCTAL CARCINOMA WITH PAPILLARY FEATURES. 2. Breast, right, needle core biopsy, 9 o'clock anterior - INVASIVE DUCTAL CARCINOMA WITH PAPILLARY FEATURES. Microscopic Comment 1. - 2. The specimens share similar morphologic features. E-cadherin is positive and CK5/6 is negative. P63, Calponin and SMM-1 demonstrate the absence of myoepithelium. Ancillary studies will be reported separately. Results reported to [facility] on 09/15/2019. Intradepartmental consultation (Dr. Tresa Moore).   09/12/2019 Receptors her2   1. PROGNOSTIC INDICATORS Results: IMMUNOHISTOCHEMICAL AND MORPHOMETRIC ANALYSIS PERFORMED MANUALLY The tumor cells are NEGATIVE for Her2 (1+). Estrogen Receptor: 99%, POSITIVE, STRONG STAINING INTENSITY Progesterone Receptor: 99%, POSITIVE, STRONG STAINING INTENSITY Proliferation Marker Ki67: 5%   09/18/2019 Initial Diagnosis   Malignant neoplasm of upper-outer quadrant of right breast in female, estrogen receptor positive ([place])   10/05/2019 Genetic Testing   Negative genetic testing:  No pathogenic variants detected on the [facility] Breast Cancer STAT Panel + Common Hereditary Cancers Panel. 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 The report date is 10/05/2019.  The Breast Cancer STAT Panel offered by [facility] includes sequencing and deletion/duplication analysis for the following 9 genes:  ATM, BRCA1, BRCA2, CDH1, CHEK2, PALB2, PTEN, STK11 and TP53. The Common Hereditary Cancers Panel offered by [facility] includes sequencing and/or deletion duplication testing of the following 48 genes: APC, ATM, AXIN2, BARD1, BMPR1A, BRCA1, BRCA2, BRIP1, CDH1, CDK4, CDKN2A (p14ARF), CDKN2A (p16INK4a), CHEK2, CTNNA1, DICER1, EPCAM (Deletion/duplication testing only), GREM1 (promoter region deletion/duplication testing only), KIT, MEN1, MLH1, MSH2, MSH3, MSH6, MUTYH, NBN, NF1, NTHL1, PALB2, PDGFRA, PMS2, POLD1, POLE, PTEN,  RAD50, RAD51C, RAD51D, RNF43, SDHB, SDHC, SDHD, SMAD4, SMARCA4. STK11, TP53, TSC1, TSC2, and VHL.  The following genes were evaluated  for sequence changes only: SDHA and HOXB13 c.251G>A variant only.    10/14/2019 Pathology Results   RIGHT BREAST LUMPECTOMY WITH RADIOACTIVE SEED AND SENTINEL LYMPH NODE BIOPSY by Dr Barry Dienes    FINAL MICROSCOPIC DIAGNOSIS:   A. BREAST, RIGHT, LUMPECTOMY:  - Multifocal invasive ductal carcinoma with papillary features, grade 2,  spanning 0.9 cm and 0.5 cm.  - Intermediate grade ductal carcinoma in situ.  - Invasive carcinoma present at original inferior margin broadly, final  inferior margin (Part F) is negative.  - Margins are negative for in situ carcinoma.  - Biopsy site.  - See oncology table.   B. BREAST, RIGHT ADDITIONAL POSTERIOR MARGIN, EXCISION:  - Benign breast tissue.   C. BREAST, RIGHT ADDITIONAL LATERAL MARGIN, EXCISION:  - Benign breast tissue.   D. BREAST, RIGHT ADDITIONAL SUPERIOR MARGIN, EXCISION:  - Benign breast tissue.   E. BREAST, RIGHT ADDITIONAL MEDIAL MARGIN, EXCISION:  - Invasive ductal carcinoma with papillary features, grade 2, spanning  0.5 cm.  - Invasive carcinoma is present at the new medial margin broadly.   F. BREAST, RIGHT ADDITIONAL INFERIOR MARGIN, EXCISION:  - Benign breast tissue.   G. LYMPH NODE, RIGHT AXILLARY #1, SENTINEL, EXCISION:  - One of one lymph nodes negative for carcinoma (0/1).   H. LYMPH NODE, RIGHT AXILLARY #2, SENTINEL, EXCISION:  - One of one lymph nodes negative for carcinoma (0/1).   10/14/2019 Oncotype testing   Oncotype  Recurrence Score 0 with distant recurrence risk at 9 years of 3%.  There is less then 1% benefit of chemotherapy   10/14/2019 Cancer Staging   Staging form: Breast, AJCC 8th Edition - Pathologic stage from 10/14/2019: Stage IA (pT1b, pN0, cM0, G2, ER+, PR+, HER2-, Oncotype DX score: 0) - Signed by Truitt Merle, MD on 01/29/2020   11/11/2019 Pathology Results   RIGHT  RE-EXCISION OF BREAST LUMPECTOMY by Dr Barry Dienes    FINAL MICROSCOPIC DIAGNOSIS:   A. BREAST, RIGHT, RE-EXCISION OF MEDIAL MARGIN:  - Prior procedure site changes.  - No carcinoma identified.   COMMENT:   P63, Calponin and SMM-1 demonstrate the presence of myoepithelium in the  select focus.    12/22/2019 - 01/19/2020 Radiation Therapy   Adjuvant Radiation with Dr Lisbeth Renshaw    05/06/2020 Survivorship   SCP delivered by Cira Rue, NP      CURRENT THERAPY: Adjuvant Anastrozole, started 01/2020  INTERVAL HISTORY: Ms. Catlin returns for follow up as scheduled, last seen by me 06/07/21.  Mammogram 09/02/2021 showed improving but still large seroma at the lumpectomy site, otherwise negative.  She turned out to have melanoma on her right forearm and underwent wide local excision and SLNB surgery last month, they used the axillary incision from her previous lumpectomy. Since surgery she feels a "knot" at the axillary incision.  Denies new masses in her breast, nipple discharge, or other skin change.  She has recovered well and continues anastrozole, tolerating well with rare hot flash and stable joint pain, mainly in her hands.  All other systems were reviewed with the patient and are negative.  MEDICAL HISTORY:  Past Medical History:  Diagnosis Date   Allergy    Arthritis    Breast cancer ([place]) 08/2019   right breast IDC   Cataract    Complication of anesthesia    unable to void after surgery and had to stay overnight    Diabetes mellitus without complication [facility])    Family history of adverse reaction to anesthesia    sister, hard  to wake up    Family history of lung cancer    Family history of pancreatic cancer    GERD (gastroesophageal reflux disease)    Hypertension    Personal history of radiation therapy    Sleep apnea    wears CPAP nightly   Thyroid disease    Ulcerative colitis ([place])     SURGICAL HISTORY: Past Surgical History:  Procedure Laterality Date   BREAST  LUMPECTOMY Right 10/2019   BREAST LUMPECTOMY WITH RADIOACTIVE SEED AND SENTINEL LYMPH NODE BIOPSY Right 10/14/2019   Procedure: RIGHT BREAST LUMPECTOMY WITH RADIOACTIVE SEED AND SENTINEL LYMPH NODE BIOPSY;  Surgeon: [name], MD;  Location: [place];  Service: General;  Laterality: Right;   CHOLECYSTECTOMY     RE-EXCISION OF BREAST LUMPECTOMY Right 11/11/2019   Procedure: RIGHT RE-EXCISION OF BREAST LUMPECTOMY;  Surgeon: [name], MD;  Location: [place];  Service: General;  Laterality: Right;  RNFA   SKIN SURGERY  12/2015   Fatty tumor removed   [place]    I have reviewed the social history and family history with the patient and they are unchanged from previous note.  ALLERGIES:  is allergic to nickel, sulfa antibiotics, lipitor [atorvastatin], and bydureon [exenatide].  MEDICATIONS:  Current Outpatient Medications  Medication Sig Dispense Refill   anastrozole (ARIMIDEX) 1 MG tablet TAKE ONE TABLET BY MOUTH ONE TIME DAILY 90 tablet 0   aspirin EC 81 MG tablet Take 1 tablet by mouth daily.     Biotin 2500 MCG CAPS Take 1 capsule by mouth daily.     Cholecalciferol (VITAMIN D3) 2000 units capsule Take 1 capsule by mouth daily.     clobetasol cream (TEMOVATE) 4.25 % Apply 1 Application topically 2 (two) times daily. Right arm x 2 bid prn 45 g 0   empagliflozin (JARDIANCE) 25 MG TABS tablet Take 1 tablet (25 mg total) by mouth daily before breakfast. (Patient taking differently: Take 25 mg by mouth daily before breakfast. Has not been delivered. 08/18/21 HEI) 90 tablet 3   Evolocumab (REPATHA SURECLICK) 956 MG/ML SOAJ Inject 140 mg into the skin every 14 (fourteen) days. 6 mL 3   ezetimibe (ZETIA) 10 MG tablet Take 1 tablet (10 mg total) by mouth daily. 90 tablet 3   famotidine (PEPCID) 40 MG tablet Take 40 mg by mouth 2 (two) times daily.     fexofenadine (ALLEGRA) 180 MG tablet Take 180 mg by mouth daily.      fluticasone (FLONASE) 50 MCG/ACT nasal spray Place into the nose.     levothyroxine (SYNTHROID) 112 MCG tablet Take 1 tablet (112 mcg total) by mouth daily. 90 tablet 3   mesalamine (LIALDA) 1.2 g EC tablet Take by mouth. Take 2 tablet in am and 2 tablet in pm     metFORMIN (GLUCOPHAGE) 1000 MG tablet Take 1 tablet (1,000 mg total) by mouth 2 (two) times daily with a meal. 180 tablet 3   Misc Natural Products (TURMERIC CURCUMIN) CAPS Take 1 capsule by mouth daily.     Multiple Vitamin (MULTIVITAMIN) tablet Take 1 tablet by mouth daily.     ramipril (ALTACE) 10 MG capsule Take 1 capsule (10 mg total) by mouth daily. 90 capsule 3   traZODone (DESYREL) 50 MG tablet Take 0.5-1 tablets (25-50 mg total) by mouth at bedtime as needed for sleep. 90 tablet 3   No current facility-administered medications for this visit.    PHYSICAL EXAMINATION: ECOG PERFORMANCE STATUS:  0 - Asymptomatic  Vitals:   12/20/21 1201  BP: 139/66  Pulse: 74  Resp: 18  Temp: 98 F (36.7 C)  SpO2: 98%   Filed Weights   12/20/21 1201  Weight: 188 lb 5 oz (85.4 kg)    GENERAL:alert, no distress and comfortable SKIN: No rash.  S/p right forearm incision well-healed with tiny area of granulation tissue EYES:  sclera clear NECK: Without mass LYMPH:  no palpable focal or supraclavicular lymphadenopathy LUNGS:  normal breathing effort HEART:  no lower extremity edema ABDOMEN:abdomen soft, non-tender and normal bowel sounds NEURO: alert & oriented x 3 with fluent speech, no focal motor/sensory deficits Breast exam: Right breast slightly larger, no bilateral nipple discharge or inversion.  Right breast seroma noted without palpable mass or nodularity in either breast or left axilla that I could appreciate.  Right axillary incision closed, healing well.  Superior to the incision is a 3 x 2 cm raised/edematous area with blanching erythema.  No significant warmth or discrete mass  LABORATORY DATA:  I have reviewed the data  as listed    Latest Ref Rng & Units 12/20/2021   11:39 AM 12/13/2021    1:59 PM 06/07/2021   10:11 AM  CBC  WBC 4.0 - 10.5 K/uL 6.8  6.4  5.5   Hemoglobin 12.0 - 15.0 g/dL 14.8  14.6  14.8   Hematocrit 36.0 - 46.0 % 45.3  44.5  45.4   Platelets 150 - 400 K/uL 288  363.0  304         Latest Ref Rng & Units 12/20/2021   11:39 AM 12/13/2021    1:59 PM 06/07/2021   10:11 AM  CMP  Glucose 70 - 99 mg/dL 158  122  191   BUN 8 - 23 mg/dL _0 Creatinine 0.44 - 1.00 mg/dL 0.66  0.58  0.70   Sodium 135 - 145 mmol/L 140  138  139   Potassium 3.5 - 5.1 mmol/L 4.4  4.4  3.6   Chloride 98 - 111 mmol/L 105  100  103   CO2 22 - 32 mmol/L _1 Calcium 8.9 - 10.3 mg/dL 10.8  10.3  9.7   Total Protein 6.5 - 8.1 g/dL 7.6  7.1  7.3   Total Bilirubin 0.3 - 1.2 mg/dL 0.4  0.3  0.4   Alkaline Phos 38 - 126 U/L 87  87  87   AST 15 - 41 U/L _2 ALT 0 - 44 U/L _3 RADIOGRAPHIC STUDIES: I have personally reviewed the radiological images as listed and agreed with the findings in the report. No results found.   ASSESSMENT & PLAN: Michelle Sherman is a 71 y.o. female with    1. Malignant neoplasm of upper-outer quadrant of right breast, invasive ductal carcinoma, Stage 1A, pT1bN0M0, ER+/PR+/HER2-, Grade 2, 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 -S/p right lumpectomy with Dr Barry Dienes on 10/14/19 and right breast re-excision on 11/11/19 due to positive margins. Oncotype showed RS 0, low risk. -she completed adjuvant Radiation 12/22/19-01/19/20 -Se began adjuvant anastrozole in 01/2020. She is tolerating well overall, except rare hot flash and moderate joint pain mainly in her hands today.  This is likely multifactorial, but also related to AI. Tolerable overall, she does not want to try a different AI for now. -Ms.  Hollenback is clinically doing well from a breast cancer standpoint. Exam shows known seroma and post-op recovery from SLNB  from melanoma surgery. Mammogram 08/2021 showed improving seroma and no evidence of malignancy. Labs are unremarkable except Calcium 10.8 (10.1 - 10.7 in the past). I encouraged her to reduce extra dietary calcium and hydrate.  -Overall no clinical concern for breast cancer recurrence.  -Continue surveillance and AI   2. Right forearm melanoma, pT2b -S/p wide local excision and SLNB (used ax incision from lumpectomy) on 11/18/21 by [facility] -dorsal forearm incision has healed well, she has mild blanching erythema at the right axillary incision. Unclear timeline.  -CBC normal which is reassuring -we discussed possible post op recover vs cellulitis. I prefer to hold on antibiotics for now -She will return for close f/up in 1 week for recheck. She knows to call sooner if she has worsening/spreading erythema, warmth, fever, chills, etc   3. Arthritis, joint pain, trigger finger -she has arthritis at baseline, especially in her right knee. -Pain little worse in her hands, but manageable.  -we reviewed that her symptoms can be exacerbated by weight gain and anastrozole.    4. Genetic Testing -Based on her family history of cancer, she is eligible for genetic testing.  -Her genetic testing was negative   5. Comorbidities: HTN, DM, Hypothyroidism -Well controlled on Medications. -Her weight initially increased on anastrozole, but has lost a few lbs in past 3 months. Will continue to monitor. -Continue to f/u with PCP for management   6. Bone Health -Her 07/01/20 DEXA was normal (+1.1). Will repeat every 2 years on AI.   PLAN: -Derm path and today's labs reviewed -Wound check in 1 week, or sooner with s/sx of cellulitis  -Continue breast cancer surveillance and AI -Surveillance visit in 6 months    All questions were answered. The patient knows to call the clinic with any problems, questions or concerns. No barriers to learning was detected. I spent 20 minutes counseling the patient face to  face. The total time spent in the appointment was 30 minutes and more than 50% was on counseling and review of test results     Alla Feeling, NP 12/21/21

## 2021-12-20 ENCOUNTER — Inpatient Hospital Stay: Payer: Medicare Other | Attending: Nurse Practitioner

## 2021-12-20 ENCOUNTER — Other Ambulatory Visit: Payer: Self-pay

## 2021-12-20 ENCOUNTER — Inpatient Hospital Stay (HOSPITAL_BASED_OUTPATIENT_CLINIC_OR_DEPARTMENT_OTHER): Payer: Medicare Other | Admitting: Nurse Practitioner

## 2021-12-20 VITALS — BP 139/66 | HR 74 | Temp 98.0°F | Resp 18 | Wt 188.3 lb

## 2021-12-20 DIAGNOSIS — E039 Hypothyroidism, unspecified: Secondary | ICD-10-CM | POA: Diagnosis not present

## 2021-12-20 DIAGNOSIS — Z800 Family history of malignant neoplasm of digestive organs: Secondary | ICD-10-CM | POA: Insufficient documentation

## 2021-12-20 DIAGNOSIS — E1136 Type 2 diabetes mellitus with diabetic cataract: Secondary | ICD-10-CM | POA: Insufficient documentation

## 2021-12-20 DIAGNOSIS — Z801 Family history of malignant neoplasm of trachea, bronchus and lung: Secondary | ICD-10-CM | POA: Diagnosis not present

## 2021-12-20 DIAGNOSIS — Z808 Family history of malignant neoplasm of other organs or systems: Secondary | ICD-10-CM | POA: Diagnosis not present

## 2021-12-20 DIAGNOSIS — Z923 Personal history of irradiation: Secondary | ICD-10-CM | POA: Insufficient documentation

## 2021-12-20 DIAGNOSIS — Z79811 Long term (current) use of aromatase inhibitors: Secondary | ICD-10-CM | POA: Insufficient documentation

## 2021-12-20 DIAGNOSIS — C4361 Malignant melanoma of right upper limb, including shoulder: Secondary | ICD-10-CM | POA: Diagnosis not present

## 2021-12-20 DIAGNOSIS — M2550 Pain in unspecified joint: Secondary | ICD-10-CM | POA: Insufficient documentation

## 2021-12-20 DIAGNOSIS — Z170 Estrogen receptor positive status [ER+]: Secondary | ICD-10-CM

## 2021-12-20 DIAGNOSIS — C50411 Malignant neoplasm of upper-outer quadrant of right female breast: Secondary | ICD-10-CM | POA: Diagnosis not present

## 2021-12-20 DIAGNOSIS — M1990 Unspecified osteoarthritis, unspecified site: Secondary | ICD-10-CM | POA: Insufficient documentation

## 2021-12-20 DIAGNOSIS — I10 Essential (primary) hypertension: Secondary | ICD-10-CM | POA: Diagnosis not present

## 2021-12-20 LAB — CMP (CANCER CENTER ONLY)
ALT: 13 U/L (ref 0–44)
AST: 11 U/L — ABNORMAL LOW (ref 15–41)
Albumin: 4.5 g/dL (ref 3.5–5.0)
Alkaline Phosphatase: 87 U/L (ref 38–126)
Anion gap: 5 (ref 5–15)
BUN: 17 mg/dL (ref 8–23)
CO2: 30 mmol/L (ref 22–32)
Calcium: 10.8 mg/dL — ABNORMAL HIGH (ref 8.9–10.3)
Chloride: 105 mmol/L (ref 98–111)
Creatinine: 0.66 mg/dL (ref 0.44–1.00)
GFR, Estimated: >60 mL/min
Glucose, Bld: 158 mg/dL — ABNORMAL HIGH (ref 70–99)
Potassium: 4.4 mmol/L (ref 3.5–5.1)
Sodium: 140 mmol/L (ref 135–145)
Total Bilirubin: 0.4 mg/dL (ref 0.3–1.2)
Total Protein: 7.6 g/dL (ref 6.5–8.1)

## 2021-12-20 LAB — CBC WITH DIFFERENTIAL (CANCER CENTER ONLY)
Abs Immature Granulocytes: 0.04 K/uL (ref 0.00–0.07)
Basophils Absolute: 0.1 K/uL (ref 0.0–0.1)
Basophils Relative: 1 %
Eosinophils Absolute: 0.2 K/uL (ref 0.0–0.5)
Eosinophils Relative: 3 %
HCT: 45.3 % (ref 36.0–46.0)
Hemoglobin: 14.8 g/dL (ref 12.0–15.0)
Immature Granulocytes: 1 %
Lymphocytes Relative: 23 %
Lymphs Abs: 1.6 K/uL (ref 0.7–4.0)
MCH: 30.3 pg (ref 26.0–34.0)
MCHC: 32.7 g/dL (ref 30.0–36.0)
MCV: 92.8 fL (ref 80.0–100.0)
Monocytes Absolute: 0.5 K/uL (ref 0.1–1.0)
Monocytes Relative: 7 %
Neutro Abs: 4.4 K/uL (ref 1.7–7.7)
Neutrophils Relative %: 65 %
Platelet Count: 288 K/uL (ref 150–400)
RBC: 4.88 MIL/uL (ref 3.87–5.11)
RDW: 13.3 % (ref 11.5–15.5)
WBC Count: 6.8 K/uL (ref 4.0–10.5)
nRBC: 0 % (ref 0.0–0.2)

## 2021-12-21 ENCOUNTER — Encounter: Payer: Self-pay | Admitting: Nurse Practitioner

## 2021-12-22 DIAGNOSIS — E119 Type 2 diabetes mellitus without complications: Secondary | ICD-10-CM | POA: Diagnosis not present

## 2021-12-22 DIAGNOSIS — H5213 Myopia, bilateral: Secondary | ICD-10-CM | POA: Diagnosis not present

## 2021-12-22 DIAGNOSIS — H2513 Age-related nuclear cataract, bilateral: Secondary | ICD-10-CM | POA: Diagnosis not present

## 2021-12-22 LAB — HM DIABETES EYE EXAM

## 2021-12-25 ENCOUNTER — Other Ambulatory Visit: Payer: Self-pay | Admitting: Nurse Practitioner

## 2021-12-25 DIAGNOSIS — C50411 Malignant neoplasm of upper-outer quadrant of right female breast: Secondary | ICD-10-CM

## 2021-12-27 ENCOUNTER — Inpatient Hospital Stay: Payer: Medicare Other | Attending: Nurse Practitioner | Admitting: Nurse Practitioner

## 2021-12-27 ENCOUNTER — Other Ambulatory Visit: Payer: Self-pay

## 2021-12-27 ENCOUNTER — Encounter: Payer: Self-pay | Admitting: Nurse Practitioner

## 2021-12-27 VITALS — BP 137/72 | HR 82 | Temp 98.1°F | Resp 18 | Ht 60.0 in | Wt 188.6 lb

## 2021-12-27 DIAGNOSIS — E079 Disorder of thyroid, unspecified: Secondary | ICD-10-CM | POA: Insufficient documentation

## 2021-12-27 DIAGNOSIS — G4730 Sleep apnea, unspecified: Secondary | ICD-10-CM | POA: Diagnosis not present

## 2021-12-27 DIAGNOSIS — Z923 Personal history of irradiation: Secondary | ICD-10-CM | POA: Diagnosis not present

## 2021-12-27 DIAGNOSIS — C50411 Malignant neoplasm of upper-outer quadrant of right female breast: Secondary | ICD-10-CM | POA: Diagnosis not present

## 2021-12-27 DIAGNOSIS — Z79890 Hormone replacement therapy (postmenopausal): Secondary | ICD-10-CM | POA: Diagnosis not present

## 2021-12-27 DIAGNOSIS — Z79811 Long term (current) use of aromatase inhibitors: Secondary | ICD-10-CM | POA: Insufficient documentation

## 2021-12-27 DIAGNOSIS — Z7982 Long term (current) use of aspirin: Secondary | ICD-10-CM | POA: Diagnosis not present

## 2021-12-27 DIAGNOSIS — Z801 Family history of malignant neoplasm of trachea, bronchus and lung: Secondary | ICD-10-CM | POA: Diagnosis not present

## 2021-12-27 DIAGNOSIS — I10 Essential (primary) hypertension: Secondary | ICD-10-CM | POA: Diagnosis not present

## 2021-12-27 DIAGNOSIS — E119 Type 2 diabetes mellitus without complications: Secondary | ICD-10-CM | POA: Diagnosis not present

## 2021-12-27 DIAGNOSIS — Z5189 Encounter for other specified aftercare: Secondary | ICD-10-CM | POA: Diagnosis not present

## 2021-12-27 DIAGNOSIS — C4361 Malignant melanoma of right upper limb, including shoulder: Secondary | ICD-10-CM | POA: Insufficient documentation

## 2021-12-27 DIAGNOSIS — Z800 Family history of malignant neoplasm of digestive organs: Secondary | ICD-10-CM | POA: Insufficient documentation

## 2021-12-27 DIAGNOSIS — K219 Gastro-esophageal reflux disease without esophagitis: Secondary | ICD-10-CM | POA: Diagnosis not present

## 2021-12-27 DIAGNOSIS — Z7984 Long term (current) use of oral hypoglycemic drugs: Secondary | ICD-10-CM | POA: Insufficient documentation

## 2021-12-27 NOTE — Progress Notes (Signed)
[name]   Telephone:(336) [phone] Fax:(336) [phone]   Clinic Follow up Note   Patient Care Team: Michelle Leitz, MD as PCP - General (Family Medicine) Michelle Lever, MD as Consulting Physician (Pulmonary Disease) Michelle Craver, MD as Consulting Physician (Gastroenterology) Michelle Sherman, PT as Physical Therapist (Physical Therapy) Michelle Sherman, DPM as Consulting Physician (Podiatry) Michelle Apo, MD as Consulting Physician (Ophthalmology) Michelle Germany, RN as Oncology Nurse Navigator Michelle Kaufmann, RN as Oncology Nurse Navigator Michelle Klein, MD as Consulting Physician (General Surgery) Michelle Merle, MD as Consulting Physician (Hematology) Michelle Rudd, MD as Consulting Physician (Radiation Oncology) Michelle Feeling, NP as Nurse Practitioner (Nurse Practitioner) Michelle Sherman, [facility] (Inactive) as Pharmacist (Pharmacist) 12/27/2021  CHIEF COMPLAINT: Follow up right axillary incision   CURRENT THERAPY: S/p WLE and R SLNB for melanoma   INTERVAL HISTORY: Michelle Sherman returns for close f/up healing R axillary incision. She feels the area is "flatter" since I saw her and checked the site, thinks maybe the exam made the fluid dissipate.  Now less obvious when she moves her arm. Denies increased erythema, warmth, drainage, fever, chills, or any other new specific concerns.   MEDICAL HISTORY:  Past Medical History:  Diagnosis Date   Allergy    Arthritis    Breast cancer ([place]) 08/2019   right breast IDC   Cataract    Complication of anesthesia    unable to void after surgery and had to stay overnight    Diabetes mellitus without complication ([place])    Family history of adverse reaction to anesthesia    sister, hard to wake up    Family history of lung cancer    Family history of pancreatic cancer    GERD (gastroesophageal reflux disease)    Hypertension    Personal history of radiation therapy    Sleep apnea    wears CPAP nightly   Thyroid disease     Ulcerative colitis ([place])     SURGICAL HISTORY: Past Surgical History:  Procedure Laterality Date   BREAST LUMPECTOMY Right 10/2019   BREAST LUMPECTOMY WITH RADIOACTIVE SEED AND SENTINEL LYMPH NODE BIOPSY Right 10/14/2019   Procedure: RIGHT BREAST LUMPECTOMY WITH RADIOACTIVE SEED AND SENTINEL LYMPH NODE BIOPSY;  Surgeon: Michelle Klein, MD;  Location: [place];  Service: General;  Laterality: Right;   CHOLECYSTECTOMY     RE-EXCISION OF BREAST LUMPECTOMY Right 11/11/2019   Procedure: RIGHT RE-EXCISION OF BREAST LUMPECTOMY;  Surgeon: Michelle Klein, MD;  Location: [place];  Service: General;  Laterality: Right;  RNFA   SKIN SURGERY  12/2015   Fatty tumor removed   [place]    I have reviewed the social history and family history with the patient and they are unchanged from previous note.  ALLERGIES:  is allergic to nickel, sulfa antibiotics, lipitor [atorvastatin], and bydureon [exenatide].  MEDICATIONS:  Current Outpatient Medications  Medication Sig Dispense Refill   anastrozole (ARIMIDEX) 1 MG tablet TAKE ONE TABLET BY MOUTH ONE TIME DAILY 90 tablet 0   aspirin EC 81 MG tablet Take 1 tablet by mouth daily.     Biotin 2500 MCG CAPS Take 1 capsule by mouth daily.     Cholecalciferol (VITAMIN D3) 2000 units capsule Take 1 capsule by mouth daily.     clobetasol cream (TEMOVATE) 7.40 % Apply 1 Application topically 2 (two) times daily. Right arm x 2 bid prn 45 g 0   empagliflozin (JARDIANCE) 25 MG TABS  tablet Take 1 tablet (25 mg total) by mouth daily before breakfast. (Patient taking differently: Take 25 mg by mouth daily before breakfast. Has not been delivered. 08/18/21 HEI) 90 tablet 3   Evolocumab (REPATHA SURECLICK) 759 MG/ML SOAJ Inject 140 mg into the skin every 14 (fourteen) days. 6 mL 3   ezetimibe (ZETIA) 10 MG tablet Take 1 tablet (10 mg total) by mouth daily. 90 tablet 3   famotidine (PEPCID) 40 MG tablet Take 40 mg  by mouth 2 (two) times daily.     fexofenadine (ALLEGRA) 180 MG tablet Take 180 mg by mouth daily.     fluticasone (FLONASE) 50 MCG/ACT nasal spray Place into the nose.     levothyroxine (SYNTHROID) 112 MCG tablet Take 1 tablet (112 mcg total) by mouth daily. 90 tablet 3   mesalamine (LIALDA) 1.2 g EC tablet Take by mouth. Take 2 tablet in am and 2 tablet in pm     metFORMIN (GLUCOPHAGE) 1000 MG tablet Take 1 tablet (1,000 mg total) by mouth 2 (two) times daily with a meal. 180 tablet 3   Misc Natural Products (TURMERIC CURCUMIN) CAPS Take 1 capsule by mouth daily.     Multiple Vitamin (MULTIVITAMIN) tablet Take 1 tablet by mouth daily.     ramipril (ALTACE) 10 MG capsule Take 1 capsule (10 mg total) by mouth daily. 90 capsule 3   traZODone (DESYREL) 50 MG tablet Take 0.5-1 tablets (25-50 mg total) by mouth at bedtime as needed for sleep. 90 tablet 3   No current facility-administered medications for this visit.    PHYSICAL EXAMINATION:  Vitals:   12/27/21 1005  BP: 137/72  Pulse: 82  Resp: 18  Temp: 98.1 F (36.7 C)  SpO2: 98%   Filed Weights   12/27/21 1005  Weight: 188 lb 9.6 oz (85.5 kg)    GENERAL:alert, no distress and comfortable SKIN/LYMPH: S/p right axillary SLNB via previous lumpectomy incision, closed and healing well.  Small area of mild erythema and localized edema superior to the incision likely postop healing for seroma, stable from prior exam.  No signs of cellulitis, no palpable axillary adenopathy LUNGS: normal breathing effort   LABORATORY DATA:  I have reviewed the data as listed    Latest Ref Rng & Units 12/20/2021   11:39 AM 12/13/2021    1:59 PM 06/07/2021   10:11 AM  CBC  WBC 4.0 - 10.5 K/uL 6.8  6.4  5.5   Hemoglobin 12.0 - 15.0 g/dL 14.8  14.6  14.8   Hematocrit 36.0 - 46.0 % 45.3  44.5  45.4   Platelets 150 - 400 K/uL 288  363.0  304         Latest Ref Rng & Units 12/20/2021   11:39 AM 12/13/2021    1:59 PM 06/07/2021   10:11 AM  CMP   Glucose 70 - 99 mg/dL 158  122  191   BUN 8 - 23 mg/dL _0 Creatinine 0.44 - 1.00 mg/dL 0.66  0.58  0.70   Sodium 135 - 145 mmol/L 140  138  139   Potassium 3.5 - 5.1 mmol/L 4.4  4.4  3.6   Chloride 98 - 111 mmol/L 105  100  103   CO2 22 - 32 mmol/L _1 Calcium 8.9 - 10.3 mg/dL 10.8  10.3  9.7   Total Protein 6.5 - 8.1 g/dL 7.6  7.1  7.3   Total Bilirubin 0.3 -  1.2 mg/dL 0.4  0.3  0.4   Alkaline Phos 38 - 126 U/L 87  87  87   AST 15 - 41 U/L _0 ALT 0 - 44 U/L _1 RADIOGRAPHIC STUDIES: I have personally reviewed the radiological images as listed and agreed with the findings in the report. No results found.   ASSESSMENT & PLAN: 71 yo female   1.Skin/wound check 2. Right forearm melanoma, pT2b, s/p WLE and SLNB 11/18/21 [facility] 3. Malignant neoplasm of upper-outer quadrant of right breast, invasive ductal carcinoma, Stage 1A, pT1bN0M0, ER+/PR+/HER2-, Grade 2, RS 0 4. Genetics   Disposition: Ms. Plair appears well. R ax incision continues to heal, no signs of cellulitis. I feel the localized erythema and edema is likely scar tissue vs post op seroma. I do not think she needs antibiotics. She will continue to monitor the site, and let us or her surgeon know if things change. She knows to call for fever, chills, new/worse erythema, swelling, or warmth.   Next breast cancer surveillance visit in 05/2022, or sooner if needed.   Her previous genetic testing was negative, will check with counselor to see if additional testing is indicated given her newly diagnosed melanoma.     All questions were answered. The patient knows to call the clinic with any problems, questions or concerns. No barriers to learning were detected.     Michelle Feeling, NP 12/27/21

## 2021-12-28 ENCOUNTER — Encounter: Payer: Self-pay | Admitting: Genetic Counselor

## 2021-12-31 ENCOUNTER — Encounter: Payer: Self-pay | Admitting: Family Medicine

## 2021-12-31 DIAGNOSIS — Z23 Encounter for immunization: Secondary | ICD-10-CM | POA: Insufficient documentation

## 2021-12-31 DIAGNOSIS — E669 Obesity, unspecified: Secondary | ICD-10-CM | POA: Insufficient documentation

## 2021-12-31 DIAGNOSIS — R21 Rash and other nonspecific skin eruption: Secondary | ICD-10-CM | POA: Insufficient documentation

## 2021-12-31 HISTORY — DX: Encounter for immunization: Z23

## 2021-12-31 NOTE — Assessment & Plan Note (Signed)
Well-controlled. Continue ramipril 10 mg daily Follow-up 6 months

## 2021-12-31 NOTE — Assessment & Plan Note (Signed)
Normal TSH at last annual. Repeat TSH  Continue levothyroxine 112 mcg daily

## 2021-12-31 NOTE — Assessment & Plan Note (Signed)
A1c increased from previous visit. Repeat A1c Continue metformin 1000 mg twice daily Continue Jardiance 25 mg daily Consider GLP-1 at next visit.

## 2021-12-31 NOTE — Assessment & Plan Note (Addendum)
Well-controlled.  Reports taking mesalamine 1.2 g 2 tablets twice daily.  No documentation found in care everywhere or epic of last prescription filled. Will readdress with patient at next visit.

## 2021-12-31 NOTE — Assessment & Plan Note (Signed)
BMI 36.83.  History of type 2 diabetes, hypertension, hyperlipidemia. Encouraged healthy lifestyle and increased activity.

## 2021-12-31 NOTE — Assessment & Plan Note (Signed)
Low suspicion for cellulitis, DVT given no edema, increased erythema, calor. Follow-up with dermatology given history of malignancy. Can use Aveeno eczema until seen by dermatology.

## 2021-12-31 NOTE — Assessment & Plan Note (Signed)
Follows with Dr. Tamala Julian at lipid clinic. Currently on Repatha 140 mg injectable biweekly Continue Zetia 10 mg daily Recheck lipids

## 2022-01-19 ENCOUNTER — Other Ambulatory Visit: Payer: Medicare Other

## 2022-01-19 ENCOUNTER — Ambulatory Visit: Payer: Medicare Other | Admitting: Nurse Practitioner

## 2022-01-20 NOTE — Telephone Encounter (Signed)
This encounter was created in error - please disregard.

## 2022-02-06 ENCOUNTER — Ambulatory Visit: Payer: Medicare Other | Admitting: Cardiology

## 2022-02-15 ENCOUNTER — Ambulatory Visit: Payer: Medicare Other | Attending: Cardiology | Admitting: Cardiology

## 2022-02-15 ENCOUNTER — Encounter: Payer: Self-pay | Admitting: Cardiology

## 2022-02-15 VITALS — BP 144/64 | HR 76 | Ht 60.0 in | Wt 188.0 lb

## 2022-02-15 DIAGNOSIS — Z01812 Encounter for preprocedural laboratory examination: Secondary | ICD-10-CM | POA: Diagnosis not present

## 2022-02-15 DIAGNOSIS — Z79899 Other long term (current) drug therapy: Secondary | ICD-10-CM | POA: Diagnosis not present

## 2022-02-15 DIAGNOSIS — R079 Chest pain, unspecified: Secondary | ICD-10-CM | POA: Diagnosis not present

## 2022-02-15 DIAGNOSIS — I2510 Atherosclerotic heart disease of native coronary artery without angina pectoris: Secondary | ICD-10-CM

## 2022-02-15 DIAGNOSIS — E1159 Type 2 diabetes mellitus with other circulatory complications: Secondary | ICD-10-CM

## 2022-02-15 DIAGNOSIS — E1169 Type 2 diabetes mellitus with other specified complication: Secondary | ICD-10-CM

## 2022-02-15 DIAGNOSIS — E782 Mixed hyperlipidemia: Secondary | ICD-10-CM

## 2022-02-15 DIAGNOSIS — I152 Hypertension secondary to endocrine disorders: Secondary | ICD-10-CM

## 2022-02-15 DIAGNOSIS — G4733 Obstructive sleep apnea (adult) (pediatric): Secondary | ICD-10-CM | POA: Diagnosis not present

## 2022-02-15 DIAGNOSIS — E669 Obesity, unspecified: Secondary | ICD-10-CM

## 2022-02-15 MED ORDER — METOPROLOL TARTRATE 100 MG PO TABS: ORAL_TABLET | ORAL | 0 refills | Status: DC

## 2022-02-15 NOTE — Patient Instructions (Addendum)
Medication Instructions:  None *If you need a refill on your cardiac medications before your next appointment, please call your pharmacy*   Lab Work: Your physician recommends that you have labs drawn today: BMET, MAG, Lipids If you have labs (blood work) drawn today and your tests are completely normal, you will receive your results only by: [place] (if you have MyChart) OR A paper copy in the mail If you have any lab test that is abnormal or we need to change your treatment, we will call you to review the results.   Testing/Procedures:   Your cardiac CT will be scheduled at one of the below locations:   [facility] [address] [place], [place] [address] [phone]   If scheduled at [facility], please arrive at the [facility] Entrance (Entrance C2) of [facility] 30 minutes prior to test start time. You can use the FREE valet parking offered at entrance C (encouraged to control the heart rate for the test)  Proceed to the [facility] Radiology Department (first floor) to check-in and test prep.  All radiology patients and guests should use entrance C2 at [facility], accessed from [facility], even though the hospital's physical address listed is [address]      Please follow these instructions carefully (unless otherwise directed):   On the Night Before the Test: Be sure to Drink plenty of water. Do not consume any caffeinated/decaffeinated beverages or chocolate 12 hours prior to your test. Do not take any antihistamines 12 hours prior to your test.  On the Day of the Test: Drink plenty of water until 1 hour prior to the test. Do not eat any food 1 hour prior to test. You may take your regular medications prior to the test.  Take metoprolol (Lopressor) two hours prior to test. FEMALES- please wear underwire-free bra if available, avoid dresses & tight clothing      After the  Test: Drink plenty of water. After receiving IV contrast, you may experience a mild flushed feeling. This is normal. On occasion, you may experience a mild rash up to 24 hours after the test. This is not dangerous. If this occurs, you can take Benadryl 25 mg and increase your fluid intake. If you experience trouble breathing, this can be serious. If it is severe call 911 IMMEDIATELY. If it is mild, please call our office. If you take any of these medications: Metformin please do not take 48 hours after completing test unless otherwise instructed.  We will call to schedule your test 2-4 weeks out understanding that some insurance companies will need an authorization prior to the service being performed.   For non-scheduling related questions, please contact the cardiac imaging nurse navigator should you have any questions/concerns: Marchia Bond, Cardiac Imaging Nurse Navigator Gordy Clement, Cardiac Imaging Nurse Navigator [facility] Direct Office Dial: [phone]   For scheduling needs, including cancellations and rescheduling, please call [place], [phone].    Follow-Up: At [facility], you and your health needs are our priority.  As part of our continuing mission to provide you with exceptional heart care, we have created designated Provider Care Teams.  These Care Teams include your primary Cardiologist (physician) and Advanced Practice Providers (APPs -  Physician Assistants and Nurse Practitioners) who all work together to provide you with the care you need, when you need it.  We recommend signing up for the patient portal called "  MyChart".  Sign up information is provided on this After Visit Summary.  MyChart is used to connect with patients for Virtual Visits (Telemedicine).  Patients are able to view lab/test results, encounter notes, upcoming appointments, etc.  Non-urgent messages can be sent to your provider as well.   To learn more  about what you can do with MyChart, go to [email or web address].    Your next appointment:   6 month(s)  Provider:   Berniece Salines, DO   Other Instructions Mediterranean Diet A Mediterranean diet refers to food and lifestyle choices that are based on the traditions of countries located on the [place]. It focuses on eating more fruits, vegetables, whole grains, beans, nuts, seeds, and heart-healthy fats, and eating less dairy, meat, eggs, and processed foods with added sugar, salt, and fat. This way of eating has been shown to help prevent certain conditions and improve outcomes for people who have chronic diseases, like kidney disease and heart disease. What are tips for following this plan? Reading food labels Check the serving size of packaged foods. For foods such as rice and pasta, the serving size refers to the amount of cooked product, not dry. Check the total fat in packaged foods. Avoid foods that have saturated fat or trans fats. Check the ingredient list for added sugars, such as corn syrup. Shopping  Buy a variety of foods that offer a balanced diet, including: Fresh fruits and vegetables (produce). Grains, beans, nuts, and seeds. Some of these may be available in unpackaged forms or large amounts (in bulk). Fresh seafood. Poultry and eggs. Low-fat dairy products. Buy whole ingredients instead of prepackaged foods. Buy fresh fruits and vegetables in-season from local farmers markets. Buy plain frozen fruits and vegetables. If you do not have access to quality fresh seafood, buy precooked frozen shrimp or canned fish, such as tuna, salmon, or sardines. Stock your pantry so you always have certain foods on hand, such as olive oil, canned tuna, canned tomatoes, rice, pasta, and beans. Cooking Cook foods with extra-virgin olive oil instead of using butter or other vegetable oils. Have meat as a side dish, and have vegetables or grains as your main dish. This means  having meat in small portions or adding small amounts of meat to foods like pasta or stew. Use beans or vegetables instead of meat in common dishes like chili or lasagna. Experiment with different cooking methods. Try roasting, broiling, steaming, and sauting vegetables. Add frozen vegetables to soups, stews, pasta, or rice. Add nuts or seeds for added healthy fats and plant protein at each meal. You can add these to yogurt, salads, or vegetable dishes. Marinate fish or vegetables using olive oil, lemon juice, garlic, and fresh herbs. Meal planning Plan to eat one vegetarian meal one day each week. Try to work up to two vegetarian meals, if possible. Eat seafood two or more times a week. Have healthy snacks readily available, such as: Vegetable sticks with hummus. Greek yogurt. Fruit and nut trail mix. Eat balanced meals throughout the week. This includes: Fruit: 2-3 servings a day. Vegetables: 4-5 servings a day. Low-fat dairy: 2 servings a day. Fish, poultry, or lean meat: 1 serving a day. Beans and legumes: 2 or more servings a week. Nuts and seeds: 1-2 servings a day. Whole grains: 6-8 servings a day. Extra-virgin olive oil: 3-4 servings a day. Limit red meat and sweets to only a few servings a month. Lifestyle  Cook and eat meals together with your family, when possible. Drink  enough fluid to keep your urine pale yellow. Be physically active every day. This includes: Aerobic exercise like running or swimming. Leisure activities like gardening, walking, or housework. Get 7-8 hours of sleep each night. If recommended by your health care provider, drink red wine in moderation. This means 1 glass a day for nonpregnant women and 2 glasses a day for men. A glass of wine equals 5 oz (150 mL). What foods should I eat? Fruits Apples. Apricots. Avocado. Berries. Bananas. Cherries. Dates. Figs. Grapes. Lemons. Melon. Oranges. Peaches. Plums. Pomegranate. Vegetables Artichokes. Beets.  Broccoli. Cabbage. Carrots. Eggplant. Green beans. Chard. Kale. Spinach. Onions. Leeks. Peas. Squash. Tomatoes. Peppers. Radishes. Grains Whole-grain pasta. Brown rice. Bulgur wheat. Polenta. Couscous. Whole-wheat bread. [place]. Meats and other proteins Beans. Almonds. Sunflower seeds. Pine nuts. Peanuts. [place]. Salmon. Scallops. Shrimp. [place]. Tilapia. Clams. Oysters. Eggs. Poultry without skin. Dairy Low-fat milk. Cheese. Greek yogurt. Fats and oils Extra-virgin olive oil. Avocado oil. Grapeseed oil. Beverages Water. Red wine. Herbal tea. Sweets and desserts Greek yogurt with honey. Baked apples. Poached pears. Trail mix. Seasonings and condiments Basil. Cilantro. Coriander. Cumin. Mint. Parsley. Sage. Rosemary. Tarragon. Garlic. Oregano. Thyme. Pepper. Balsamic vinegar. Tahini. Hummus. Tomato sauce. Olives. Mushrooms. The items listed above may not be a complete list of foods and beverages you can eat. Contact a dietitian for more information. What foods should I limit? This is a list of foods that should be eaten rarely or only on special occasions. Fruits Fruit canned in syrup. Vegetables Deep-fried potatoes (french fries). Grains Prepackaged pasta or rice dishes. Prepackaged cereal with added sugar. Prepackaged snacks with added sugar. Meats and other proteins Beef. Pork. Lamb. Poultry with skin. Hot dogs. Berniece Salines. Dairy Ice cream. Sour cream. Whole milk. Fats and oils Butter. Canola oil. Vegetable oil. Beef fat (tallow). Lard. Beverages Juice. Sugar-sweetened soft drinks. Beer. Liquor and spirits. Sweets and desserts Cookies. Cakes. Pies. Candy. Seasonings and condiments Mayonnaise. Pre-made sauces and marinades. The items listed above may not be a complete list of foods and beverages you should limit. Contact a dietitian for more information. Summary The Mediterranean diet includes both food and lifestyle choices. Eat a variety of fresh fruits and vegetables, beans,  nuts, seeds, and whole grains. Limit the amount of red meat and sweets that you eat. If recommended by your health care provider, drink red wine in moderation. This means 1 glass a day for nonpregnant women and 2 glasses a day for men. A glass of wine equals 5 oz (150 mL). This information is not intended to replace advice given to you by your health care provider. Make sure you discuss any questions you have with your health care provider. Document Revised: 02/14/2019 Document Reviewed: 12/12/2018 Elsevier Patient Education  [place] Eating a healthy diet is important for the health of your heart. A heart-healthy eating plan includes: Eating less unhealthy fats. Eating more healthy fats. Eating less salt in your food. Salt is also called sodium. Making other changes in your diet. Talk with your doctor or a diet specialist (dietitian) to create an eating plan that is right for you. What are tips for following this plan? Cooking Avoid frying your food. Try to bake, boil, grill, or broil it instead. You can also reduce fat by: Removing the skin from poultry. Removing all visible fats from meats. Steaming vegetables in water or broth. Meal planning  At meals, divide your plate into four equal parts: Fill one-half of your plate with vegetables  and green salads. Fill one-fourth of your plate with whole grains. Fill one-fourth of your plate with lean protein foods. Eat 2-4 cups of vegetables per day. One cup of vegetables is: 1 cup (91 g) broccoli or cauliflower florets. 2 medium carrots. 1 large bell pepper. 1 large sweet potato. 1 large tomato. 1 medium white potato. 2 cups (150 g) raw leafy greens. Eat 1-2 cups of fruit per day. One cup of fruit is: 1 small apple 1 large banana 1 cup (237 g) mixed fruit, 1 large orange,  cup (82 g) dried fruit, 1 cup (240 mL) 100% fruit juice. Eat more foods that have soluble fiber. These are apples,  broccoli, carrots, beans, peas, and barley. Try to get 20-30 g of fiber per day. Eat 4-5 servings of nuts, legumes, and seeds per week: 1 serving of dried beans or legumes equals  cup (90 g) cooked. 1 serving of nuts is  oz (12 almonds, 24 pistachios, or 7 walnut halves). 1 serving of seeds equals  oz (8 g). General information Eat more home-cooked food. Eat less restaurant, buffet, and fast food. Limit or avoid alcohol. Limit foods that are high in starch and sugar. Avoid fried foods. Lose weight if you are overweight. Keep track of how much salt (sodium) you eat. This is important if you have high blood pressure. Ask your doctor to tell you more about this. Try to add vegetarian meals each week. Fats Choose healthy fats. These include olive oil and canola oil, flaxseeds, walnuts, almonds, and seeds. Eat more omega-3 fats. These include salmon, mackerel, sardines, tuna, flaxseed oil, and ground flaxseeds. Try to eat fish at least 2 times each week. Check food labels. Avoid foods with trans fats or high amounts of saturated fat. Limit saturated fats. These are often found in animal products, such as meats, butter, and cream. These are also found in plant foods, such as palm oil, palm kernel oil, and coconut oil. Avoid foods with partially hydrogenated oils in them. These have trans fats. Examples are stick margarine, some tub margarines, cookies, crackers, and other baked goods. What foods should I eat? Fruits All fresh, canned (in natural juice), or frozen fruits. Vegetables Fresh or frozen vegetables (raw, steamed, roasted, or grilled). Green salads. Grains Most grains. Choose whole wheat and whole grains most of the time. Rice and pasta, including brown rice and pastas made with whole wheat. Meats and other proteins Lean, well-trimmed beef, veal, pork, and lamb. Chicken and [place] without skin. All fish and shellfish. Wild duck, rabbit, pheasant, and venison. Egg whites or  low-cholesterol egg substitutes. Dried beans, peas, lentils, and tofu. Seeds and most nuts. Dairy Low-fat or nonfat cheeses, including ricotta and mozzarella. Skim or 1% milk that is liquid, powdered, or evaporated. Buttermilk that is made with low-fat milk. Nonfat or low-fat yogurt. Fats and oils Non-hydrogenated (trans-free) margarines. Vegetable oils, including soybean, sesame, sunflower, olive, peanut, safflower, corn, canola, and cottonseed. Salad dressings or mayonnaise made with a vegetable oil. Beverages Mineral water. Coffee and tea. Diet carbonated beverages. Sweets and desserts Sherbet, gelatin, and fruit ice. Small amounts of dark chocolate. Limit all sweets and desserts. Seasonings and condiments All seasonings and condiments. The items listed above may not be a complete list of foods and drinks you can eat. Contact a dietitian for more options. What foods should I avoid? Fruits Canned fruit in heavy syrup. Fruit in cream or butter sauce. Fried fruit. Limit coconut. Vegetables Vegetables cooked in cheese, cream, or butter sauce.  Fried vegetables. Grains Breads that are made with saturated or trans fats, oils, or whole milk. Croissants. Sweet rolls. Donuts. High-fat crackers, such as cheese crackers. Meats and other proteins Fatty meats, such as hot dogs, ribs, sausage, bacon, rib-eye roast or steak. High-fat deli meats, such as salami and bologna. Caviar. Domestic duck and goose. Organ meats, such as liver. Dairy Cream, sour cream, cream cheese, and creamed cottage cheese. Whole-milk cheeses. Whole or 2% milk that is liquid, evaporated, or condensed. Whole buttermilk. Cream sauce or high-fat cheese sauce. Yogurt that is made from whole milk. Fats and oils Meat fat, or shortening. Cocoa butter, hydrogenated oils, palm oil, coconut oil, palm kernel oil. Solid fats and shortenings, including bacon fat, salt pork, lard, and butter. Nondairy cream substitutes. Salad dressings with  cheese or sour cream. Beverages Regular sodas and juice drinks with added sugar. Sweets and desserts Frosting. Pudding. Cookies. Cakes. Pies. Milk chocolate or white chocolate. Buttered syrups. Full-fat ice cream or ice cream drinks. The items listed above may not be a complete list of foods and drinks to avoid. Contact a dietitian for more information. Summary Heart-healthy meal planning includes eating less unhealthy fats, eating more healthy fats, and making other changes in your diet. Eat a balanced diet. This includes fruits and vegetables, low-fat or nonfat dairy, lean protein, nuts and legumes, whole grains, and heart-healthy oils and fats. This information is not intended to replace advice given to you by your health care provider. Make sure you discuss any questions you have with your health care provider. Document Revised: 02/14/2021 Document Reviewed: 02/14/2021 Elsevier Patient Education  [place].

## 2022-02-15 NOTE — Progress Notes (Signed)
Cardiology Office Note:    Date:  02/18/2022   ID:  Michelle Sherman, DOB 09/02/1950, MRN [id number]  PCP:  Michelle Leitz, MD  Cardiologist:  None  Electrophysiologist:  None   Referring MD: Michelle Leitz, MD   " I am experiencing chest pain"   History of Present Illness:    Michelle Sherman is a 72 y.o. female with a hx of breast cancer status post lumpectomy, diabetes mellitus, sleep apnea on cpap, thyroid disease and coronary calcification on ct. Here today to be evaluated for chest pain.  She describes her chest pain as a left sided pressures like sensation. Off and on. Occurs on exertion. Nothing makes it better or worse.    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 lfts in 100s, resolved with cessation of lipitor. Normal liver ultrasound   Elevated CK 08/15/2021   Family history of adverse reaction to anesthesia    sister, hard to wake up    Family history of lung cancer    Family history of pancreatic cancer    Family history of pancreatic cancer    Genetic testing 10/06/2019   Negative genetic testing:  No pathogenic variants detected on the [facility] Breast Cancer STAT Panel + Common Hereditary Cancers Panel. 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 The report date is 10/05/2019.     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   Palpitations 01/27/2021   Personal history of radiation therapy    Severe obesity (BMI 35.0-35.9 with comorbidity) ([place]) 11/01/2016   Sleep apnea    wears CPAP nightly   Thyroid disease    Ulcerative colitis [facility])     Past Surgical History:  Procedure Laterality Date   BREAST LUMPECTOMY Right 10/2019   BREAST LUMPECTOMY WITH RADIOACTIVE SEED AND SENTINEL LYMPH NODE BIOPSY Right 10/14/2019   Procedure: RIGHT BREAST LUMPECTOMY WITH RADIOACTIVE SEED AND SENTINEL LYMPH NODE BIOPSY;  Surgeon: [name], MD;  Location: [facility];  Service: General;  Laterality: Right;   CHOLECYSTECTOMY     RE-EXCISION OF BREAST LUMPECTOMY Right 11/11/2019   Procedure: RIGHT RE-EXCISION OF BREAST LUMPECTOMY;  Surgeon: [name], MD;  Location: [facility];  Service: General;  Laterality: Right;  RNFA   SKIN SURGERY  12/2015   Fatty tumor removed   TONSILLECTOMY AND ADENOIDECTOMY  1956    Current Medications: Current Meds  Medication Sig   anastrozole (ARIMIDEX) 1 MG tablet TAKE ONE TABLET BY MOUTH ONE TIME DAILY  aspirin EC 81 MG tablet Take 1 tablet by mouth daily.   Biotin 2500 MCG CAPS Take 1 capsule by mouth daily.   Cholecalciferol (VITAMIN D3) 2000 units capsule Take 1 capsule by mouth daily.   empagliflozin (JARDIANCE) 25 MG TABS tablet Take 1 tablet (25 mg total) by mouth daily before breakfast. (Patient taking differently: Take 25 mg by mouth daily before breakfast. Has not been delivered. 08/18/21 HEI)   Evolocumab (REPATHA SURECLICK) 176 MG/ML SOAJ Inject 140 mg into the skin every 14 (fourteen) days.   ezetimibe (ZETIA) 10 MG tablet Take 1  tablet (10 mg total) by mouth daily.   famotidine (PEPCID) 40 MG tablet Take 40 mg by mouth 2 (two) times daily.   fexofenadine (ALLEGRA) 180 MG tablet Take 180 mg by mouth daily.   fluticasone (FLONASE) 50 MCG/ACT nasal spray Place into the nose.   levothyroxine (SYNTHROID) 112 MCG tablet Take 1 tablet (112 mcg total) by mouth daily.   mesalamine (LIALDA) 1.2 g EC tablet Take by mouth. Take 2 tablet in am and 2 tablet in pm   metFORMIN (GLUCOPHAGE) 1000 MG tablet Take 1 tablet (1,000 mg total) by mouth 2 (two) times daily with a meal.   metoprolol tartrate (LOPRESSOR) 100 MG tablet Take 2 hours prior to CT   Misc Natural Products (TURMERIC CURCUMIN) CAPS Take 1 capsule by mouth daily.   Multiple Vitamin (MULTIVITAMIN) tablet Take 1 tablet by mouth daily.   ramipril (ALTACE) 10 MG capsule Take 1 capsule (10 mg total) by mouth daily.   traZODone (DESYREL) 50 MG tablet Take 0.5-1 tablets (25-50 mg total) by mouth at bedtime as needed for sleep.     Allergies:   Nickel, Sulfa antibiotics, Lipitor [atorvastatin], and Bydureon [exenatide]   Social History   Socioeconomic History   Marital status: Married    Spouse name: Not on file   Number of children: 1   Years of education: Not on file   Highest education level: Not on file  Occupational History   Occupation: Retired     Comment: Oceanographer   Tobacco Use   Smoking status: Never   Smokeless tobacco: Never  Scientific laboratory technician Use: Never used  Substance and Sexual Activity   Alcohol use: Yes    Comment: occa   Drug use: No   Sexual activity: Yes    Birth control/protection: Post-menopausal  Other Topics Concern   Not on file  Social History Narrative   Married, cares for disabled husband,  no tob, Etoh or drug use; no exercise   Social Determinants of Health   Financial Resource Strain: Low Risk  (10/13/2021)   Overall Financial Resource Strain (CARDIA)    Difficulty of Paying Living Expenses: Not hard at all  Food  Insecurity: No Food Insecurity (10/13/2021)   Hunger Vital Sign    Worried About Running Out of Food in the Last Year: Never true    [place] in the Last Year: Never true  Transportation Needs: No Transportation Needs (10/13/2021)   PRAPARE - Hydrologist (Medical): No    Lack of Transportation (Non-Medical): No  Physical Activity: Inactive (04/05/2020)   Exercise Vital Sign    Days of Exercise per Week: 0 days    Minutes of Exercise per Session: 0 min  Stress: No Stress Concern Present (07/11/2021)   [place]    Feeling of Stress : Not at all  Social Connections: Socially Integrated (07/11/2021)   Social Connection and Isolation Panel [NHANES]    Frequency of Communication with Friends and Family: More than three times a week    Frequency of Social Gatherings with Friends and Family: Once a week    Attends Religious Services: More than 4 times per year    Active Member of [employer] or Organizations: Yes    Attends Music therapist: 1 to 4 times per year    Marital Status: Married     Family History: The patient's family history includes Cancer in her brother and paternal aunt; Crohn's disease in her brother; Diabetes in her brother, brother, maternal aunt, maternal uncle, and mother; Heart attack in her paternal grandfather; Heart disease in her father; Lung cancer in her paternal aunt; Lung cancer (age of onset: 73) in her father; Other in her sister; Pancreatic cancer (age of onset: 48) in her brother; Ulcerative colitis in her brother and mother; Varicose Veins in her sister.  ROS:   Review of Systems  Constitution: Negative for decreased appetite, fever and weight gain.  HENT: Negative for congestion, ear discharge, hoarse voice and sore throat.   Eyes: Negative for discharge, redness, vision loss in right eye and visual halos.  Cardiovascular: Negative for chest pain,  dyspnea on exertion, leg swelling, orthopnea and palpitations.  Respiratory: Negative for cough, hemoptysis, shortness of breath and snoring.   Endocrine: Negative for heat intolerance and polyphagia.  Hematologic/Lymphatic: Negative for bleeding problem. Does not bruise/bleed easily.  Skin: Negative for flushing, nail changes, rash and suspicious lesions.  Musculoskeletal: Negative for arthritis, joint pain, muscle cramps, myalgias, neck pain and stiffness.  Gastrointestinal: Negative for abdominal pain, bowel incontinence, diarrhea and excessive appetite.  Genitourinary: Negative for decreased libido, genital sores and incomplete emptying.  Neurological: Negative for brief paralysis, focal weakness, headaches and loss of balance.  Psychiatric/Behavioral: Negative for altered mental status, depression and suicidal ideas.  Allergic/Immunologic: Negative for HIV exposure and persistent infections.    EKGs/Labs/Other Studies Reviewed:    The following studies were reviewed today:   EKG:  The ekg ordered today demonstrates   Coronary calcium score ct IMPRESSION: Coronary calcium score of 145. This was 42 rd percentile for age and sex matched control.   [place]    Recent Labs: 12/13/2021: TSH 1.29 12/20/2021: ALT 13; Hemoglobin 14.8; Platelet Count 288 02/15/2022: BUN 15; Creatinine, Ser 0.61; Magnesium 2.1; Potassium 4.4; Sodium 141  Recent Lipid Panel    Component Value Date/Time   CHOL 119 02/15/2022 1038   TRIG 200 (H) 02/15/2022 1038   HDL 70 02/15/2022 1038   CHOLHDL 1.7 02/15/2022 1038   CHOLHDL 3 12/13/2021 1359   VLDL 47.8 (H) 12/13/2021 1359   LDLCALC 19 02/15/2022 1038   LDLCALC 62 01/07/2020 0907   LDLDIRECT 110.0 12/13/2021 1359    Physical Exam:    VS:  BP (!) 144/64   Pulse 76   Ht 5' (1.524 m)   Wt 85.3 kg   SpO2 97%   BMI 36.72 kg/m     Wt Readings from Last 3 Encounters:  02/15/22 85.3 kg  12/27/21 85.5 kg  12/20/21 85.4 kg     GEN: Well  nourished, well developed in no acute distress HEENT: Normal NECK: No JVD; No carotid bruits LYMPHATICS: No lymphadenopathy CARDIAC: S1S2 noted,RRR, no murmurs, rubs, gallops RESPIRATORY:  Clear to auscultation without rales, wheezing or rhonchi  ABDOMEN: Soft, non-tender, non-distended, +bowel sounds, no guarding. EXTREMITIES: No edema, No cyanosis,  no clubbing MUSCULOSKELETAL:  No deformity  SKIN: Warm and dry NEUROLOGIC:  Alert and oriented x 3, non-focal PSYCHIATRIC:  Normal affect, good insight  ASSESSMENT:    1. Chest pain, unspecified type   2. Pre-procedure lab exam   3. Medication management   4. Hypertension associated with diabetes ([place])   5. OSA on CPAP   6. Obesity, Class II, BMI 35-39.9, isolated (see actual BMI)   7. Coronary artery calcification seen on CT scan   8. Combined hyperlipidemia associated with type 2 diabetes mellitus (HCC)    PLAN:     The symptoms chest pain is concerning, this patient does have intermediate risk for coronary artery disease and at this time I would like to pursue an ischemic evaluation in this patient.  Shared decision a coronary CTA at this time is appropriate.  I have discussed with the patient about the testing.  The patient has no IV contrast allergy and is agreeable to proceed with this test.    Continue current lipid lowering regimen.  OSA - cont  cpap   Lifestyle modification advised.  The patient is in agreement with the above plan. The patient left the office in stable condition.  The patient will follow up in   Medication Adjustments/Labs and Tests Ordered: Current medicines are reviewed at length with the patient today.  Concerns regarding medicines are outlined above.  Orders Placed This Encounter  Procedures   CT CORONARY MORPH W/CTA COR W/SCORE W/CA W/CM &/OR WO/CM   Basic Metabolic Panel (BMET)   Magnesium   Lipid panel   Meds ordered this encounter  Medications   metoprolol tartrate (LOPRESSOR) 100 MG  tablet    Sig: Take 2 hours prior to CT    Dispense:  1 tablet    Refill:  0    Patient Instructions  Medication Instructions:  None *If you need a refill on your cardiac medications before your next appointment, please call your pharmacy*   Lab Work: Your physician recommends that you have labs drawn today: BMET, MAG, Lipids If you have labs (blood work) drawn today and your tests are completely normal, you will receive your results only by: MyChart Message (if you have MyChart) OR A paper copy in the mail If you have any lab test that is abnormal or we need to change your treatment, we will call you to review the results.   Testing/Procedures:   Your cardiac CT will be scheduled at one of the below locations:   [facility] [address] [place], [place] [address] [phone]   If scheduled at [facility], please arrive at the [facility] and Children's Entrance (Entrance C2) of [facility] 30 minutes prior to test start time. You can use the FREE valet parking offered at entrance C (encouraged to control the heart rate for the test)  Proceed to the [facility] Radiology Department (first floor) to check-in and test prep.  All radiology patients and guests should use entrance C2 at [facility], accessed from [facility], even though the hospital's physical address listed is [address]      Please follow these instructions carefully (unless otherwise directed):   On the Night Before the Test: Be sure to Drink plenty of water. Do not consume any caffeinated/decaffeinated beverages or chocolate 12 hours prior to your test. Do not take any antihistamines 12 hours prior to your test.  On the Day of the Test: Drink plenty of  water until 1 hour prior to the test. Do not eat any food 1 hour prior to test. You may take your regular medications prior to the test.  Take metoprolol (Lopressor) two hours prior to  test. FEMALES- please wear underwire-free bra if available, avoid dresses & tight clothing      After the Test: Drink plenty of water. After receiving IV contrast, you may experience a mild flushed feeling. This is normal. On occasion, you may experience a mild rash up to 24 hours after the test. This is not dangerous. If this occurs, you can take Benadryl 25 mg and increase your fluid intake. If you experience trouble breathing, this can be serious. If it is severe call 911 IMMEDIATELY. If it is mild, please call our office. If you take any of these medications: Metformin please do not take 48 hours after completing test unless otherwise instructed.  We will call to schedule your test 2-4 weeks out understanding that some insurance companies will need an authorization prior to the service being performed.   For non-scheduling related questions, please contact the cardiac imaging nurse navigator should you have any questions/concerns: Marchia Bond, Cardiac Imaging Nurse Navigator Gordy Clement, Cardiac Imaging Nurse Navigator [facility] Direct Office Dial: [phone]   For scheduling needs, including cancellations and rescheduling, please call [place], [phone].    Follow-Up: At [facility], you and your health needs are our priority.  As part of our continuing mission to provide you with exceptional heart care, we have created designated Provider Care Teams.  These Care Teams include your primary Cardiologist (physician) and Advanced Practice Providers (APPs -  Physician Assistants and Nurse Practitioners) who all work together to provide you with the care you need, when you need it.  We recommend signing up for the patient portal called "MyChart".  Sign up information is provided on this After Visit Summary.  MyChart is used to connect with patients for Virtual Visits (Telemedicine).  Patients are able to view lab/test results, encounter notes,  upcoming appointments, etc.  Non-urgent messages can be sent to your provider as well.   To learn more about what you can do with MyChart, go to [email or web address].    Your next appointment:   6 month(s)  Provider:   Berniece Salines, DO   Other Instructions Mediterranean Diet A Mediterranean diet refers to food and lifestyle choices that are based on the traditions of countries located on the [place]. It focuses on eating more fruits, vegetables, whole grains, beans, nuts, seeds, and heart-healthy fats, and eating less dairy, meat, eggs, and processed foods with added sugar, salt, and fat. This way of eating has been shown to help prevent certain conditions and improve outcomes for people who have chronic diseases, like kidney disease and heart disease. What are tips for following this plan? Reading food labels Check the serving size of packaged foods. For foods such as rice and pasta, the serving size refers to the amount of cooked product, not dry. Check the total fat in packaged foods. Avoid foods that have saturated fat or trans fats. Check the ingredient list for added sugars, such as corn syrup. Shopping  Buy a variety of foods that offer a balanced diet, including: Fresh fruits and vegetables (produce). Grains, beans, nuts, and seeds. Some of these may be available in unpackaged forms or large amounts (in bulk). Fresh seafood. Poultry and eggs. Low-fat dairy products. Buy whole ingredients instead of prepackaged foods. Buy fresh fruits  and vegetables in-season from local farmers markets. Buy plain frozen fruits and vegetables. If you do not have access to quality fresh seafood, buy precooked frozen shrimp or canned fish, such as tuna, salmon, or sardines. Stock your pantry so you always have certain foods on hand, such as olive oil, canned tuna, canned tomatoes, rice, pasta, and beans. Cooking Cook foods with extra-virgin olive oil instead of using butter or other  vegetable oils. Have meat as a side dish, and have vegetables or grains as your main dish. This means having meat in small portions or adding small amounts of meat to foods like pasta or stew. Use beans or vegetables instead of meat in common dishes like chili or lasagna. Experiment with different cooking methods. Try roasting, broiling, steaming, and sauting vegetables. Add frozen vegetables to soups, stews, pasta, or rice. Add nuts or seeds for added healthy fats and plant protein at each meal. You can add these to yogurt, salads, or vegetable dishes. Marinate fish or vegetables using olive oil, lemon juice, garlic, and fresh herbs. Meal planning Plan to eat one vegetarian meal one day each week. Try to work up to two vegetarian meals, if possible. Eat seafood two or more times a week. Have healthy snacks readily available, such as: Vegetable sticks with hummus. Greek yogurt. Fruit and nut trail mix. Eat balanced meals throughout the week. This includes: Fruit: 2-3 servings a day. Vegetables: 4-5 servings a day. Low-fat dairy: 2 servings a day. Fish, poultry, or lean meat: 1 serving a day. Beans and legumes: 2 or more servings a week. Nuts and seeds: 1-2 servings a day. Whole grains: 6-8 servings a day. Extra-virgin olive oil: 3-4 servings a day. Limit red meat and sweets to only a few servings a month. Lifestyle  Cook and eat meals together with your family, when possible. Drink enough fluid to keep your urine pale yellow. Be physically active every day. This includes: Aerobic exercise like running or swimming. Leisure activities like gardening, walking, or housework. Get 7-8 hours of sleep each night. If recommended by your health care provider, drink red wine in moderation. This means 1 glass a day for nonpregnant women and 2 glasses a day for men. A glass of wine equals 5 oz (150 mL). What foods should I eat? Fruits Apples. Apricots. Avocado. Berries. Bananas. Cherries.  Dates. Figs. Grapes. Lemons. Melon. Oranges. Peaches. Plums. Pomegranate. Vegetables Artichokes. Beets. Broccoli. Cabbage. Carrots. Eggplant. Green beans. Chard. Kale. Spinach. Onions. Leeks. Peas. Squash. Tomatoes. Peppers. Radishes. Grains Whole-grain pasta. Brown rice. Bulgur wheat. Polenta. Couscous. Whole-wheat bread. [place]. Meats and other proteins Beans. Almonds. Sunflower seeds. Pine nuts. Peanuts. [place]. Salmon. Scallops. Shrimp. [place]. Tilapia. Clams. Oysters. Eggs. Poultry without skin. Dairy Low-fat milk. Cheese. Greek yogurt. Fats and oils Extra-virgin olive oil. Avocado oil. Grapeseed oil. Beverages Water. Red wine. Herbal tea. Sweets and desserts Greek yogurt with honey. Baked apples. Poached pears. Trail mix. Seasonings and condiments Basil. Cilantro. Coriander. Cumin. Mint. Parsley. Sage. Rosemary. Tarragon. Garlic. Oregano. Thyme. Pepper. Balsamic vinegar. Tahini. Hummus. Tomato sauce. Olives. Mushrooms. The items listed above may not be a complete list of foods and beverages you can eat. Contact a dietitian for more information. What foods should I limit? This is a list of foods that should be eaten rarely or only on special occasions. Fruits Fruit canned in syrup. Vegetables Deep-fried potatoes (french fries). Grains Prepackaged pasta or rice dishes. Prepackaged cereal with added sugar. Prepackaged snacks with added sugar. Meats and other proteins Beef. Pork.  Marya Amsler with skin. Hot dogs. Berniece Salines. Dairy Ice cream. Sour cream. Whole milk. Fats and oils Butter. Canola oil. Vegetable oil. Beef fat (tallow). Lard. Beverages Juice. Sugar-sweetened soft drinks. Beer. Liquor and spirits. Sweets and desserts Cookies. Cakes. Pies. Candy. Seasonings and condiments Mayonnaise. Pre-made sauces and marinades. The items listed above may not be a complete list of foods and beverages you should limit. Contact a dietitian for more information. Summary The  Mediterranean diet includes both food and lifestyle choices. Eat a variety of fresh fruits and vegetables, beans, nuts, seeds, and whole grains. Limit the amount of red meat and sweets that you eat. If recommended by your health care provider, drink red wine in moderation. This means 1 glass a day for nonpregnant women and 2 glasses a day for men. A glass of wine equals 5 oz (150 mL). This information is not intended to replace advice given to you by your health care provider. Make sure you discuss any questions you have with your health care provider. Document Revised: 02/14/2019 Document Reviewed: 12/12/2018 Elsevier Patient Education  [place] Eating a healthy diet is important for the health of your heart. A heart-healthy eating plan includes: Eating less unhealthy fats. Eating more healthy fats. Eating less salt in your food. Salt is also called sodium. Making other changes in your diet. Talk with your doctor or a diet specialist (dietitian) to create an eating plan that is right for you. What are tips for following this plan? Cooking Avoid frying your food. Try to bake, boil, grill, or broil it instead. You can also reduce fat by: Removing the skin from poultry. Removing all visible fats from meats. Steaming vegetables in water or broth. Meal planning  At meals, divide your plate into four equal parts: Fill one-half of your plate with vegetables and green salads. Fill one-fourth of your plate with whole grains. Fill one-fourth of your plate with lean protein foods. Eat 2-4 cups of vegetables per day. One cup of vegetables is: 1 cup (91 g) broccoli or cauliflower florets. 2 medium carrots. 1 large bell pepper. 1 large sweet potato. 1 large tomato. 1 medium white potato. 2 cups (150 g) raw leafy greens. Eat 1-2 cups of fruit per day. One cup of fruit is: 1 small apple 1 large banana 1 cup (237 g) mixed fruit, 1 large orange,  cup  (82 g) dried fruit, 1 cup (240 mL) 100% fruit juice. Eat more foods that have soluble fiber. These are apples, broccoli, carrots, beans, peas, and barley. Try to get 20-30 g of fiber per day. Eat 4-5 servings of nuts, legumes, and seeds per week: 1 serving of dried beans or legumes equals  cup (90 g) cooked. 1 serving of nuts is  oz (12 almonds, 24 pistachios, or 7 walnut halves). 1 serving of seeds equals  oz (8 g). General information Eat more home-cooked food. Eat less restaurant, buffet, and fast food. Limit or avoid alcohol. Limit foods that are high in starch and sugar. Avoid fried foods. Lose weight if you are overweight. Keep track of how much salt (sodium) you eat. This is important if you have high blood pressure. Ask your doctor to tell you more about this. Try to add vegetarian meals each week. Fats Choose healthy fats. These include olive oil and canola oil, flaxseeds, walnuts, almonds, and seeds. Eat more omega-3 fats. These include salmon, mackerel, sardines, tuna, flaxseed oil, and ground flaxseeds. Try to eat fish at least  2 times each week. Check food labels. Avoid foods with trans fats or high amounts of saturated fat. Limit saturated fats. These are often found in animal products, such as meats, butter, and cream. These are also found in plant foods, such as palm oil, palm kernel oil, and coconut oil. Avoid foods with partially hydrogenated oils in them. These have trans fats. Examples are stick margarine, some tub margarines, cookies, crackers, and other baked goods. What foods should I eat? Fruits All fresh, canned (in natural juice), or frozen fruits. Vegetables Fresh or frozen vegetables (raw, steamed, roasted, or grilled). Green salads. Grains Most grains. Choose whole wheat and whole grains most of the time. Rice and pasta, including brown rice and pastas made with whole wheat. Meats and other proteins Lean, well-trimmed beef, veal, pork, and lamb. Chicken  and [place] without skin. All fish and shellfish. Wild duck, rabbit, pheasant, and venison. Egg whites or low-cholesterol egg substitutes. Dried beans, peas, lentils, and tofu. Seeds and most nuts. Dairy Low-fat or nonfat cheeses, including ricotta and mozzarella. Skim or 1% milk that is liquid, powdered, or evaporated. Buttermilk that is made with low-fat milk. Nonfat or low-fat yogurt. Fats and oils Non-hydrogenated (trans-free) margarines. Vegetable oils, including soybean, sesame, sunflower, olive, peanut, safflower, corn, canola, and cottonseed. Salad dressings or mayonnaise made with a vegetable oil. Beverages Mineral water. Coffee and tea. Diet carbonated beverages. Sweets and desserts Sherbet, gelatin, and fruit ice. Small amounts of dark chocolate. Limit all sweets and desserts. Seasonings and condiments All seasonings and condiments. The items listed above may not be a complete list of foods and drinks you can eat. Contact a dietitian for more options. What foods should I avoid? Fruits Canned fruit in heavy syrup. Fruit in cream or butter sauce. Fried fruit. Limit coconut. Vegetables Vegetables cooked in cheese, cream, or butter sauce. Fried vegetables. Grains Breads that are made with saturated or trans fats, oils, or whole milk. Croissants. Sweet rolls. Donuts. High-fat crackers, such as cheese crackers. Meats and other proteins Fatty meats, such as hot dogs, ribs, sausage, bacon, rib-eye roast or steak. High-fat deli meats, such as salami and bologna. Caviar. Domestic duck and goose. Organ meats, such as liver. Dairy Cream, sour cream, cream cheese, and creamed cottage cheese. Whole-milk cheeses. Whole or 2% milk that is liquid, evaporated, or condensed. Whole buttermilk. Cream sauce or high-fat cheese sauce. Yogurt that is made from whole milk. Fats and oils Meat fat, or shortening. Cocoa butter, hydrogenated oils, palm oil, coconut oil, palm kernel oil. Solid fats and  shortenings, including bacon fat, salt pork, lard, and butter. Nondairy cream substitutes. Salad dressings with cheese or sour cream. Beverages Regular sodas and juice drinks with added sugar. Sweets and desserts Frosting. Pudding. Cookies. Cakes. Pies. Milk chocolate or white chocolate. Buttered syrups. Full-fat ice cream or ice cream drinks. The items listed above may not be a complete list of foods and drinks to avoid. Contact a dietitian for more information. Summary Heart-healthy meal planning includes eating less unhealthy fats, eating more healthy fats, and making other changes in your diet. Eat a balanced diet. This includes fruits and vegetables, low-fat or nonfat dairy, lean protein, nuts and legumes, whole grains, and heart-healthy oils and fats. This information is not intended to replace advice given to you by your health care provider. Make sure you discuss any questions you have with your health care provider. Document Revised: 02/14/2021 Document Reviewed: 02/14/2021 Elsevier Patient Education  [place].  Adopting a Healthy Lifestyle.  Know what a healthy weight is for you (roughly BMI <25) and aim to maintain this   Aim for 7+ servings of fruits and vegetables daily   65-80+ fluid ounces of water or unsweet tea for healthy kidneys   Limit to max 1 drink of alcohol per day; avoid smoking/tobacco   Limit animal fats in diet for cholesterol and heart health - choose grass fed whenever available   Avoid highly processed foods, and foods high in saturated/trans fats   Aim for low stress - take time to unwind and care for your mental health   Aim for 150 min of moderate intensity exercise weekly for heart health, and weights twice weekly for bone health   Aim for 7-9 hours of sleep daily   When it comes to diets, agreement about the perfect plan isnt easy to find, even among the experts. Experts at the [employer] developed an idea  known as the Healthy Eating Plate. Just imagine a plate divided into logical, healthy portions.   The emphasis is on diet quality:   Load up on vegetables and fruits - one-half of your plate: Aim for color and variety, and remember that potatoes dont count.   Go for whole grains - one-quarter of your plate: Whole wheat, barley, wheat berries, quinoa, oats, brown rice, and foods made with them. If you want pasta, go with whole wheat pasta.   Protein power - one-quarter of your plate: Fish, chicken, beans, and nuts are all healthy, versatile protein sources. Limit red meat.   The diet, however, does go beyond the plate, offering a few other suggestions.   Use healthy plant oils, such as olive, canola, soy, corn, sunflower and peanut. Check the labels, and avoid partially hydrogenated oil, which have unhealthy trans fats.   If youre thirsty, drink water. Coffee and tea are good in moderation, but skip sugary drinks and limit milk and dairy products to one or two daily servings.   The type of carbohydrate in the diet is more important than the amount. Some sources of carbohydrates, such as vegetables, fruits, whole grains, and beans-are healthier than others.   Finally, stay active  Signed, Berniece Salines, DO  02/18/2022 8:47 PM    [facility]

## 2022-02-16 LAB — BASIC METABOLIC PANEL
BUN/Creatinine Ratio: 25 (ref 12–28)
BUN: 15 mg/dL (ref 8–27)
CO2: 23 mmol/L (ref 20–29)
Calcium: 10.3 mg/dL (ref 8.7–10.3)
Chloride: 103 mmol/L (ref 96–106)
Creatinine, Ser: 0.61 mg/dL (ref 0.57–1.00)
Glucose: 127 mg/dL — ABNORMAL HIGH (ref 70–99)
Potassium: 4.4 mmol/L (ref 3.5–5.2)
Sodium: 141 mmol/L (ref 134–144)
eGFR: 95 mL/min/{1.73_m2} (ref >59)

## 2022-02-16 LAB — LIPID PANEL
Chol/HDL Ratio: 1.7 ratio (ref 0.0–4.4)
Cholesterol, Total: 119 mg/dL (ref 100–199)
HDL: 70 mg/dL (ref >39)
LDL Chol Calc (NIH): 19 mg/dL (ref 0–99)
Triglycerides: 200 mg/dL — ABNORMAL HIGH (ref 0–149)
VLDL Cholesterol Cal: 30 mg/dL (ref 5–40)

## 2022-02-16 LAB — MAGNESIUM: Magnesium: 2.1 mg/dL (ref 1.6–2.3)

## 2022-02-20 DIAGNOSIS — M79642 Pain in left hand: Secondary | ICD-10-CM | POA: Diagnosis not present

## 2022-02-20 DIAGNOSIS — M79641 Pain in right hand: Secondary | ICD-10-CM | POA: Diagnosis not present

## 2022-02-20 DIAGNOSIS — M65332 Trigger finger, left middle finger: Secondary | ICD-10-CM | POA: Diagnosis not present

## 2022-02-20 DIAGNOSIS — M65331 Trigger finger, right middle finger: Secondary | ICD-10-CM | POA: Diagnosis not present

## 2022-02-20 DIAGNOSIS — M13849 Other specified arthritis, unspecified hand: Secondary | ICD-10-CM | POA: Diagnosis not present

## 2022-02-21 ENCOUNTER — Telehealth (HOSPITAL_COMMUNITY): Payer: Self-pay | Admitting: Emergency Medicine

## 2022-02-21 NOTE — Telephone Encounter (Signed)
Reaching out to patient to offer assistance regarding upcoming cardiac imaging study; pt verbalizes understanding of appt date/time, parking situation and where to check in, pre-test NPO status and medications ordered, and verified current allergies; name and call back number provided for further questions should they arise Michelle Arata RN Navigator Cardiac Imaging [facility] [phone] office [phone] cell  Arrival 1230 WC entrance 100mg metoprolol tartrate  Denies iv issues Aware contrast/nitro 

## 2022-02-22 ENCOUNTER — Ambulatory Visit (HOSPITAL_COMMUNITY)
Admission: RE | Admit: 2022-02-22 | Discharge: 2022-02-22 | Disposition: A | Discharge status: Home / Self Care | Payer: Medicare Other | Source: Ambulatory Visit | Attending: Cardiology | Admitting: Cardiology

## 2022-02-22 DIAGNOSIS — N6489 Other specified disorders of breast: Secondary | ICD-10-CM | POA: Insufficient documentation

## 2022-02-22 DIAGNOSIS — R079 Chest pain, unspecified: Secondary | ICD-10-CM | POA: Insufficient documentation

## 2022-02-22 DIAGNOSIS — I2510 Atherosclerotic heart disease of native coronary artery without angina pectoris: Secondary | ICD-10-CM | POA: Diagnosis not present

## 2022-02-22 MED ORDER — IOHEXOL 350 MG/ML SOLN
100.0000 mL | Freq: Once | INTRAVENOUS | Status: AC | PRN
  Administered 2022-02-22: 100 mL via INTRAVENOUS

## 2022-02-22 MED ORDER — NITROGLYCERIN 0.4 MG SL SUBL
0.8000 mg | SUBLINGUAL_TABLET | SUBLINGUAL | Status: DC | PRN
  Administered 2022-02-22: 0.8 mg via SUBLINGUAL

## 2022-02-23 ENCOUNTER — Other Ambulatory Visit: Payer: Self-pay

## 2022-02-23 MED ORDER — ICOSAPENT ETHYL 1 G PO CAPS: 1.0000 g | ORAL_CAPSULE | Freq: Two times a day (BID) | ORAL | 3 refills | Status: DC

## 2022-02-23 NOTE — Progress Notes (Signed)
Prescription sent to pharmacy.

## 2022-03-02 ENCOUNTER — Telehealth: Payer: Self-pay | Admitting: Family Medicine

## 2022-03-02 ENCOUNTER — Telehealth: Payer: Self-pay

## 2022-03-02 ENCOUNTER — Other Ambulatory Visit: Payer: Self-pay

## 2022-03-02 DIAGNOSIS — E119 Type 2 diabetes mellitus without complications: Secondary | ICD-10-CM

## 2022-03-02 DIAGNOSIS — E785 Hyperlipidemia, unspecified: Secondary | ICD-10-CM

## 2022-03-02 DIAGNOSIS — E039 Hypothyroidism, unspecified: Secondary | ICD-10-CM

## 2022-03-02 DIAGNOSIS — E1159 Type 2 diabetes mellitus with other circulatory complications: Secondary | ICD-10-CM

## 2022-03-02 NOTE — Telephone Encounter (Signed)
Patient has a lab appt 03/08/2022, there are No orders in.

## 2022-03-02 NOTE — Telephone Encounter (Signed)
Orders sent to dr Volanda Napoleon for confirmation

## 2022-03-06 ENCOUNTER — Ambulatory Visit: Payer: Medicare Other | Attending: General Surgery

## 2022-03-06 VITALS — Wt 188.5 lb

## 2022-03-06 DIAGNOSIS — Z483 Aftercare following surgery for neoplasm: Secondary | ICD-10-CM | POA: Insufficient documentation

## 2022-03-06 DIAGNOSIS — D2271 Melanocytic nevi of right lower limb, including hip: Secondary | ICD-10-CM | POA: Diagnosis not present

## 2022-03-06 DIAGNOSIS — Z08 Encounter for follow-up examination after completed treatment for malignant neoplasm: Secondary | ICD-10-CM | POA: Diagnosis not present

## 2022-03-06 DIAGNOSIS — Z1283 Encounter for screening for malignant neoplasm of skin: Secondary | ICD-10-CM | POA: Diagnosis not present

## 2022-03-06 DIAGNOSIS — D485 Neoplasm of uncertain behavior of skin: Secondary | ICD-10-CM | POA: Diagnosis not present

## 2022-03-06 DIAGNOSIS — Z85820 Personal history of malignant melanoma of skin: Secondary | ICD-10-CM | POA: Diagnosis not present

## 2022-03-06 DIAGNOSIS — D225 Melanocytic nevi of trunk: Secondary | ICD-10-CM | POA: Diagnosis not present

## 2022-03-06 NOTE — Therapy (Signed)
OUTPATIENT PHYSICAL THERAPY SOZO SCREENING NOTE   Patient Name: Michelle Sherman MRN: [id number] DOB:[birth year], 72 y.o., female Today's Date: 03/06/2022  PCP: Carollee Leitz, MD REFERRING PROVIDER: [name], MD   PT End of Session - 03/06/22 1531     Visit Number 5   # unchanged due to screen only   PT Start Time 1528    PT Stop Time 1535    PT Time Calculation (min) 7 min    Activity Tolerance Patient tolerated treatment well    Behavior During Therapy [facility] for tasks assessed/performed             Past Medical History:  Diagnosis Date   Acquired trigger finger of left middle finger 04/11/2021   Acquired trigger finger of right index finger 04/11/2021   Acquired trigger finger of right middle finger 04/11/2021   Allergy    ANA positive 08/16/2021   Arthritis    Breast cancer ([place]) 08/2019   right breast IDC   Cataract    Complication of anesthesia    unable to void after surgery and had to stay overnight    Diabetes mellitus without complication ([place])    Diverticulosis of colon 10/17/2010   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 lfts in 100s, resolved with cessation of lipitor. Normal liver ultrasound   Elevated CK 08/15/2021   Family history of adverse reaction to anesthesia    sister, hard to wake up    Family history of lung cancer    Family history of pancreatic cancer    Family history of pancreatic cancer    Genetic testing 10/06/2019   Negative genetic testing:  No pathogenic variants detected on the [facility] Breast Cancer STAT Panel + Common Hereditary Cancers Panel. 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 The report date is 10/05/2019.     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   Palpitations 01/27/2021   Personal history of radiation therapy    Severe obesity (BMI 35.0-35.9 with comorbidity) ([place]) 11/01/2016   Sleep apnea    wears CPAP nightly   Thyroid disease    Ulcerative colitis ([place])    Past Surgical History:  Procedure Laterality Date   BREAST LUMPECTOMY Right 10/2019   BREAST LUMPECTOMY WITH RADIOACTIVE SEED AND SENTINEL LYMPH NODE BIOPSY Right 10/14/2019   Procedure: RIGHT BREAST LUMPECTOMY WITH RADIOACTIVE SEED AND SENTINEL LYMPH NODE BIOPSY;  Surgeon: [name], MD;  Location: [place];  Service: General;  Laterality: Right;   CHOLECYSTECTOMY     RE-EXCISION OF BREAST LUMPECTOMY Right 11/11/2019   Procedure: RIGHT RE-EXCISION OF BREAST LUMPECTOMY;  Surgeon: [name], MD;  Location: [place];  Service: General;  Laterality: Right;  RNFA   SKIN SURGERY  12/2015   Fatty tumor removed   [place]   Patient Active Problem List   Diagnosis Date Noted   Obesity, Class II, BMI 35-39.9, isolated (see actual BMI) 12/31/2021   Rash 12/31/2021   Need for immunization against influenza 12/31/2021   Bilateral carpal tunnel syndrome 04/12/2021  Hand arthritis 01/27/2021   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   Seroma of breast 09/10/2020   Malignant neoplasm of upper-outer quadrant of right breast in female, estrogen receptor positive ([place]) 09/18/2019   Controlled type 2 diabetes mellitus without complication, without long-term current use of insulin ([place]) 11/01/2016   Hypertension associated with diabetes ([place]) 11/01/2016   Combined hyperlipidemia associated with type 2 diabetes mellitus ([place]) 01/15/2012   Allergic rhinitis 10/05/2011   Ulcerative colitis without complications ([place]) 123XX123    Acquired hypothyroidism 10/04/2010   Osteoarthrosis of knee 10/04/2010   OSA on CPAP 06/28/2010    REFERRING DIAG: right breast cancer at risk for lymphedema  THERAPY DIAG: Aftercare following surgery for neoplasm  PERTINENT HISTORY: Patient was diagnosed on 08/22/2019 with 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 She had a right lumpectomy and sentinel node biopsy (2 negative nodes) on 10/14/2019. It is ER/PR positive and HER2 negative with a Ki67 of 5%. She cares for her husband who is in a motorized wheelchair and has bilateral BKA.   PRECAUTIONS: right UE Lymphedema risk, None  SUBJECTIVE: Pt returns for her 3 month L-Dex screen. "I had another surgery since I was here. 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 No  SOZO SCREENING: Patient was assessed today using the SOZO machine to determine the lymphedema index score. This was compared to her baseline score. It was determined that she is within the recommended range when compared to her baseline and no further action is needed at this time. She will continue SOZO screenings. These are done every 3 months for 2 years post operatively followed by every 6 months for 2 years, and then annually.   Going to have pt come back in 3 months one more time since having another surgery with another lymph node removed.    L-DEX FLOWSHEETS - 03/06/22 1500       L-DEX LYMPHEDEMA SCREENING   Measurement Type Unilateral    L-DEX MEASUREMENT EXTREMITY Upper Extremity    POSITION  Standing    DOMINANT SIDE Right    At Risk Side Right    BASELINE SCORE (UNILATERAL) -2.2    L-DEX SCORE (UNILATERAL) 2.9    VALUE CHANGE (UNILAT) 5.1              [name], PTA 03/06/2022, 3:35 PM

## 2022-03-08 ENCOUNTER — Other Ambulatory Visit (INDEPENDENT_AMBULATORY_CARE_PROVIDER_SITE_OTHER): Payer: Medicare Other

## 2022-03-08 DIAGNOSIS — E119 Type 2 diabetes mellitus without complications: Secondary | ICD-10-CM

## 2022-03-08 DIAGNOSIS — E039 Hypothyroidism, unspecified: Secondary | ICD-10-CM | POA: Diagnosis not present

## 2022-03-08 DIAGNOSIS — E785 Hyperlipidemia, unspecified: Secondary | ICD-10-CM

## 2022-03-08 DIAGNOSIS — M65332 Trigger finger, left middle finger: Secondary | ICD-10-CM | POA: Diagnosis not present

## 2022-03-08 DIAGNOSIS — M65341 Trigger finger, right ring finger: Secondary | ICD-10-CM | POA: Diagnosis not present

## 2022-03-08 DIAGNOSIS — M79642 Pain in left hand: Secondary | ICD-10-CM | POA: Diagnosis not present

## 2022-03-08 DIAGNOSIS — Z853 Personal history of malignant neoplasm of breast: Secondary | ICD-10-CM | POA: Diagnosis not present

## 2022-03-08 DIAGNOSIS — M65331 Trigger finger, right middle finger: Secondary | ICD-10-CM | POA: Diagnosis not present

## 2022-03-08 LAB — HEPATIC FUNCTION PANEL
ALT: 12 U/L (ref 0–35)
AST: 13 U/L (ref 0–37)
Albumin: 4.2 g/dL (ref 3.5–5.2)
Alkaline Phosphatase: 76 U/L (ref 39–117)
Bilirubin, Direct: 0.1 mg/dL (ref 0.0–0.3)
Total Bilirubin: 0.4 mg/dL (ref 0.2–1.2)
Total Protein: 6.8 g/dL (ref 6.0–8.3)

## 2022-03-08 LAB — CBC WITH DIFFERENTIAL/PLATELET
Basophils Absolute: 0.1 10*3/uL (ref 0.0–0.1)
Basophils Relative: 1 % (ref 0.0–3.0)
Eosinophils Absolute: 0.2 10*3/uL (ref 0.0–0.7)
Eosinophils Relative: 4 % (ref 0.0–5.0)
HCT: 43.8 % (ref 36.0–46.0)
Hemoglobin: 14.6 g/dL (ref 12.0–15.0)
Lymphocytes Relative: 28.6 % (ref 12.0–46.0)
Lymphs Abs: 1.7 10*3/uL (ref 0.7–4.0)
MCHC: 33.3 g/dL (ref 30.0–36.0)
MCV: 90.6 fl (ref 78.0–100.0)
Monocytes Absolute: 0.4 10*3/uL (ref 0.1–1.0)
Monocytes Relative: 7.3 % (ref 3.0–12.0)
Neutro Abs: 3.5 10*3/uL (ref 1.4–7.7)
Neutrophils Relative %: 59.1 % (ref 43.0–77.0)
Platelets: 290 10*3/uL (ref 150.0–400.0)
RBC: 4.84 Mil/uL (ref 3.87–5.11)
RDW: 13.9 % (ref 11.5–15.5)
WBC: 5.9 10*3/uL (ref 4.0–10.5)

## 2022-03-08 LAB — HEMOGLOBIN A1C: Hgb A1c MFr Bld: 7.2 % — ABNORMAL HIGH (ref 4.6–6.5)

## 2022-03-08 LAB — TSH: TSH: 2.41 u[IU]/mL (ref 0.35–5.50)

## 2022-03-08 NOTE — Telephone Encounter (Signed)
Labs ordered.

## 2022-03-14 NOTE — Progress Notes (Signed)
SUBJECTIVE:   Chief Complaint  Patient presents with   Annual Exam    Bump on vagina   HPI Patient presents to clinic for annual physical  No acute concerns today.  Has noticed a bump on vaginal area.  Non tender and not draining.  Denies any fevers or vaginal discharge. Not currently sexually active  DM Type 2 Asymptomatic.  No hypoglycemic events.  Tolerating Jardiance and Metformin. On ACE.  No statin secondary to intolerance.  HTN Asymptomatic.  Compliant with current medication.  HLD. Reviewed recent labs.  LDL at goal. Tolerating Repatha.  Doing well No recent falls Requires Tetanus booster  Review of Systems - General ROS: negative    PERTINENT PMH / PSH: DM Type 2 HTN HLD  OBJECTIVE:  BP 110/70   Pulse 67   Temp 98.3 F (36.8 C) (Oral)   Ht 5' (1.524 m)   Wt 185 lb (83.9 kg)   SpO2 98%   BMI 36.13 kg/m    Physical Exam Vitals reviewed.  Constitutional:      General: She is not in acute distress.    Appearance: She is not ill-appearing.  HENT:     Head: Normocephalic.     Right Ear: Tympanic membrane, ear canal and external ear normal.     Left Ear: Tympanic membrane, ear canal and external ear normal.     Nose: Nose normal.     Mouth/Throat:     Mouth: Mucous membranes are moist.  Eyes:     Extraocular Movements: Extraocular movements intact.     Conjunctiva/sclera: Conjunctivae normal.     Pupils: Pupils are equal, round, and reactive to light.  Neck:     Vascular: No carotid bruit.  Cardiovascular:     Rate and Rhythm: Normal rate and regular rhythm.     Pulses: Normal pulses.     Heart sounds: Normal heart sounds.  Pulmonary:     Effort: Pulmonary effort is normal.     Breath sounds: Normal breath sounds.  Abdominal:     General: Bowel sounds are normal. There is no distension.     Palpations: Abdomen is soft.     Tenderness: There is no abdominal tenderness. There is no right CVA tenderness, left CVA tenderness, guarding or  rebound.  Musculoskeletal:        General: Normal range of motion.     Cervical back: Normal range of motion.     Right lower leg: No edema.     Left lower leg: No edema.  Lymphadenopathy:     Cervical: No cervical adenopathy.  Skin:    Capillary Refill: Capillary refill takes less than 2 seconds.  Neurological:     General: No focal deficit present.     Mental Status: She is alert and oriented to person, place, and time. Mental status is at baseline.     Motor: No weakness.  Psychiatric:        Mood and Affect: Mood normal.        Behavior: Behavior normal.        Thought Content: Thought content normal.        Judgment: Judgment normal.     ASSESSMENT/PLAN:  Diabetes mellitus type 2 with complications (HCC) Assessment & Plan: Chronic. Recent A1c 7.2 Continue Jardiance 25 mg daily Continue Metformin 1000 mg BID Urine ACR On ACEi and PSCK9   Orders: -     Microalbumin / creatinine urine ratio  Hypertension associated with diabetes ([place]) Assessment &  Plan: Chronic. Well-controlled. Continue ramipril 10 mg daily Follow-up 6 months   Need for tetanus booster -     Tdap vaccine greater than or equal to 7yo IM  Ulcerative colitis without complications, unspecified location [facility]) Assessment & Plan: Recent Colonoscopy 2022 showed diverticulitis, otherwise normal colon.    Repeat Colonoscopy in 2023   Acquired hypothyroidism Assessment & Plan: Chronic. Normal TSH at last annual. Continue levothyroxine 112 mcg daily    Statin myopathy Assessment & Plan: History of elevated LFT's and myalgia   Annual physical exam Assessment & Plan: HCM Recommend Diabetic Eye exam Medicare Annual Wellness due 06/24 Foot Exam due 07/24 Colonoscopy due 08/24 Mammogram due 08/24 Tdap today.     PDMP reviewed  Return in about 6 months (around 09/13/2022) for PCP.  Carollee Leitz, MD

## 2022-03-14 NOTE — Patient Instructions (Incomplete)
It was a pleasure meeting you today. Thank you for allowing me to take part in your health care.  Our goals for today as we discussed include:  You are due for an eye exam.  Please make sure that you call your eye doctor to have this scheduled and have them fax the results to our office.   You received your tetanus vaccine today.  This is due every 10 years.  Please have your eye doctor send your last eye exam results to the clinic to update your chart.     If you have any questions or concerns, please do not hesitate to call the office at [phone].  I look forward to our next visit and until then take care and stay safe.  Regards,   Carollee Leitz, MD   [facility]

## 2022-03-15 ENCOUNTER — Encounter: Payer: Self-pay | Admitting: Family Medicine

## 2022-03-15 ENCOUNTER — Ambulatory Visit (INDEPENDENT_AMBULATORY_CARE_PROVIDER_SITE_OTHER): Payer: Medicare Other | Admitting: Family Medicine

## 2022-03-15 VITALS — BP 110/70 | HR 67 | Temp 98.3°F | Ht 60.0 in | Wt 185.0 lb

## 2022-03-15 DIAGNOSIS — E1159 Type 2 diabetes mellitus with other circulatory complications: Secondary | ICD-10-CM | POA: Diagnosis not present

## 2022-03-15 DIAGNOSIS — E119 Type 2 diabetes mellitus without complications: Secondary | ICD-10-CM

## 2022-03-15 DIAGNOSIS — T466X5A Adverse effect of antihyperlipidemic and antiarteriosclerotic drugs, initial encounter: Secondary | ICD-10-CM

## 2022-03-15 DIAGNOSIS — Z23 Encounter for immunization: Secondary | ICD-10-CM

## 2022-03-15 DIAGNOSIS — E118 Type 2 diabetes mellitus with unspecified complications: Secondary | ICD-10-CM | POA: Diagnosis not present

## 2022-03-15 DIAGNOSIS — I152 Hypertension secondary to endocrine disorders: Secondary | ICD-10-CM

## 2022-03-15 DIAGNOSIS — G720 Drug-induced myopathy: Secondary | ICD-10-CM

## 2022-03-15 DIAGNOSIS — K5190 Ulcerative colitis, unspecified, without complications: Secondary | ICD-10-CM

## 2022-03-15 DIAGNOSIS — Z0000 Encounter for general adult medical examination without abnormal findings: Secondary | ICD-10-CM | POA: Diagnosis not present

## 2022-03-15 DIAGNOSIS — E039 Hypothyroidism, unspecified: Secondary | ICD-10-CM | POA: Diagnosis not present

## 2022-03-16 LAB — MICROALBUMIN / CREATININE URINE RATIO
Creatinine,U: 29 mg/dL
Microalb Creat Ratio: 2.4 mg/g (ref 0.0–30.0)
Microalb, Ur: <0.7 mg/dL (ref 0.0–1.9)

## 2022-03-18 ENCOUNTER — Encounter: Payer: Self-pay | Admitting: Family Medicine

## 2022-03-18 DIAGNOSIS — E118 Type 2 diabetes mellitus with unspecified complications: Secondary | ICD-10-CM | POA: Insufficient documentation

## 2022-03-18 DIAGNOSIS — Z23 Encounter for immunization: Secondary | ICD-10-CM | POA: Insufficient documentation

## 2022-03-18 DIAGNOSIS — Z0000 Encounter for general adult medical examination without abnormal findings: Secondary | ICD-10-CM | POA: Insufficient documentation

## 2022-03-18 NOTE — Assessment & Plan Note (Signed)
History of elevated LFT's and myalgia

## 2022-03-18 NOTE — Assessment & Plan Note (Signed)
Recent Colonoscopy 2022 showed diverticulitis, otherwise normal colon.    Repeat Colonoscopy in 2023

## 2022-03-18 NOTE — Assessment & Plan Note (Signed)
Chronic. Well-controlled. Continue ramipril 10 mg daily Follow-up 6 months

## 2022-03-18 NOTE — Assessment & Plan Note (Signed)
Chronic. Normal TSH at last annual. Continue levothyroxine 112 mcg daily

## 2022-03-18 NOTE — Assessment & Plan Note (Signed)
HCM Recommend Diabetic Eye exam Medicare Annual Wellness due 06/24 Foot Exam due 07/24 Colonoscopy due 08/24 Mammogram due 08/24 Tdap today.

## 2022-03-18 NOTE — Assessment & Plan Note (Signed)
Chronic. Recent A1c 7.2 Continue Jardiance 25 mg daily Continue Metformin 1000 mg BID Urine ACR On ACEi and PSCK9

## 2022-03-22 NOTE — Addendum Note (Signed)
Addended by: Lanice Shirts on: 03/22/2022 07:23 AM   Modules accepted: Level of Service

## 2022-03-26 ENCOUNTER — Other Ambulatory Visit: Payer: Self-pay | Admitting: Nurse Practitioner

## 2022-03-26 DIAGNOSIS — Z170 Estrogen receptor positive status [ER+]: Secondary | ICD-10-CM

## 2022-04-24 ENCOUNTER — Telehealth: Payer: Self-pay | Admitting: Cardiology

## 2022-04-24 NOTE — Telephone Encounter (Signed)
Dr. Harriet Masson -  patient would like to switch to Dr. Johney Frame due to her husband is her patient if this is ok with you.

## 2022-04-28 DIAGNOSIS — I10 Essential (primary) hypertension: Secondary | ICD-10-CM | POA: Diagnosis not present

## 2022-04-28 DIAGNOSIS — G4733 Obstructive sleep apnea (adult) (pediatric): Secondary | ICD-10-CM | POA: Diagnosis not present

## 2022-05-31 DIAGNOSIS — C4361 Malignant melanoma of right upper limb, including shoulder: Secondary | ICD-10-CM | POA: Diagnosis not present

## 2022-06-05 ENCOUNTER — Ambulatory Visit: Attending: General Surgery

## 2022-06-05 VITALS — Wt 186.1 lb

## 2022-06-05 DIAGNOSIS — Z483 Aftercare following surgery for neoplasm: Secondary | ICD-10-CM | POA: Insufficient documentation

## 2022-06-05 NOTE — Therapy (Signed)
OUTPATIENT PHYSICAL THERAPY SOZO SCREENING NOTE   Patient Name: Michelle Sherman MRN: [id number] DOB:06/11/1950, 72 y.o., female Today's Date: 06/05/2022  PCP: Dana Allan, MD REFERRING PROVIDER: Almond Lint, MD   PT End of Session - 06/05/22 1533     Visit Number 5   # unchanged due to screen only   PT Start Time 1532    PT Stop Time 1536    PT Time Calculation (min) 4 min    Activity Tolerance Patient tolerated treatment well    Behavior During Therapy [facility] for tasks assessed/performed             Past Medical History:  Diagnosis Date   Acquired trigger finger of left middle finger 04/11/2021   Acquired trigger finger of right index finger 04/11/2021   Acquired trigger finger of right middle finger 04/11/2021   Allergy    ANA positive 08/16/2021   Arthritis    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 lfts in 100s, resolved with cessation of lipitor. Normal liver ultrasound   Elevated CK 08/15/2021   Family history of adverse reaction to anesthesia    sister, hard to wake up    Family history of lung cancer    Family history of pancreatic cancer    Family history of pancreatic cancer    Genetic testing 10/06/2019   Negative genetic testing:  No pathogenic variants detected on the [facility] Breast Cancer STAT Panel + Common Hereditary Cancers Panel. 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 The report date is 10/05/2019.     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 I discussed option of draining seroma and ways to do this as well as risks.  I discussed that draining would likely make her breast less heavy feeling.  I reviewed that drainage would likely make the right breast smaller and the nipple point inferiorly.  I discussed that the seroma could recur and we would be in    Severe obesity (BMI 35.0-35.9 with comorbidity) (HCC) 11/01/2016   Sleep apnea    wears CPAP nightly   Thyroid disease    Ulcerative colitis [facility])    Past Surgical History:  Procedure Laterality Date   BREAST LUMPECTOMY Right 10/2019   BREAST LUMPECTOMY WITH RADIOACTIVE SEED AND SENTINEL LYMPH NODE BIOPSY Right 10/14/2019   Procedure: RIGHT BREAST LUMPECTOMY WITH RADIOACTIVE SEED AND SENTINEL LYMPH NODE BIOPSY;  Surgeon: Almond Lint, MD;  Location: [facility];  Service: General;  Laterality: Right;  CHOLECYSTECTOMY     RE-EXCISION OF BREAST LUMPECTOMY Right 11/11/2019   Procedure: RIGHT RE-EXCISION OF BREAST LUMPECTOMY;  Surgeon: Almond Lint, MD;  Location: [facility];  Service: General;  Laterality: Right;  RNFA   SKIN SURGERY  12/2015   Fatty tumor removed   TONSILLECTOMY AND ADENOIDECTOMY  1956   Patient Active Problem List   Diagnosis Date Noted   Diabetes mellitus type 2 with complications (HCC) 03/18/2022   Need for tetanus booster 03/18/2022   Annual physical exam 03/18/2022   Obesity, Class II, BMI 35-39.9, isolated (see  actual BMI) 12/31/2021   Rash 12/31/2021   Bilateral carpal tunnel syndrome 04/12/2021   Hand arthritis 01/27/2021   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   Hypertension associated with diabetes (HCC) 11/01/2016   Combined hyperlipidemia associated with type 2 diabetes mellitus (HCC) 01/15/2012   Allergic rhinitis 10/05/2011   Ulcerative colitis without complications (HCC) 10/05/2011   Acquired hypothyroidism 10/04/2010   Osteoarthrosis of knee 10/04/2010   OSA on CPAP 06/28/2010    REFERRING DIAG: right breast cancer at risk for lymphedema  THERAPY DIAG: Aftercare following surgery for neoplasm  PERTINENT HISTORY: Patient was diagnosed on 08/22/2019 with 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 She had a right lumpectomy and sentinel node biopsy (2 negative nodes) on 10/14/2019. It is ER/PR positive and HER2 negative with a Ki67 of 5%. She cares for her husband who is in a motorized wheelchair and has bilateral BKA.   PRECAUTIONS: right UE Lymphedema risk, None  SUBJECTIVE: Pt returns for her 3 month L-Dex screen since having areas of melanoma and 1 more lymph node removed.  PAIN:  Are you having pain? No  SOZO SCREENING: Patient was assessed today using the SOZO machine to determine the lymphedema index score. This was compared to her baseline score. It was determined that she is within the recommended range when compared to her baseline and no further action is needed at this time. She will continue SOZO screenings. These are done every 3 months for 2 years post operatively followed by every 6 months for 2 years, and then annually.      L-DEX FLOWSHEETS - 06/05/22 1500       L-DEX LYMPHEDEMA SCREENING   Measurement Type Unilateral    L-DEX MEASUREMENT EXTREMITY Upper Extremity    POSITION  Standing    DOMINANT SIDE Right    At Risk Side Right    BASELINE SCORE (UNILATERAL) -2.2    L-DEX SCORE (UNILATERAL) 1    VALUE CHANGE (UNILAT) 3.2            P: Resume 6  month screens.   Hermenia Bers, PTA 06/05/2022, 3:35 PM

## 2022-06-12 ENCOUNTER — Other Ambulatory Visit: Payer: Self-pay

## 2022-06-12 DIAGNOSIS — D225 Melanocytic nevi of trunk: Secondary | ICD-10-CM | POA: Diagnosis not present

## 2022-06-12 DIAGNOSIS — Z170 Estrogen receptor positive status [ER+]: Secondary | ICD-10-CM

## 2022-06-12 DIAGNOSIS — Z1283 Encounter for screening for malignant neoplasm of skin: Secondary | ICD-10-CM | POA: Diagnosis not present

## 2022-06-12 DIAGNOSIS — Z08 Encounter for follow-up examination after completed treatment for malignant neoplasm: Secondary | ICD-10-CM | POA: Diagnosis not present

## 2022-06-12 DIAGNOSIS — Z85820 Personal history of malignant melanoma of skin: Secondary | ICD-10-CM | POA: Diagnosis not present

## 2022-06-13 ENCOUNTER — Other Ambulatory Visit: Payer: Self-pay

## 2022-06-13 ENCOUNTER — Inpatient Hospital Stay: Payer: Medicare PPO | Attending: Nurse Practitioner

## 2022-06-13 ENCOUNTER — Inpatient Hospital Stay (HOSPITAL_BASED_OUTPATIENT_CLINIC_OR_DEPARTMENT_OTHER): Payer: Medicare PPO | Admitting: Nurse Practitioner

## 2022-06-13 ENCOUNTER — Encounter: Payer: Self-pay | Admitting: Nurse Practitioner

## 2022-06-13 VITALS — BP 132/76 | HR 74 | Temp 97.9°F | Resp 17 | Ht 60.0 in | Wt 185.0 lb

## 2022-06-13 DIAGNOSIS — C50411 Malignant neoplasm of upper-outer quadrant of right female breast: Secondary | ICD-10-CM

## 2022-06-13 DIAGNOSIS — Z79811 Long term (current) use of aromatase inhibitors: Secondary | ICD-10-CM | POA: Diagnosis not present

## 2022-06-13 DIAGNOSIS — I10 Essential (primary) hypertension: Secondary | ICD-10-CM | POA: Insufficient documentation

## 2022-06-13 DIAGNOSIS — Z803 Family history of malignant neoplasm of breast: Secondary | ICD-10-CM | POA: Insufficient documentation

## 2022-06-13 DIAGNOSIS — K219 Gastro-esophageal reflux disease without esophagitis: Secondary | ICD-10-CM | POA: Insufficient documentation

## 2022-06-13 DIAGNOSIS — Z923 Personal history of irradiation: Secondary | ICD-10-CM | POA: Diagnosis not present

## 2022-06-13 DIAGNOSIS — Z7984 Long term (current) use of oral hypoglycemic drugs: Secondary | ICD-10-CM | POA: Insufficient documentation

## 2022-06-13 DIAGNOSIS — E1136 Type 2 diabetes mellitus with diabetic cataract: Secondary | ICD-10-CM | POA: Insufficient documentation

## 2022-06-13 DIAGNOSIS — Z7982 Long term (current) use of aspirin: Secondary | ICD-10-CM | POA: Diagnosis not present

## 2022-06-13 DIAGNOSIS — Z170 Estrogen receptor positive status [ER+]: Secondary | ICD-10-CM

## 2022-06-13 DIAGNOSIS — K5190 Ulcerative colitis, unspecified, without complications: Secondary | ICD-10-CM | POA: Insufficient documentation

## 2022-06-13 DIAGNOSIS — M2550 Pain in unspecified joint: Secondary | ICD-10-CM | POA: Insufficient documentation

## 2022-06-13 DIAGNOSIS — G4730 Sleep apnea, unspecified: Secondary | ICD-10-CM | POA: Diagnosis not present

## 2022-06-13 DIAGNOSIS — Z85820 Personal history of malignant melanoma of skin: Secondary | ICD-10-CM | POA: Insufficient documentation

## 2022-06-13 DIAGNOSIS — Z79890 Hormone replacement therapy (postmenopausal): Secondary | ICD-10-CM | POA: Insufficient documentation

## 2022-06-13 DIAGNOSIS — Z800 Family history of malignant neoplasm of digestive organs: Secondary | ICD-10-CM | POA: Diagnosis not present

## 2022-06-13 LAB — CBC WITH DIFFERENTIAL (CANCER CENTER ONLY)
Abs Immature Granulocytes: 0.03 10*3/uL (ref 0.00–0.07)
Basophils Absolute: 0 10*3/uL (ref 0.0–0.1)
Basophils Relative: 1 %
Eosinophils Absolute: 0.1 10*3/uL (ref 0.0–0.5)
Eosinophils Relative: 2 %
HCT: 45.4 % (ref 36.0–46.0)
Hemoglobin: 14.6 g/dL (ref 12.0–15.0)
Immature Granulocytes: 1 %
Lymphocytes Relative: 27 %
Lymphs Abs: 1.7 10*3/uL (ref 0.7–4.0)
MCH: 29.6 pg (ref 26.0–34.0)
MCHC: 32.2 g/dL (ref 30.0–36.0)
MCV: 91.9 fL (ref 80.0–100.0)
Monocytes Absolute: 0.4 10*3/uL (ref 0.1–1.0)
Monocytes Relative: 7 %
Neutro Abs: 3.9 10*3/uL (ref 1.7–7.7)
Neutrophils Relative %: 62 %
Platelet Count: 318 10*3/uL (ref 150–400)
RBC: 4.94 MIL/uL (ref 3.87–5.11)
RDW: 13.2 % (ref 11.5–15.5)
WBC Count: 6.2 10*3/uL (ref 4.0–10.5)
nRBC: 0 % (ref 0.0–0.2)

## 2022-06-13 LAB — CMP (CANCER CENTER ONLY)
ALT: 11 U/L (ref 0–44)
AST: 15 U/L (ref 15–41)
Albumin: 4.4 g/dL (ref 3.5–5.0)
Alkaline Phosphatase: 71 U/L (ref 38–126)
Anion gap: 10 (ref 5–15)
BUN: 16 mg/dL (ref 8–23)
CO2: 26 mmol/L (ref 22–32)
Calcium: 9.8 mg/dL (ref 8.9–10.3)
Chloride: 103 mmol/L (ref 98–111)
Creatinine: 0.59 mg/dL (ref 0.44–1.00)
GFR, Estimated: >60 mL/min (ref >60)
Glucose, Bld: 160 mg/dL — ABNORMAL HIGH (ref 70–99)
Potassium: 4 mmol/L (ref 3.5–5.1)
Sodium: 139 mmol/L (ref 135–145)
Total Bilirubin: 0.4 mg/dL (ref 0.3–1.2)
Total Protein: 7.1 g/dL (ref 6.5–8.1)

## 2022-06-13 MED ORDER — EXEMESTANE 25 MG PO TABS: 25.0000 mg | ORAL_TABLET | Freq: Every day | ORAL | 3 refills | Status: DC

## 2022-06-13 NOTE — Progress Notes (Signed)
Patient Care Team: Dana Allan, MD as PCP - General (Family Medicine) Waymon Budge, MD as Consulting Physician (Pulmonary Disease) Charna Elizabeth, MD as Consulting Physician (Gastroenterology) Sedalia Muta, PT as Physical Therapist (Physical Therapy) Vivi Barrack, DPM as Consulting Physician (Podiatry) Antony Contras, MD as Consulting Physician (Ophthalmology) Donnelly Angelica, RN as Oncology Nurse Navigator Pershing Proud, RN as Oncology Nurse Navigator Almond Lint, MD as Consulting Physician (General Surgery) Malachy Mood, MD as Consulting Physician (Hematology) Dorothy Puffer, MD as Consulting Physician (Radiation Oncology) Pollyann Samples, NP as Nurse Practitioner (Nurse Practitioner) Dahlia Byes, [facility] (Inactive) as Pharmacist (Pharmacist)   CHIEF COMPLAINT: Follow up right breast cancer   Oncology History Overview Note  Cancer Staging Malignant neoplasm of upper-outer quadrant of right breast in female, estrogen receptor positive (HCC) Staging form: Breast, AJCC 8th Edition - Clinical stage from 09/12/2019: Stage IA (cT1b, cN0, cM0, G1, ER+, PR+, HER2-) - Signed by Malachy Mood, MD on 09/23/2019    Malignant neoplasm of upper-outer quadrant of right breast in female, estrogen receptor positive (HCC) (Resolved)  09/05/2019 Mammogram   IMPRESSION: Indeterminate 9 x 6 x 6 mm mass involving the OUTER RIGHT breast at the 9 o'clock position approximately 5 cm from the nipple at POSTERIOR depth, located anterior to a normal appearing intramammary lymph Node.      09/12/2019 Cancer Staging   Staging form: Breast, AJCC 8th Edition - Clinical stage from 09/12/2019: Stage IA (cT1b, cN0, cM0, G1, ER+, PR+, HER2-) - Signed by Malachy Mood, MD on 09/23/2019   09/12/2019 Initial Biopsy   Diagnosis 1. Breast, right, needle core biopsy, 9 o'clock posterior - INVASIVE DUCTAL CARCINOMA WITH PAPILLARY FEATURES. 2. Breast, right, needle core biopsy, 9 o'clock anterior - INVASIVE DUCTAL  CARCINOMA WITH PAPILLARY FEATURES. Microscopic Comment 1. - 2. The specimens share similar morphologic features. E-cadherin is positive and CK5/6 is negative. P63, Calponin and SMM-1 demonstrate the absence of myoepithelium. Ancillary studies will be reported separately. Results reported to [facility] on 09/15/2019. Intradepartmental consultation (Dr. Berneice Heinrich).   09/12/2019 Receptors her2   1. PROGNOSTIC INDICATORS Results: IMMUNOHISTOCHEMICAL AND MORPHOMETRIC ANALYSIS PERFORMED MANUALLY The tumor cells are NEGATIVE for Her2 (1+). Estrogen Receptor: 99%, POSITIVE, STRONG STAINING INTENSITY Progesterone Receptor: 99%, POSITIVE, STRONG STAINING INTENSITY Proliferation Marker Ki67: 5%   09/18/2019 Initial Diagnosis   Malignant neoplasm of upper-outer quadrant of right breast in female, estrogen receptor positive (HCC)   10/05/2019 Genetic Testing   Negative genetic testing:  No pathogenic variants detected on the [facility] Breast Cancer STAT Panel + Common Hereditary Cancers Panel. 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 The report date is 10/05/2019.  The Breast Cancer STAT Panel offered by [facility] includes sequencing and deletion/duplication analysis for the following 9 genes:  ATM, BRCA1, BRCA2, CDH1, CHEK2, PALB2, PTEN, STK11 and TP53. The Common Hereditary Cancers Panel offered by [facility] includes sequencing and/or deletion duplication testing of the following 48 genes: APC, ATM, AXIN2, BARD1, BMPR1A, BRCA1, BRCA2, BRIP1, CDH1, CDK4, CDKN2A (p14ARF), CDKN2A (p16INK4a), CHEK2, CTNNA1, DICER1, EPCAM (Deletion/duplication testing only), GREM1 (promoter region deletion/duplication testing only), KIT, MEN1, MLH1, MSH2, MSH3, MSH6, MUTYH, NBN, NF1, NTHL1, PALB2, PDGFRA, PMS2, POLD1, POLE, PTEN, RAD50, RAD51C, RAD51D, RNF43, SDHB, SDHC, SDHD, SMAD4, SMARCA4. STK11, TP53, TSC1, TSC2, and VHL.  The following genes were evaluated for sequence  changes only: SDHA and HOXB13 c.251G>A variant only.    10/14/2019 Pathology Results   RIGHT BREAST LUMPECTOMY WITH  RADIOACTIVE SEED AND SENTINEL LYMPH NODE BIOPSY by Dr Donell Beers    FINAL MICROSCOPIC DIAGNOSIS:   A. BREAST, RIGHT, LUMPECTOMY:  - Multifocal invasive ductal carcinoma with papillary features, grade 2,  spanning 0.9 cm and 0.5 cm.  - Intermediate grade ductal carcinoma in situ.  - Invasive carcinoma present at original inferior margin broadly, final  inferior margin (Part F) is negative.  - Margins are negative for in situ carcinoma.  - Biopsy site.  - See oncology table.   B. BREAST, RIGHT ADDITIONAL POSTERIOR MARGIN, EXCISION:  - Benign breast tissue.   C. BREAST, RIGHT ADDITIONAL LATERAL MARGIN, EXCISION:  - Benign breast tissue.   D. BREAST, RIGHT ADDITIONAL SUPERIOR MARGIN, EXCISION:  - Benign breast tissue.   E. BREAST, RIGHT ADDITIONAL MEDIAL MARGIN, EXCISION:  - Invasive ductal carcinoma with papillary features, grade 2, spanning  0.5 cm.  - Invasive carcinoma is present at the new medial margin broadly.   F. BREAST, RIGHT ADDITIONAL INFERIOR MARGIN, EXCISION:  - Benign breast tissue.   G. LYMPH NODE, RIGHT AXILLARY #1, SENTINEL, EXCISION:  - One of one lymph nodes negative for carcinoma (0/1).   H. LYMPH NODE, RIGHT AXILLARY #2, SENTINEL, EXCISION:  - One of one lymph nodes negative for carcinoma (0/1).   10/14/2019 Oncotype testing   Oncotype  Recurrence Score 0 with distant recurrence risk at 9 years of 3%.  There is less then 1% benefit of chemotherapy   10/14/2019 Cancer Staging   Staging form: Breast, AJCC 8th Edition - Pathologic stage from 10/14/2019: Stage IA (pT1b, pN0, cM0, G2, ER+, PR+, HER2-, Oncotype DX score: 0) - Signed by Malachy Mood, MD on 01/29/2020   11/11/2019 Pathology Results   RIGHT RE-EXCISION OF BREAST LUMPECTOMY by Dr Donell Beers    FINAL MICROSCOPIC DIAGNOSIS:   A. BREAST, RIGHT, RE-EXCISION OF MEDIAL MARGIN:  - Prior  procedure site changes.  - No carcinoma identified.   COMMENT:   P63, Calponin and SMM-1 demonstrate the presence of myoepithelium in the  select focus.    12/22/2019 - 01/19/2020 Radiation Therapy   Adjuvant Radiation with Dr Mitzi Hansen    05/06/2020 Survivorship   SCP delivered by Santiago Glad, NP       CURRENT THERAPY: Anastrozole, starting 01/2020  INTERVAL HISTORY Ms. Cranor returns for follow up as scheduled. Last seen by me 12/20/21.  She has worsening joint pain in her hands, shoulders, right elbow, and knees.  Pain is especially bad in the morning.  Her hot flashes have eased up over time.  Due to her history of 2 cancers, melanoma and breast cancer, she is very motivated for any screening that is available to her and would like to restart pelvic exams with OB/GYN.  Denies vaginal bleeding or any other new specific complaints.  ROS  All other systems reviewed and negative  Past Medical History:  Diagnosis Date   Acquired trigger finger of left middle finger 04/11/2021   Acquired trigger finger of right index finger 04/11/2021   Acquired trigger finger of right middle finger 04/11/2021   Allergy    ANA positive 08/16/2021   Arthritis    Breast cancer (HCC) 08/2019   right breast IDC   Cataract    Complication of anesthesia    unable to void after surgery and had to stay overnight    Diabetes mellitus type 2, controlled, without complications (HCC) 11/01/2016   Diabetes mellitus without complication (HCC)    Diverticulosis of colon 10/17/2010   Drug-induced hepatitis - lipitor  2022 01/07/2021   Statin induced 2022, lipitor. lfts in 100s, resolved with cessation of lipitor. Normal liver ultrasound   Elevated CK 08/15/2021   Family history of adverse reaction to anesthesia    sister, hard to wake up    Family history of lung cancer    Family history of pancreatic cancer    Family history of pancreatic cancer    Genetic testing 10/06/2019   Negative genetic testing:  No  pathogenic variants detected on the [facility] Breast Cancer STAT Panel + Common Hereditary Cancers Panel. 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 The report date is 10/05/2019.     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 I discussed option of draining seroma and ways to do this as well as risks.  I discussed that draining would likely make her breast less heavy feeling.  I reviewed that drainage would likely make the right breast smaller and the nipple point inferiorly.  I discussed that the seroma could recur and we would be in    Severe obesity (BMI 35.0-35.9 with comorbidity) (HCC) 11/01/2016   Sleep apnea    wears CPAP nightly   Thyroid disease    Ulcerative colitis [facility])      Past Surgical History:  Procedure Laterality Date   BREAST LUMPECTOMY Right 10/2019   BREAST LUMPECTOMY WITH RADIOACTIVE SEED AND SENTINEL LYMPH NODE BIOPSY Right 10/14/2019   Procedure: RIGHT BREAST LUMPECTOMY WITH RADIOACTIVE SEED AND SENTINEL LYMPH NODE BIOPSY;  Surgeon: Almond Lint, MD;  Location: [facility];  Service: General;  Laterality: Right;   CHOLECYSTECTOMY     RE-EXCISION OF BREAST LUMPECTOMY Right 11/11/2019   Procedure: RIGHT RE-EXCISION OF BREAST LUMPECTOMY;  Surgeon: Almond Lint, MD;  Location: [facility];   Service: General;  Laterality: Right;  RNFA   SKIN SURGERY  12/2015   Fatty tumor removed   TONSILLECTOMY AND ADENOIDECTOMY  1956     Outpatient Encounter Medications as of 06/13/2022  Medication Sig   aspirin EC 81 MG tablet Take 1 tablet by mouth daily.   Biotin 2500 MCG CAPS Take 1 capsule by mouth daily.   Cholecalciferol (VITAMIN D3) 2000 units capsule Take 1 capsule by mouth daily.   empagliflozin (JARDIANCE) 25 MG TABS tablet Take 1 tablet (25 mg total) by mouth daily before breakfast. (Patient taking differently: Take 25 mg by mouth daily before breakfast. Has not been delivered. 08/18/21 HEI)   Evolocumab (REPATHA SURECLICK) 140 MG/ML SOAJ Inject 140 mg into the skin every 14 (fourteen) days.   exemestane (AROMASIN) 25 MG tablet Take 1 tablet (25 mg total) by mouth daily after breakfast.   ezetimibe (ZETIA) 10 MG tablet Take 1 tablet (10 mg total) by mouth daily.   famotidine (PEPCID) 40 MG tablet Take 40 mg by mouth 2 (two) times daily.   fexofenadine (ALLEGRA) 180 MG  tablet Take 180 mg by mouth daily.   fluticasone (FLONASE) 50 MCG/ACT nasal spray Place into the nose.   icosapent Ethyl (VASCEPA) 1 g capsule Take 1 capsule (1 g total) by mouth 2 (two) times daily.   levothyroxine (SYNTHROID) 112 MCG tablet Take 1 tablet (112 mcg total) by mouth daily.   mesalamine (LIALDA) 1.2 g EC tablet Take by mouth. Take 2 tablet in am and 2 tablet in pm   metFORMIN (GLUCOPHAGE) 1000 MG tablet Take 1 tablet (1,000 mg total) by mouth 2 (two) times daily with a meal.   Misc Natural Products (TURMERIC CURCUMIN) CAPS Take 1 capsule by mouth daily.   Multiple Vitamin (MULTIVITAMIN) tablet Take 1 tablet by mouth daily.   ramipril (ALTACE) 10 MG capsule Take 1 capsule (10 mg total) by mouth daily.   traZODone (DESYREL) 50 MG tablet Take 0.5-1 tablets (25-50 mg total) by mouth at bedtime as needed for sleep.   [DISCONTINUED] anastrozole (ARIMIDEX) 1 MG tablet TAKE ONE TABLET BY MOUTH ONE TIME DAILY    [DISCONTINUED] metoprolol tartrate (LOPRESSOR) 100 MG tablet Take 2 hours prior to CT (Patient not taking: Reported on 03/15/2022)   No facility-administered encounter medications on file as of 06/13/2022.     Today's Vitals   06/13/22 1032  BP: 132/76  Pulse: 74  Resp: 17  Temp: 97.9 F (36.6 C)  TempSrc: Temporal  SpO2: 99%  Weight: 185 lb (83.9 kg)  Height: 5' (1.524 m)   Body mass index is 36.13 kg/m.   PHYSICAL EXAM GENERAL:alert, no distress and comfortable SKIN: no rash  EYES: sclera clear NECK: without mass LYMPH:  no palpable cervical or supraclavicular lymphadenopathy  LUNGS:  normal breathing effort HEART: no lower extremity edema ABDOMEN: abdomen soft, non-tender and normal bowel sounds NEURO: alert & oriented x 3 with fluent speech, no focal motor/sensory deficits Breast exam: No nipple discharge or inversion.  S/p right lumpectomy, incisions completely healed with scar tissue.  Lumpy breast tissue bilaterally without discrete mass or nodularity in either breast or axilla that I could appreciate.   CBC    Component Value Date/Time   WBC 6.2 06/13/2022 1004   WBC 5.9 03/08/2022 0809   RBC 4.94 06/13/2022 1004   HGB 14.6 06/13/2022 1004   HCT 45.4 06/13/2022 1004   PLT 318 06/13/2022 1004   MCV 91.9 06/13/2022 1004   MCH 29.6 06/13/2022 1004   MCHC 32.2 06/13/2022 1004   RDW 13.2 06/13/2022 1004   LYMPHSABS 1.7 06/13/2022 1004   MONOABS 0.4 06/13/2022 1004   EOSABS 0.1 06/13/2022 1004   BASOSABS 0.0 06/13/2022 1004     CMP     Component Value Date/Time   NA 139 06/13/2022 1004   NA 141 02/15/2022 1038   K 4.0 06/13/2022 1004   CL 103 06/13/2022 1004   CO2 26 06/13/2022 1004   GLUCOSE 160 (H) 06/13/2022 1004   BUN 16 06/13/2022 1004   BUN 15 02/15/2022 1038   CREATININE 0.59 06/13/2022 1004   CREATININE 0.55 (L) 01/07/2020 0907   CALCIUM 9.8 06/13/2022 1004   PROT 7.1 06/13/2022 1004   ALBUMIN 4.4 06/13/2022 1004   AST 15 06/13/2022 1004    ALT 11 06/13/2022 1004   ALKPHOS 71 06/13/2022 1004   BILITOT 0.4 06/13/2022 1004   GFRNONAA >60 06/13/2022 1004   GFRNONAA 95 01/07/2020 0907   GFRAA 110 01/07/2020 0907     ASSESSMENT & PLAN:Kidada B Franklin is a 72 y.o. female with  1. Malignant neoplasm of upper-outer quadrant of right breast, invasive ductal carcinoma, Stage 1A, pT1bN0M0, ER+/PR+/HER2-, Grade 2, 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 -S/p right lumpectomy with Dr Donell Beers on 10/14/19 and right breast re-excision on 11/11/19 due to positive margins. Oncotype showed RS 0, low risk. -she completed adjuvant Radiation 12/22/19-01/19/20 -Se began adjuvant anastrozole in 01/2020. She is tolerating well overall, except rare hot flash and moderate joint pain mainly in her hands today.  This is likely multifactorial, but also related to AI.  -Ms. Maqueda is clinically doing well.  Breast exam is benign, labs are unremarkable.  Overall no clinical concern for breast cancer recurrence. -Due to increasing joint pain from anastrozole and arthritis, we decided together to hold anastrozole for 2-4 weeks to see how pain responds.  If pain does not improve, she will restart anastrozole, but if it does improve she will start exemestane which I already called in. -She will let me know via MyChart which course she takes -Continue surveillance -Follow-up in 6 months, or sooner if needed   2. Right forearm melanoma, pT2b -S/p wide local excision and SLNB (used ax incision from lumpectomy) on 11/18/21 by [facility]   3. Arthritis, joint pain, trigger finger -she has arthritis at baseline, especially in her right knee. -Pain little worse in her hands, but manageable.  -Worsening on anastrozole.  See #1   4. Genetic Testing -Based on her family history of cancer, she is eligible for genetic testing.  -Her genetic testing was negative -Given her diagnosis of melanoma in addition to previous breast cancer, she is  interested in possibly repeat testing with RNA.  She understands this makes a difference in about 2% of patients per my previous discussion with Maylon Cos.   -Patient is interested and I referred her back   5. Comorbidities: HTN, DM, Hypothyroidism -Well controlled on Medications. -Continue to f/u with PCP for management   6. Bone Health -Her 07/01/20 DEXA was normal (+1.1). Will repeat every 2 years on AI. -Repeat this year with mammo    PLAN: -Labs reviewed -Continue breast cancer surveillance -Due to increasing joint pain, hold anastrozole for 2-4 weeks -If pain does not improve, she will restart anastrozole -If it does improve, start exemestane which I already called in. -She will let me know via MyChart which course she takes -F/up in 6 months, or sooner if needed -Referral back to genetics  -Referral to OB/gyn for screening/female exams   Orders Placed This Encounter  Procedures   MM DIAG BREAST TOMO BILATERAL    Standing Status:   Future    Standing Expiration Date:   06/13/2023    Order Specific Question:   Reason for Exam (SYMPTOM  OR DIAGNOSIS REQUIRED)    Answer:   h/o right breast cancer    Order Specific Question:   Preferred imaging location?    Answer:   [facility]   DG Bone Density    Standing Status:   Future    Standing Expiration Date:   06/13/2023    Order Specific Question:   Reason for Exam (SYMPTOM  OR DIAGNOSIS REQUIRED)    Answer:   osteopenia, on AI    Order Specific Question:   Preferred imaging location?    Answer:   [facility]   Ambulatory referral to Gynecology    Referral Priority:   Routine    Referral Type:   Consultation    Referral Reason:   Specialty Services Required  Requested Specialty:   Gynecology    Number of Visits Requested:   1      All questions were answered. The patient knows to call the clinic with any problems, questions or concerns. No barriers to learning were detected. I spent 20 minutes counseling the  patient face to face. The total time spent in the appointment was 30 minutes and more than 50% was on counseling, review of test results, and coordination of care.   Santiago Glad, NP-C 06/13/2022

## 2022-06-14 ENCOUNTER — Other Ambulatory Visit: Payer: Self-pay

## 2022-06-14 ENCOUNTER — Telehealth: Payer: Self-pay

## 2022-06-14 NOTE — Telephone Encounter (Signed)
Sent over a referral to Dr. Catalina Antigua at [facility] for [facility]. Will follow up to make sure it was received and follow up was made.

## 2022-06-22 DIAGNOSIS — K5190 Ulcerative colitis, unspecified, without complications: Secondary | ICD-10-CM | POA: Diagnosis not present

## 2022-06-22 DIAGNOSIS — R1033 Periumbilical pain: Secondary | ICD-10-CM | POA: Diagnosis not present

## 2022-06-22 DIAGNOSIS — K219 Gastro-esophageal reflux disease without esophagitis: Secondary | ICD-10-CM | POA: Diagnosis not present

## 2022-06-22 DIAGNOSIS — K5730 Diverticulosis of large intestine without perforation or abscess without bleeding: Secondary | ICD-10-CM | POA: Diagnosis not present

## 2022-06-27 ENCOUNTER — Encounter: Payer: Self-pay | Admitting: Genetic Counselor

## 2022-06-27 NOTE — Progress Notes (Signed)
UPDATE: MLH1 c.221A>T (p.Asp74Val) VUS has been amended to Benign. Amended report date is 05/08/2022.

## 2022-07-04 DIAGNOSIS — C50411 Malignant neoplasm of upper-outer quadrant of right female breast: Secondary | ICD-10-CM | POA: Diagnosis not present

## 2022-07-04 DIAGNOSIS — Z170 Estrogen receptor positive status [ER+]: Secondary | ICD-10-CM | POA: Diagnosis not present

## 2022-07-06 ENCOUNTER — Ambulatory Visit (INDEPENDENT_AMBULATORY_CARE_PROVIDER_SITE_OTHER): Payer: Medicare PPO | Admitting: Obstetrics and Gynecology

## 2022-07-06 ENCOUNTER — Encounter: Payer: Self-pay | Admitting: Obstetrics and Gynecology

## 2022-07-06 ENCOUNTER — Other Ambulatory Visit (HOSPITAL_COMMUNITY)
Admission: RE | Admit: 2022-07-06 | Discharge: 2022-07-06 | Disposition: A | Discharge status: Home / Self Care | Payer: Medicare PPO | Source: Ambulatory Visit | Attending: Obstetrics and Gynecology | Admitting: Obstetrics and Gynecology

## 2022-07-06 ENCOUNTER — Other Ambulatory Visit: Payer: Self-pay

## 2022-07-06 VITALS — BP 127/80 | HR 73 | Ht 60.0 in | Wt 183.0 lb

## 2022-07-06 DIAGNOSIS — Z1151 Encounter for screening for human papillomavirus (HPV): Secondary | ICD-10-CM | POA: Insufficient documentation

## 2022-07-06 DIAGNOSIS — Z01419 Encounter for gynecological examination (general) (routine) without abnormal findings: Secondary | ICD-10-CM | POA: Diagnosis not present

## 2022-07-06 DIAGNOSIS — Z124 Encounter for screening for malignant neoplasm of cervix: Secondary | ICD-10-CM

## 2022-07-06 DIAGNOSIS — N814 Uterovaginal prolapse, unspecified: Secondary | ICD-10-CM

## 2022-07-06 NOTE — Progress Notes (Signed)
NEW GYNECOLOGY PATIENT Patient name: Michelle Sherman MRN [id number]  Date of birth: [birth year] Chief Complaint:   Gynecologic Exam     History:  Michelle Sherman is a 72 y.o. No obstetric history on file. being seen today for gyn exam.  A few years ago told she had prlapse  - may notice it with wiping or showering but otherwise not bothered by it No spotting or discharge  Had 1 abnormal pap maybe 40 years ago - normal after redo  Also ntice a small bump w/in the lip of the vagina No tenderness to bump - has been there for likely a year No hair removal in that area Not quite sexually active- husband w/ health issues    Interested in returning to screenings/exams given malignancy hx     Gynecologic History No LMP recorded. Patient is postmenopausal. Contraception: post menopausal status Last Pap: unk, denis hx of abnormal paps Last Mammogram: 08/2021  BIRADS 2 Last Colonoscopy: n/a  Obstetric History OB History  No obstetric history on file.    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 lfts in 100s, resolved with cessation of lipitor. Normal liver ultrasound   Elevated CK 08/15/2021   Family history of adverse reaction to anesthesia    sister, hard to wake up    Family history of lung cancer    Family history of pancreatic cancer    Family history of  pancreatic cancer    Genetic testing 10/06/2019   Negative genetic testing:  No pathogenic variants detected on the [facility] Breast Cancer STAT Panel + Common Hereditary Cancers Panel. 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 The report date is 10/05/2019.     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 I discussed option of draining seroma and ways to do this as well as risks.  I discussed that draining would likely make her breast less heavy feeling.  I reviewed that drainage would likely make the right breast smaller and the nipple point inferiorly.  I discussed that the seroma could recur and we would be in    Severe obesity (BMI 35.0-35.9 with comorbidity) (HCC) 11/01/2016   Sleep apnea  wears CPAP nightly   Thyroid disease    Ulcerative colitis [facility])     Past Surgical History:  Procedure Laterality Date   BREAST LUMPECTOMY Right 10/2019   BREAST LUMPECTOMY WITH RADIOACTIVE SEED AND SENTINEL LYMPH NODE BIOPSY Right 10/14/2019   Procedure: RIGHT BREAST LUMPECTOMY WITH RADIOACTIVE SEED AND SENTINEL LYMPH NODE BIOPSY;  Surgeon: Almond Lint, MD;  Location: [facility];  Service: General;  Laterality: Right;   CHOLECYSTECTOMY     RE-EXCISION OF BREAST LUMPECTOMY Right 11/11/2019   Procedure: RIGHT RE-EXCISION OF  BREAST LUMPECTOMY;  Surgeon: Almond Lint, MD;  Location: [facility];  Service: General;  Laterality: Right;  RNFA   SKIN SURGERY  12/2015   Fatty tumor removed   TONSILLECTOMY AND ADENOIDECTOMY  1956    Current Outpatient Medications on File Prior to Visit  Medication Sig Dispense Refill   aspirin EC 81 MG tablet Take 1 tablet by mouth daily.     Biotin 2500 MCG CAPS Take 1 capsule by mouth daily.     Cholecalciferol (VITAMIN D3) 2000 units capsule Take 1 capsule by mouth daily.     empagliflozin (JARDIANCE) 25 MG TABS tablet Take 1 tablet (25 mg total) by mouth daily before breakfast. (Patient taking differently: Take 25 mg by mouth daily before breakfast. Has not been delivered. 08/18/21 HEI) 90 tablet 3   Evolocumab (REPATHA SURECLICK) 140 MG/ML SOAJ Inject 140 mg into the skin every 14 (fourteen) days. 6 mL 3   exemestane (AROMASIN) 25 MG tablet Take 1 tablet (25 mg total) by mouth daily after breakfast. 30 tablet 3   ezetimibe (ZETIA) 10 MG tablet Take 1 tablet (10 mg total) by mouth daily. 90 tablet 3   famotidine (PEPCID) 40 MG tablet Take 40 mg by mouth 2 (two) times daily.     fexofenadine (ALLEGRA) 180 MG tablet Take 180 mg by mouth daily.     fluticasone (FLONASE) 50 MCG/ACT nasal spray Place into the nose.     icosapent Ethyl (VASCEPA) 1 g capsule Take 1 capsule (1 g total) by mouth 2 (two) times daily. 180 capsule 3   levothyroxine (SYNTHROID) 112 MCG tablet Take 1 tablet (112 mcg total) by mouth daily. 90 tablet 3   mesalamine (LIALDA) 1.2 g EC tablet Take by mouth. Take 2 tablet in am and 2 tablet in pm     metFORMIN (GLUCOPHAGE) 1000 MG tablet Take 1 tablet (1,000 mg total) by mouth 2 (two) times daily with a meal. 180 tablet 3   Misc Natural Products (TURMERIC CURCUMIN) CAPS Take 1 capsule by mouth daily.     Multiple Vitamin (MULTIVITAMIN) tablet Take 1 tablet by mouth daily.     ramipril (ALTACE) 10 MG capsule Take 1 capsule (10 mg total) by mouth daily. 90  capsule 3   traZODone (DESYREL) 50 MG tablet Take 0.5-1 tablets (25-50 mg total) by mouth at bedtime as needed for sleep. 90 tablet 3   No current facility-administered medications on file prior to visit.    Allergies  Allergen Reactions   Nickel Other (See Comments) and Rash    drainage   Sulfa Antibiotics Rash   Lipitor [Atorvastatin] Other (See Comments)    Hepatitis and myopathy, lfts 100s and elevated CK with symptoms   Bydureon [Exenatide] Nausea And Vomiting    Social History:  reports that she has never smoked. She has never used smokeless tobacco. She reports current alcohol use. She reports that she does not use drugs.  Family History  Problem Relation Age of Onset   Ulcerative colitis Mother    Diabetes Mother    Heart disease Father        mid 48s   Lung cancer Father 66       Lung   Varicose Veins Sister    Other Sister        "stiff heart"   Diabetes Brother    Pancreatic cancer Brother 41       Pancreatic   Crohn's disease Brother    Ulcerative colitis Brother    Diabetes Brother    Cancer Brother        unsure of type   Heart attack Paternal Grandfather    Diabetes Maternal Aunt    Diabetes Maternal Uncle    Lung cancer Paternal Aunt        smoker   Cancer Paternal Aunt        unknown type    The following portions of the patient's history were reviewed and updated as appropriate: allergies, current medications, past family history, past medical history, past social history, past surgical history and problem list.  Review of Systems Pertinent items noted in HPI and remainder of comprehensive ROS otherwise negative.  Physical Exam:  BP 127/80   Pulse 73   Ht 5' (1.524 m)   Wt 183 lb (83 kg)   BMI 35.74 kg/m  Physical Exam Vitals and nursing note reviewed. Exam conducted with a chaperone present.  Constitutional:      Appearance: Normal appearance.  Cardiovascular:     Rate and Rhythm: Normal rate.  Pulmonary:     Effort: Pulmonary effort  is normal.     Breath sounds: Normal breath sounds.  Genitourinary:    Comments: Cervix at vaginal introitus, normal appearing Small uterus, mobile No adnexal masses Neurological:     General: No focal deficit present.     Mental Status: She is alert and oriented to person, place, and time.  Psychiatric:        Mood and Affect: Mood normal.        Behavior: Behavior normal.        Thought Content: Thought content normal.        Judgment: Judgment normal.        Assessment and Plan:   1. Well woman exam with routine gynecological exam - Cervical cancer screening: Discussed guidelines related to screening beyond 65. Based on guidelines, the patient meets criteria for cessation of pap smears.  Patient would like: to continue pap smears - Breast Health: Encouraged self-breast awareness/SBE. Discussed limits of clinical breast exam for detecting breast cancer. Discussed importance of annual MXR.  Followed by breast oncologist, up to date - Colonoscopy: up to date - F/U 12 months and prn   2. Screening for cervical cancer Pap collected - Cytology - PAP  3. Uterine prolapse Appears likely stage 2 uterine prolapse at rest without symptoms. Discussed that prolapse in and of itself is not dangerous. Briefly reviewed options of observation, pessary and surgery and that intervention not required until bothered by prolapse.   Routine preventative health maintenance measures emphasized. Please refer to After Visit Summary for other counseling recommendations.   Follow-up: Return in about 1 year (around 07/06/2023), or if symptoms worsen or fail to improve, for Annual GYN.      Lorriane Shire, MD Obstetrician & Gynecologist, Faculty [facility] [employer], [facility]

## 2022-07-09 ENCOUNTER — Encounter: Payer: Self-pay | Admitting: Family Medicine

## 2022-07-10 ENCOUNTER — Other Ambulatory Visit: Payer: Self-pay

## 2022-07-10 LAB — CYTOLOGY - PAP
Comment: NEGATIVE
Diagnosis: NEGATIVE
High risk HPV: NEGATIVE

## 2022-07-10 MED ORDER — EMPAGLIFLOZIN 25 MG PO TABS: 25.0000 mg | ORAL_TABLET | Freq: Every day | ORAL | 3 refills | Status: DC

## 2022-07-13 ENCOUNTER — Ambulatory Visit (INDEPENDENT_AMBULATORY_CARE_PROVIDER_SITE_OTHER): Payer: Medicare PPO

## 2022-07-13 VITALS — BP 127/80 | Ht 60.0 in | Wt 183.0 lb

## 2022-07-13 DIAGNOSIS — Z0000 Encounter for general adult medical examination without abnormal findings: Secondary | ICD-10-CM

## 2022-07-13 NOTE — Patient Instructions (Signed)
[name] , Thank you for taking time to come for your Medicare Wellness Visit. I appreciate your ongoing commitment to your health goals. Please review the following plan we discussed and let me know if I can assist you in the future.   These are the goals we discussed:  Goals      Patient Stated     Lose weight      Weight (lb) < 150 lb (68 kg)     Lose weight by increasing exercise.  Ride stationary bike Portion control meals GOLO diet         This is a list of the screening recommended for you and due dates:  Health Maintenance  Topic Date Due   Colon Cancer Screening  08/26/2022   COVID-19 Vaccine (3 - Moderna risk series) 07/29/2022*   Complete foot exam   08/10/2022   Flu Shot  08/24/2022   Mammogram  09/03/2022   Hemoglobin A1C  09/06/2022   Eye exam for diabetics  12/23/2022   Yearly kidney health urinalysis for diabetes  03/16/2023   Yearly kidney function blood test for diabetes  06/13/2023   DEXA scan (bone density measurement)  07/02/2023   Medicare Annual Wellness Visit  07/13/2023   DTaP/Tdap/Td vaccine (3 - Td or Tdap) 03/15/2032   Pneumonia Vaccine  Completed   Hepatitis C Screening  Completed   Zoster (Shingles) Vaccine  Completed   HPV Vaccine  Aged Out  *Topic was postponed. The date shown is not the original due date.    Advanced directives: Advance directive discussed with you today. Even though you declined this today, please call our office should you change your mind, and we can give you the proper paperwork for you to fill out. Advance care planning is a way to make decisions about medical care that fits your values in case you are ever unable to make these decisions for yourself.  Information on Advanced Care Planning can be found at [facility] [place] Advance Health Care Directives Advance Health Care Directives ([email or web address]    Conditions/risks identified: You will be do for a repeat colonoscopy in August.   Next appointment:  VIRTUAL/TELEPHONE APPOINTMENT Follow up in one year for your annual wellness visit  July 16, 2023 at 10:45am   Preventive Care 65 Years and Older, Female Preventive care refers to lifestyle choices and visits with your health care provider that can promote health and wellness. What does preventive care include? A yearly physical exam. This is also called an annual well check. Dental exams once or twice a year. Routine eye exams. Ask your health care provider how often you should have your eyes checked. Personal lifestyle choices, including: Daily care of your teeth and gums. Regular physical activity. Eating a healthy diet. Avoiding tobacco and drug use. Limiting alcohol use. Practicing safe sex. Taking low-dose aspirin every day. Taking vitamin and mineral supplements as recommended by your health care provider. What happens during an annual well check? The services and screenings done by your health care provider during your annual well check will depend on your age, overall health, lifestyle risk factors, and family history of disease. Counseling  Your health care provider may ask you questions about your: Alcohol use. Tobacco use. Drug use. Emotional well-being. Home and relationship well-being. Sexual activity. Eating habits. History of falls. Memory and ability to understand (cognition). Work and work Astronomer. Reproductive health. Screening  You may have the following tests or measurements: Height, weight, and BMI. Blood  pressure. Lipid and cholesterol levels. These may be checked every 5 years, or more frequently if you are over 75 years old. Skin check. Lung cancer screening. You may have this screening every year starting at age 24 if you have a 30-pack-year history of smoking and currently smoke or have quit within the past 15 years. Fecal occult blood test (FOBT) of the stool. You may have this test every year starting at age 22. Flexible sigmoidoscopy or  colonoscopy. You may have a sigmoidoscopy every 5 years or a colonoscopy every 10 years starting at age 31. Hepatitis C blood test. Hepatitis B blood test. Sexually transmitted disease (STD) testing. Diabetes screening. This is done by checking your blood sugar (glucose) after you have not eaten for a while (fasting). You may have this done every 1-3 years. Bone density scan. This is done to screen for osteoporosis. You may have this done starting at age 14. Mammogram. This may be done every 1-2 years. Talk to your health care provider about how often you should have regular mammograms. Talk with your health care provider about your test results, treatment options, and if necessary, the need for more tests. Vaccines  Your health care provider may recommend certain vaccines, such as: Influenza vaccine. This is recommended every year. Tetanus, diphtheria, and acellular pertussis (Tdap, Td) vaccine. You may need a Td booster every 10 years. Zoster vaccine. You may need this after age 71. Pneumococcal 13-valent conjugate (PCV13) vaccine. One dose is recommended after age 72. Pneumococcal polysaccharide (PPSV23) vaccine. One dose is recommended after age 71. Talk to your health care provider about which screenings and vaccines you need and how often you need them. This information is not intended to replace advice given to you by your health care provider. Make sure you discuss any questions you have with your health care provider. Document Released: 02/05/2015 Document Revised: 09/29/2015 Document Reviewed: 11/10/2014 Elsevier Interactive Patient Education  2017 [employer].  Fall Prevention in the Home Falls can cause injuries. They can happen to people of all ages. There are many things you can do to make your home safe and to help prevent falls. What can I do on the outside of my home? Regularly fix the edges of walkways and driveways and fix any cracks. Remove anything that might make you  trip as you walk through a door, such as a raised step or threshold. Trim any bushes or trees on the path to your home. Use bright outdoor lighting. Clear any walking paths of anything that might make someone trip, such as rocks or tools. Regularly check to see if handrails are loose or broken. Make sure that both sides of any steps have handrails. Any raised decks and porches should have guardrails on the edges. Have any leaves, snow, or ice cleared regularly. Use sand or salt on walking paths during winter. Clean up any spills in your garage right away. This includes oil or grease spills. What can I do in the bathroom? Use night lights. Install grab bars by the toilet and in the tub and shower. Do not use towel bars as grab bars. Use non-skid mats or decals in the tub or shower. If you need to sit down in the shower, use a plastic, non-slip stool. Keep the floor dry. Clean up any water that spills on the floor as soon as it happens. Remove soap buildup in the tub or shower regularly. Attach bath mats securely with double-sided non-slip rug tape. Do not have throw  rugs and other things on the floor that can make you trip. What can I do in the bedroom? Use night lights. Make sure that you have a light by your bed that is easy to reach. Do not use any sheets or blankets that are too big for your bed. They should not hang down onto the floor. Have a firm chair that has side arms. You can use this for support while you get dressed. Do not have throw rugs and other things on the floor that can make you trip. What can I do in the kitchen? Clean up any spills right away. Avoid walking on wet floors. Keep items that you use a lot in easy-to-reach places. If you need to reach something above you, use a strong step stool that has a grab bar. Keep electrical cords out of the way. Do not use floor polish or wax that makes floors slippery. If you must use wax, use non-skid floor wax. Do not have  throw rugs and other things on the floor that can make you trip. What can I do with my stairs? Do not leave any items on the stairs. Make sure that there are handrails on both sides of the stairs and use them. Fix handrails that are broken or loose. Make sure that handrails are as long as the stairways. Check any carpeting to make sure that it is firmly attached to the stairs. Fix any carpet that is loose or worn. Avoid having throw rugs at the top or bottom of the stairs. If you do have throw rugs, attach them to the floor with carpet tape. Make sure that you have a light switch at the top of the stairs and the bottom of the stairs. If you do not have them, ask someone to add them for you. What else can I do to help prevent falls? Wear shoes that: Do not have high heels. Have rubber bottoms. Are comfortable and fit you well. Are closed at the toe. Do not wear sandals. If you use a stepladder: Make sure that it is fully opened. Do not climb a closed stepladder. Make sure that both sides of the stepladder are locked into place. Ask someone to hold it for you, if possible. Clearly mark and make sure that you can see: Any grab bars or handrails. First and last steps. Where the edge of each step is. Use tools that help you move around (mobility aids) if they are needed. These include: Canes. Walkers. Scooters. Crutches. Turn on the lights when you go into a dark area. Replace any light bulbs as soon as they burn out. Set up your furniture so you have a clear path. Avoid moving your furniture around. If any of your floors are uneven, fix them. If there are any pets around you, be aware of where they are. Review your medicines with your doctor. Some medicines can make you feel dizzy. This can increase your chance of falling. Ask your doctor what other things that you can do to help prevent falls. This information is not intended to replace advice given to you by your health care provider.  Make sure you discuss any questions you have with your health care provider. Document Released: 11/05/2008 Document Revised: 06/17/2015 Document Reviewed: 02/13/2014 Elsevier Interactive Patient Education  2017 [employer].

## 2022-07-13 NOTE — Progress Notes (Signed)
 Subjective:   Michelle Sherman is a 72 y.o. female who presents for Medicare Annual (Subsequent) preventive examination.  Visit Complete: Virtual  I connected with  Zianne B Liendo on 07/13/22 by a audio enabled telemedicine application and verified that I am speaking with the correct person using two identifiers.  Patient Location: Home  Provider Location: Home Office  I discussed the limitations of evaluation and management by telemedicine. The patient expressed understanding and agreed to proceed.  Patient Medicare AWV questionnaire was completed by the patient on n/a; I have confirmed that all information answered by patient is correct and no changes since this date.  Review of Systems     Cardiac Risk Factors include: advanced age (>73men, >30 women);diabetes mellitus;dyslipidemia;hypertension;sedentary lifestyle;obesity (BMI >30kg/m2)     Objective:    Today's Vitals   07/13/22 1114 07/13/22 1115  BP: 127/80   Weight: 183 lb (83 kg)   Height: 5' (1.524 m)   PainSc:  2    Body mass index is 35.74 kg/m.     07/13/2022   11:21 AM 12/27/2021   10:25 AM 10/13/2021   12:05 PM 07/11/2021   11:45 AM 05/20/2020   11:18 AM 04/05/2020    1:20 PM 01/29/2020    1:56 PM  Advanced Directives  Does Patient Have a Medical Advance Directive? No No No No No No No  Would patient like information on creating a medical advance directive? No - Patient declined No - Patient declined No - Patient declined No - Patient declined No - Patient declined Yes (MAU/Ambulatory/Procedural Areas - Information given)     Current Medications (verified) Outpatient Encounter Medications as of 07/13/2022  Medication Sig   aspirin EC 81 MG tablet Take 1 tablet by mouth daily.   Biotin 2500 MCG CAPS Take 1 capsule by mouth daily.   Cholecalciferol (VITAMIN D3) 2000 units capsule Take 1 capsule by mouth daily.   empagliflozin (JARDIANCE) 25 MG TABS tablet Take 1 tablet (25 mg total) by mouth daily before  breakfast.   Evolocumab (REPATHA SURECLICK) 140 MG/ML SOAJ Inject 140 mg into the skin every 14 (fourteen) days.   exemestane (AROMASIN) 25 MG tablet Take 1 tablet (25 mg total) by mouth daily after breakfast.   ezetimibe (ZETIA) 10 MG tablet Take 1 tablet (10 mg total) by mouth daily.   famotidine (PEPCID) 40 MG tablet Take 40 mg by mouth 2 (two) times daily.   fexofenadine (ALLEGRA) 180 MG tablet Take 180 mg by mouth daily.   fluticasone (FLONASE) 50 MCG/ACT nasal spray Place into the nose.   icosapent Ethyl (VASCEPA) 1 g capsule Take 1 capsule (1 g total) by mouth 2 (two) times daily.   levothyroxine (SYNTHROID) 112 MCG tablet Take 1 tablet (112 mcg total) by mouth daily.   mesalamine (LIALDA) 1.2 g EC tablet Take by mouth. Take 2 tablet in am and 2 tablet in pm   metFORMIN (GLUCOPHAGE) 1000 MG tablet Take 1 tablet (1,000 mg total) by mouth 2 (two) times daily with a meal.   Misc Natural Products (TURMERIC CURCUMIN) CAPS Take 1 capsule by mouth daily.   Multiple Vitamin (MULTIVITAMIN) tablet Take 1 tablet by mouth daily.   ramipril (ALTACE) 10 MG capsule Take 1 capsule (10 mg total) by mouth daily.   traZODone (DESYREL) 50 MG tablet Take 0.5-1 tablets (25-50 mg total) by mouth at bedtime as needed for sleep.   No facility-administered encounter medications on file as of 07/13/2022.    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 lfts in 100s, resolved with cessation of lipitor. Normal liver ultrasound   Elevated CK 08/15/2021   Family history of adverse reaction to anesthesia    sister, hard to wake up    Family history of lung cancer    Family history of pancreatic cancer    Family history of pancreatic cancer    Genetic testing 10/06/2019   Negative genetic testing:  No pathogenic variants detected on the [facility] Breast Cancer STAT Panel + Common Hereditary Cancers Panel. 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 The report date is 10/05/2019.     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 I discussed option of draining seroma and ways to do this as well as risks.  I discussed that draining would likely make her breast less heavy feeling.  I reviewed that drainage would likely make the right breast smaller and the nipple point inferiorly.  I discussed that the seroma could recur and we would be in    Severe obesity (BMI 35.0-35.9 with comorbidity) (HCC) 11/01/2016   Sleep apnea    wears CPAP nightly   Thyroid disease    Ulcerative colitis [facility])    Past Surgical History:  Procedure Laterality  Date   BREAST LUMPECTOMY Right 10/2019   BREAST LUMPECTOMY WITH RADIOACTIVE SEED AND SENTINEL LYMPH NODE BIOPSY Right 10/14/2019   Procedure: RIGHT BREAST LUMPECTOMY WITH RADIOACTIVE SEED AND SENTINEL LYMPH NODE BIOPSY;  Surgeon: Almond Lint, MD;  Location: [facility];  Service: General;  Laterality: Right;   CHOLECYSTECTOMY     RE-EXCISION OF BREAST LUMPECTOMY Right 11/11/2019   Procedure: RIGHT RE-EXCISION OF BREAST LUMPECTOMY;  Surgeon: Almond Lint, MD;  Location: [facility];  Service: General;  Laterality: Right;  RNFA   SKIN SURGERY  12/2015   Fatty tumor removed   TONSILLECTOMY AND ADENOIDECTOMY  1956   Family History  Problem Relation Age of Onset   Ulcerative colitis Mother    Diabetes Mother    Heart disease Father        mid 17s   Lung cancer Father  59       Lung   Varicose Veins Sister    Other Sister        "stiff heart"   Diabetes Brother    Pancreatic cancer Brother 30       Pancreatic   Crohn's disease Brother    Ulcerative colitis Brother    Diabetes Brother    Cancer Brother        unsure of type   Heart attack Paternal Grandfather    Diabetes Maternal Aunt    Diabetes Maternal Uncle    Lung cancer Paternal Aunt        smoker   Cancer Paternal Aunt        unknown type   Social History   Socioeconomic History   Marital status: Married    Spouse name: Not on file   Number of children: 1   Years of education: Not on file   Highest education level: Not on file  Occupational History   Occupation: Retired     Comment: Retail buyer   Tobacco Use   Smoking status: Never   Smokeless tobacco: Never  Building services engineer Use: Never used  Substance and Sexual Activity   Alcohol use: Yes    Comment: occa   Drug use: No   Sexual activity: Yes    Birth control/protection: Post-menopausal  Other Topics Concern   Not on file  Social History Narrative   Married, cares for disabled husband,  no tob, Etoh or drug use; no  exercise   Social Determinants of Health   Financial Resource Strain: Low Risk  (07/13/2022)   Overall Financial Resource Strain (CARDIA)    Difficulty of Paying Living Expenses: Not hard at all  Food Insecurity: No Food Insecurity (07/13/2022)   Hunger Vital Sign    Worried About Running Out of Food in the Last Year: Never true    Ran Out of Food in the Last Year: Never true  Transportation Needs: No Transportation Needs (07/13/2022)   PRAPARE - Administrator, Civil Service (Medical): No    Lack of Transportation (Non-Medical): No  Physical Activity: Inactive (07/13/2022)   Exercise Vital Sign    Days of Exercise per Week: 0 days    Minutes of Exercise per Session: 0 min  Stress: No Stress Concern Present (07/13/2022)   [employer] of Occupational Health - Occupational Stress Questionnaire    Feeling of Stress : Not at all  Social Connections: Socially Integrated (07/13/2022)   Social Connection and Isolation Panel [NHANES]    Frequency of Communication with Friends and Family: More than three times a week    Frequency of Social Gatherings with Friends and Family: More than three times a week    Attends Religious Services: More than 4 times per year    Active Member of [employer] or Organizations: Yes    Attends Engineer, structural: More than 4 times per year    Marital Status: Married    Tobacco Counseling Counseling given: Yes   Clinical Intake:  Pre-visit preparation completed: Yes  Pain : 0-10 Pain Score: 2  Pain Type: Chronic pain Pain Location: Knee Pain Orientation: Right Pain Descriptors / Indicators:  (popping when she walks) Pain Onset: More than a month ago Pain Frequency: Intermittent     BMI - recorded: 35.74 Nutritional Status: BMI > 30  Obese Nutritional Risks: None Diabetes: Yes CBG done?: No Did pt. bring in CBG monitor from home?: No  How  often do you need to have someone help you when you read instructions, pamphlets, or  other written materials from your doctor or pharmacy?: 1 - Never  Interpreter Needed?: No  Information entered by ::  Alynah Schone, CMA   Activities of Daily Living    07/13/2022   11:21 AM  In your present state of health, do you have any difficulty performing the following activities:  Hearing? 0  Vision? 0  Difficulty concentrating or making decisions? 0  Walking or climbing stairs? 0  Dressing or bathing? 0  Doing errands, shopping? 0  Preparing Food and eating ? N  Using the Toilet? N  In the past six months, have you accidently leaked urine? N  Do you have problems with loss of bowel control? N  Managing your Medications? N  Managing your Finances? N  Housekeeping or managing your Housekeeping? N    Patient Care Team: Dana Allan, MD as PCP - General (Family Medicine) Waymon Budge, MD as Consulting Physician (Pulmonary Disease) Charna Elizabeth, MD as Consulting Physician (Gastroenterology) Sedalia Muta, PT as Physical Therapist (Physical Therapy) Vivi Barrack, DPM as Consulting Physician (Podiatry) Antony Contras, MD as Consulting Physician (Ophthalmology) Donnelly Angelica, RN as Oncology Nurse Navigator Pershing Proud, RN as Oncology Nurse Navigator Almond Lint, MD as Consulting Physician (General Surgery) Malachy Mood, MD as Consulting Physician (Hematology) Dorothy Puffer, MD as Consulting Physician (Radiation Oncology) Pollyann Samples, NP as Nurse Practitioner (Nurse Practitioner) Dahlia Byes, [facility] (Inactive) as Pharmacist (Pharmacist) Catalina Antigua, MD as Consulting Physician (Obstetrics and Gynecology)  Indicate any recent Medical Services you may have received from other than [facility] providers in the past year (date may be approximate).     Assessment:   This is a routine wellness examination for Jaquelinne.  Hearing/Vision screen Hearing Screening - Comments:: Patient denies any hearing difficulties.   Vision Screening - Comments:: Wears rx glasses -  up to date with routine eye exams    Dietary issues and exercise activities discussed:     Goals Addressed             This Visit's Progress    Patient Stated       Lose weight        Depression Screen    07/13/2022   11:18 AM 07/06/2022   10:13 AM 03/15/2022   10:08 AM 12/13/2021    1:26 PM 08/09/2021   11:18 AM 07/11/2021   11:28 AM 01/27/2021   11:49 AM  PHQ 2/9 Scores  PHQ - 2 Score 0 0 0 0 0 0 0  PHQ- 9 Score 0 2         Fall Risk    07/13/2022   11:21 AM 03/15/2022   10:08 AM 12/13/2021    1:26 PM 08/09/2021   11:18 AM 07/11/2021   11:30 AM  Fall Risk   Falls in the past year? 0 0 0 1 0  Number falls in past yr: 0 0 0 1   Injury with Fall? 0 0 0 0   Risk for fall due to : No Fall Risks No Fall Risks No Fall Risks    Follow up Falls prevention discussed Falls evaluation completed Falls evaluation completed  Falls evaluation completed    MEDICARE RISK AT HOME:  Medicare Risk at Home - 07/13/22 1117     Any stairs in or around the home? No    If so, are there any without handrails? No    Home free  of loose throw rugs in walkways, pet beds, electrical cords, etc? Yes    Adequate lighting in your home to reduce risk of falls? Yes    Life alert? No    Use of a cane, walker or w/c? No    Grab bars in the bathroom? No    Shower chair or bench in shower? No    Elevated toilet seat or a handicapped toilet? No             TIMED UP AND GO:  Was the test performed?  No    Cognitive Function:    03/27/2018   11:28 AM  MMSE - Mini Mental State Exam  Orientation to time 5  Orientation to Place 5  Registration 3  Attention/ Calculation 5  Recall 3  Language- name 2 objects 2  Language- repeat 1  Language- follow 3 step command 3  Language- read & follow direction 1  Write a sentence 1  Copy design 1  Total score 30        07/13/2022   11:22 AM 04/05/2020    1:26 PM 04/03/2019   11:28 AM  6CIT Screen  What Year? 0 points 0 points 0 points  What  month? 0 points 0 points 0 points  What time? 0 points  0 points  Count back from 20 0 points 0 points 0 points  Months in reverse 0 points 0 points 0 points  Repeat phrase 0 points 0 points 0 points  Total Score 0 points  0 points    Immunizations Immunization History  Administered Date(s) Administered   Fluad Quad(high Dose 65+) 12/06/2015, 12/15/2019, 10/27/2020, 12/13/2021   Influenza, High Dose Seasonal PF 12/06/2015, 11/01/2016, 09/21/2017, 10/26/2018   Influenza-Unspecified 10/04/2010, 10/05/2011, 10/17/2012, 10/15/2013   Moderna SARS-COV2 Booster Vaccination 04/15/2020, 04/15/2020   Moderna Sars-Covid-2 Vaccination 04/24/2019, 05/21/2019   Pneumococcal Conjugate-13 10/22/2014   Pneumococcal Polysaccharide-23 10/05/2011, 12/21/2016   Tdap 10/04/2010, 03/15/2022   Zoster Recombinat (Shingrix) 01/02/2019, 03/25/2019   Zoster, Live 10/03/2009    TDAP status: Up to date  Flu Vaccine status: Up to date  Pneumococcal vaccine status: Up to date  Covid-19 vaccine status: Information provided on how to obtain vaccines.   Qualifies for Shingles Vaccine? Yes   Zostavax completed Yes   Shingrix Completed?: Yes  Screening Tests Health Maintenance  Topic Date Due   COVID-19 Vaccine (3 - Moderna risk series) 05/13/2020   Colonoscopy  08/26/2022   FOOT EXAM  08/10/2022   INFLUENZA VACCINE  08/24/2022   MAMMOGRAM  09/03/2022   HEMOGLOBIN A1C  09/06/2022   OPHTHALMOLOGY EXAM  12/23/2022   Diabetic kidney evaluation - Urine ACR  03/16/2023   Diabetic kidney evaluation - eGFR measurement  06/13/2023   DEXA SCAN  07/02/2023   Medicare Annual Wellness (AWV)  07/13/2023   DTaP/Tdap/Td (3 - Td or Tdap) 03/15/2032   Pneumonia Vaccine 40+ Years old  Completed   Hepatitis C Screening  Completed   Zoster Vaccines- Shingrix  Completed   HPV VACCINES  Aged Out    Health Maintenance  Health Maintenance Due  Topic Date Due   COVID-19 Vaccine (3 - Moderna risk series) 05/13/2020    Colonoscopy  08/26/2022    Colorectal cancer screening: Type of screening: Colonoscopy. Completed 08/25/2020. Repeat every 2 years  Mammogram status: Ordered scheduled for 09/04/2022. Pt provided with contact info and advised to call to schedule appt.   Bone Density status: Ordered scheduled for 10/03/2022. Pt provided with contact info and  advised to call to schedule appt.  Lung Cancer Screening: (Low Dose CT Chest recommended if Age 35-80 years, 20 pack-year currently smoking OR have quit w/in 15years.) does not qualify.   Additional Screening:  Hepatitis C Screening: does not qualify; Completed 10/27/2020  Vision Screening: Recommended annual ophthalmology exams for early detection of glaucoma and other disorders of the eye. Is the patient up to date with their annual eye exam?  Yes  Who is the provider or what is the name of the office in which the patient attends annual eye exams? [facility] Cont[facility] If pt is not established with a provider, would they like to be referred to a provider to establish care? No .   Dental Screening: Recommended annual dental exams for proper oral hygiene  Diabetic Foot Exam: Diabetic Foot Exam: Completed 08/09/2021  Community Resource Referral / Chronic Care Management: CRR required this visit?  No   CCM required this visit?  No     Plan:     I have personally reviewed and noted the following in the patient's chart:   Medical and social history Use of alcohol, tobacco or illicit drugs  Current medications and supplements including opioid prescriptions. Patient is not currently taking opioid prescriptions. Functional ability and status Nutritional status Physical activity Advanced directives List of other physicians Hospitalizations, surgeries, and ER visits in previous 12 months Vitals Screenings to include cognitive, depression, and falls Referrals and appointments  In addition, I have reviewed and discussed with patient certain preventive  protocols, quality metrics, and best practice recommendations. A written personalized care plan for preventive services as well as general preventive health recommendations were provided to patient.   Because this visit was a virtual/telehealth visit,  certain criteria was not obtained, such a blood pressure, CBG if patient is a diabetic, and timed up and go.    Jordan Hawks Hazell Siwik, CMA   07/13/2022   After Visit Summary: (MyChart) Due to this being a telephonic visit, the after visit summary with patients personalized plan was offered to patient via MyChart   Nurse Notes: declines covid

## 2022-07-25 ENCOUNTER — Other Ambulatory Visit: Payer: Self-pay | Admitting: Genetic Counselor

## 2022-07-25 ENCOUNTER — Encounter: Payer: Self-pay | Admitting: Genetic Counselor

## 2022-07-25 ENCOUNTER — Inpatient Hospital Stay: Attending: Nurse Practitioner | Admitting: Genetic Counselor

## 2022-07-25 ENCOUNTER — Inpatient Hospital Stay

## 2022-07-25 DIAGNOSIS — C50919 Malignant neoplasm of unspecified site of unspecified female breast: Secondary | ICD-10-CM

## 2022-07-25 DIAGNOSIS — Z853 Personal history of malignant neoplasm of breast: Secondary | ICD-10-CM

## 2022-07-25 DIAGNOSIS — Z85820 Personal history of malignant melanoma of skin: Secondary | ICD-10-CM

## 2022-07-25 DIAGNOSIS — Z1379 Encounter for other screening for genetic and chromosomal anomalies: Secondary | ICD-10-CM

## 2022-07-25 LAB — GENETIC SCREENING ORDER

## 2022-07-25 NOTE — Progress Notes (Unsigned)
REFERRING PROVIDER: Santiago Glad, NP  PRIMARY PROVIDER:  Dana Allan, MD  PRIMARY REASON FOR VISIT:  Encounter Diagnoses  Name Primary?   Personal history of breast cancer Yes   Personal history of malignant melanoma of skin    HISTORY OF PRESENT ILLNESS:   Michelle Sherman, a 72 y.o. female, was seen for a [facility] cancer genetics consultation at the request of Santiago Glad, NP due to a personal and family history of cancer.  Michelle Sherman presents to clinic today to discuss the possibility of a hereditary predisposition to cancer, to discuss genetic testing, and to further clarify her future cancer risks, as well as potential cancer risks for family members.   Michelle Sherman was diagnosed with breast cancer at age 9 and melanoma at age 66. She had hereditary cancer genetic testing through [facility] in 2021. The [facility] Common Cancer Panel (48 genes) was Negative. She is interested in updated testing.   CANCER HISTORY:  Oncology History Overview Note  Cancer Staging Malignant neoplasm of upper-outer quadrant of right breast in female, estrogen receptor positive (HCC) Staging form: Breast, AJCC 8th Edition - Clinical stage from 09/12/2019: Stage IA (cT1b, cN0, cM0, G1, ER+, PR+, HER2-) - Signed by Malachy Mood, MD on 09/23/2019    Malignant neoplasm of upper-outer quadrant of right breast in female, estrogen receptor positive (HCC) (Resolved)  09/05/2019 Mammogram   IMPRESSION: Indeterminate 9 x 6 x 6 mm mass involving the OUTER RIGHT breast at the 9 o'clock position approximately 5 cm from the nipple at POSTERIOR depth, located anterior to a normal appearing intramammary lymph Node.      09/12/2019 Cancer Staging   Staging form: Breast, AJCC 8th Edition - Clinical stage from 09/12/2019: Stage IA (cT1b, cN0, cM0, G1, ER+, PR+, HER2-) - Signed by Malachy Mood, MD on 09/23/2019   09/12/2019 Initial Biopsy   Diagnosis 1. Breast, right, needle core biopsy, 9 o'clock posterior - INVASIVE DUCTAL  CARCINOMA WITH PAPILLARY FEATURES. 2. Breast, right, needle core biopsy, 9 o'clock anterior - INVASIVE DUCTAL CARCINOMA WITH PAPILLARY FEATURES. Microscopic Comment 1. - 2. The specimens share similar morphologic features. E-cadherin is positive and CK5/6 is negative. P63, Calponin and SMM-1 demonstrate the absence of myoepithelium. Ancillary studies will be reported separately. Results reported to [facility] on 09/15/2019. Intradepartmental consultation (Dr. Berneice Heinrich).   09/12/2019 Receptors her2   1. PROGNOSTIC INDICATORS Results: IMMUNOHISTOCHEMICAL AND MORPHOMETRIC ANALYSIS PERFORMED MANUALLY The tumor cells are NEGATIVE for Her2 (1+). Estrogen Receptor: 99%, POSITIVE, STRONG STAINING INTENSITY Progesterone Receptor: 99%, POSITIVE, STRONG STAINING INTENSITY Proliferation Marker Ki67: 5%   09/18/2019 Initial Diagnosis   Malignant neoplasm of upper-outer quadrant of right breast in female, estrogen receptor positive (HCC)   10/05/2019 Genetic Testing   Negative genetic testing:  No pathogenic variants detected on the [facility] Breast Cancer STAT Panel + Common Hereditary Cancers Panel. 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 The report date is 10/05/2019.  The Breast Cancer STAT Panel offered by [facility] includes sequencing and deletion/duplication analysis for the following 9 genes:  ATM, BRCA1, BRCA2, CDH1, CHEK2, PALB2, PTEN, STK11 and TP53. The Common Hereditary Cancers Panel offered by [facility] includes sequencing and/or deletion duplication testing of the following 48 genes: APC, ATM, AXIN2, BARD1, BMPR1A, BRCA1, BRCA2, BRIP1, CDH1, CDK4, CDKN2A (p14ARF), CDKN2A (p16INK4a), CHEK2, CTNNA1, DICER1, EPCAM (Deletion/duplication testing only), GREM1 (promoter region deletion/duplication testing only), KIT, MEN1, MLH1, MSH2, MSH3, MSH6, MUTYH, NBN, NF1, NTHL1, PALB2, PDGFRA, PMS2, POLD1,  POLE, PTEN, RAD50, RAD51C, RAD51D, RNF43,  SDHB, SDHC, SDHD, SMAD4, SMARCA4. STK11, TP53, TSC1, TSC2, and VHL.  The following genes were evaluated for sequence changes only: SDHA and HOXB13 c.251G>A variant only.    10/14/2019 Pathology Results   RIGHT BREAST LUMPECTOMY WITH RADIOACTIVE SEED AND SENTINEL LYMPH NODE BIOPSY by Dr Donell Beers    FINAL MICROSCOPIC DIAGNOSIS:   A. BREAST, RIGHT, LUMPECTOMY:  - Multifocal invasive ductal carcinoma with papillary features, grade 2,  spanning 0.9 cm and 0.5 cm.  - Intermediate grade ductal carcinoma in situ.  - Invasive carcinoma present at original inferior margin broadly, final  inferior margin (Part F) is negative.  - Margins are negative for in situ carcinoma.  - Biopsy site.  - See oncology table.   B. BREAST, RIGHT ADDITIONAL POSTERIOR MARGIN, EXCISION:  - Benign breast tissue.   C. BREAST, RIGHT ADDITIONAL LATERAL MARGIN, EXCISION:  - Benign breast tissue.   D. BREAST, RIGHT ADDITIONAL SUPERIOR MARGIN, EXCISION:  - Benign breast tissue.   E. BREAST, RIGHT ADDITIONAL MEDIAL MARGIN, EXCISION:  - Invasive ductal carcinoma with papillary features, grade 2, spanning  0.5 cm.  - Invasive carcinoma is present at the new medial margin broadly.   F. BREAST, RIGHT ADDITIONAL INFERIOR MARGIN, EXCISION:  - Benign breast tissue.   G. LYMPH NODE, RIGHT AXILLARY #1, SENTINEL, EXCISION:  - One of one lymph nodes negative for carcinoma (0/1).   H. LYMPH NODE, RIGHT AXILLARY #2, SENTINEL, EXCISION:  - One of one lymph nodes negative for carcinoma (0/1).   10/14/2019 Oncotype testing   Oncotype  Recurrence Score 0 with distant recurrence risk at 9 years of 3%.  There is less then 1% benefit of chemotherapy   10/14/2019 Cancer Staging   Staging form: Breast, AJCC 8th Edition - Pathologic stage from 10/14/2019: Stage IA (pT1b, pN0, cM0, G2, ER+, PR+, HER2-, Oncotype DX score: 0) - Signed by Malachy Mood, MD on 01/29/2020   11/11/2019 Pathology Results   RIGHT RE-EXCISION OF BREAST LUMPECTOMY  by Dr Donell Beers    FINAL MICROSCOPIC DIAGNOSIS:   A. BREAST, RIGHT, RE-EXCISION OF MEDIAL MARGIN:  - Prior procedure site changes.  - No carcinoma identified.   COMMENT:   P63, Calponin and SMM-1 demonstrate the presence of myoepithelium in the  select focus.    12/22/2019 - 01/19/2020 Radiation Therapy   Adjuvant Radiation with Dr Mitzi Hansen    05/06/2020 Survivorship   SCP delivered by Santiago Glad, NP      Past Medical History:  Diagnosis Date   Acquired trigger finger of left middle finger 04/11/2021   Acquired trigger finger of right index finger 04/11/2021   Acquired trigger finger of right middle finger 04/11/2021   Allergy    ANA positive 08/16/2021   Arthritis    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 lfts in 100s, resolved with cessation of lipitor. Normal liver ultrasound   Elevated CK 08/15/2021   Family history of adverse reaction to anesthesia    sister, hard to wake up    Family history of lung cancer    Family history of pancreatic cancer  Family history of pancreatic cancer    Genetic testing 10/06/2019   Negative genetic testing:  No pathogenic variants detected on the [facility] Breast Cancer STAT Panel + Common Hereditary Cancers Panel. 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 The report date is 10/05/2019.     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 I discussed option of draining seroma and ways to do this as well as risks.  I discussed that draining would likely make her breast less heavy feeling.  I reviewed that drainage would likely make the right breast smaller and the nipple point inferiorly.  I discussed that the seroma could recur and we would be in    Severe obesity (BMI 35.0-35.9 with comorbidity) (HCC) 11/01/2016   Sleep apnea    wears CPAP nightly   Thyroid disease    Ulcerative colitis [facility])     Past Surgical History:  Procedure Laterality Date   BREAST LUMPECTOMY Right 10/2019   BREAST LUMPECTOMY WITH RADIOACTIVE SEED AND SENTINEL LYMPH NODE BIOPSY Right 10/14/2019   Procedure: RIGHT BREAST LUMPECTOMY WITH RADIOACTIVE SEED AND SENTINEL LYMPH NODE BIOPSY;  Surgeon: Almond Lint, MD;  Location: [facility];  Service: General;  Laterality: Right;   CHOLECYSTECTOMY     RE-EXCISION OF BREAST LUMPECTOMY Right 11/11/2019   Procedure: RIGHT RE-EXCISION OF BREAST LUMPECTOMY;  Surgeon: Almond Lint, MD;  Location: [facility];  Service: General;  Laterality: Right;  RNFA   SKIN SURGERY  12/2015   Fatty tumor removed   TONSILLECTOMY AND ADENOIDECTOMY  1956    Social History   Socioeconomic History   Marital status: Married    Spouse name: Not on file   Number of children: 1   Years of education: Not on file   Highest education level: Not on file  Occupational History   Occupation: Retired     Comment: Retail buyer   Tobacco Use   Smoking status: Never   Smokeless tobacco: Never  Vaping  Use   Vaping Use: Never used  Substance and Sexual Activity   Alcohol use: Yes    Comment: occa   Drug use: No   Sexual activity: Yes    Birth control/protection: Post-menopausal  Other Topics Concern   Not on file  Social History Narrative   Married, cares for disabled husband,  no tob, Etoh or drug use; no exercise   Social Determinants of Health   Financial Resource Strain: Low Risk  (07/13/2022)   Overall Financial Resource Strain (CARDIA)    Difficulty of Paying Living Expenses: Not hard at all  Food Insecurity: No Food Insecurity (07/13/2022)   Hunger Vital Sign    Worried About Running Out of Food in the Last Year: Never true    Ran Out of Food in the Last Year: Never true  Transportation Needs: No Transportation Needs (07/13/2022)  PRAPARE - Administrator, Civil Service (Medical): No    Lack of Transportation (Non-Medical): No  Physical Activity: Inactive (07/13/2022)   Exercise Vital Sign    Days of Exercise per Week: 0 days    Minutes of Exercise per Session: 0 min  Stress: No Stress Concern Present (07/13/2022)   [employer] of Occupational Health - Occupational Stress Questionnaire    Feeling of Stress : Not at all  Social Connections: Socially Integrated (07/13/2022)   Social Connection and Isolation Panel [NHANES]    Frequency of Communication with Friends and Family: More Sherman three times a week    Frequency of Social Gatherings with Friends and Family: More Sherman three times a week    Attends Religious Services: More Sherman 4 times per year    Active Member of [employer] or Organizations: Yes    Attends Engineer, structural: More Sherman 4 times per year    Marital Status: Married     FAMILY HISTORY:  We obtained a detailed, 4-generation family history.  Significant diagnoses are listed below: Family History  Problem Relation Age of Onset   Lung cancer Father 49       Lung   Pancreatic cancer Maternal Half-Brother 34   Colon cancer Brother  25 - 75   Lung cancer Paternal Aunt        smoker   Cancer Paternal Aunt        unknown type       Michelle Sherman is unaware of previous family history of genetic testing for hereditary cancer risks. There is no reported Ashkenazi Jewish ancestry.   GENETIC COUNSELING ASSESSMENT: Michelle Sherman is a 72 y.o. female with a personal and family history of cancer which is somewhat suggestive of a hereditary predisposition to cancer. We, therefore, discussed and recommended the following at today's visit.   DISCUSSION: We discussed that 5 - 10% of cancer is hereditary, with most cases of breast cancer associated with BRCA1/2.  There are other genes that can be associated with hereditary breast cancer and melanoma syndromes. We discussed that testing is beneficial for several reasons, including knowing about other cancer risks, identifying potential screening and risk-reduction options that may be appropriate, and to understanding if other family members could be at risk for cancer and allowing them to undergo genetic testing.  We reviewed the characteristics, features and inheritance patterns of hereditary cancer syndromes. We also discussed genetic testing, including the appropriate family members to test, the process of testing, insurance coverage and turn-around-time for results. We reviewed the implications of a negative, positive, carrier and/or variant of uncertain significant result.   We discussed the option of updated testing to include RNA analysis and melanoma genes that were not included on her prior genetic test. We discussed that the testing yield is very low, but it is an option if she is interested. She is very motivated to be proactive with her health and provide information to her son. Therefore, she opted to pursue updated testing.   Michelle Sherman was offered a common hereditary cancer panel (48 genes) and an expanded pan-cancer panel (71 genes). Michelle Sherman was informed of the benefits and  limitations of each panel, including that expanded pan-cancer panels contain genes that do not have clear management guidelines at this point in time.  We also discussed that as the number of genes included on a panel increases, the chances of variants of uncertain significance increases. After considering the benefits and limitations of  each gene panel, Michelle Sherman elected to have [facility] CancerNext-Expanded Panel.  The CancerNext-Expanded gene panel offered by [facility] and includes sequencing, rearrangement, and RNA analysis for the following 71 genes: AIP, ALK, APC, ATM, AXIN2, BAP1, BARD1, BMPR1A, BRCA1, BRCA2, BRIP1, CDC73, CDH1, CDK4, CDKN1B, CDKN2A, CHEK2, CTNNA1, DICER1, FH, FLCN, KIF1B, LZTR1, MAX, MEN1, MET, MLH1, MSH2, MSH3, MSH6, MUTYH, NF1, NF2, NTHL1, PALB2, PHOX2B, PMS2, POT1, PRKAR1A, PTCH1, PTEN, RAD51C, RAD51D, RB1, RET, SDHA, SDHAF2, SDHB, SDHC, SDHD, SMAD4, SMARCA4, SMARCB1, SMARCE1, STK11, SUFU, TMEM127, TP53, TSC1, TSC2, and VHL (sequencing and deletion/duplication); EGFR, EGLN1, HOXB13, KIT, MITF, PDGFRA, POLD1, and POLE (sequencing only); EPCAM and GREM1 (deletion/duplication only).   Based on Michelle Sherman's personal and family history of cancer, she meets medical criteria for genetic testing. Despite that she meets criteria, she may still have an out of pocket cost because she had this testing done in 2021. We discussed that if her out of pocket cost for testing is over $100, the laboratory will call and confirm whether she wants to proceed with testing.  If the out of pocket cost of testing is less Sherman $100 she will be billed by the genetic testing laboratory.   PLAN: After considering the risks, benefits, and limitations, Michelle Sherman provided informed consent to pursue genetic testing and the blood sample was sent to [facility] for analysis of the CancerNext-Expanded Panel. Results should be available within approximately 2-3 weeks' time, at which point they will be  disclosed by telephone to Michelle Sherman, as will any additional recommendations warranted by these results. Michelle Sherman will receive a summary of her genetic counseling visit and a copy of her results once available. This information will also be available in Epic.   Michelle Sherman questions were answered to her satisfaction today. Our contact information was provided should additional questions or concerns arise. Thank you for the referral and allowing [place] to share in the care of your patient.   Lalla Brothers, MS, [facility] Genetic Counselor [email or web address] (P) [phone]  The patient was seen for a total of 30 minutes in face-to-face genetic counseling. The patient was seen alone.  Drs. Pamelia Hoit and/or Mosetta Putt were available to discuss this case as needed.   _______________________________________________________________________ For Office Staff:  Number of people involved in session: 1 Was an Intern/ student involved with case: no

## 2022-07-30 NOTE — Progress Notes (Unsigned)
HPI female never smoker followed for OSA complicated by hypothyroid, DM 2, hyperlipidemia, allergic rhinitis, HBP, ulcerative colitis, morbid obesity NPSG 06/04/06- AHI ? 23/ hr, desaturation to 66%, CPAP titrated to 8, body weight 242 lbs HST 02/05/17-AHI 23.4/hour, desaturation to 86%, body weight 192 pounds -----------------------------------------------------------------------------   08/18/21- 72 year old female never smoker followed for OSA complicated by hypothyroid, DM 2, hyperlipidemia, allergic rhinitis, HBP, Ulcerative Colitis, morbid obesity, Breast Cancer R,  CPAP auto 5-15/ [facility]   AirSense 10 AutoSet Download-compliance 93%, AHI 0.9/ hr Body weight today-196 lbs Covid vax-3 Moderna -----=Pt f/u sleep has been "fine", approx 7-8hs/night. CPAP is working well per pt. No other questions or concerns Dealing with a sore shoulder and husband had recent stroke. Allowing for this, sleeps well.  Download reviewed. Comfortable that CPAP helps. Breathing is ok.  07/31/22-  72 year old female never smoker followed for OSA complicated by hypothyroid, DM 2, hyperlipidemia, allergic rhinitis, HBP, Ulcerative Colitis, morbid obesity, Breast Cancer R,  CPAP auto 5-15/ [facility]   AirSense 10 AutoSet Download-compliance 100%, AHI 0.7/ hr Body weight today187 lbs He reports feeling well and with no problems related to her CPAP.  She does not think she can sleep without it any longer.  Estimate machine about 73 years old.  Download reviewed.  Marland KitchenROS-see HPI   + = positive Constitutional:    +weight loss, night sweats, fevers, chills, fatigue, lassitude. HEENT:    headaches, difficulty swallowing, tooth/dental problems, sore throat,       sneezing, itching, ear ache, nasal congestion, post nasal drip, snoring CV:    chest pain, orthopnea, PND, swelling in lower extremities, anasarca,                                   dizziness, palpitations Resp:   shortness of breath with exertion or at rest.                 productive cough,   non-productive cough, coughing up of blood.              change in color of mucus.  wheezing.   Skin:    rash or lesions. GI:  No-   heartburn, indigestion, abdominal pain, nausea, vomiting, diarrhea,                 change in bowel habits, loss of appetite GU: dysuria, change in color of urine, no urgency or frequency.   flank pain. MS:   joint pain, +stiffness, decreased range of motion, back pain. Neuro-     nothing unusual Psych:  change in mood or affect.  depression or anxiety.   memory loss.  OBJ- Physical Exam General- Alert, Oriented, Affect-appropriate, Distress- none acute, + obese Skin- rash-none, lesions- none, excoriation- none Lymphadenopathy- none Head- atraumatic            Eyes- Gross vision intact, PERRLA, conjunctivae and secretions clear            Ears- Hearing, canals-normal            Nose- Clear, no-Septal dev, mucus, polyps, erosion, perforation             Throat- Mallampati II-III , mucosa clear , drainage- none, tonsils- atrophic Neck- flexible , trachea midline, no stridor , thyroid nl, carotid no bruit Chest - symmetrical excursion , unlabored           Heart/CV- RRR , no murmur , no  gallop  , no rub, nl s1 s2                           - JVD- none , edema- none, stasis changes- none, varices- none           Lung-    clear, wheeze- none, cough- none , dullness-none, rub- none           Chest wall-  Abd-  Br/ Gen/ Rectal- Not done, not indicated Extrem- cyanosis- none, clubbing, none, atrophy- none, strength- nl, + superficial varices L calf Neuro- grossly intact to observation

## 2022-07-31 ENCOUNTER — Encounter: Payer: Self-pay | Admitting: Internal Medicine

## 2022-07-31 ENCOUNTER — Encounter: Payer: Self-pay | Admitting: Family Medicine

## 2022-07-31 ENCOUNTER — Ambulatory Visit (INDEPENDENT_AMBULATORY_CARE_PROVIDER_SITE_OTHER): Payer: Medicare PPO | Admitting: Internal Medicine

## 2022-07-31 VITALS — BP 134/82 | HR 79 | Temp 97.7°F | Ht 60.0 in | Wt 187.0 lb

## 2022-07-31 DIAGNOSIS — G4733 Obstructive sleep apnea (adult) (pediatric): Secondary | ICD-10-CM

## 2022-07-31 DIAGNOSIS — E669 Obesity, unspecified: Secondary | ICD-10-CM | POA: Diagnosis not present

## 2022-07-31 MED ORDER — EZETIMIBE 10 MG PO TABS: 10.0000 mg | ORAL_TABLET | Freq: Every day | ORAL | 3 refills | Status: DC

## 2022-07-31 MED ORDER — RAMIPRIL 10 MG PO CAPS: 10.0000 mg | ORAL_CAPSULE | Freq: Every day | ORAL | 3 refills | Status: DC

## 2022-07-31 NOTE — Patient Instructions (Signed)
We can continue CPAP auto 5-15  I think you are doing very well. Please call if we can help

## 2022-07-31 NOTE — Assessment & Plan Note (Signed)
Encourage attention to diet/exercise seeking normal body weight.

## 2022-07-31 NOTE — Assessment & Plan Note (Signed)
Benefits from CPAP with good compliance and control Plan- continue auto 5-15 

## 2022-08-16 ENCOUNTER — Encounter: Payer: Self-pay | Admitting: Genetic Counselor

## 2022-08-18 ENCOUNTER — Encounter: Payer: Self-pay | Admitting: Family Medicine

## 2022-08-22 NOTE — Progress Notes (Signed)
Cardiology Office Note:  .   Date:  08/23/2022  ID:  Michelle Sherman, DOB 1950-11-18, MRN [id number] PCP: Dana Allan, MD  [facility] Providers Cardiologist:  None {  History of Present Illness: Michelle Sherman is a 72 y.o. female with a past medical history of breast cancer status postlumpectomy, diabetes mellitus, sleep apnea on CPAP, thyroid disease and coronary calcification on CT here for evaluation of chest pain.  She was seen by Dr. Servando Salina 02/15/2022 and was having left-sided chest pressure like sensation.  Occurring off-and-on.  Occurs with exertion.  Nothing makes it better or worse.  Ultimately, decided on a coronary CTA.  Lifestyle modification advised.  Coronary CTA results showed mild CAD in the ostial and proximal LAD, 25 to 49% stenosis, coronary calcium score 113 which places the patient at 69th percentile for age and sex matched controls.  Normal coronary origins with right dominance.  Medication management was recommended.  Recheck lipid panel/LFTs-PCP  Today, she says she has not had any real chest pain but has tightness with stress.  She needs to get an injection in her hands and possibly right knee.  Has some arthritis.  No leg swelling but stiffening.  No shortness of breath.  Tries to do as much activity as she can.  We discussed lipid-lowering diet and she would like to recheck a lipid panel with her primary care in 2 weeks and then make any adjustments if needed to her medications to further lower triglycerides (when they were last checked they were 200).  LDL is extremely low due to Repatha.  Otherwise, doing okay from a cardiovascular standpoint.  Compliant with medications without issues.  Reports no shortness of breath nor dyspnea on exertion. Reports no chest pain, pressure, or tightness. No edema, orthopnea, PND. Reports no palpitations.   ROS:  pertinent ROS in HPI  Studies Reviewed: Marland Kitchen        Coronary CTA 02/22/2022  IMPRESSION: 1. Mild CAD in  the ostial and proximal LAD, 25-49% stenosis, CADRADS 2. 2. Coronary calcium score is 113, which places the patient in the 69th percentile for age and sex matched control. 3. Normal coronary origins with right dominance.   RECOMMENDATIONS: CAD-RADS 2. Mild non-obstructive CAD (25-49%). Consider non-atherosclerotic causes of chest pain. Consider preventive therapy and risk factor modification.   Electronically Signed: By: Weston Brass M.D. On: 02/22/2022 15:36       Physical Exam:   VS:  BP 120/62   Pulse 81   Ht 5' (1.524 m)   Wt 183 lb 9.6 oz (83.3 kg)   SpO2 96%   BMI 35.86 kg/m    Wt Readings from Last 3 Encounters:  08/23/22 183 lb 9.6 oz (83.3 kg)  07/31/22 187 lb (84.8 kg)  07/13/22 183 lb (83 kg)    GEN: Well nourished, well developed in no acute distress NECK: No JVD; No carotid bruits CARDIAC: RRR, no murmurs, rubs, gallops RESPIRATORY:  Clear to auscultation without rales, wheezing or rhonchi  ABDOMEN: Soft, non-tender, non-distended EXTREMITIES:  No edema; No deformity   ASSESSMENT AND PLAN: .   1.  Chest pain with recent coronary CTA -Morbid chest tightness that is emotional in nature, nonexertional -No significant CAD on recent coronary CTA, reviewed with patient today -Goal is to keep LDL below 55 and triglycerides under 150 -LDL is at goal but triglycerides were 200 -Discussed some dietary changes as far as lipid-lowering diet and plan to recheck in a few  weeks  2.  Hypertension -Blood pressure well-controlled today, 120/62 -Continue current medication regimen with ramipril 10 mg daily  3.  OSA on CPAP -Compliant with CPAP with no issues  4.  Obesity, BMI 35-39.9 -Discussed lipid-lowering, low-sodium diet Increase exercise as able-, goal is 20 to 30 minutes most days  5.Hyperlipidemia -Repatha 140 mg/mL every 14 days, Zetia 10 daily -Intolerant to statins -She is on Vascepa 2g BID     Dispo: Follow-up with Dr. Servando Salina in  6 months, sooner if  needed  Signed, Sharlene Dory, PA-C

## 2022-08-23 ENCOUNTER — Ambulatory Visit: Payer: Medicare Other | Admitting: Cardiology

## 2022-08-23 ENCOUNTER — Encounter: Payer: Self-pay | Admitting: Physician Assistant

## 2022-08-23 ENCOUNTER — Encounter (INDEPENDENT_AMBULATORY_CARE_PROVIDER_SITE_OTHER): Payer: Self-pay

## 2022-08-23 ENCOUNTER — Ambulatory Visit: Payer: Medicare PPO | Attending: Cardiology | Admitting: Physician Assistant

## 2022-08-23 VITALS — BP 120/62 | HR 81 | Ht 60.0 in | Wt 183.6 lb

## 2022-08-23 DIAGNOSIS — I10 Essential (primary) hypertension: Secondary | ICD-10-CM | POA: Diagnosis not present

## 2022-08-23 DIAGNOSIS — G720 Drug-induced myopathy: Secondary | ICD-10-CM

## 2022-08-23 DIAGNOSIS — I2510 Atherosclerotic heart disease of native coronary artery without angina pectoris: Secondary | ICD-10-CM | POA: Diagnosis not present

## 2022-08-23 DIAGNOSIS — E785 Hyperlipidemia, unspecified: Secondary | ICD-10-CM

## 2022-08-23 DIAGNOSIS — G4733 Obstructive sleep apnea (adult) (pediatric): Secondary | ICD-10-CM | POA: Diagnosis not present

## 2022-08-23 DIAGNOSIS — T466X5A Adverse effect of antihyperlipidemic and antiarteriosclerotic drugs, initial encounter: Secondary | ICD-10-CM | POA: Diagnosis not present

## 2022-08-23 DIAGNOSIS — E669 Obesity, unspecified: Secondary | ICD-10-CM

## 2022-08-23 NOTE — Patient Instructions (Addendum)
Medication Instructions:  Your physician recommends that you continue on your current medications as directed. Please refer to the Current Medication list given to you today.  *If you need a refill on your cardiac medications before your next appointment, please call your pharmacy*   Lab Work: None ordered  Make sure to get your lipid / lft's with your primary care physician  If you have labs (blood work) drawn today and your tests are completely normal, you will receive your results only by: MyChart Message (if you have MyChart) OR A paper copy in the mail If you have any lab test that is abnormal or we need to change your treatment, we will call you to review the results.   Testing/Procedures: None ordered   Follow-Up: At [facility], you and your health needs are our priority.  As part of our continuing mission to provide you with exceptional heart care, we have created designated Provider Care Teams.  These Care Teams include your primary Cardiologist (physician) and Advanced Practice Providers (APPs -  Physician Assistants and Nurse Practitioners) who all work together to provide you with the care you need, when you need it.  We recommend signing up for the patient portal called "MyChart".  Sign up information is provided on this After Visit Summary.  MyChart is used to connect with patients for Virtual Visits (Telemedicine).  Patients are able to view lab/test results, encounter notes, upcoming appointments, etc.  Non-urgent messages can be sent to your provider as well.   To learn more about what you can do with MyChart, go to [email or web address].    Your next appointment:   6 month(s)  Provider:   Thomasene Ripple, MD    Other Instructions Low-Sodium Eating Plan Salt (sodium) helps you keep a healthy balance of fluids in your body. Too much sodium can raise your blood pressure. It can also cause fluid and waste to be held in your body. Your health care provider or  dietitian may recommend a low-sodium eating plan if you have high blood pressure (hypertension), kidney disease, liver disease, or heart failure. Eating less sodium can help lower your blood pressure and reduce swelling. It can also protect your heart, liver, and kidneys. What are tips for following this plan? Reading food labels  Check food labels for the amount of sodium per serving. If you eat more than one serving, you must multiply the listed amount by the number of servings. Choose foods with less than 140 milligrams (mg) of sodium per serving. Avoid foods with 300 mg of sodium or more per serving. Always check how much sodium is in a product, even if the label says "unsalted" or "no salt added." Shopping  Buy products labeled as "low-sodium" or "no salt added." Buy fresh foods. Avoid canned foods and pre-made or frozen meals. Avoid canned, cured, or processed meats. Buy breads that have less than 80 mg of sodium per slice. Cooking  Eat more home-cooked food. Try to eat less restaurant, buffet, and fast food. Try not to add salt when you cook. Use salt-free seasonings or herbs instead of table salt or sea salt. Check with your provider or pharmacist before using salt substitutes. Cook with plant-based oils, such as canola, sunflower, or olive oil. Meal planning When eating at a restaurant, ask if your food can be made with less salt or no salt. Avoid dishes labeled as brined, pickled, cured, or smoked. Avoid dishes made with soy sauce, miso, or teriyaki sauce. Avoid foods that  have monosodium glutamate (MSG) in them. MSG may be added to some restaurant food, sauces, soups, bouillon, and canned foods. Make meals that can be grilled, baked, poached, roasted, or steamed. These are often made with less sodium. General information Try to limit your sodium intake to 1,500-2,300 mg each day, or the amount told by your provider. What foods should I eat? Fruits Fresh, frozen, or canned  fruit. Fruit juice. Vegetables Fresh or frozen vegetables. "No salt added" canned vegetables. "No salt added" tomato sauce and paste. Low-sodium or reduced-sodium tomato and vegetable juice. Grains Low-sodium cereals, such as oats, puffed wheat and rice, and shredded wheat. Low-sodium crackers. Unsalted rice. Unsalted pasta. Low-sodium bread. Whole grain breads and whole grain pasta. Meats and other proteins Fresh or frozen meat, poultry, seafood, and fish. These should have no added salt. Low-sodium canned tuna and salmon. Unsalted nuts. Dried peas, beans, and lentils without added salt. Unsalted canned beans. Eggs. Unsalted nut butters. Dairy Milk. Soy milk. Cheese that is naturally low in sodium, such as ricotta cheese, fresh mozzarella, or Swiss cheese. Low-sodium or reduced-sodium cheese. Cream cheese. Yogurt. Seasonings and condiments Fresh and dried herbs and spices. Salt-free seasonings. Low-sodium mustard and ketchup. Sodium-free salad dressing. Sodium-free light mayonnaise. Fresh or refrigerated horseradish. Lemon juice. Vinegar. Other foods Homemade, reduced-sodium, or low-sodium soups. Unsalted popcorn and pretzels. Low-salt or salt-free chips. The items listed above may not be all the foods and drinks you can have. Talk to a dietitian to learn more. What foods should I avoid? Vegetables Sauerkraut, pickled vegetables, and relishes. Olives. [place] fries. Onion rings. Regular canned vegetables, except low-sodium or reduced-sodium items. Regular canned tomato sauce and paste. Regular tomato and vegetable juice. Frozen vegetables in sauces. Grains Instant hot cereals. Bread stuffing, pancake, and biscuit mixes. Croutons. Seasoned rice or pasta mixes. Noodle soup cups. Boxed or frozen macaroni and cheese. Regular salted crackers. Self-rising flour. Meats and other proteins Meat or fish that is salted, canned, smoked, spiced, or pickled. Precooked or cured meat, such as sausages or meat  loaves. Michelle Sherman. Ham. Pepperoni. Hot dogs. Corned beef. Chipped beef. Salt pork. Jerky. Pickled herring, anchovies, and sardines. Regular canned tuna. Salted nuts. Dairy Processed cheese and cheese spreads. Hard cheeses. Cheese curds. Blue cheese. Feta cheese. String cheese. Regular cottage cheese. Buttermilk. Canned milk. Fats and oils Salted butter. Regular margarine. Ghee. Bacon fat. Seasonings and condiments Onion salt, garlic salt, seasoned salt, table salt, and sea salt. Canned and packaged gravies. Worcestershire sauce. Tartar sauce. Barbecue sauce. Teriyaki sauce. Soy sauce, including reduced-sodium soy sauce. Steak sauce. Fish sauce. Oyster sauce. Cocktail sauce. Horseradish that you find on the shelf. Regular ketchup and mustard. Meat flavorings and tenderizers. Bouillon cubes. Hot sauce. Pre-made or packaged marinades. Pre-made or packaged taco seasonings. Relishes. Regular salad dressings. Salsa. Other foods Salted popcorn and pretzels. Corn chips and puffs. Potato and tortilla chips. Canned or dried soups. Pizza. Frozen entrees and pot pies. The items listed above may not be all the foods and drinks you should avoid. Talk to a dietitian to learn more. This information is not intended to replace advice given to you by your health care provider. Make sure you discuss any questions you have with your health care provider. Document Revised: 01/26/2022 Document Reviewed: 01/26/2022 Elsevier Patient Education  2024 [employer].

## 2022-08-24 ENCOUNTER — Encounter: Payer: Self-pay | Admitting: Nurse Practitioner

## 2022-08-28 ENCOUNTER — Encounter: Payer: Self-pay | Admitting: Nurse Practitioner

## 2022-08-28 DIAGNOSIS — M25561 Pain in right knee: Secondary | ICD-10-CM | POA: Diagnosis not present

## 2022-09-04 ENCOUNTER — Ambulatory Visit
Admission: RE | Admit: 2022-09-04 | Discharge: 2022-09-04 | Disposition: A | Discharge status: Home / Self Care | Payer: Medicare PPO | Source: Ambulatory Visit | Attending: Nurse Practitioner | Admitting: Nurse Practitioner

## 2022-09-04 DIAGNOSIS — Z853 Personal history of malignant neoplasm of breast: Secondary | ICD-10-CM | POA: Diagnosis not present

## 2022-09-04 DIAGNOSIS — Z170 Estrogen receptor positive status [ER+]: Secondary | ICD-10-CM

## 2022-09-11 ENCOUNTER — Other Ambulatory Visit: Payer: Self-pay

## 2022-09-11 MED ORDER — LEVOTHYROXINE SODIUM 112 MCG PO TABS: 112.0000 ug | ORAL_TABLET | Freq: Every day | ORAL | 3 refills | Status: DC

## 2022-09-11 MED ORDER — TRAZODONE HCL 50 MG PO TABS: 25.0000 mg | ORAL_TABLET | Freq: Every evening | ORAL | 3 refills | Status: DC | PRN

## 2022-09-11 NOTE — Telephone Encounter (Signed)
Sent to provider 

## 2022-09-13 ENCOUNTER — Ambulatory Visit: Payer: Medicare Other | Admitting: Family Medicine

## 2022-09-14 ENCOUNTER — Ambulatory Visit (INDEPENDENT_AMBULATORY_CARE_PROVIDER_SITE_OTHER): Payer: Medicare PPO | Admitting: Family Medicine

## 2022-09-14 ENCOUNTER — Encounter: Payer: Self-pay | Admitting: Family Medicine

## 2022-09-14 VITALS — BP 120/74 | HR 78 | Temp 97.8°F | Resp 16 | Ht 60.0 in | Wt 178.2 lb

## 2022-09-14 DIAGNOSIS — C50411 Malignant neoplasm of upper-outer quadrant of right female breast: Secondary | ICD-10-CM

## 2022-09-14 DIAGNOSIS — I152 Hypertension secondary to endocrine disorders: Secondary | ICD-10-CM | POA: Diagnosis not present

## 2022-09-14 DIAGNOSIS — E785 Hyperlipidemia, unspecified: Secondary | ICD-10-CM | POA: Diagnosis not present

## 2022-09-14 DIAGNOSIS — E039 Hypothyroidism, unspecified: Secondary | ICD-10-CM | POA: Diagnosis not present

## 2022-09-14 DIAGNOSIS — Z170 Estrogen receptor positive status [ER+]: Secondary | ICD-10-CM

## 2022-09-14 DIAGNOSIS — E1159 Type 2 diabetes mellitus with other circulatory complications: Secondary | ICD-10-CM | POA: Diagnosis not present

## 2022-09-14 DIAGNOSIS — Z7984 Long term (current) use of oral hypoglycemic drugs: Secondary | ICD-10-CM | POA: Diagnosis not present

## 2022-09-14 DIAGNOSIS — E1169 Type 2 diabetes mellitus with other specified complication: Secondary | ICD-10-CM

## 2022-09-14 DIAGNOSIS — E118 Type 2 diabetes mellitus with unspecified complications: Secondary | ICD-10-CM | POA: Diagnosis not present

## 2022-09-14 LAB — LIPID PANEL
Cholesterol: 117 mg/dL (ref 0–200)
HDL: 66.7 mg/dL (ref >39.00)
LDL Cholesterol: 24 mg/dL (ref 0–99)
NonHDL: 50.09
Total CHOL/HDL Ratio: 2
Triglycerides: 128 mg/dL (ref 0.0–149.0)
VLDL: 25.6 mg/dL (ref 0.0–40.0)

## 2022-09-14 LAB — BASIC METABOLIC PANEL
BUN: 18 mg/dL (ref 6–23)
CO2: 30 mEq/L (ref 19–32)
Calcium: 9.4 mg/dL (ref 8.4–10.5)
Chloride: 103 mEq/L (ref 96–112)
Creatinine, Ser: 0.59 mg/dL (ref 0.40–1.20)
GFR: 89.62 mL/min (ref >60.00)
Glucose, Bld: 166 mg/dL — ABNORMAL HIGH (ref 70–99)
Potassium: 4.5 mEq/L (ref 3.5–5.1)
Sodium: 139 mEq/L (ref 135–145)

## 2022-09-14 LAB — HEMOGLOBIN A1C: Hgb A1c MFr Bld: 7.8 % — ABNORMAL HIGH (ref 4.6–6.5)

## 2022-09-14 MED ORDER — METFORMIN HCL 1000 MG PO TABS: 1000.0000 mg | ORAL_TABLET | Freq: Two times a day (BID) | ORAL | 3 refills | Status: DC

## 2022-09-14 NOTE — Progress Notes (Signed)
SUBJECTIVE:   Chief Complaint  Patient presents with   Diabetes   HPI Patient presents clinic follow up chronic disease management  Hypertension Asymptomatic.  Takes ramipril 10 mg daily, and tolerating well.  Diabetes type 2 Asymptomatic.  Currently taking metformin 1000 mg twice daily and Jardiance 25 mg daily.  Tolerating medications well. A1c due today.  On ACEi and BP well controlled.  Hyperlipidemia Follows with Dr. Katrinka Blazing.  Takes Repatha 140 mg injections biweekly.  Also on Zetia 10 mg daily.  History of statin myopathy.  Requires Lipid panel today  Hypothyroidism Asymptomatic.  Tolerating levothyroxine 112 mcg daily.  Recent TSH normal  ER positive breast CA Follows with Dr. Parke Poisson.  Arimidex switched to Exemestane and no further joint pain.    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  Constitutional:      General: She is not in acute distress.    Appearance: She is not ill-appearing.  HENT:     Head: Normocephalic.     Nose: Nose normal.  Eyes:     Conjunctiva/sclera: Conjunctivae normal.  Cardiovascular:     Rate and Rhythm: Normal rate and regular rhythm.     Heart sounds: Normal heart sounds.  Pulmonary:     Effort: Pulmonary effort is normal.     Breath sounds: Normal breath sounds.  Abdominal:     General: Abdomen is flat. Bowel sounds are normal.     Palpations: Abdomen is soft.  Musculoskeletal:        General: Normal range of motion.     Cervical back: Normal range of motion.  Skin:    General: Skin is warm and dry.     Findings: No erythema, lesion or petechiae. Rash is not crusting, macular, papular, purpuric, pustular, urticarial or  vesicular.  Neurological:     Mental Status: She is alert and oriented to person, place, and time. Mental status is at baseline.  Psychiatric:        Mood and Affect: Mood normal.        Behavior: Behavior normal.        Thought Content: Thought content normal.        Judgment: Judgment normal.     ASSESSMENT/PLAN:  Type 2 diabetes mellitus with complications (HCC) Assessment & Plan: Chronic. A1c today increased 7.8 Start Rybelsus 3 mg daily Continue Jardiance 25 mg daily Refill Metformin 1000 mg BID On ACEi and PSCK9   Orders: -     metFORMIN HCl; Take 1 tablet (1,000 mg total) by mouth 2 (two) times daily with a meal.  Dispense: 180 tablet; Refill: 3 -     Hemoglobin A1c  Hypertension associated with diabetes (HCC) Assessment & Plan: Chronic. Well-controlled. Continue ramipril 10 mg daily Check Bmet  Orders: -     Basic metabolic panel  Hyperlipidemia associated with type 2 diabetes mellitus (HCC) Assessment & Plan: Follows with Dr. Katrinka Blazing at lipid clinic. Currently on Repatha 140 mg injectable biweekly Continue Zetia 10 mg daily Recheck lipids today   Orders: -     Lipid panel  Acquired hypothyroidism Assessment &  Plan: Chronic Continue levothyroxine 112 mcg daily TSH annually   Malignant neoplasm of upper-outer quadrant of right breast in female, estrogen receptor positive (HCC) Assessment & Plan: ER positive Arimidex switched to Exemestane and side effect of joint pains have resolved Follows with Oncology     PDMP reviewed  Return in about 6 months (around 03/17/2023) for PCP.  Dana Allan, MD

## 2022-09-14 NOTE — Patient Instructions (Addendum)
It was a pleasure meeting you today. Thank you for allowing me to take part in your health care.  Our goals for today as we discussed include:  Blood pressure good Continue current medication  We will get some labs today.  If they are abnormal or we need to do something about them, I will call you.  If they are normal, I will send you a message on MyChart (if it is active) or a letter in the mail.  If you don't hear from [place] in 2 weeks, please call the office at the number below.  Follow up for Colonoscopy with Dr Loreta Ave.  Due 08/2022   Follow up in 6 months Foot exam at next visit   If you have any questions or concerns, please do not hesitate to call the office at [phone].  I look forward to our next visit and until then take care and stay safe.  Regards,   Dana Allan, MD   [facility]

## 2022-09-15 ENCOUNTER — Encounter: Payer: Self-pay | Admitting: *Deleted

## 2022-09-15 ENCOUNTER — Other Ambulatory Visit: Payer: Self-pay | Admitting: Family Medicine

## 2022-09-15 DIAGNOSIS — E118 Type 2 diabetes mellitus with unspecified complications: Secondary | ICD-10-CM

## 2022-09-15 MED ORDER — RYBELSUS 3 MG PO TABS: 3.0000 mg | ORAL_TABLET | Freq: Every day | ORAL | 0 refills | Status: DC

## 2022-09-18 DIAGNOSIS — Z08 Encounter for follow-up examination after completed treatment for malignant neoplasm: Secondary | ICD-10-CM | POA: Diagnosis not present

## 2022-09-18 DIAGNOSIS — D2271 Melanocytic nevi of right lower limb, including hip: Secondary | ICD-10-CM | POA: Diagnosis not present

## 2022-09-18 DIAGNOSIS — Z1283 Encounter for screening for malignant neoplasm of skin: Secondary | ICD-10-CM | POA: Diagnosis not present

## 2022-09-18 DIAGNOSIS — B078 Other viral warts: Secondary | ICD-10-CM | POA: Diagnosis not present

## 2022-09-18 DIAGNOSIS — D225 Melanocytic nevi of trunk: Secondary | ICD-10-CM | POA: Diagnosis not present

## 2022-09-18 DIAGNOSIS — D485 Neoplasm of uncertain behavior of skin: Secondary | ICD-10-CM | POA: Diagnosis not present

## 2022-09-18 DIAGNOSIS — Z85820 Personal history of malignant melanoma of skin: Secondary | ICD-10-CM | POA: Diagnosis not present

## 2022-09-26 ENCOUNTER — Other Ambulatory Visit: Payer: Self-pay

## 2022-09-26 ENCOUNTER — Encounter: Payer: Self-pay | Admitting: Nurse Practitioner

## 2022-09-26 ENCOUNTER — Other Ambulatory Visit: Payer: Self-pay | Admitting: Nurse Practitioner

## 2022-09-26 MED ORDER — EXEMESTANE 25 MG PO TABS: 25.0000 mg | ORAL_TABLET | Freq: Every day | ORAL | 3 refills | Status: DC

## 2022-09-30 ENCOUNTER — Encounter: Payer: Self-pay | Admitting: Family Medicine

## 2022-09-30 DIAGNOSIS — C50919 Malignant neoplasm of unspecified site of unspecified female breast: Secondary | ICD-10-CM | POA: Insufficient documentation

## 2022-09-30 NOTE — Assessment & Plan Note (Signed)
Chronic Continue levothyroxine 112 mcg daily TSH annually

## 2022-09-30 NOTE — Assessment & Plan Note (Signed)
Chronic. A1c today increased 7.8 Start Rybelsus 3 mg daily Continue Jardiance 25 mg daily Refill Metformin 1000 mg BID On ACEi and PSCK9

## 2022-09-30 NOTE — Assessment & Plan Note (Signed)
ER positive Arimidex switched to Exemestane and side effect of joint pains have resolved Follows with Oncology

## 2022-09-30 NOTE — Assessment & Plan Note (Signed)
Chronic. Well-controlled. Continue ramipril 10 mg daily Check Bmet

## 2022-09-30 NOTE — Assessment & Plan Note (Signed)
Follows with Dr. Katrinka Blazing at lipid clinic. Currently on Repatha 140 mg injectable biweekly Continue Zetia 10 mg daily Recheck lipids today

## 2022-10-03 ENCOUNTER — Ambulatory Visit
Admission: RE | Admit: 2022-10-03 | Discharge: 2022-10-03 | Disposition: A | Discharge status: Home / Self Care | Source: Ambulatory Visit | Attending: Nurse Practitioner

## 2022-10-03 DIAGNOSIS — E853 Secondary systemic amyloidosis: Secondary | ICD-10-CM | POA: Diagnosis not present

## 2022-10-03 DIAGNOSIS — C50411 Malignant neoplasm of upper-outer quadrant of right female breast: Secondary | ICD-10-CM

## 2022-10-03 DIAGNOSIS — E349 Endocrine disorder, unspecified: Secondary | ICD-10-CM | POA: Diagnosis not present

## 2022-10-03 DIAGNOSIS — N958 Other specified menopausal and perimenopausal disorders: Secondary | ICD-10-CM | POA: Diagnosis not present

## 2022-10-03 MED ORDER — RYBELSUS 3 MG PO TABS: 3.0000 mg | ORAL_TABLET | Freq: Every day | ORAL | 0 refills | Status: DC

## 2022-10-09 DIAGNOSIS — M1711 Unilateral primary osteoarthritis, right knee: Secondary | ICD-10-CM | POA: Diagnosis not present

## 2022-10-16 DIAGNOSIS — M1711 Unilateral primary osteoarthritis, right knee: Secondary | ICD-10-CM | POA: Diagnosis not present

## 2022-10-19 ENCOUNTER — Encounter: Payer: Self-pay | Admitting: Family Medicine

## 2022-10-20 NOTE — Telephone Encounter (Signed)
Called patient reviewed all information and repeated back to me. Will call if any questions. Will send [place] a message to schedule a 3 month follow up. Also pt is going to start medication and let us know how it is going in 2 weeks.

## 2022-10-23 DIAGNOSIS — M1711 Unilateral primary osteoarthritis, right knee: Secondary | ICD-10-CM | POA: Diagnosis not present

## 2022-10-26 DIAGNOSIS — T466X5A Adverse effect of antihyperlipidemic and antiarteriosclerotic drugs, initial encounter: Secondary | ICD-10-CM

## 2022-10-26 DIAGNOSIS — E1169 Type 2 diabetes mellitus with other specified complication: Secondary | ICD-10-CM

## 2022-10-26 MED ORDER — REPATHA SURECLICK 140 MG/ML ~~LOC~~ SOAJ: 1.0000 mL | SUBCUTANEOUS | 3 refills | Status: DC

## 2022-10-31 ENCOUNTER — Encounter: Payer: Self-pay | Admitting: Family Medicine

## 2022-10-31 DIAGNOSIS — E118 Type 2 diabetes mellitus with unspecified complications: Secondary | ICD-10-CM

## 2022-10-31 MED ORDER — RYBELSUS 3 MG PO TABS: 3.0000 mg | ORAL_TABLET | Freq: Every day | ORAL | 1 refills | Status: DC

## 2022-11-14 DIAGNOSIS — G4733 Obstructive sleep apnea (adult) (pediatric): Secondary | ICD-10-CM | POA: Diagnosis not present

## 2022-12-04 ENCOUNTER — Ambulatory Visit: Attending: General Surgery

## 2022-12-04 VITALS — Wt 184.5 lb

## 2022-12-04 DIAGNOSIS — Z483 Aftercare following surgery for neoplasm: Secondary | ICD-10-CM | POA: Insufficient documentation

## 2022-12-04 NOTE — Therapy (Signed)
OUTPATIENT PHYSICAL THERAPY SOZO SCREENING NOTE   Patient Name: Michelle Sherman MRN: [id number] DOB:1950/05/02, 72 y.o., female Today's Date: 12/04/2022  PCP: Dana Allan, MD REFERRING PROVIDER: Almond Lint, MD   PT End of Session - 12/04/22 1515     Visit Number 5   # unchanged due to screen only   PT Start Time 1512    PT Stop Time 1517    PT Time Calculation (min) 5 min    Activity Tolerance Patient tolerated treatment well    Behavior During Therapy [facility] for tasks assessed/performed             Past Medical History:  Diagnosis Date   Acquired trigger finger of left middle finger 04/11/2021   Acquired trigger finger of right index finger 04/11/2021   Acquired trigger finger of right middle finger 04/11/2021   Allergy    ANA positive 08/16/2021   Arthritis    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 lfts in 100s, resolved with cessation of lipitor. Normal liver ultrasound   Elevated CK 08/15/2021   Family history of adverse reaction to anesthesia    sister, hard to wake up    Family history of lung cancer    Family history of pancreatic cancer    Family history of pancreatic cancer    Genetic testing 10/06/2019   Negative genetic testing:  No pathogenic variants detected on the [facility] Breast Cancer STAT Panel + Common Hereditary Cancers Panel. 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 The report date is 10/05/2019.     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 I discussed option of draining seroma and ways to do this as well as risks.  I discussed that draining would likely make her breast less heavy feeling.  I reviewed that drainage would likely make the right breast smaller and the nipple point inferiorly.  I discussed that the seroma could recur and we would be in    Severe obesity (BMI 35.0-35.9 with comorbidity) (HCC) 11/01/2016   Sleep apnea    wears CPAP nightly   Thyroid disease    Ulcerative colitis [facility])    Past Surgical History:  Procedure Laterality Date   BREAST LUMPECTOMY Right 10/2019   BREAST LUMPECTOMY WITH RADIOACTIVE SEED AND SENTINEL LYMPH NODE BIOPSY Right 10/14/2019   Procedure: RIGHT BREAST LUMPECTOMY WITH RADIOACTIVE SEED AND SENTINEL LYMPH NODE BIOPSY;  Surgeon: Almond Lint, MD;  Location: [facility];  Service: General;  Laterality: Right;  CHOLECYSTECTOMY     RE-EXCISION OF BREAST LUMPECTOMY Right 11/11/2019   Procedure: RIGHT RE-EXCISION OF BREAST LUMPECTOMY;  Surgeon: Almond Lint, MD;  Location: [facility];  Service: General;  Laterality: Right;  RNFA   SKIN SURGERY  12/2015   Fatty tumor removed   TONSILLECTOMY AND ADENOIDECTOMY  1956   Patient Active Problem List   Diagnosis Date Noted   Breast cancer (HCC) 09/30/2022   Type 2 diabetes mellitus with complications (HCC) 03/18/2022   Need for tetanus booster 03/18/2022   Annual physical exam 03/18/2022   Obesity, Class  II, BMI 35-39.9, isolated (see actual BMI) 12/31/2021   Rash 12/31/2021   Bilateral carpal tunnel syndrome 04/12/2021   Hand arthritis 01/27/2021   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 She had a right lumpectomy and sentinel node biopsy (2 negative nodes) on 10/14/2019. It is ER/PR positive and HER2 negative with a Ki67 of 5%. She cares for her husband who is in a motorized wheelchair and has bilateral BKA.   PRECAUTIONS: right UE Lymphedema risk, None  SUBJECTIVE: Pt returns for her 6 month L-Dex screen.  PAIN:  Are you having pain? No  SOZO SCREENING: Patient was assessed today using the SOZO machine to determine the lymphedema index score. This was compared to her baseline score. It was determined that she is within the recommended range when compared to her baseline and no further action is needed at this time. She will continue SOZO screenings. These are done every 3 months for 2 years post operatively followed by every 6 months for 2 years, and then annually.      L-DEX FLOWSHEETS - 12/04/22 1500       L-DEX LYMPHEDEMA SCREENING   Measurement Type Unilateral    L-DEX MEASUREMENT EXTREMITY Upper Extremity    POSITION  Standing    DOMINANT SIDE Right    At Risk Side Right    BASELINE SCORE (UNILATERAL) -2.2    L-DEX SCORE (UNILATERAL) -0.6    VALUE CHANGE (UNILAT) 1.6            P: Cont 6 month  screens.   Hermenia Bers, PTA 12/04/2022, 3:17 PM

## 2022-12-07 DIAGNOSIS — C4361 Malignant melanoma of right upper limb, including shoulder: Secondary | ICD-10-CM | POA: Diagnosis not present

## 2022-12-08 DIAGNOSIS — M25561 Pain in right knee: Secondary | ICD-10-CM | POA: Diagnosis not present

## 2022-12-10 NOTE — Progress Notes (Signed)
Patient Care Team: Dana Allan, MD as PCP - General (Family Medicine) Waymon Budge, MD as Consulting Physician (Pulmonary Disease) Charna Elizabeth, MD as Consulting Physician (Gastroenterology) Sedalia Muta, PT as Physical Therapist (Physical Therapy) Vivi Barrack, DPM as Consulting Physician (Podiatry) Antony Contras, MD as Consulting Physician (Ophthalmology) Donnelly Angelica, RN as Oncology Nurse Navigator Pershing Proud, RN as Oncology Nurse Navigator Almond Lint, MD as Consulting Physician (General Surgery) Malachy Mood, MD as Consulting Physician (Hematology) Dorothy Puffer, MD as Consulting Physician (Radiation Oncology) Pollyann Samples, NP as Nurse Practitioner (Nurse Practitioner) Dahlia Byes, [facility] (Inactive) as Pharmacist (Pharmacist) Catalina Antigua, MD as Consulting Physician (Obstetrics and Gynecology)  Clinic Day:  12/17/2022  Referring physician: Dana Allan, MD  ASSESSMENT & PLAN:   Assessment & Plan: Malignant neoplasm of upper-outer quadrant of right breast in female, 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 -S/p right lumpectomy with Dr Donell Beers on 10/14/19 and right breast re-excision on 11/11/19 due to positive margins. Oncotype showed RS 0, low risk. -she completed adjuvant Radiation 12/22/19-01/19/20 -Se began adjuvant anastrozole in 01/2020. She is tolerating well overall, except rare hot flash and moderate joint pain mainly in her hands today.  This is likely multifactorial, but also related to AI.  -Due to increasing joint pain from anastrozole and arthritis, we decided together to hold anastrozole for 2-4 weeks to see how pain responds.  -Bilateral diagnostic mammogram done 09/04/2022 with benign results. -DEXA scan done 10/05/2022 showing normal bone density. -started exemestane 09/26/2022.  -Continue surveillance     Plan:  Labs reviewed  -CBC showing WBC 4.1; Hgb 15.1; Hct 46.4; Plt 318; Anc 3.8 -CMP - K 4.7; glucose 191; BUN 18; Creatinine 0.67; eGFR > 60; Ca 10.3; LFTs normal.    Referral made to [facility] for screening colonoscopy. -Reviewed results of most recent bilateral diagnostic mammogram.  This was done 09/04/2022.  Benign results. -Reviewed DEXA scan results from 10/03/2022 showing normal results. -Continue exemestane.  Tolerating better. -Labs with follow-up in 6 months.  The patient understands the plans discussed today and is in agreement with them.  She knows to contact our office if she develops concerns prior to her next appointment.  I provided 25 minutes of face-to-face time during this encounter and > 50% was spent counseling as documented under my assessment and plan.    Carlean Jews, NP  [facility] - [facility] DE[facility] [address] [place] [address] Dept: [phone] Dept Fax: [phone]   Orders Placed This Encounter  Procedures   Ambulatory referral to Gastroenterology    Referral Priority:   Routine    Referral Type:   Consultation    Referral Reason:   Specialty Services Required    Number of Visits Requested:   1      CHIEF COMPLAINT:  CC: f/u right breast cancer  Current Treatment:  exemestane daily   INTERVAL HISTORY:  Jace is here today for repeat clinical assessment. Patient has been getting colonoscopies every 2 years. High risk for cancer due to history of breast cancer and malignant melanoma. Was supposed to have colonoscopy in October, but was cancelled due to medications and not rescheduled. New referral made to [facility] for this. Was recently started Rybelsus to help with blood sugars. Has noted some mild, intermittent acid reflux  for this.  Currently  denies fevers or chills. She denies pain. Her appetite is good. Her weight has been stable.  I have reviewed the  past medical history, past surgical history, social history and family history with the patient and they are unchanged from previous note.  ALLERGIES:  is allergic to nickel, sulfa antibiotics, lipitor [atorvastatin], and bydureon [exenatide].  MEDICATIONS:  Current Outpatient Medications  Medication Sig Dispense Refill   aspirin EC 81 MG tablet Take 1 tablet by mouth daily.     Biotin 2500 MCG CAPS Take 1 capsule by mouth daily.     Cholecalciferol (VITAMIN D3) 2000 units capsule Take 1 capsule by mouth daily.     empagliflozin (JARDIANCE) 25 MG TABS tablet Take 1 tablet (25 mg total) by mouth daily before breakfast. 90 tablet 3   Evolocumab (REPATHA SURECLICK) 140 MG/ML SOAJ Inject 140 mg into the skin every 14 (fourteen) days. 6 mL 3   exemestane (AROMASIN) 25 MG tablet Take 1 tablet (25 mg total) by mouth daily after breakfast. 90 tablet 3   ezetimibe (ZETIA) 10 MG tablet Take 1 tablet (10 mg total) by mouth daily. 90 tablet 3   famotidine (PEPCID) 40 MG tablet Take 40 mg by mouth 2 (two) times daily.     fexofenadine (ALLEGRA) 180 MG tablet Take 180 mg by mouth daily.     fluticasone (FLONASE) 50 MCG/ACT nasal spray Place into the nose.     icosapent Ethyl (VASCEPA) 1 g capsule Take 1 capsule (1 g total) by mouth 2 (two) times daily. 180 capsule 3   levothyroxine (SYNTHROID) 112 MCG tablet Take 1 tablet (112 mcg total) by mouth daily. 90 tablet 3   mesalamine (LIALDA) 1.2 g EC tablet Take by mouth. Take 2 tablet in am and 2 tablet in pm     metFORMIN (GLUCOPHAGE) 1000 MG tablet Take 1 tablet (1,000 mg total) by mouth 2 (two) times daily with a meal. 180 tablet 3   Misc Natural Products (TURMERIC CURCUMIN) CAPS Take 1 capsule by mouth daily.     Multiple Vitamin (MULTIVITAMIN) tablet Take 1 tablet by mouth daily.     ramipril (ALTACE) 10 MG capsule Take 1 capsule (10 mg total) by mouth daily. 90 capsule 3   Semaglutide (RYBELSUS) 3 MG TABS Take 1 tablet (3 mg total) by mouth daily. 90  tablet 1   traZODone (DESYREL) 50 MG tablet Take 0.5-1 tablets (25-50 mg total) by mouth at bedtime as needed for sleep. 90 tablet 3   No current facility-administered medications for this visit.    HISTORY OF PRESENT ILLNESS:   Oncology History Overview Note  Cancer Staging Malignant neoplasm of upper-outer quadrant of right breast in female, estrogen receptor positive (HCC) Staging form: Breast, AJCC 8th Edition - Clinical stage from 09/12/2019: Stage IA (cT1b, cN0, cM0, G1, ER+, PR+, HER2-) - Signed by Malachy Mood, MD on 09/23/2019    Malignant neoplasm of upper-outer quadrant of right breast in female, estrogen receptor positive (HCC) (Resolved)  09/05/2019 Mammogram   IMPRESSION: Indeterminate 9 x 6 x 6 mm mass involving the OUTER RIGHT breast at the 9 o'clock position approximately 5 cm from the nipple at POSTERIOR depth, located anterior to a normal appearing intramammary lymph Node.      09/12/2019 Cancer Staging   Staging form: Breast, AJCC 8th Edition - Clinical stage from 09/12/2019: Stage IA (cT1b, cN0, cM0, G1, ER+, PR+, HER2-) - Signed by Malachy Mood, MD on 09/23/2019   09/12/2019 Initial  Biopsy   Diagnosis 1. Breast, right, needle core biopsy, 9 o'clock posterior - INVASIVE DUCTAL CARCINOMA WITH PAPILLARY FEATURES. 2. Breast, right, needle core biopsy, 9 o'clock anterior - INVASIVE DUCTAL CARCINOMA WITH PAPILLARY FEATURES. Microscopic Comment 1. - 2. The specimens share similar morphologic features. E-cadherin is positive and CK5/6 is negative. P63, Calponin and SMM-1 demonstrate the absence of myoepithelium. Ancillary studies will be reported separately. Results reported to [facility] on 09/15/2019. Intradepartmental consultation (Dr. Berneice Heinrich).   09/12/2019 Receptors her2   1. PROGNOSTIC INDICATORS Results: IMMUNOHISTOCHEMICAL AND MORPHOMETRIC ANALYSIS PERFORMED MANUALLY The tumor cells are NEGATIVE for Her2 (1+). Estrogen Receptor: 99%, POSITIVE,  STRONG STAINING INTENSITY Progesterone Receptor: 99%, POSITIVE, STRONG STAINING INTENSITY Proliferation Marker Ki67: 5%   09/18/2019 Initial Diagnosis   Malignant neoplasm of upper-outer quadrant of right breast in female, estrogen receptor positive (HCC)   10/05/2019 Genetic Testing   Negative genetic testing:  No pathogenic variants detected on the [facility] Breast Cancer STAT Panel + Common Hereditary Cancers Panel. 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 The report date is 10/05/2019.  The Breast Cancer STAT Panel offered by [facility] includes sequencing and deletion/duplication analysis for the following 9 genes:  ATM, BRCA1, BRCA2, CDH1, CHEK2, PALB2, PTEN, STK11 and TP53. The Common Hereditary Cancers Panel offered by [facility] includes sequencing and/or deletion duplication testing of the following 48 genes: APC, ATM, AXIN2, BARD1, BMPR1A, BRCA1, BRCA2, BRIP1, CDH1, CDK4, CDKN2A (p14ARF), CDKN2A (p16INK4a), CHEK2, CTNNA1, DICER1, EPCAM (Deletion/duplication testing only), GREM1 (promoter region deletion/duplication testing only), KIT, MEN1, MLH1, MSH2, MSH3, MSH6, MUTYH, NBN, NF1, NTHL1, PALB2, PDGFRA, PMS2, POLD1, POLE, PTEN, RAD50, RAD51C, RAD51D, RNF43, SDHB, SDHC, SDHD, SMAD4, SMARCA4. STK11, TP53, TSC1, TSC2, and VHL.  The following genes were evaluated for sequence changes only: SDHA and HOXB13 c.251G>A variant only.    10/14/2019 Pathology Results   RIGHT BREAST LUMPECTOMY WITH RADIOACTIVE SEED AND SENTINEL LYMPH NODE BIOPSY by Dr Donell Beers    FINAL MICROSCOPIC DIAGNOSIS:   A. BREAST, RIGHT, LUMPECTOMY:  - Multifocal invasive ductal carcinoma with papillary features, grade 2,  spanning 0.9 cm and 0.5 cm.  - Intermediate grade ductal carcinoma in situ.  - Invasive carcinoma present at original inferior margin broadly, final  inferior margin (Part F) is negative.  - Margins are negative for in situ carcinoma.  - Biopsy site.  - See oncology  table.   B. BREAST, RIGHT ADDITIONAL POSTERIOR MARGIN, EXCISION:  - Benign breast tissue.   C. BREAST, RIGHT ADDITIONAL LATERAL MARGIN, EXCISION:  - Benign breast tissue.   D. BREAST, RIGHT ADDITIONAL SUPERIOR MARGIN, EXCISION:  - Benign breast tissue.   E. BREAST, RIGHT ADDITIONAL MEDIAL MARGIN, EXCISION:  - Invasive ductal carcinoma with papillary features, grade 2, spanning  0.5 cm.  - Invasive carcinoma is present at the new medial margin broadly.   F. BREAST, RIGHT ADDITIONAL INFERIOR MARGIN, EXCISION:  - Benign breast tissue.   G. LYMPH NODE, RIGHT AXILLARY #1, SENTINEL, EXCISION:  - One of one lymph nodes negative for carcinoma (0/1).   H. LYMPH NODE, RIGHT AXILLARY #2, SENTINEL, EXCISION:  - One of one lymph nodes negative for carcinoma (0/1).   10/14/2019 Oncotype testing   Oncotype  Recurrence Score 0 with distant recurrence risk at 9 years of 3%.  There is less then 1% benefit of chemotherapy   10/14/2019 Cancer Staging   Staging form: Breast, AJCC 8th Edition - Pathologic stage from 10/14/2019: Stage IA (pT1b, pN0, cM0,  G2, ER+, PR+, HER2-, Oncotype DX score: 0) - Signed by Malachy Mood, MD on 01/29/2020   11/11/2019 Pathology Results   RIGHT RE-EXCISION OF BREAST LUMPECTOMY by Dr Donell Beers    FINAL MICROSCOPIC DIAGNOSIS:   A. BREAST, RIGHT, RE-EXCISION OF MEDIAL MARGIN:  - Prior procedure site changes.  - No carcinoma identified.   COMMENT:   P63, Calponin and SMM-1 demonstrate the presence of myoepithelium in the  select focus.    12/22/2019 - 01/19/2020 Radiation Therapy   Adjuvant Radiation with Dr Mitzi Hansen    05/06/2020 Survivorship   SCP delivered by Santiago Glad, NP    09/04/2022 Mammogram   Bilateral diagnostic 3D mammogram IMPRESSION: No evidence of breast malignancy.   RECOMMENDATION: Per protocol, as the patient is now 2 or more years status post lumpectomy, she may return to annual screening mammography in 1 year. However, given the history of  breast cancer, the patient remains eligible for annual diagnostic mammography if preferred. (Code:SM-B-01Y)   BI-RADS CATEGORY  2: Benign.       10/05/2022 Imaging   Bone density test  Normal - significant change noted in bone density of femur       REVIEW OF SYSTEMS:   Constitutional: Denies fevers, chills or abnormal weight loss Eyes: Denies blurriness of vision Ears, nose, mouth, throat, and face: Denies mucositis or sore throat Respiratory: Denies cough, dyspnea or wheezes Cardiovascular: Denies palpitation, chest discomfort or lower extremity swelling Gastrointestinal:  Denies nausea, heartburn or change in bowel habits Skin: Denies abnormal skin rashes Lymphatics: Denies new lymphadenopathy or easy bruising Neurological:Denies numbness, tingling or new weaknesses Behavioral/Psych: Mood is stable, no new changes  All other systems were reviewed with the patient and are negative.   VITALS:   Today's Vitals   12/12/22 1020  BP: 131/63  Pulse: 75  Resp: 19  Temp: 98.2 F (36.8 C)  TempSrc: Temporal  SpO2: 99%  Weight: 184 lb 4.8 oz (83.6 kg)   Body mass index is 35.99 kg/m.   Wt Readings from Last 3 Encounters:  12/12/22 184 lb 4.8 oz (83.6 kg)  12/04/22 184 lb 8 oz (83.7 kg)  09/14/22 178 lb 4 oz (80.9 kg)    Body mass index is 35.99 kg/m.  Performance status (ECOG): 0 - Asymptomatic  PHYSICAL EXAM:   GENERAL:alert, no distress and comfortable SKIN: skin color, texture, turgor are normal, no rashes or significant lesions EYES: normal, Conjunctiva are pink and non-injected, sclera clear OROPHARYNX:no exudate, no erythema and lips, buccal mucosa, and tongue normal  NECK: supple, thyroid normal size, non-tender, without nodularity LYMPH:  no palpable lymphadenopathy in the cervical, axillary or inguinal LUNGS: clear to auscultation and percussion with normal breathing effort HEART: regular rate & rhythm and no murmurs and no lower extremity  edema ABDOMEN:abdomen soft, non-tender and normal bowel sounds Musculoskeletal:no cyanosis of digits and no clubbing  NEURO: alert & oriented x 3 with fluent speech, no focal motor/sensory deficits BREAST: No nipple discharge or inversion.  S/p right lumpectomy, incisions completely healed with scar tissue.  Lumpy breast tissue bilaterally without discrete mass or nodularity in either breast palpable today. No axillary lymphadenopathy present bilaterally.   LABORATORY DATA:  I have reviewed the data as listed    Component Value Date/Time   NA 139 12/12/2022 0959   NA 141 02/15/2022 1038   K 4.7 12/12/2022 0959   CL 104 12/12/2022 0959   CO2 29 12/12/2022 0959   GLUCOSE 191 (H) 12/12/2022 0959   BUN 18  12/12/2022 0959   BUN 15 02/15/2022 1038   CREATININE 0.67 12/12/2022 0959   CREATININE 0.55 (L) 01/07/2020 0907   CALCIUM 10.3 12/12/2022 0959   PROT 7.3 12/12/2022 0959   ALBUMIN 4.4 12/12/2022 0959   AST 14 (L) 12/12/2022 0959   ALT 14 12/12/2022 0959   ALKPHOS 79 12/12/2022 0959   BILITOT 0.5 12/12/2022 0959   GFRNONAA >60 12/12/2022 0959   GFRNONAA 95 01/07/2020 0907   GFRAA 110 01/07/2020 0907    Lab Results  Component Value Date   WBC 6.1 12/12/2022   NEUTROABS 3.8 12/12/2022   HGB 15.1 (H) 12/12/2022   HCT 46.4 (H) 12/12/2022   MCV 93.0 12/12/2022   PLT 318 12/12/2022

## 2022-12-10 NOTE — Assessment & Plan Note (Addendum)
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 -S/p right lumpectomy with Dr Donell Beers on 10/14/19 and right breast re-excision on 11/11/19 due to positive margins. Oncotype showed RS 0, low risk. -she completed adjuvant Radiation 12/22/19-01/19/20 -Se began adjuvant anastrozole in 01/2020. She is tolerating well overall, except rare hot flash and moderate joint pain mainly in her hands today.  This is likely multifactorial, but also related to AI.  -Due to increasing joint pain from anastrozole and arthritis, we decided together to hold anastrozole for 2-4 weeks to see how pain responds.  -Bilateral diagnostic mammogram done 09/04/2022 with benign results. -DEXA scan done 10/05/2022 showing normal bone density. -started exemestane 09/26/2022.  -Continue surveillance

## 2022-12-12 ENCOUNTER — Telehealth: Payer: Self-pay | Admitting: Family Medicine

## 2022-12-12 ENCOUNTER — Inpatient Hospital Stay (HOSPITAL_BASED_OUTPATIENT_CLINIC_OR_DEPARTMENT_OTHER): Payer: Medicare PPO | Admitting: Nurse Practitioner

## 2022-12-12 ENCOUNTER — Inpatient Hospital Stay: Payer: Medicare PPO | Attending: Nurse Practitioner

## 2022-12-12 VITALS — BP 131/63 | HR 75 | Temp 98.2°F | Resp 19 | Wt 184.3 lb

## 2022-12-12 DIAGNOSIS — Z7984 Long term (current) use of oral hypoglycemic drugs: Secondary | ICD-10-CM | POA: Insufficient documentation

## 2022-12-12 DIAGNOSIS — Z85820 Personal history of malignant melanoma of skin: Secondary | ICD-10-CM

## 2022-12-12 DIAGNOSIS — Z170 Estrogen receptor positive status [ER+]: Secondary | ICD-10-CM | POA: Diagnosis not present

## 2022-12-12 DIAGNOSIS — R232 Flushing: Secondary | ICD-10-CM | POA: Diagnosis not present

## 2022-12-12 DIAGNOSIS — Z79811 Long term (current) use of aromatase inhibitors: Secondary | ICD-10-CM | POA: Diagnosis not present

## 2022-12-12 DIAGNOSIS — M2550 Pain in unspecified joint: Secondary | ICD-10-CM | POA: Insufficient documentation

## 2022-12-12 DIAGNOSIS — C50411 Malignant neoplasm of upper-outer quadrant of right female breast: Secondary | ICD-10-CM

## 2022-12-12 DIAGNOSIS — Z1211 Encounter for screening for malignant neoplasm of colon: Secondary | ICD-10-CM | POA: Diagnosis not present

## 2022-12-12 DIAGNOSIS — Z7982 Long term (current) use of aspirin: Secondary | ICD-10-CM | POA: Diagnosis not present

## 2022-12-12 LAB — CBC WITH DIFFERENTIAL (CANCER CENTER ONLY)
Abs Immature Granulocytes: 0.02 10*3/uL (ref 0.00–0.07)
Basophils Absolute: 0.1 10*3/uL (ref 0.0–0.1)
Basophils Relative: 1 %
Eosinophils Absolute: 0.1 10*3/uL (ref 0.0–0.5)
Eosinophils Relative: 2 %
HCT: 46.4 % — ABNORMAL HIGH (ref 36.0–46.0)
Hemoglobin: 15.1 g/dL — ABNORMAL HIGH (ref 12.0–15.0)
Immature Granulocytes: 0 %
Lymphocytes Relative: 27 %
Lymphs Abs: 1.6 10*3/uL (ref 0.7–4.0)
MCH: 30.3 pg (ref 26.0–34.0)
MCHC: 32.5 g/dL (ref 30.0–36.0)
MCV: 93 fL (ref 80.0–100.0)
Monocytes Absolute: 0.5 10*3/uL (ref 0.1–1.0)
Monocytes Relative: 8 %
Neutro Abs: 3.8 10*3/uL (ref 1.7–7.7)
Neutrophils Relative %: 62 %
Platelet Count: 318 10*3/uL (ref 150–400)
RBC: 4.99 MIL/uL (ref 3.87–5.11)
RDW: 13 % (ref 11.5–15.5)
WBC Count: 6.1 10*3/uL (ref 4.0–10.5)
nRBC: 0 % (ref 0.0–0.2)

## 2022-12-12 LAB — CMP (CANCER CENTER ONLY)
ALT: 14 U/L (ref 0–44)
AST: 14 U/L — ABNORMAL LOW (ref 15–41)
Albumin: 4.4 g/dL (ref 3.5–5.0)
Alkaline Phosphatase: 79 U/L (ref 38–126)
Anion gap: 6 (ref 5–15)
BUN: 18 mg/dL (ref 8–23)
CO2: 29 mmol/L (ref 22–32)
Calcium: 10.3 mg/dL (ref 8.9–10.3)
Chloride: 104 mmol/L (ref 98–111)
Creatinine: 0.67 mg/dL (ref 0.44–1.00)
GFR, Estimated: >60 mL/min (ref >60)
Glucose, Bld: 191 mg/dL — ABNORMAL HIGH (ref 70–99)
Potassium: 4.7 mmol/L (ref 3.5–5.1)
Sodium: 139 mmol/L (ref 135–145)
Total Bilirubin: 0.5 mg/dL (ref <1.2)
Total Protein: 7.3 g/dL (ref 6.5–8.1)

## 2022-12-12 NOTE — Telephone Encounter (Signed)
Patient need lab orders.

## 2022-12-13 NOTE — Telephone Encounter (Signed)
Noted  

## 2022-12-17 ENCOUNTER — Encounter: Payer: Self-pay | Admitting: Nurse Practitioner

## 2022-12-18 ENCOUNTER — Other Ambulatory Visit: Payer: Medicare PPO

## 2022-12-18 NOTE — Telephone Encounter (Signed)
Patient came into office today to have her A1c checked. Patient is confused because Dr Clent Ridges wanted to check her A1c because she put her on  Semaglutide (RYBELSUS) 3 MG TABS and she wanted to see if the medication changed her A1c.

## 2023-01-05 ENCOUNTER — Telehealth: Payer: Self-pay | Admitting: Internal Medicine

## 2023-01-05 NOTE — Telephone Encounter (Signed)
Is the patient having GI symptoms? If so, then would recommended OV. Otherwise okay to schedule for direct colonoscopy to [facility].  Colonoscopy 08/25/20: Indication was change in bowel habits. UC diagnosed in 2013. Multiple small and large mouthed diverticula found in the sigmoid colon. Normal TI. Repeat colonoscopy recommended in 2 years.

## 2023-01-05 NOTE — Telephone Encounter (Signed)
Good afternoon Dr. Leonides Schanz Supervising MD PM  The following patient is being referred to [place] for a colonoscopy. She has previous GI history with Dr. Loreta Ave and is not happy with the care. She wants a second opinion to see if she can actually get some help. Records are available on Epic. Please review and advise of scheduling. Thank you.

## 2023-01-10 ENCOUNTER — Encounter: Payer: Self-pay | Admitting: Internal Medicine

## 2023-01-10 NOTE — Telephone Encounter (Signed)
Left VM for patient to call and schedule accordingly.

## 2023-02-01 DIAGNOSIS — H5213 Myopia, bilateral: Secondary | ICD-10-CM | POA: Diagnosis not present

## 2023-02-01 DIAGNOSIS — H2513 Age-related nuclear cataract, bilateral: Secondary | ICD-10-CM | POA: Diagnosis not present

## 2023-02-01 DIAGNOSIS — E119 Type 2 diabetes mellitus without complications: Secondary | ICD-10-CM | POA: Diagnosis not present

## 2023-02-12 ENCOUNTER — Ambulatory Visit (AMBULATORY_SURGERY_CENTER): Payer: Medicare Other

## 2023-02-12 VITALS — Ht 60.0 in | Wt 183.0 lb

## 2023-02-12 DIAGNOSIS — K51919 Ulcerative colitis, unspecified with unspecified complications: Secondary | ICD-10-CM

## 2023-02-12 NOTE — Progress Notes (Signed)
No egg or soy allergy known to patient  No issues known to pt with past sedation with any surgeries or procedures Patient denies ever being told they had issues or difficulty with intubation  No FH of Malignant Hyperthermia Pt is not on diet pills Pt is not on  home 02  OSA using CPAP  Pt is not on blood thinners  Pt denies issues with constipation  No A fib or A flutter Have any cardiac testing pending-- annual apt possible EKG  LOA: independent  Prep: Golytely   Patient's chart reviewed by Cathlyn Parsons CNRA prior to previsit and patient appropriate for the [facility].  Previsit completed and red dot placed by patient's name on their procedure day (on provider's schedule).     PV competed with patient. Prep instructions sent via mychart and home address. Pt has prep solution

## 2023-02-21 ENCOUNTER — Encounter: Payer: Self-pay | Admitting: Cardiology

## 2023-02-21 ENCOUNTER — Ambulatory Visit: Payer: Medicare Other | Attending: Cardiology | Admitting: Cardiology

## 2023-02-21 ENCOUNTER — Telehealth: Payer: Self-pay

## 2023-02-21 VITALS — BP 124/72 | HR 75 | Ht 60.0 in | Wt 182.2 lb

## 2023-02-21 DIAGNOSIS — Z0000 Encounter for general adult medical examination without abnormal findings: Secondary | ICD-10-CM

## 2023-02-21 DIAGNOSIS — Z7689 Persons encountering health services in other specified circumstances: Secondary | ICD-10-CM | POA: Diagnosis not present

## 2023-02-21 DIAGNOSIS — E119 Type 2 diabetes mellitus without complications: Secondary | ICD-10-CM | POA: Diagnosis not present

## 2023-02-21 DIAGNOSIS — E66812 Obesity, class 2: Secondary | ICD-10-CM

## 2023-02-21 NOTE — Telephone Encounter (Signed)
Called patient per health coaching referral from Dr. Servando Salina for healthy eating and physical activity. Patient expressed interest and requested an in-person health coaching appt. Patient has been scheduled in-person on 2/4 at 11:00pm.   Renaee Munda, MS, ERHD, [facility]  Care Guide, Health & Wellness Coach [address] [place] [address] Telephone: [phone] Email: [email or web address]

## 2023-02-21 NOTE — Progress Notes (Addendum)
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 Discuss with patient their interest in participating in the free health coaching program and refer to REF [id number]/Care Navigation.

## 2023-02-21 NOTE — Patient Instructions (Addendum)
Medication Instructions:  Your physician recommends that you continue on your current medications as directed. Please refer to the Current Medication list given to you today.  *If you need a refill on your cardiac medications before your next appointment, please call your pharmacy*   Lab Work: HgbA1c If you have labs (blood work) drawn today and your tests are completely normal, you will receive your results only by: MyChart Message (if you have MyChart) OR A paper copy in the mail If you have any lab test that is abnormal or we need to change your treatment, we will call you to review the results.  Follow-Up: At [facility], you and your health needs are our priority.  As part of our continuing mission to provide you with exceptional heart care, we have created designated Provider Care Teams.  These Care Teams include your primary Cardiologist (physician) and Advanced Practice Providers (APPs -  Physician Assistants and Nurse Practitioners) who all work together to provide you with the care you need, when you need it.  Your next appointment:   1 year(s)  Provider:   Thomasene Ripple, DO    Other Instructions:

## 2023-02-22 LAB — HEMOGLOBIN A1C
Est. average glucose Bld gHb Est-mCnc: 166 mg/dL
Hgb A1c MFr Bld: 7.4 % — ABNORMAL HIGH (ref 4.8–5.6)

## 2023-02-23 ENCOUNTER — Encounter: Payer: Self-pay | Admitting: Internal Medicine

## 2023-02-24 NOTE — Progress Notes (Signed)
Cardiology Office Note:    Date:  02/24/2023   ID:  Michelle Sherman, DOB 28-Dec-1950, MRN [id number]  PCP:  Dana Allan, MD  Cardiologist:  Thomasene Ripple, DO  Electrophysiologist:  None   Referring MD: Dana Allan, MD   " I am doing well"   History of Present Illness:    Michelle Sherman is a 73 y.o. female with a hx of  with a history of breast cancer status post lumpectomy, diabetes mellitus, sleep apnea on CPAP, thyroid disease, and coronary artery disease with moderate stenosis in the left anterior descending artery, presents for a routine follow-up. She reports adherence to her current medication regimen, including Repatha, Zetia, Vascepa, Metformin, and Ramipril.  The patient's last A1c check was in August of the previous year, at which time it had increased. She was subsequently started on Rybelsus by her primary care provider, but an A1c check three months later was not performed as expected. The patient expresses dissatisfaction with her current primary care provider and is considering finding a new one.  The patient also expresses a desire to lose weight and is open to working with a Control and instrumentation engineer. She has a goal of losing an additional 40 pounds.  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 lfts in 100s, resolved with cessation of lipitor. Normal liver ultrasound    Elevated CK 08/15/2021   Family history of adverse reaction to anesthesia    sister, hard to wake up    Family history of lung cancer    Family history of pancreatic cancer    Family history of pancreatic cancer    Genetic testing 10/06/2019   Negative genetic testing:  No pathogenic variants detected on the [facility] Breast Cancer STAT Panel + Common Hereditary Cancers Panel. 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 The report date is 10/05/2019.     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 I discussed option of draining seroma and ways to do this as well as risks.  I discussed that draining would likely make her breast less heavy feeling.  I reviewed that drainage would likely make the right breast smaller and the nipple point inferiorly.  I discussed  that the seroma could recur and we would be in    Severe obesity (BMI 35.0-35.9 with comorbidity) (HCC) 11/01/2016   Sleep apnea    wears CPAP nightly   Thyroid disease    Ulcerative colitis [facility])     Past Surgical History:  Procedure Laterality Date   BREAST LUMPECTOMY Right 10/2019   BREAST LUMPECTOMY WITH RADIOACTIVE SEED AND SENTINEL LYMPH NODE BIOPSY Right 10/14/2019   Procedure: RIGHT BREAST LUMPECTOMY WITH RADIOACTIVE SEED AND SENTINEL LYMPH NODE BIOPSY;  Surgeon: Almond Lint, MD;  Location: [facility];  Service: General;  Laterality: Right;   CHOLECYSTECTOMY     RE-EXCISION OF BREAST LUMPECTOMY Right 11/11/2019   Procedure: RIGHT RE-EXCISION OF BREAST LUMPECTOMY;  Surgeon: Almond Lint, MD;  Location: [facility];  Service: General;  Laterality: Right;  RNFA   SKIN SURGERY  12/2015   Fatty tumor removed   TONSILLECTOMY AND ADENOIDECTOMY  1956    Current Medications: Current Meds  Medication Sig   aspirin EC 81 MG tablet Take 1 tablet by mouth daily.   Biotin 2500 MCG CAPS Take 1 capsule by mouth daily.   Cholecalciferol (VITAMIN D3) 2000 units capsule Take 1 capsule by mouth daily.   empagliflozin (JARDIANCE) 25 MG TABS tablet Take 1 tablet (25 mg total) by mouth daily before breakfast.   Evolocumab (REPATHA SURECLICK) 140 MG/ML SOAJ Inject 140 mg into the skin every 14 (fourteen) days.   exemestane (AROMASIN) 25 MG tablet Take 1 tablet (25 mg total) by mouth daily after breakfast.   ezetimibe (ZETIA) 10 MG tablet Take 1 tablet (10 mg total) by mouth daily.   famotidine (PEPCID) 40 MG tablet Take 40 mg by mouth 2 (two) times daily.   fexofenadine (ALLEGRA) 180 MG tablet Take 180 mg by mouth daily as needed.   fluticasone (FLONASE) 50 MCG/ACT nasal spray Place 1 spray into both nostrils daily as needed.   icosapent Ethyl (VASCEPA) 1 g capsule Take 1 capsule (1 g total) by mouth 2 (two) times daily.   levothyroxine (SYNTHROID) 112 MCG tablet Take 1 tablet (112 mcg total) by mouth daily.   mesalamine (LIALDA) 1.2 g EC tablet Take by mouth. Take 2 tablet in am and 2 tablet in pm   metFORMIN (GLUCOPHAGE) 1000 MG tablet Take 1 tablet (1,000 mg total) by mouth 2 (two) times daily with a meal.   Misc Natural Products (TURMERIC CURCUMIN) CAPS Take 1 capsule by mouth daily. 1000 mg   Multiple Vitamin (MULTIVITAMIN) tablet Take 1 tablet by mouth daily.   polyethylene glycol (GOLYTELY) 236 g solution See admin instructions.   ramipril  (ALTACE) 10 MG capsule Take 1 capsule (10 mg total) by mouth daily.   Semaglutide (RYBELSUS) 3 MG TABS Take 1 tablet (3 mg total) by mouth daily.   traZODone (DESYREL) 50 MG tablet Take 0.5-1 tablets (25-50 mg total) by mouth at bedtime as needed for sleep.     Allergies:   Nickel, Sulfa antibiotics, Lipitor [atorvastatin], and Bydureon [exenatide]   Social History   Socioeconomic History   Marital status: Married    Spouse name: Not on file   Number of children: 1   Years of education: Not on file   Highest education level: Not on file  Occupational History   Occupation: Retired     Comment: Retail buyer   Tobacco Use   Smoking status: Never   Smokeless tobacco: Never  Vaping Use   Vaping status: Never Used  Substance and Sexual  Activity   Alcohol use: Yes    Comment: occa   Drug use: No   Sexual activity: Yes    Birth control/protection: Post-menopausal  Other Topics Concern   Not on file  Social History Narrative   Married, cares for disabled husband,  no tob, Etoh or drug use; no exercise   Social Drivers of Corporate investment banker Strain: Low Risk  (07/13/2022)   Overall Financial Resource Strain (CARDIA)    Difficulty of Paying Living Expenses: Not hard at all  Food Insecurity: No Food Insecurity (07/13/2022)   Hunger Vital Sign    Worried About Running Out of Food in the Last Year: Never true    Ran Out of Food in the Last Year: Never true  Transportation Needs: No Transportation Needs (07/13/2022)   PRAPARE - Administrator, Civil Service (Medical): No    Lack of Transportation (Non-Medical): No  Physical Activity: Inactive (07/13/2022)   Exercise Vital Sign    Days of Exercise per Week: 0 days    Minutes of Exercise per Session: 0 min  Stress: No Stress Concern Present (07/13/2022)   [employer] of Occupational Health - Occupational Stress Questionnaire    Feeling of Stress : Not at all  Social Connections: Socially Integrated (07/13/2022)    Social Connection and Isolation Panel [NHANES]    Frequency of Communication with Friends and Family: More than three times a week    Frequency of Social Gatherings with Friends and Family: More than three times a week    Attends Religious Services: More than 4 times per year    Active Member of [employer] or Organizations: Yes    Attends Engineer, structural: More than 4 times per year    Marital Status: Married     Family History: The patient's family history includes Cancer in her paternal aunt; Colon cancer (age of onset: 85 - 71) in her brother; Crohn's disease in her brother; Diabetes in her brother, brother, maternal aunt, maternal uncle, and mother; Heart attack in her paternal grandfather; Heart disease in her father; Lung cancer in her paternal aunt; Lung cancer (age of onset: 71) in her father; Other in her sister; Pancreatic cancer (age of onset: 34) in her brother; Ulcerative colitis in her brother and mother; Varicose Veins in her sister.  ROS:   Review of Systems  Constitution: Negative for decreased appetite, fever and weight gain.  HENT: Negative for congestion, ear discharge, hoarse voice and sore throat.   Eyes: Negative for discharge, redness, vision loss in right eye and visual halos.  Cardiovascular: Negative for chest pain, dyspnea on exertion, leg swelling, orthopnea and palpitations.  Respiratory: Negative for cough, hemoptysis, shortness of breath and snoring.   Endocrine: Negative for heat intolerance and polyphagia.  Hematologic/Lymphatic: Negative for bleeding problem. Does not bruise/bleed easily.  Skin: Negative for flushing, nail changes, rash and suspicious lesions.  Musculoskeletal: Negative for arthritis, joint pain, muscle cramps, myalgias, neck pain and stiffness.  Gastrointestinal: Negative for abdominal pain, bowel incontinence, diarrhea and excessive appetite.  Genitourinary: Negative for decreased libido, genital sores and incomplete emptying.   Neurological: Negative for brief paralysis, focal weakness, headaches and loss of balance.  Psychiatric/Behavioral: Negative for altered mental status, depression and suicidal ideas.  Allergic/Immunologic: Negative for HIV exposure and persistent infections.    EKGs/Labs/Other Studies Reviewed:    The following studies were reviewed today:   EKG:  The ekg ordered today demonstrates   Recent Labs: 03/08/2022: TSH  2.41 12/12/2022: ALT 14; BUN 18; Creatinine 0.67; Hemoglobin 15.1; Platelet Count 318; Potassium 4.7; Sodium 139  Recent Lipid Panel    Component Value Date/Time   CHOL 117 09/14/2022 0912   CHOL 119 02/15/2022 1038   TRIG 128.0 09/14/2022 0912   HDL 66.70 09/14/2022 0912   HDL 70 02/15/2022 1038   CHOLHDL 2 09/14/2022 0912   VLDL 25.6 09/14/2022 0912   LDLCALC 24 09/14/2022 0912   LDLCALC 19 02/15/2022 1038   LDLCALC 62 01/07/2020 0907   LDLDIRECT 110.0 12/13/2021 1359    Physical Exam:    VS:  BP 124/72 (BP Location: Right Arm, Patient Position: Sitting, Cuff Size: Normal)   Pulse 75   Ht 5' (1.524 m)   Wt 182 lb 3.2 oz (82.6 kg)   SpO2 96%   BMI 35.58 kg/m     Wt Readings from Last 3 Encounters:  02/21/23 182 lb 3.2 oz (82.6 kg)  02/12/23 183 lb (83 kg)  12/12/22 184 lb 4.8 oz (83.6 kg)     GEN: Well nourished, well developed in no acute distress HEENT: Normal NECK: No JVD; No carotid bruits LYMPHATICS: No lymphadenopathy CARDIAC: S1S2 noted,RRR, no murmurs, rubs, gallops RESPIRATORY:  Clear to auscultation without rales, wheezing or rhonchi  ABDOMEN: Soft, non-tender, non-distended, +bowel sounds, no guarding. EXTREMITIES: No edema, No cyanosis, no clubbing MUSCULOSKELETAL:  No deformity  SKIN: Warm and dry NEUROLOGIC:  Alert and oriented x 3, non-focal PSYCHIATRIC:  Normal affect, good insight  ASSESSMENT:    1. Obesity, Class II, BMI 35-39.9, isolated (see actual BMI)   2. Controlled type 2 diabetes mellitus without complication, without  long-term current use of insulin (HCC)   3. Encounter to establish care    PLAN:    Diabetes Mellitus A1c increased on last check in August 2024. Patient started on Rybelsus by PCP but no follow-up A1c check has been done. Patient expresses interest in lifestyle modification and weight loss. -Order Hemoglobin A1c today. -Refer to health coach for lifestyle modification guidance.  Coronary Artery Disease Patient is on Repatha, Zetia, Vascepa, No new complaints or symptoms reported. -Continue current medications. -Ensure patient has sufficient refills.  Hypertension - cont Ramipril   General Health Maintenance Patient expresses dissatisfaction with current PCP and interest in finding a new one. -Provide referral for new PCP as per patient's request.  The patient is in agreement with the above plan. The patient left the office in stable condition.  The patient will follow up in   Medication Adjustments/Labs and Tests Ordered: Current medicines are reviewed at length with the patient today.  Concerns regarding medicines are outlined above.  Orders Placed This Encounter  Procedures   Hemoglobin A1c   Referral to HRT/VAS Care Navigation   Ambulatory referral to [facility]   No orders of the defined types were placed in this encounter.   Patient Instructions  Medication Instructions:  Your physician recommends that you continue on your current medications as directed. Please refer to the Current Medication list given to you today.  *If you need a refill on your cardiac medications before your next appointment, please call your pharmacy*   Lab Work: HgbA1c If you have labs (blood work) drawn today and your tests are completely normal, you will receive your results only by: MyChart Message (if you have MyChart) OR A paper copy in the mail If you have any lab test that is abnormal or we need to change your treatment, we will call you to review  the results.  Follow-Up: At  [facility], you and your health needs are our priority.  As part of our continuing mission to provide you with exceptional heart care, we have created designated Provider Care Teams.  These Care Teams include your primary Cardiologist (physician) and Advanced Practice Providers (APPs -  Physician Assistants and Nurse Practitioners) who all work together to provide you with the care you need, when you need it.  Your next appointment:   1 year(s)  Provider:   Thomasene Ripple, DO    Other Instructions:      Adopting a Healthy Lifestyle.  Know what a healthy weight is for you (roughly BMI <25) and aim to maintain this   Aim for 7+ servings of fruits and vegetables daily   65-80+ fluid ounces of water or unsweet tea for healthy kidneys   Limit to max 1 drink of alcohol per day; avoid smoking/tobacco   Limit animal fats in diet for cholesterol and heart health - choose grass fed whenever available   Avoid highly processed foods, and foods high in saturated/trans fats   Aim for low stress - take time to unwind and care for your mental health   Aim for 150 min of moderate intensity exercise weekly for heart health, and weights twice weekly for bone health   Aim for 7-9 hours of sleep daily   When it comes to diets, agreement about the perfect plan isnt easy to find, even among the experts. Experts at the [facility] [employer] developed an idea known as the Healthy Eating Plate. Just imagine a plate divided into logical, healthy portions.   The emphasis is on diet quality:   Load up on vegetables and fruits - one-half of your plate: Aim for color and variety, and remember that potatoes dont count.   Go for whole grains - one-quarter of your plate: Whole wheat, barley, wheat berries, quinoa, oats, brown rice, and foods made with them. If you want pasta, go with whole wheat pasta.   Protein power - one-quarter of your plate: Fish, chicken, beans, and nuts are all  healthy, versatile protein sources. Limit red meat.   The diet, however, does go beyond the plate, offering a few other suggestions.   Use healthy plant oils, such as olive, canola, soy, corn, sunflower and peanut. Check the labels, and avoid partially hydrogenated oil, which have unhealthy trans fats.   If youre thirsty, drink water. Coffee and tea are good in moderation, but skip sugary drinks and limit milk and dairy products to one or two daily servings.   The type of carbohydrate in the diet is more important than the amount. Some sources of carbohydrates, such as vegetables, fruits, whole grains, and beans-are healthier than others.   Finally, stay active  Signed, Thomasene Ripple, DO  02/24/2023 7:53 AM    [facility]

## 2023-02-27 ENCOUNTER — Ambulatory Visit: Payer: Medicare Other | Attending: Internal Medicine

## 2023-02-27 ENCOUNTER — Encounter: Payer: Self-pay | Admitting: Certified Registered Nurse Anesthetist

## 2023-02-27 ENCOUNTER — Telehealth: Payer: Self-pay

## 2023-02-27 ENCOUNTER — Encounter: Payer: Self-pay | Admitting: Cardiology

## 2023-02-27 DIAGNOSIS — Z0000 Encounter for general adult medical examination without abnormal findings: Secondary | ICD-10-CM

## 2023-02-27 NOTE — Telephone Encounter (Signed)
 Called patient to determine if she was in route to her appointment. Patient was in the office and had just been marked present. Informed patient that I would come out once check-in arrived her. Patient verbally expressed understanding.    Greig Ruth, MS, ERHD, [facility]  Care Guide, Health & Wellness Coach [address], [address] [place] [address] Telephone: [phone] Email: [email or web address]

## 2023-02-28 ENCOUNTER — Telehealth: Payer: Self-pay | Admitting: Cardiology

## 2023-02-28 ENCOUNTER — Telehealth: Payer: Self-pay

## 2023-02-28 DIAGNOSIS — Z0000 Encounter for general adult medical examination without abnormal findings: Secondary | ICD-10-CM

## 2023-02-28 NOTE — Telephone Encounter (Signed)
 Bridgette Campus from [employer] [facility] called and confirmed that she received all faxed documents for the patient's referral. She also mentioned that she sent a message Dr. Cheril Cork, and once she receives a response, she will call the patient.

## 2023-02-28 NOTE — Telephone Encounter (Signed)
 Patient called in to reschedule health coaching appointment on 2/18 at 1:00pm to 2/17 at 3:00pm due to having another appt scheduled. Patient has been rescheduled as requested.    Greig Ruth, MS, ERHD, [facility]  Care Guide, Health & Wellness Coach [address], [address] [place] [address] Telephone: [phone] Email: [email or web address]

## 2023-02-28 NOTE — Progress Notes (Signed)
 HEALTH & WELLNESS COACHING INITIAL INTAKE   Appointment Outcome: Completed, Session #: Initial Start time: 11:20am   End time: 12:08pm   Total Mins: 48 minutes    What are the Patient's goals from Coaching? Patient wants to improve her healthy eating habits and start exercising to lose weight and lower her A1c from 7.2.   Why did they seek coaching now? Patient is having a challenging managing providing care for her husband and having the time to exercise, while she is experiencing her own physical challenges with her knee.  Patient eats on the go and have the challenge of needing to be in the right mood to want to cook at home.   Readiness - What stage is the patient in regarding their goal(s)?  Patient is in the contemplation stage of improving her healthy eating habits and engaging in exercise.     Coaching Progress Notes: Patient spends a lot of time providing care for her husband, a retired Cytogeneticist, and taking him to various appointments. Patient stated that since their day-to-day activities have changed, she hasn't had time to exercise at the [facility] that she joined.   Patient eats large bagels and 2 cups of coffee for breakfast. Patient consumes a lot of take out because she eats on the go. Patient stated eating out frequently is due to convenience, not planning meals, and not being motivated to cook at home.   Patient reports dealing with a lot of stress now and would like to figure out a convenient way to eat healthier.  Patient has experience with Weight Watchers and was successfully with weight loss.    Coaching Outcomes Patient expressed that she has been considering the following ideas to help her improve her eating habits.    AGREEMENTS SECTION   Overall Goal(s): Improve healthy eating habits to reduce A1c lower than 7.2 over the next 3 months.     Agreement/Action Steps:  Improve healthy eating habits Research healthy home meal delivery service  options     Patient review of Health Coaching Agreement Reviewed Coaching Agreement and Code of Ethics with Patient during initial session. Answered any questions the patient had if any regarding the Coaching Agreement and Code of Ethics. Patient verbally agreed to adhere to the Coaching Agreement and to abide by the Code of Ethics.  Mailed patient with a hard/electronic copy of the Coaching Agreement and Code of Ethics.    Referrals: N/A  Resources: N/A

## 2023-03-01 DIAGNOSIS — Z08 Encounter for follow-up examination after completed treatment for malignant neoplasm: Secondary | ICD-10-CM | POA: Diagnosis not present

## 2023-03-01 DIAGNOSIS — D0359 Melanoma in situ of other part of trunk: Secondary | ICD-10-CM | POA: Diagnosis not present

## 2023-03-01 DIAGNOSIS — Z85820 Personal history of malignant melanoma of skin: Secondary | ICD-10-CM | POA: Diagnosis not present

## 2023-03-01 DIAGNOSIS — Z1283 Encounter for screening for malignant neoplasm of skin: Secondary | ICD-10-CM | POA: Diagnosis not present

## 2023-03-01 DIAGNOSIS — D225 Melanocytic nevi of trunk: Secondary | ICD-10-CM | POA: Diagnosis not present

## 2023-03-05 ENCOUNTER — Encounter: Payer: Self-pay | Admitting: Cardiology

## 2023-03-05 ENCOUNTER — Telehealth: Payer: Self-pay | Admitting: Internal Medicine

## 2023-03-05 MED ORDER — ICOSAPENT ETHYL 1 G PO CAPS: 1.0000 g | ORAL_CAPSULE | Freq: Two times a day (BID) | ORAL | 3 refills | Status: DC

## 2023-03-05 NOTE — Telephone Encounter (Signed)
 Inbound call from patient's insurance stating Dr. Rosaline Coma is not in network with patient's insurance for colonoscopy tomorrow. Please advise, thank you.

## 2023-03-05 NOTE — Telephone Encounter (Signed)
 Patient is requesting a call back for message below.

## 2023-03-05 NOTE — Telephone Encounter (Signed)
 Inbound call from patient stating insurance has not received authorization. Requesting a call. Please advise, thank you.

## 2023-03-06 ENCOUNTER — Ambulatory Visit: Payer: Medicare Other | Admitting: Internal Medicine

## 2023-03-06 ENCOUNTER — Encounter: Payer: Self-pay | Admitting: Internal Medicine

## 2023-03-06 VITALS — BP 127/65 | HR 73 | Temp 98.3°F | Resp 10 | Ht 60.0 in | Wt 183.0 lb

## 2023-03-06 DIAGNOSIS — K648 Other hemorrhoids: Secondary | ICD-10-CM

## 2023-03-06 DIAGNOSIS — Z1211 Encounter for screening for malignant neoplasm of colon: Secondary | ICD-10-CM | POA: Diagnosis not present

## 2023-03-06 DIAGNOSIS — D120 Benign neoplasm of cecum: Secondary | ICD-10-CM | POA: Diagnosis not present

## 2023-03-06 DIAGNOSIS — K5730 Diverticulosis of large intestine without perforation or abscess without bleeding: Secondary | ICD-10-CM

## 2023-03-06 DIAGNOSIS — E119 Type 2 diabetes mellitus without complications: Secondary | ICD-10-CM | POA: Diagnosis not present

## 2023-03-06 DIAGNOSIS — K51919 Ulcerative colitis, unspecified with unspecified complications: Secondary | ICD-10-CM | POA: Diagnosis not present

## 2023-03-06 MED ORDER — SODIUM CHLORIDE 0.9 % IV SOLN: 500.0000 mL | INTRAVENOUS | Status: DC

## 2023-03-06 NOTE — Op Note (Signed)
[facility] Patient Name: Michelle Sherman Procedure Date: 03/06/2023 9:00 AM MRN: [id number] Endoscopist: Particia Lather , , [id number] Age: 73 Referring MD:  Date of Birth: [birth year] Gender: Female Account #: [id number] Procedure:                Colonoscopy Indications:              High risk colon cancer surveillance: Ulcerative                            pancolitis of 8 (or more) years duration Medicines:                Monitored Anesthesia Care Procedure:                Pre-Anesthesia Assessment:                           - Prior to the procedure, a History and Physical                            was performed, and patient medications and                            allergies were reviewed. The patient's tolerance of                            previous anesthesia was also reviewed. The risks                            and benefits of the procedure and the sedation                            options and risks were discussed with the patient.                            All questions were answered, and informed consent                            was obtained. Prior Anticoagulants: The patient has                            taken no anticoagulant or antiplatelet agents. ASA                            Grade Assessment: II - A patient with mild systemic                            disease. After reviewing the risks and benefits,                            the patient was deemed in satisfactory condition to                            undergo the procedure.  After obtaining informed consent, the colonoscope                            was passed under direct vision. Throughout the                            procedure, the patient's blood pressure, pulse, and                            oxygen saturations were monitored continuously. The                            Olympus Scope SN: [id number] was introduced through                            the anus and  advanced to the the terminal ileum.                            The colonoscopy was performed without difficulty.                            The patient tolerated the procedure well. The                            quality of the bowel preparation was excellent. The                            terminal ileum, ileocecal valve, appendiceal                            orifice, and rectum were photographed. Scope In: 9:09:16 AM Scope Out: 9:29:18 AM Scope Withdrawal Time: 0 hours 16 minutes 24 seconds  Total Procedure Duration: 0 hours 20 minutes 2 seconds  Findings:                 The terminal ileum appeared normal.                           A 4 mm polyp was found in the cecum. The polyp was                            sessile. The polyp was removed with a cold snare.                            Resection and retrieval were complete.                           Inflammation was not found based on the endoscopic                            appearance of the mucosa in the colon. This was                            graded as [employer]  Score 0 (normal or inactive                            disease). Four biopsies were taken every 10 cm with                            a cold forceps from the right colon, left colon and                            transverse colon for ulcerative colitis                            surveillance. These biopsy specimens from the right                            colon, left colon and transverse colon were sent to                            Pathology.                           Multiple diverticula were found in the sigmoid                            colon.                           Non-bleeding internal hemorrhoids were found during                            retroflexion. The hemorrhoids were severe. Complications:            No immediate complications. Estimated Blood Loss:     Estimated blood loss was minimal. Impression:               - The examined portion of the ileum was  normal.                           - One 4 mm polyp in the cecum, removed with a cold                            snare. Resected and retrieved.                           - Inactive ([employer]o Score 0) ulcerative colitis.                            Biopsied.                           - Diverticulosis in the sigmoid colon.                           - Non-bleeding internal hemorrhoids. Recommendation:           - Discharge patient to home (with escort).                           -  Await pathology results.                           - The findings and recommendations were discussed                            with the patient. Dr Particia Lather "Alan Ripper" Leonides Schanz,  03/06/2023 9:37:43 AM

## 2023-03-06 NOTE — Progress Notes (Signed)
GASTROENTEROLOGY PROCEDURE H&P NOTE   Primary Care Physician: Dana Allan, MD    Reason for Procedure:  Ulcerative colitis  Plan:    Colonoscopy  Patient is appropriate for endoscopic procedure(s) in the ambulatory ([facility]) setting.  The nature of the procedure, as well as the risks, benefits, and alternatives were carefully and thoroughly reviewed with the patient. Ample time for discussion and questions allowed. The patient understood, was satisfied, and agreed to proceed.     HPI: Michelle Sherman is a 73 y.o. female who presents for colonoscopy for ulcerative colitis. She does have about 3-4 Bms per day on average. She takes Lialda every day. Denies blood in the stools. Occasionally has scattered abdominal pains.   Colonoscopy 08/25/20: Indication was change in bowel habits. UC diagnosed in 2013. Multiple small and large mouthed diverticula found in the sigmoid colon. Normal TI. Repeat colonoscopy recommended in 2 years.  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 lfts in 100s, resolved with cessation of lipitor. Normal liver ultrasound   Elevated CK 08/15/2021   Family history of adverse reaction to anesthesia    sister, hard to wake up    Family history of lung cancer    Family history of pancreatic cancer    Family history of pancreatic cancer    Genetic  testing 10/06/2019   Negative genetic testing:  No pathogenic variants detected on the [facility] Breast Cancer STAT Panel + Common Hereditary Cancers Panel. 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 The report date is 10/05/2019.     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 I discussed option of draining seroma and ways to do this as well as risks.  I discussed that draining would likely make her breast less heavy feeling.  I reviewed that drainage would likely make the right breast smaller and the nipple point inferiorly.  I discussed that the seroma could recur and we would be in    Severe obesity (BMI 35.0-35.9 with comorbidity) (HCC) 11/01/2016   Sleep apnea    wears CPAP nightly   Thyroid disease    Ulcerative colitis [facility])     Past Surgical History:  Procedure Laterality Date  BREAST LUMPECTOMY Right 10/2019   BREAST LUMPECTOMY WITH RADIOACTIVE SEED AND SENTINEL LYMPH NODE BIOPSY Right 10/14/2019   Procedure: RIGHT BREAST LUMPECTOMY WITH RADIOACTIVE SEED AND SENTINEL LYMPH NODE BIOPSY;  Surgeon: Almond Lint, MD;  Location: [facility];  Service: General;  Laterality: Right;   CHOLECYSTECTOMY     RE-EXCISION OF BREAST LUMPECTOMY Right 11/11/2019   Procedure: RIGHT RE-EXCISION OF BREAST LUMPECTOMY;  Surgeon:  Almond Lint, MD;  Location: [facility];  Service: General;  Laterality: Right;  RNFA   SKIN SURGERY  12/2015   Fatty tumor removed   TONSILLECTOMY AND ADENOIDECTOMY  1956    Prior to Admission medications   Medication Sig Start Date End Date Taking? Authorizing Provider  aspirin EC 81 MG tablet Take 1 tablet by mouth daily.   Yes [provider]  Biotin 2500 MCG CAPS Take 1 capsule by mouth daily.   Yes [provider]  Cholecalciferol (VITAMIN D3) 2000 units capsule Take 1 capsule by mouth daily.   Yes [provider]  empagliflozin (JARDIANCE) 25 MG TABS tablet Take 1 tablet (25 mg total) by mouth daily before breakfast. 07/10/22  Yes Dana Allan, MD  exemestane (AROMASIN) 25 MG tablet Take 1 tablet (25 mg total) by mouth daily after breakfast. 09/26/22  Yes Pollyann Samples, NP  ezetimibe (ZETIA) 10 MG tablet Take 1 tablet (10 mg total) by mouth daily. 07/31/22  Yes Dana Allan, MD  famotidine (PEPCID) 40 MG tablet Take 40 mg by mouth 2 (two) times daily.   Yes [provider]  icosapent Ethyl (VASCEPA) 1 g capsule Take 1 capsule (1 g total) by mouth 2 (two) times daily. 03/05/23  Yes Tobb, Kardie, DO  levothyroxine (SYNTHROID) 112 MCG tablet Take 1 tablet (112 mcg total) by mouth daily. 09/11/22  Yes Dana Allan, MD  mesalamine (LIALDA) 1.2 g EC tablet Take by mouth. Take 2 tablet in am and 2 tablet in pm   Yes [provider]  metFORMIN (GLUCOPHAGE) 1000 MG tablet Take 1 tablet (1,000 mg total) by mouth 2 (two) times daily with a meal. 09/14/22  Yes Dana Allan, MD  Misc Natural Products (TURMERIC CURCUMIN) CAPS Take 1 capsule by mouth daily. 1000 mg   Yes [provider]  Multiple Vitamin (MULTIVITAMIN) tablet Take 1 tablet by mouth daily.   Yes [provider]  polyethylene glycol (GOLYTELY) 236 g solution See admin instructions. 10/26/22  Yes [provider]  ramipril (ALTACE) 10 MG capsule Take 1 capsule  (10 mg total) by mouth daily. 07/31/22  Yes Dana Allan, MD  Semaglutide (RYBELSUS) 3 MG TABS Take 1 tablet (3 mg total) by mouth daily. 10/31/22  Yes Dana Allan, MD  traZODone (DESYREL) 50 MG tablet Take 0.5-1 tablets (25-50 mg total) by mouth at bedtime as needed for sleep. 09/11/22  Yes Dana Allan, MD  Evolocumab (REPATHA SURECLICK) 140 MG/ML SOAJ Inject 140 mg into the skin every 14 (fourteen) days. 10/26/22   Sharlene Dory, PA-C  fexofenadine (ALLEGRA) 180 MG tablet Take 180 mg by mouth daily as needed.    [provider]  fluticasone (FLONASE) 50 MCG/ACT nasal spray Place 1 spray into both nostrils daily as needed. 01/09/14   [provider]    Current Outpatient Medications  Medication Sig Dispense Refill   aspirin EC 81 MG tablet Take 1 tablet by mouth daily.     Biotin 2500 MCG CAPS Take 1 capsule by mouth daily.  Cholecalciferol (VITAMIN D3) 2000 units capsule Take 1 capsule by mouth daily.     empagliflozin (JARDIANCE) 25 MG TABS tablet Take 1 tablet (25 mg total) by mouth daily before breakfast. 90 tablet 3   exemestane (AROMASIN) 25 MG tablet Take 1 tablet (25 mg total) by mouth daily after breakfast. 90 tablet 3   ezetimibe (ZETIA) 10 MG tablet Take 1 tablet (10 mg total) by mouth daily. 90 tablet 3   famotidine (PEPCID) 40 MG tablet Take 40 mg by mouth 2 (two) times daily.     icosapent Ethyl (VASCEPA) 1 g capsule Take 1 capsule (1 g total) by mouth 2 (two) times daily. 180 capsule 3   levothyroxine (SYNTHROID) 112 MCG tablet Take 1 tablet (112 mcg total) by mouth daily. 90 tablet 3   mesalamine (LIALDA) 1.2 g EC tablet Take by mouth. Take 2 tablet in am and 2 tablet in pm     metFORMIN (GLUCOPHAGE) 1000 MG tablet Take 1 tablet (1,000 mg total) by mouth 2 (two) times daily with a meal. 180 tablet 3   Misc Natural Products (TURMERIC CURCUMIN) CAPS Take 1 capsule by mouth daily. 1000 mg     Multiple Vitamin (MULTIVITAMIN) tablet Take 1 tablet by mouth daily.      polyethylene glycol (GOLYTELY) 236 g solution See admin instructions.     ramipril (ALTACE) 10 MG capsule Take 1 capsule (10 mg total) by mouth daily. 90 capsule 3   Semaglutide (RYBELSUS) 3 MG TABS Take 1 tablet (3 mg total) by mouth daily. 90 tablet 1   traZODone (DESYREL) 50 MG tablet Take 0.5-1 tablets (25-50 mg total) by mouth at bedtime as needed for sleep. 90 tablet 3   Evolocumab (REPATHA SURECLICK) 140 MG/ML SOAJ Inject 140 mg into the skin every 14 (fourteen) days. 6 mL 3   fexofenadine (ALLEGRA) 180 MG tablet Take 180 mg by mouth daily as needed.     fluticasone (FLONASE) 50 MCG/ACT nasal spray Place 1 spray into both nostrils daily as needed.     Current Facility-Administered Medications  Medication Dose Route Frequency Provider Last Rate Last Admin   0.9 %  sodium chloride infusion  500 mL Intravenous Continuous Imogene Burn, MD        Allergies as of 03/06/2023 - Review Complete 03/06/2023  Allergen Reaction Noted   Lipitor [atorvastatin] Other (See Comments) 01/07/2021   Nickel Other (See Comments) and Rash 01/14/2016   Sulfa antibiotics Rash 06/28/2010   Bydureon [exenatide] Nausea And Vomiting 01/07/2020    Family History  Problem Relation Age of Onset   Ulcerative colitis Mother    Diabetes Mother    Heart disease Father        mid 90s   Lung cancer Father 12       Lung   Varicose Veins Sister    Other Sister        "stiff heart"   Diabetes Brother    Pancreatic cancer Brother 58   Crohn's disease Brother    Ulcerative colitis Brother    Diabetes Brother    Colon cancer Brother 30 - 11   Diabetes Maternal Aunt    Diabetes Maternal Uncle    Lung cancer Paternal Aunt        smoker   Cancer Paternal Aunt        unknown type   Heart attack Paternal Grandfather    Esophageal cancer Neg Hx    Rectal cancer Neg Hx    Stomach cancer  Neg Hx     Social History   Socioeconomic History   Marital status: Married    Spouse name: Not on file   Number of  children: 1   Years of education: Not on file   Highest education level: Not on file  Occupational History   Occupation: Retired     Comment: Retail buyer   Tobacco Use   Smoking status: Never   Smokeless tobacco: Never  Vaping Use   Vaping status: Never Used  Substance and Sexual Activity   Alcohol use: Yes    Comment: occa   Drug use: No   Sexual activity: Yes    Birth control/protection: Post-menopausal  Other Topics Concern   Not on file  Social History Narrative   Married, cares for disabled husband,  no tob, Etoh or drug use; no exercise   Social Drivers of Corporate investment banker Strain: Low Risk  (07/13/2022)   Overall Financial Resource Strain (CARDIA)    Difficulty of Paying Living Expenses: Not hard at all  Food Insecurity: No Food Insecurity (07/13/2022)   Hunger Vital Sign    Worried About Running Out of Food in the Last Year: Never true    Ran Out of Food in the Last Year: Never true  Transportation Needs: No Transportation Needs (07/13/2022)   PRAPARE - Administrator, Civil Service (Medical): No    Lack of Transportation (Non-Medical): No  Physical Activity: Inactive (07/13/2022)   Exercise Vital Sign    Days of Exercise per Week: 0 days    Minutes of Exercise per Session: 0 min  Stress: No Stress Concern Present (07/13/2022)   [employer] of Occupational Health - Occupational Stress Questionnaire    Feeling of Stress : Not at all  Social Connections: Socially Integrated (07/13/2022)   Social Connection and Isolation Panel [NHANES]    Frequency of Communication with Friends and Family: More than three times a week    Frequency of Social Gatherings with Friends and Family: More than three times a week    Attends Religious Services: More than 4 times per year    Active Member of [employer] or Organizations: Yes    Attends Engineer, structural: More than 4 times per year    Marital Status: Married  Catering manager Violence: Not At Risk  (07/13/2022)   Humiliation, Afraid, Rape, and Kick questionnaire    Fear of Current or Ex-Partner: No    Emotionally Abused: No    Physically Abused: No    Sexually Abused: No    Physical Exam: Vital signs in last 24 hours: BP 130/71   Pulse 72   Temp 98.3 F (36.8 C) (Skin)   Ht 5' (1.524 m)   Wt 183 lb (83 kg)   SpO2 97%   BMI 35.74 kg/m  GEN: NAD EYE: Sclerae anicteric ENT: MMM CV: Non-tachycardic Pulm: No increased work of breathing GI: Soft, NT/ND NEURO:  Alert & Oriented   Eulah Pont, MD [facility]  03/06/2023 8:39 AM

## 2023-03-06 NOTE — Progress Notes (Signed)
Patient states there have been no changes to medical or surgical history since time of pre-visit.

## 2023-03-06 NOTE — Patient Instructions (Signed)
Resume previous diet Continue present medications Await pathology results  Handouts/information given for polyps, diverticulosis and hemorrhoids  YOU HAD AN ENDOSCOPIC PROCEDURE TODAY AT THE [facility]:   Refer to the procedure report that was given to you for any specific questions about what was found during the examination.  If the procedure report does not answer your questions, please call your gastroenterologist to clarify.  If you requested that your care partner not be given the details of your procedure findings, then the procedure report has been included in a sealed envelope for you to review at your convenience later.  YOU SHOULD EXPECT: Some feelings of bloating in the abdomen. Passage of more gas than usual.  Walking can help get rid of the air that was put into your GI tract during the procedure and reduce the bloating. If you had a lower endoscopy (such as a colonoscopy or flexible sigmoidoscopy) you may notice spotting of blood in your stool or on the toilet paper. If you underwent a bowel prep for your procedure, you may not have a normal bowel movement for a few days.  Please Note:  You might notice some irritation and congestion in your nose or some drainage.  This is from the oxygen used during your procedure.  There is no need for concern and it should clear up in a day or so.  SYMPTOMS TO REPORT IMMEDIATELY:  Following lower endoscopy (colonoscopy):  Excessive amounts of blood in the stool  Significant tenderness or worsening of abdominal pains  Swelling of the abdomen that is new, acute  Fever of 100F or higher  For urgent or emergent issues, a gastroenterologist can be reached at any hour by calling [phone]. Do not use MyChart messaging for urgent concerns.   DIET:  We do recommend a small meal at first, but then you may proceed to your regular diet.  Drink plenty of fluids but you should avoid alcoholic beverages for 24 hours.  ACTIVITY:  You  should plan to take it easy for the rest of today and you should NOT DRIVE or use heavy machinery until tomorrow (because of the sedation medicines used during the test).    FOLLOW UP: Our staff will call the number listed on your records the next business day following your procedure.  We will call around 7:15- 8:00 am to check on you and address any questions or concerns that you may have regarding the information given to you following your procedure. If we do not reach you, we will leave a message.     If any biopsies were taken you will be contacted by phone or by letter within the next 1-3 weeks.  Please call us at [phone] if you have not heard about the biopsies in 3 weeks.   SIGNATURES/CONFIDENTIALITY: You and/or your care partner have signed paperwork which will be entered into your electronic medical record.  These signatures attest to the fact that that the information above on your After Visit Summary has been reviewed and is understood.  Full responsibility of the confidentiality of this discharge information lies with you and/or your care-partner.

## 2023-03-06 NOTE — Progress Notes (Signed)
Pt's states no medical or surgical changes since previsit or office visit.

## 2023-03-06 NOTE — Progress Notes (Signed)
Report given to PACU, vss

## 2023-03-07 ENCOUNTER — Telehealth: Payer: Self-pay | Admitting: *Deleted

## 2023-03-07 NOTE — Telephone Encounter (Signed)
Attempted post procedure follow up call.  No answer - LVM.

## 2023-03-08 ENCOUNTER — Encounter: Payer: Self-pay | Admitting: Internal Medicine

## 2023-03-08 LAB — SURGICAL PATHOLOGY

## 2023-03-12 ENCOUNTER — Ambulatory Visit: Payer: Medicare Other | Attending: Cardiovascular Disease

## 2023-03-12 DIAGNOSIS — Z0000 Encounter for general adult medical examination without abnormal findings: Secondary | ICD-10-CM

## 2023-03-12 NOTE — Progress Notes (Signed)
 Appointment Outcome: Completed, Session #: 1                        Start time: 3:10pm   End time: 3:50pm   Total Mins: 40 minutes  AGREEMENTS SECTION   Overall Goal(s): Improve healthy eating habits to reduce A1c lower than 7.2 over the next 3 months.       Agreement/Action Steps:  Improve healthy eating habits Research healthy home meal delivery service options    Progress Notes:  Patient stated she did some cooking and prepared different soups. Patient has been trying to use up food at home. Patient stated that her mood must be in a particular place for her to be able to cook as well.  Patient can practice portion control 50% of the time. Patient stated that sometimes a takeout meal equates to two meals, and sometimes she eats more than one serving (e.g., bowl of soup) of a meal because she waited to eat.  Patient did research meal delivery options and found a place to pick up meals but not be delivered. Patient stated that she didn't look at the traditional services such as Materials engineer. Patient stated that she is planning on resuming her search.   Patient discussed how she prepares her salads that combines several food items. Patient is concerned how healthy the salads are that she makes for lunch.   Patient eats large bagels and one egg or bacon for breakfast. Patient expressed concerned about her carbohydrate consumption.  Patient measures out her coffee creamer. Trying to avoid artificial sweetener.    Indicators of Success and Accountability:  Patient reported that her A1c decreased to a 7.4 from 7.8 per last lab results.  Readiness: Patient is in the action stage of improving healthy eating behaviors. Patient is in the preparation stage of increasing physical activity.  Strengths and Supports: Patient is being supported by patient is relying on prior knowledge/experience with healthy eating to lose weight.   Challenges and Barriers: Patient eats based on  mood and convenience.   Coaching Outcomes: Patient will focus on improving her healthy eating habits over the next two weeks by implementing the following action steps outlined below.   Patient is considering various options to help her re-engage with exercising such as an ellipse, or group exercising with friends at the [facility]. Will discuss further in next health coaching session.    Overall Goal(s): Improve healthy eating habits to reduce consumption of carbohydrates to lower A1c over the next three months.      Agreement/Action Steps:  Improve healthy eating habits Research healthy home meal delivery service options Read food label to monitor carbohydrate consumption 50-130 gram of carbohydrates per day or 45-60% of daily calories Practice portion control by using smaller plate and bowl Pack healthy snacks to take when out for long periods of time Eat  bagel with an egg for breakfast    Attempted: Fulfilled - Patient researched various meal delivery options since the last visit. Patient continued to read food labels to monitor carbohydrate consumption   Referrals: N/A

## 2023-03-13 ENCOUNTER — Ambulatory Visit: Payer: Self-pay

## 2023-03-16 ENCOUNTER — Encounter: Payer: Self-pay | Admitting: Family Medicine

## 2023-03-16 ENCOUNTER — Telehealth: Payer: Self-pay | Admitting: Cardiology

## 2023-03-16 ENCOUNTER — Ambulatory Visit: Payer: Medicare Other | Admitting: Family Medicine

## 2023-03-16 VITALS — BP 136/82 | HR 85 | Temp 98.2°F | Ht 60.0 in | Wt 182.2 lb

## 2023-03-16 DIAGNOSIS — E039 Hypothyroidism, unspecified: Secondary | ICD-10-CM

## 2023-03-16 DIAGNOSIS — E1169 Type 2 diabetes mellitus with other specified complication: Secondary | ICD-10-CM

## 2023-03-16 DIAGNOSIS — D0359 Melanoma in situ of other part of trunk: Secondary | ICD-10-CM | POA: Diagnosis not present

## 2023-03-16 DIAGNOSIS — E118 Type 2 diabetes mellitus with unspecified complications: Secondary | ICD-10-CM | POA: Diagnosis not present

## 2023-03-16 DIAGNOSIS — E785 Hyperlipidemia, unspecified: Secondary | ICD-10-CM | POA: Diagnosis not present

## 2023-03-16 DIAGNOSIS — E1159 Type 2 diabetes mellitus with other circulatory complications: Secondary | ICD-10-CM

## 2023-03-16 DIAGNOSIS — Z7984 Long term (current) use of oral hypoglycemic drugs: Secondary | ICD-10-CM

## 2023-03-16 DIAGNOSIS — I152 Hypertension secondary to endocrine disorders: Secondary | ICD-10-CM

## 2023-03-16 MED ORDER — RYBELSUS 7 MG PO TABS: 7.0000 mg | ORAL_TABLET | Freq: Every day | ORAL | 3 refills | Status: DC

## 2023-03-16 NOTE — Telephone Encounter (Signed)
Patient calling stating that Dr. Maxcine Ham office (dermatology) is requesting icosapent Ethyl (VASCEPA) 1 g capsule and aspirin be held for two weeks. Procedure to remove a section of skin is 03/07 in exactly two weeks, so she is to stop today with meds. That office is closed today at 1 p.m. and pt is wanting to know if she can go ahead and stop for this weekend and have a clearance request sent in Monday from that office.  Transferred to [place], CMA.

## 2023-03-16 NOTE — Progress Notes (Signed)
Subjective:     Patient ID: Michelle Sherman, female   DOB: 1950-12-02, 73 y.o.   MRN: [id number]  HPI  Patient is a very pleasant 72 year old Caucasian female here today to establish care.  She has a history of type 2 diabetes mellitus currently managed with Rybelsus 3 mg a day, metformin, Jardiance.  He denies any nausea or vomiting or Rybelsus.  Show medication for some month.  She also has a history of nonobstructive coronary artery disease.  She has a less than 50% stenosis in the LAD.  She is currently on aspirin and Repatha as well as Zetia.  She also has a history of hypothyroidism for which she takes levothyroxine.  She was diagnosed with breast cancer in 2021 in the right breast.  Lymph nodes were clear.  However she is on a estrogen blocker.  She also has osteoarthritis in her right knee.  Colonoscopy was performed this year and due to the number of polyps they recommended a repeat colonoscopy in 3 years.  The patient's mammogram was performed every August.  Her bone density was performed last year and is being repeated every 2 years.  Her immunizations are up-to-date except for the COVID-vaccine RSV shot. 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 lfts in 100s, resolved with cessation of lipitor. Normal liver ultrasound   Elevated CK 08/15/2021   Family history of adverse  reaction to anesthesia    sister, hard to wake up    Family history of lung cancer    Family history of pancreatic cancer    Family history of pancreatic cancer    Genetic testing 10/06/2019   Negative genetic testing:  No pathogenic variants detected on the [facility] Breast Cancer STAT Panel + Common Hereditary Cancers Panel. 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 The report date is 10/05/2019.     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 I discussed option of draining seroma and ways to do this as well as risks.  I discussed that draining would likely make her breast less heavy feeling.  I reviewed that drainage would likely make the right breast smaller and the nipple point inferiorly.  I discussed that the  seroma could recur and we would be in    Severe obesity (BMI 35.0-35.9 with comorbidity) (HCC) 11/01/2016   Sleep apnea    wears CPAP nightly   Thyroid disease    Ulcerative colitis [facility])    Past Surgical History:  Procedure Laterality Date   BREAST LUMPECTOMY Right 10/2019   BREAST LUMPECTOMY WITH RADIOACTIVE SEED AND SENTINEL LYMPH NODE BIOPSY Right 10/14/2019   Procedure: RIGHT BREAST LUMPECTOMY WITH RADIOACTIVE SEED AND SENTINEL LYMPH NODE BIOPSY;  Surgeon: Almond Lint, MD;  Location: [facility];   Service: General;  Laterality: Right;   CHOLECYSTECTOMY     RE-EXCISION OF BREAST LUMPECTOMY Right 11/11/2019   Procedure: RIGHT RE-EXCISION OF BREAST LUMPECTOMY;  Surgeon: Almond Lint, MD;  Location: [facility];  Service: General;  Laterality: Right;  RNFA   SKIN SURGERY  12/2015   Fatty tumor removed   TONSILLECTOMY AND ADENOIDECTOMY  1956   Current Outpatient Medications on File Prior to Visit  Medication Sig Dispense Refill   aspirin EC 81 MG tablet Take 1 tablet by mouth daily.     Biotin 2500 MCG CAPS Take 1 capsule by mouth daily.     Cholecalciferol (VITAMIN D3) 2000 units capsule Take 1 capsule by mouth daily.     empagliflozin (JARDIANCE) 25 MG TABS tablet Take 1 tablet (25 mg total) by mouth daily before breakfast. 90 tablet 3   Evolocumab (REPATHA SURECLICK) 140 MG/ML SOAJ Inject 140 mg into the skin every 14 (fourteen) days. 6 mL 3   exemestane (AROMASIN) 25 MG tablet Take 1 tablet (25 mg total) by mouth daily after breakfast. 90 tablet 3   ezetimibe (ZETIA) 10 MG tablet Take 1 tablet (10 mg total) by mouth daily. 90 tablet 3   famotidine (PEPCID) 40 MG tablet Take 40 mg by mouth 2 (two) times daily.     fexofenadine (ALLEGRA) 180 MG tablet Take 180 mg by mouth daily as needed.     fluticasone (FLONASE) 50 MCG/ACT nasal spray Place 1 spray into both nostrils daily as needed.     icosapent Ethyl (VASCEPA) 1 g capsule Take 1 capsule (1 g total) by mouth 2 (two) times daily. 180 capsule 3   levothyroxine (SYNTHROID) 112 MCG tablet Take 1 tablet (112 mcg total) by mouth daily. 90 tablet 3   mesalamine (LIALDA) 1.2 g EC tablet Take by mouth. Take 2 tablet in am and 2 tablet in pm     metFORMIN (GLUCOPHAGE) 1000 MG tablet Take 1 tablet (1,000 mg total) by mouth 2 (two) times daily with a meal. 180 tablet 3   Misc Natural Products (TURMERIC CURCUMIN) CAPS Take 1 capsule by mouth daily. 1000 mg     Multiple Vitamin (MULTIVITAMIN) tablet Take 1 tablet by mouth daily.      ramipril (ALTACE) 10 MG capsule Take 1 capsule (10 mg total) by mouth daily. 90 capsule 3   Semaglutide (RYBELSUS) 3 MG TABS Take 1 tablet (3 mg total) by mouth daily. 90 tablet 1   traZODone (DESYREL) 50 MG tablet Take 0.5-1 tablets (25-50 mg total) by mouth at bedtime as needed for sleep. 90 tablet 3   No current facility-administered medications on file prior to visit.   Allergies  Allergen Reactions   Lipitor [Atorvastatin] Other (See Comments)    Hepatitis and myopathy, lfts 100s and elevated CK with symptoms   Nickel Other (See Comments) and Rash    drainage   Sulfa Antibiotics Rash   Bydureon [Exenatide] Nausea  And Vomiting   Social History   Socioeconomic History   Marital status: Married    Spouse name: Not on file   Number of children: 1   Years of education: Not on file   Highest education level: Not on file  Occupational History   Occupation: Retired     Comment: Retail buyer   Tobacco Use   Smoking status: Never   Smokeless tobacco: Never  Vaping Use   Vaping status: Never Used  Substance and Sexual Activity   Alcohol use: Yes    Comment: occa   Drug use: No   Sexual activity: Yes    Birth control/protection: Post-menopausal  Other Topics Concern   Not on file  Social History Narrative   Married, cares for disabled husband,  no tob, Etoh or drug use; no exercise   Social Drivers of Corporate investment banker Strain: Low Risk  (07/13/2022)   Overall Financial Resource Strain (CARDIA)    Difficulty of Paying Living Expenses: Not hard at all  Food Insecurity: No Food Insecurity (07/13/2022)   Hunger Vital Sign    Worried About Running Out of Food in the Last Year: Never true    Ran Out of Food in the Last Year: Never true  Transportation Needs: No Transportation Needs (07/13/2022)   PRAPARE - Administrator, Civil Service (Medical): No    Lack of Transportation (Non-Medical): No  Physical Activity: Inactive (07/13/2022)   Exercise Vital Sign     Days of Exercise per Week: 0 days    Minutes of Exercise per Session: 0 min  Stress: No Stress Concern Present (07/13/2022)   [employer] of Occupational Health - Occupational Stress Questionnaire    Feeling of Stress : Not at all  Social Connections: Socially Integrated (07/13/2022)   Social Connection and Isolation Panel [NHANES]    Frequency of Communication with Friends and Family: More than three times a week    Frequency of Social Gatherings with Friends and Family: More than three times a week    Attends Religious Services: More than 4 times per year    Active Member of [employer] or Organizations: Yes    Attends Banker Meetings: More than 4 times per year    Marital Status: Married  Catering manager Violence: Not At Risk (07/13/2022)   Humiliation, Afraid, Rape, and Kick questionnaire    Fear of Current or Ex-Partner: No    Emotionally Abused: No    Physically Abused: No    Sexually Abused: No     Review of Systems  All other systems reviewed and are negative.      Objective:   Physical Exam Constitutional:      Appearance: Normal appearance. She is obese.  Cardiovascular:     Rate and Rhythm: Normal rate and regular rhythm.     Heart sounds: Normal heart sounds. No murmur heard.    No friction rub. No gallop.  Pulmonary:     Effort: Pulmonary effort is normal.     Breath sounds: Normal breath sounds.  Neurological:     Mental Status: She is alert.        Assessment:    Type 2 diabetes mellitus with complications (HCC) - Plan: CBC with Differential/Platelet, COMPLETE METABOLIC PANEL WITH GFR, Lipid panel, Hemoglobin A1c, Microalbumin/Creatinine Ratio, Urine  Hypertension associated with diabetes (HCC)  Hyperlipidemia associated with type 2 diabetes mellitus (HCC)  Hypothyroidism, unspecified type     Plan:     Mammogram,  colonoscopy are up-to-date.  Immunizations are up-to-date.  Increase Rybelsus to 7 mg a day and recheck in 3 months.   That time I would like to get a CBC a CMP lipid panel hemoglobin A1c a TSH and a urine protein creatinine ratio.  Goal A1c is less than 6 point.  I will likely hide radial collateral less than 55 and I will keep microalbumin to creatinine ratio less than 30.  Spent the majority of her 30-minute visit and discussion.

## 2023-03-16 NOTE — Telephone Encounter (Signed)
Called and spoke to patient made them aware of preop APP provider recommendations I made patient aware of our preop clearance process and called left a vm to requesting office that we will need clearance forward to our office first thing Monday morning.

## 2023-03-19 ENCOUNTER — Telehealth: Payer: Self-pay | Admitting: *Deleted

## 2023-03-19 ENCOUNTER — Ambulatory Visit: Payer: Medicare PPO | Admitting: Family Medicine

## 2023-03-19 NOTE — Telephone Encounter (Signed)
   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 Etter Sjogren Surgeon's Group or Practice Name:  [facility] Phone number:  [phone] Fax number:  [phone]   Type of Clearance Requested:   - Medical  - Pharmacy:  Hold Aspirin and Vascepa  two weeks prior to procedure   Type of Anesthesia:  Not Indicated   Additional requests/questions:    Signed, Emmit Pomfret   03/19/2023, 12:05 PM

## 2023-03-19 NOTE — Telephone Encounter (Signed)
 Good Morning Dr. Servando Salina  We have received a surgical clearance request for Michelle Sherman for the melanoma excision in situ. They were seen recently in clinic on 02/21/2023. Can you please comment on surgical clearance for upcoming melanoma procedure.. Please forward you guidance and recommendations to [facility] PREOP   Thank you, Robin Searing, NP

## 2023-03-20 NOTE — Telephone Encounter (Signed)
   Patient Name: Michelle Sherman  DOB: 05-May-1950 MRN: [id number]  Primary Cardiologist: Thomasene Ripple, DO  Chart reviewed as part of pre-operative protocol coverage. Given past medical history and time since last visit, based on ACC/AHA guidelines, Delonna B Topel is at acceptable risk for the planned procedure without further cardiovascular testing.   The patient was advised that if she develops new symptoms prior to surgery to contact our office to arrange for a follow-up visit, and she verbalized understanding.  Patient can hold Vascepa and ASA 2 weeks prior to procedure as requested.    I will route this recommendation to the requesting party via Epic fax function and remove from pre-op pool.  Please call with questions.  Napoleon Form, Leodis Rains, NP 03/20/2023, 8:24 AM

## 2023-03-26 ENCOUNTER — Telehealth: Payer: Self-pay

## 2023-03-26 ENCOUNTER — Ambulatory Visit: Payer: Medicare Other | Attending: Cardiology

## 2023-03-26 DIAGNOSIS — Z0000 Encounter for general adult medical examination without abnormal findings: Secondary | ICD-10-CM

## 2023-03-26 NOTE — Progress Notes (Unsigned)
 Appointment Outcome: Completed, Session #: 31-month f/u                       Start time:  11:00am   End time: 11:33am   Total Mins: 33 minutes  AGREEMENTS SECTION   Overall Goal(s): Smoking cessation Increase physical activity Improve eating habits   Agreement/Action Steps: Smoking Cessation Maintain not smoking after finished last 7mg  nicotine patch Utilize support system Use Quit Tracker app   Increase physical activity Exercise on Tues and Thurs for 15-30 mins Aim for a minimum of 8,000 steps during his work and 4,000 steps on off days   Improving eating habits Swap sweet snacks after meals with fruit (apples, grapes, oranges, or tangelos) Research healthy breakfast meals Increase protein intake on exercise days (Tues and Thurs) Drink overnight oats with 20g of protein per serving    Progress Notes:  Patient reported that he has been smoke free for 144 days per his quit tracker app and finished his nicotine patches one month ago. Patient stated that he has been doing well and do not think about smoking unless he is drinking his morning coffee. Patient stated that once the urge to smoke begins, he begins watching tv to distract himself. Patient mentioned that he has not dreamed of himself smoking since the last dream he had.  Patient shared that it does not trigger him to want to smoke when he sees others smoking. Patient mentioned that he does not get an urge to smoke even when he starts feeling upset. Patient expressed that smoking does not cross his mind as it would have previously to help him calm down. Patient stated that his support system has been helpful in encouraging him through the process of smoking cessation.   Patient expressed that he is drinking more water and eating in place of smoking. Patient shared that he continues to take his healthy choice meals to work in addition to fruits such as watermelon, raspberries, strawberries, and bananas. Patient stated there are  times that he still has a challenge with eating sweets. Patient mentioned that one day he ate fruit and apple pie during lunch. Patient expressed that he should not have eaten the pie.   Patient stated that he consumes the overnight oats every other day although he has not been following his exercise schedule. Patient stated that his weight has increased from 170 lbs. to 189 lbs. and feels that it is time that he loses weight. Patient shared that his goal weight is 175 lbs. Patient expressed that he believes it's the types of foods that he has been consuming and not the quantity of food that has led to the weight gain. Patient mentioned that he typically gets breakfast (chicken sandwich with no fries or soda) from [employer] at least twice a week on the way to work but feels that he needs to get out of the routine of eating fast food. Patient stated that when he does eat breakfast at home he enjoys grits, oatmeal, and waffles.  Discussed with patient regarding balanced meals. Discussed with patient about reading food labels to monitor his sodium intake since he eats prepackaged meals regularly and to aid in choosing lower sugar containing foods. Discussed with the idea of practicing portion control with sweets by reading food labels to determine the recommended serving size. Patient stated that he wasn't sure how to reduce his sugar consumption but following the recommended serving size was something that he could start doing.  Patient reported his daily steps while at work and on his off days. Patient has been averaging between 8,600 and 12,000 steps while at work depending on how busy he becomes. Patient is averaging around 5,000 steps on his off days by walking in stores, engaging in housework, or helping his sister with things at her house. Patient stated that he has been stretching every other day before work in place of exercising over the past month.   Patient mentioned that he purchased a treadmill  because he enjoys walking to help with losing weight in addition to a pushup board. Patient stated that he currently completes 30 pushups daily before work. Patient stated that he has a room that he is working on turning into a workout space that will include his weight bench in addition to the other equipment he has purchased. Discussed with patient an ideal workout schedule that he would be able to implement and maintain.   Indicators of Success and Accountability:  Patient has been able to maintain not smoking after completing his prescription of nicotine patches a month ago.  Readiness: Patient is in the action stage of smoking cessation, increasing physical activity, and improving healthy eating habits.  Strengths and Supports: Patient is being supported by his family. Patient is relying on being adaptable and persistent.   Challenges and Barriers: Patient does not foresee any challenges to maintain smoking cessation over the next two months or implementing action steps to increase physical activity and improve healthy eating habits.    Coaching Outcomes: Patient will implement the following strategies over the next two months as outlined below based on the plans that the patient has begun or want to start implementing.    Overall Goal(s): Smoking cessation Increase physical activity Improve eating habits   Agreement/Action Steps: Smoking Cessation Maintain not smoking Utilize support system Use Quit Tracker app   Increase physical activity Exercise for 1 hour after work 2-3 days per week Work out from 12:45-1:45am on T and Th (and Wed if possible) Set up home gym with treadmill, weight bench, and pushup board Aim for a minimum of 8,000 steps during his work and 4,000 steps on off days   Improving eating habits Reduce the amount of sugar consumed daily Swap sweet snacks after meals with fruit Read food labels to eat the recommended serving size Increase protein intake on  exercise days (Tues and Thurs) Drink overnight oats with 20g of protein per serving    Attempted: Fulfilled - Patient was able to maintain not smoking after completing the prescription for nicotine patches over the past month by continuing to utilize his support system and quit tracker app. Patient continues to consume overnight oats to increase his protein intake.  Partial - Patient has been able to reach his step goal of 8,000 steps during workdays and 4,000 steps on off days by engaging in more walking and other physical activities.  Not met - Patient had a challenge with swapping out sweet snacks after meals with fruit.    Not attempted: Revised - Patient determined that he would like to change his exercise routine to lose weight by working out after work for 1 hour between 12:45-1:45am for 2-3 days/week with exercise equipment.

## 2023-03-26 NOTE — Telephone Encounter (Signed)
 Called patient to determine if she was in route to her health coaching appt. Patient did not answer. Left message for patient to call back with update ETA or reschedule if necessary.   Renaee Munda, MS, ERHD, [facility]  Care Guide, Health & Wellness Coach [address] [place] [address] Telephone: [phone] Email: [email or web address]

## 2023-03-30 ENCOUNTER — Other Ambulatory Visit: Payer: Self-pay | Admitting: Plastic Surgery

## 2023-03-30 DIAGNOSIS — D0359 Melanoma in situ of other part of trunk: Secondary | ICD-10-CM | POA: Diagnosis not present

## 2023-04-03 LAB — DERMATOLOGY PATHOLOGY

## 2023-04-09 ENCOUNTER — Ambulatory Visit: Attending: Cardiology

## 2023-04-09 DIAGNOSIS — Z0000 Encounter for general adult medical examination without abnormal findings: Secondary | ICD-10-CM

## 2023-04-09 NOTE — Progress Notes (Unsigned)
 Appointment Outcome: Completed, Session #: 3                         Start time: 3:07pm    End time: 3:32pm   Total Mins:25 minutes  AGREEMENTS SECTION   Overall Goal(s): Improve healthy eating habits to reduce consumption of carbohydrates to lower A1c over the next three months.     Agreement/Action Steps:  Improve healthy eating habits Make healthy meal choices at restaurants that are not high in carbohydrates Read food label to monitor carbohydrate consumption 50-130 gram of carbohydrates per day or 45-60% of daily calories Practice portion control by using smaller plate and bowl Pack healthy snacks to take when out for long periods of time Eat bagels every other day     Progress Notes:  Patient reported not being able to complete her health coaching agreement after having her procedure due to her restrictions about lifting and straining. Patient shared that she was being cautious not to cause injury to herself, so she mainly had takeout. Patient provided an example of trying to make a healthier choice at a fast-food restaurant by substituting fries for coleslaw. Patient stated she did prepare some meals over the past two weeks that were quick like hamburgers on rye bread and fries cooked in the air fryer.  Patient shared that she continues to practice portion control when eating from a restaurant by eating portion of the meal for lunch and the other portion for dinner. Patient mentioned that she has been eating breakfast such as grits with cheese, eggs, toast, or cereal. Patient mention that she has not had a challenge with skipping the bagel every other day because she recognized that when she does not have protein for breakfast, she become hungry quicker.   Patient shared that she found herself eating/snacking more because she was not active as she normally is when providing care for her husband, running errands, and housework. Patient shared that she is working to get back on with  her eating now that she is not scared to move as she were before. Patient shared that she would resume cooking today and will have salmon.    Coaching Outcomes: Patient awareness of her eating habits have increased, and she stated that she has to stay mindful of eating when she is not busy or bored.   Patient stated that she is okay with not eating as much as she normally does and leaving some food on the plate to reduce how much she is eating.   Discussed with patient how to make healthier choices by looking at menus ahead of time to plan meals from restaurants to avoid last minute choices.  Patient does not want to count carbohydrates, just monitor carbs by reading food labels.       Attempted: Partial - Patient was able to meet part of her health coaching agreement by practicing portion control at times, eating bagels every other day, and reading food labels to monitor carbs.    Referrals: N/A  Resources: N/A

## 2023-04-24 ENCOUNTER — Ambulatory Visit: Attending: Cardiology

## 2023-04-24 DIAGNOSIS — Z0000 Encounter for general adult medical examination without abnormal findings: Secondary | ICD-10-CM

## 2023-04-24 NOTE — Progress Notes (Unsigned)
 Appointment Outcome: Completed, Session #: 31-month f/u                       Start time:  11:00am   End time: 11:33am   Total Mins: 33 minutes  AGREEMENTS SECTION   Overall Goal(s): Smoking cessation Increase physical activity Improve eating habits   Agreement/Action Steps: Smoking Cessation Maintain not smoking after finished last 7mg  nicotine patch Utilize support system Use Quit Tracker app   Increase physical activity Exercise on Tues and Thurs for 15-30 mins Aim for a minimum of 8,000 steps during his work and 4,000 steps on off days   Improving eating habits Swap sweet snacks after meals with fruit (apples, grapes, oranges, or tangelos) Research healthy breakfast meals Increase protein intake on exercise days (Tues and Thurs) Drink overnight oats with 20g of protein per serving    Progress Notes:  Patient reported that he has been smoke free for 144 days per his quit tracker app and finished his nicotine patches one month ago. Patient stated that he has been doing well and do not think about smoking unless he is drinking his morning coffee. Patient stated that once the urge to smoke begins, he begins watching tv to distract himself. Patient mentioned that he has not dreamed of himself smoking since the last dream he had.  Patient shared that it does not trigger him to want to smoke when he sees others smoking. Patient mentioned that he does not get an urge to smoke even when he starts feeling upset. Patient expressed that smoking does not cross his mind as it would have previously to help him calm down. Patient stated that his support system has been helpful in encouraging him through the process of smoking cessation.   Patient expressed that he is drinking more water and eating in place of smoking. Patient shared that he continues to take his healthy choice meals to work in addition to fruits such as watermelon, raspberries, strawberries, and bananas. Patient stated there are  times that he still has a challenge with eating sweets. Patient mentioned that one day he ate fruit and apple pie during lunch. Patient expressed that he should not have eaten the pie.   Patient stated that he consumes the overnight oats every other day although he has not been following his exercise schedule. Patient stated that his weight has increased from 170 lbs. to 189 lbs. and feels that it is time that he loses weight. Patient shared that his goal weight is 175 lbs. Patient expressed that he believes it's the types of foods that he has been consuming and not the quantity of food that has led to the weight gain. Patient mentioned that he typically gets breakfast (chicken sandwich with no fries or soda) from [employer] at least twice a week on the way to work but feels that he needs to get out of the routine of eating fast food. Patient stated that when he does eat breakfast at home he enjoys grits, oatmeal, and waffles.  Discussed with patient regarding balanced meals. Discussed with patient about reading food labels to monitor his sodium intake since he eats prepackaged meals regularly and to aid in choosing lower sugar containing foods. Discussed with the idea of practicing portion control with sweets by reading food labels to determine the recommended serving size. Patient stated that he wasn't sure how to reduce his sugar consumption but following the recommended serving size was something that he could start doing.  Patient reported his daily steps while at work and on his off days. Patient has been averaging between 8,600 and 12,000 steps while at work depending on how busy he becomes. Patient is averaging around 5,000 steps on his off days by walking in stores, engaging in housework, or helping his sister with things at her house. Patient stated that he has been stretching every other day before work in place of exercising over the past month.   Patient mentioned that he purchased a treadmill  because he enjoys walking to help with losing weight in addition to a pushup board. Patient stated that he currently completes 30 pushups daily before work. Patient stated that he has a room that he is working on turning into a workout space that will include his weight bench in addition to the other equipment he has purchased. Discussed with patient an ideal workout schedule that he would be able to implement and maintain.   Indicators of Success and Accountability:  Patient has been able to maintain not smoking after completing his prescription of nicotine patches a month ago.  Readiness: Patient is in the action stage of smoking cessation, increasing physical activity, and improving healthy eating habits.  Strengths and Supports: Patient is being supported by his family. Patient is relying on being adaptable and persistent.   Challenges and Barriers: Patient does not foresee any challenges to maintain smoking cessation over the next two months or implementing action steps to increase physical activity and improve healthy eating habits.    Coaching Outcomes: Patient will implement the following strategies over the next two months as outlined below based on the plans that the patient has begun or want to start implementing.    Overall Goal(s): Smoking cessation Increase physical activity Improve eating habits   Agreement/Action Steps: Smoking Cessation Maintain not smoking Utilize support system Use Quit Tracker app   Increase physical activity Exercise for 1 hour after work 2-3 days per week Work out from 12:45-1:45am on T and Th (and Wed if possible) Set up home gym with treadmill, weight bench, and pushup board Aim for a minimum of 8,000 steps during his work and 4,000 steps on off days   Improving eating habits Reduce the amount of sugar consumed daily Swap sweet snacks after meals with fruit Read food labels to eat the recommended serving size Increase protein intake on  exercise days (Tues and Thurs) Drink overnight oats with 20g of protein per serving    Attempted: Fulfilled - Patient was able to maintain not smoking after completing the prescription for nicotine patches over the past month by continuing to utilize his support system and quit tracker app. Patient continues to consume overnight oats to increase his protein intake.  Partial - Patient has been able to reach his step goal of 8,000 steps during workdays and 4,000 steps on off days by engaging in more walking and other physical activities.  Not met - Patient had a challenge with swapping out sweet snacks after meals with fruit.    Not attempted: Revised - Patient determined that he would like to change his exercise routine to lose weight by working out after work for 1 hour between 12:45-1:45am for 2-3 days/week with exercise equipment.

## 2023-05-08 ENCOUNTER — Ambulatory Visit: Payer: Self-pay

## 2023-05-08 ENCOUNTER — Telehealth: Payer: Self-pay

## 2023-05-08 ENCOUNTER — Telehealth: Payer: Self-pay | Admitting: Licensed Clinical Social Worker

## 2023-05-08 NOTE — Telephone Encounter (Signed)
 CSW contacted patient to inform that Michelle Sherman is no longer available to provide Health Coaching. Appointment cancelled for next week. Patient states that she does not any further resources at this time. Aubry Blase, LCSW, CCSW-MCS [phone]

## 2023-05-08 NOTE — Telephone Encounter (Signed)
 Copied from CRM [id number]. Topic: Clinical - Medication Question >> May 08, 2023  2:38 PM Marissa P wrote: Reason for CRM:  Has questions about possibly discounting the medication for some time for the surgery next week.

## 2023-05-11 ENCOUNTER — Ambulatory Visit

## 2023-05-15 DIAGNOSIS — H25811 Combined forms of age-related cataract, right eye: Secondary | ICD-10-CM | POA: Diagnosis not present

## 2023-05-15 DIAGNOSIS — H2511 Age-related nuclear cataract, right eye: Secondary | ICD-10-CM | POA: Diagnosis not present

## 2023-05-18 ENCOUNTER — Other Ambulatory Visit: Payer: Self-pay | Admitting: Family Medicine

## 2023-05-18 DIAGNOSIS — Z9889 Other specified postprocedural states: Secondary | ICD-10-CM

## 2023-05-28 DIAGNOSIS — D0359 Melanoma in situ of other part of trunk: Secondary | ICD-10-CM | POA: Diagnosis not present

## 2023-06-04 ENCOUNTER — Ambulatory Visit: Payer: Medicare Other | Attending: General Surgery

## 2023-06-04 VITALS — Wt 183.1 lb

## 2023-06-04 DIAGNOSIS — Z483 Aftercare following surgery for neoplasm: Secondary | ICD-10-CM | POA: Insufficient documentation

## 2023-06-04 NOTE — Therapy (Signed)
 OUTPATIENT PHYSICAL THERAPY SOZO SCREENING NOTE   Patient Name: Michelle Sherman MRN: [id number] DOB:1950-05-03, 73 y.o., female Today's Date: 06/04/2023  PCP: Austine Lefort, MD REFERRING PROVIDER: Lockie Rima, MD   PT End of Session - 06/04/23 1518     Visit Number 5   # unchanged due to screen only   PT Start Time 1516    PT Stop Time 1520    PT Time Calculation (min) 4 min    Activity Tolerance Patient tolerated treatment well    Behavior During Therapy [facility] for tasks assessed/performed             Past Medical History:  Diagnosis Date   Acquired trigger finger of left middle finger 04/11/2021   Acquired trigger finger of right index finger 04/11/2021   Acquired trigger finger of right middle finger 04/11/2021   Allergy    ANA positive 08/16/2021   Arthritis    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 lfts in 100s, resolved with cessation of lipitor. Normal liver ultrasound   Elevated CK 08/15/2021   Family history of adverse reaction to anesthesia    sister, hard to wake up    Family history of lung cancer    Family history of pancreatic cancer    Family history of pancreatic cancer    Genetic testing 10/06/2019   Negative genetic testing:  No pathogenic variants detected on the [facility] Breast Cancer STAT Panel + Common Hereditary Cancers Panel. 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 The report date is 10/05/2019.     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 I discussed option of draining seroma and ways to do this as well as risks.  I discussed that draining would likely make her breast less heavy feeling.  I reviewed that drainage would likely make the right breast smaller and the nipple point inferiorly.  I discussed that the seroma could recur and we would be in    Severe obesity (BMI 35.0-35.9 with comorbidity) (HCC) 11/01/2016   Sleep apnea    wears CPAP nightly   Thyroid  disease    Ulcerative colitis [facility])    Past Surgical History:  Procedure Laterality Date   BREAST LUMPECTOMY Right 10/2019   BREAST LUMPECTOMY WITH RADIOACTIVE SEED AND SENTINEL LYMPH NODE BIOPSY Right 10/14/2019   Procedure: RIGHT BREAST LUMPECTOMY WITH RADIOACTIVE SEED AND SENTINEL LYMPH NODE BIOPSY;  Surgeon: Lockie Rima, MD;  Location: [facility];  Service: General;  Laterality:  Right;   CHOLECYSTECTOMY     RE-EXCISION OF BREAST LUMPECTOMY Right 11/11/2019   Procedure: RIGHT RE-EXCISION OF BREAST LUMPECTOMY;  Surgeon: Lockie Rima, MD;  Location: [facility];  Service: General;  Laterality: Right;  RNFA   SKIN SURGERY  12/2015   Fatty tumor removed   TONSILLECTOMY AND ADENOIDECTOMY  1956   Patient Active Problem List   Diagnosis Date Noted   Breast cancer (HCC) 09/30/2022   Type 2 diabetes mellitus with complications (HCC) 03/18/2022   Need for tetanus booster 03/18/2022   Annual physical exam 03/18/2022   Obesity,  Class II, BMI 35-39.9, isolated (see actual BMI) 12/31/2021   Rash 12/31/2021   Bilateral carpal tunnel syndrome 04/12/2021   Hand arthritis 01/27/2021   Arthritis of hand 01/27/2021   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 She had a right lumpectomy and sentinel node biopsy (2 negative nodes) on 10/14/2019. It is ER/PR positive and HER2 negative with a Ki67 of 5%. She cares for her husband who is in a motorized wheelchair and has bilateral BKA.   PRECAUTIONS: right UE Lymphedema risk, None  SUBJECTIVE: Pt returns for her 6 month L-Dex screen.  PAIN:  Are you having pain? No  SOZO SCREENING: Patient was assessed today using the SOZO machine to determine the lymphedema index score. This was compared to her baseline score. It was determined that she is within the recommended range when compared to her baseline and no further action is needed at this time. She will continue SOZO screenings. These are done every 3 months for 2 years post operatively followed by every 6 months for 2 years, and then annually.      L-DEX FLOWSHEETS - 06/04/23 1500       L-DEX LYMPHEDEMA SCREENING   Measurement Type Unilateral    L-DEX MEASUREMENT EXTREMITY Upper Extremity    POSITION  Standing    DOMINANT SIDE Right    At Risk Side Right    BASELINE SCORE (UNILATERAL) -2.2    L-DEX SCORE (UNILATERAL) -1    VALUE CHANGE (UNILAT) 1.2             P: Cont 6 month screens x 1 more then transition to annual.   Denyce Flank, PTA 06/04/2023, 3:18 PM

## 2023-06-08 DIAGNOSIS — C4361 Malignant melanoma of right upper limb, including shoulder: Secondary | ICD-10-CM | POA: Diagnosis not present

## 2023-06-14 ENCOUNTER — Other Ambulatory Visit: Payer: Medicare PPO

## 2023-06-14 ENCOUNTER — Ambulatory Visit: Payer: Medicare Other | Admitting: Family Medicine

## 2023-06-14 ENCOUNTER — Ambulatory Visit: Payer: Medicare PPO | Admitting: Nurse Practitioner

## 2023-06-14 DIAGNOSIS — Z1283 Encounter for screening for malignant neoplasm of skin: Secondary | ICD-10-CM | POA: Diagnosis not present

## 2023-06-14 DIAGNOSIS — Z08 Encounter for follow-up examination after completed treatment for malignant neoplasm: Secondary | ICD-10-CM | POA: Diagnosis not present

## 2023-06-14 DIAGNOSIS — Z85820 Personal history of malignant melanoma of skin: Secondary | ICD-10-CM | POA: Diagnosis not present

## 2023-06-14 DIAGNOSIS — D225 Melanocytic nevi of trunk: Secondary | ICD-10-CM | POA: Diagnosis not present

## 2023-06-15 ENCOUNTER — Ambulatory Visit (INDEPENDENT_AMBULATORY_CARE_PROVIDER_SITE_OTHER): Payer: Medicare Other | Admitting: Family Medicine

## 2023-06-15 ENCOUNTER — Encounter: Payer: Self-pay | Admitting: Family Medicine

## 2023-06-15 VITALS — BP 128/68 | HR 81 | Temp 98.4°F | Ht 60.0 in | Wt 183.2 lb

## 2023-06-15 DIAGNOSIS — Z7984 Long term (current) use of oral hypoglycemic drugs: Secondary | ICD-10-CM | POA: Diagnosis not present

## 2023-06-15 DIAGNOSIS — W57XXXA Bitten or stung by nonvenomous insect and other nonvenomous arthropods, initial encounter: Secondary | ICD-10-CM

## 2023-06-15 DIAGNOSIS — E118 Type 2 diabetes mellitus with unspecified complications: Secondary | ICD-10-CM

## 2023-06-15 DIAGNOSIS — E039 Hypothyroidism, unspecified: Secondary | ICD-10-CM | POA: Diagnosis not present

## 2023-06-15 DIAGNOSIS — M7591 Shoulder lesion, unspecified, right shoulder: Secondary | ICD-10-CM

## 2023-06-15 NOTE — Progress Notes (Signed)
 Subjective:     Patient ID: Michelle Sherman, female   DOB: August 09, 1950, 73 y.o.   MRN: [id number]  HPI  Patient is a very pleasant 73 year old Caucasian female here today for follow-up.  She is currently on Rybelsus  7 mg a day.  She does report feeling constipated and bloated and nauseous after taking the medication.  She denies any abdominal pain or chest pain or shortness of breath.  She is due for fasting lab work.  Of note, she also complains of severe pain in her right shoulder.  Passively she is able to abduct her arm to 180 degrees.  However she has severe pain and weakness with empty can testing.  She also has pain with O'Brien's maneuver.  I believe that the patient likely has an irritation or even possible tear in the supraspinatus tendon.  Is been gone now several months and gradually getting worse.  We discussed her options and she is interested in physical therapy. 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 lfts in 100s, resolved with cessation of lipitor. Normal liver ultrasound   Elevated CK 08/15/2021   Family history of adverse reaction to anesthesia    sister, hard to wake up    Family history of lung cancer    Family history of pancreatic cancer    Family history of pancreatic cancer    Genetic testing 10/06/2019   Negative genetic testing:  No pathogenic  variants detected on the [facility] Breast Cancer STAT Panel + Common Hereditary Cancers Panel. 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 The report date is 10/05/2019.     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 I discussed option of draining seroma and ways to do this as well as risks.  I discussed that draining would likely make her breast less heavy feeling.  I reviewed that drainage would likely make the right breast smaller and the nipple point inferiorly.  I discussed that the seroma could recur and we would be in    Severe obesity (BMI 35.0-35.9 with comorbidity) (HCC) 11/01/2016   Sleep apnea    wears CPAP nightly   Thyroid  disease    Ulcerative colitis (HCC)  Past Surgical History:  Procedure Laterality Date   BREAST LUMPECTOMY Right 10/2019   BREAST LUMPECTOMY WITH RADIOACTIVE SEED AND SENTINEL LYMPH NODE BIOPSY Right 10/14/2019   Procedure: RIGHT BREAST LUMPECTOMY WITH RADIOACTIVE SEED AND SENTINEL LYMPH NODE BIOPSY;  Surgeon: Lockie Rima, MD;  Location: [facility];  Service: General;  Laterality: Right;   CHOLECYSTECTOMY     RE-EXCISION OF BREAST LUMPECTOMY Right 11/11/2019   Procedure: RIGHT RE-EXCISION OF BREAST LUMPECTOMY;  Surgeon: Lockie Rima, MD;  Location: [facility];  Service:  General;  Laterality: Right;  RNFA   SKIN SURGERY  12/2015   Fatty tumor removed   TONSILLECTOMY AND ADENOIDECTOMY  1956   Current Outpatient Medications on File Prior to Visit  Medication Sig Dispense Refill   aspirin EC 81 MG tablet Take 1 tablet by mouth daily.     Biotin 2500 MCG CAPS Take 1 capsule by mouth daily.     Cholecalciferol (VITAMIN D3) 2000 units capsule Take 1 capsule by mouth daily.     empagliflozin  (JARDIANCE ) 25 MG TABS tablet Take 1 tablet (25 mg total) by mouth daily before breakfast. 90 tablet 3   Evolocumab  (REPATHA  SURECLICK) 140 MG/ML SOAJ Inject 140 mg into the skin every 14 (fourteen) days. 6 mL 3   exemestane  (AROMASIN ) 25 MG tablet Take 1 tablet (25 mg total) by mouth daily after breakfast. 90 tablet 3   ezetimibe  (ZETIA ) 10 MG tablet Take 1 tablet (10 mg total) by mouth daily. 90 tablet 3   famotidine (PEPCID) 40 MG tablet Take 40 mg by mouth 2 (two) times daily.     fexofenadine (ALLEGRA) 180 MG tablet Take 180 mg by mouth daily as needed.     fluticasone (FLONASE) 50 MCG/ACT nasal spray Place 1 spray into both nostrils daily as needed.     icosapent  Ethyl (VASCEPA ) 1 g capsule Take 1 capsule (1 g total) by mouth 2 (two) times daily. 180 capsule 3   levothyroxine  (SYNTHROID ) 112 MCG tablet Take 1 tablet (112 mcg total) by mouth daily. 90 tablet 3   mesalamine (LIALDA) 1.2 g EC tablet Take by mouth. Take 2 tablet in am and 2 tablet in pm     metFORMIN  (GLUCOPHAGE ) 1000 MG tablet Take 1 tablet (1,000 mg total) by mouth 2 (two) times daily with a meal. 180 tablet 3   Misc Natural Products (TURMERIC CURCUMIN) CAPS Take 1 capsule by mouth daily. 1000 mg     Multiple Vitamin (MULTIVITAMIN) tablet Take 1 tablet by mouth daily.     ramipril  (ALTACE ) 10 MG capsule Take 1 capsule (10 mg total) by mouth daily. 90 capsule 3   Semaglutide  (RYBELSUS ) 7 MG TABS Take 1 tablet (7 mg total) by mouth daily. 90 tablet 3   traZODone  (DESYREL ) 50 MG tablet Take 0.5-1 tablets  (25-50 mg total) by mouth at bedtime as needed for sleep. 90 tablet 3   No current facility-administered medications on file prior to visit.   Allergies  Allergen Reactions   Lipitor [Atorvastatin ] Other (See Comments)    Hepatitis and myopathy, lfts 100s and elevated CK with symptoms   Nickel Other (See Comments) and Rash    drainage   Sulfa Antibiotics Rash   Bydureon  [Exenatide ] Nausea And Vomiting   Social History   Socioeconomic History   Marital status: Married    Spouse name: Not on file   Number of children: 1   Years of education: Not on file   Highest education level:  Not on file  Occupational History   Occupation: Retired     Comment: Retail buyer   Tobacco Use   Smoking status: Never   Smokeless tobacco: Never  Vaping Use   Vaping status: Never Used  Substance and Sexual Activity   Alcohol use: Yes    Comment: occa   Drug use: No   Sexual activity: Yes    Birth control/protection: Post-menopausal  Other Topics Concern   Not on file  Social History Narrative   Married, cares for disabled husband,  no tob, Etoh or drug use; no exercise   Social Drivers of Corporate investment banker Strain: Low Risk  (06/08/2023)   Received from [facility]   Overall Financial Resource Strain (CARDIA)    Difficulty of Paying Living Expenses: Not hard at all  Food Insecurity: No Food Insecurity (06/08/2023)   Received from [facility]   Hunger Vital Sign    Worried About Running Out of Food in the Last Year: Never true    Ran Out of Food in the Last Year: Never true  Transportation Needs: No Transportation Needs (06/08/2023)   Received from [facility]   PRAPARE - Transportation    Lack of Transportation (Medical): No    Lack of Transportation (Non-Medical): No  Physical Activity: Sufficiently Active (06/08/2023)   Received from [facility]   Exercise Vital Sign    Days of Exercise per Week: 3 days    Minutes of Exercise per Session: 60 min  Stress: No  Stress Concern Present (06/08/2023)   Received from [facility] of Occupational Health - Occupational Stress Questionnaire    Feeling of Stress : Only a little  Social Connections: Socially Integrated (06/08/2023)   Received from [facility]   Social Connection and Isolation Panel [NHANES]    Frequency of Communication with Friends and Family: Three times a week    Frequency of Social Gatherings with Friends and Family: Three times a week    Attends Religious Services: More than 4 times per year    Active Member of Clubs or Organizations: Yes    Attends Banker Meetings: More than 4 times per year    Marital Status: Married  Catering manager Violence: Not At Risk (06/08/2023)   Received from [facility]   Humiliation, Afraid, Rape, and Kick questionnaire    Fear of Current or Ex-Partner: No    Emotionally Abused: No    Physically Abused: No    Sexually Abused: No     Review of Systems  All other systems reviewed and are negative.      Objective:   Physical Exam Constitutional:      Appearance: Normal appearance. She is obese.  Cardiovascular:     Rate and Rhythm: Normal rate and regular rhythm.     Heart sounds: Normal heart sounds. No murmur heard.    No friction rub. No gallop.  Pulmonary:     Effort: Pulmonary effort is normal.     Breath sounds: Normal breath sounds.  Musculoskeletal:     Right shoulder: Tenderness present. No crepitus. Decreased range of motion. Decreased strength.  Neurological:     Mental Status: She is alert.        Assessment:    Right supraspinatus tendinitis - Plan: Ambulatory referral to Physical Therapy  Type 2 diabetes mellitus with complications (HCC) - Plan: CBC with Differential/Platelet, Comprehensive metabolic panel with GFR, Lipid panel, Hemoglobin  A1c, Microalbumin/Creatinine Ratio, Urine  Hypothyroidism, unspecified type - Plan: TSH  Tick bite, unspecified site, initial encounter -  Plan: Lyme Disease Antibody (IgG), Immunoblot     Plan:     I will check a hemoglobin A1c.  I would like the patient's A1c less than 6.5.  Also check a fasting lipid panel.  Goal LDL cholesterol is less than 55 given her history of coronary artery disease.  Check an albumin to creatinine ratio to ensure that there is no evidence of diabetic nephropathy.  Check a TSH to ensure adequate dosage of her levothyroxine .  We saucerized her shoulder pain including presenting to the shoulder, for therapy, or MRI of the right shoulder.  Patient would like to try therapy first.  She also reports a tick bite with severe fatigue for several weeks after the tick bite.  I will screen the patient for Lyme disease.  Patient will try holding the Rybelsus  to see if the nausea improves.  If so we may want to switch to Ozempic to see if she can tolerate this form of the medication better

## 2023-06-18 ENCOUNTER — Ambulatory Visit: Payer: Self-pay | Admitting: Family Medicine

## 2023-06-18 ENCOUNTER — Other Ambulatory Visit: Payer: Self-pay | Admitting: Nurse Practitioner

## 2023-06-18 DIAGNOSIS — C50411 Malignant neoplasm of upper-outer quadrant of right female breast: Secondary | ICD-10-CM

## 2023-06-18 NOTE — Assessment & Plan Note (Signed)
 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 -S/p right lumpectomy with Dr Cherlynn Cornfield on 10/14/19 and right breast re-excision on 11/11/19 due to positive margins. Oncotype showed RS 0, low risk. -she completed adjuvant Radiation 12/22/19-01/19/20 -Se began adjuvant anastrozole  in 01/2020. She is tolerating well overall, except rare hot flash and moderate joint pain mainly in her hands today.  This is likely multifactorial, but also related to AI.  -Due to increasing joint pain from anastrozole  and arthritis, we decided together to hold anastrozole  for 2-4 weeks to see how pain responds.  -Bilateral diagnostic mammogram done 09/04/2022 with benign results. -DEXA scan done 10/05/2022 showing normal bone density. -started exemestane  09/26/2022.  -Continue surveillance

## 2023-06-18 NOTE — Progress Notes (Unsigned)
 Patient Care Team: Austine Lefort, MD as PCP - General (Family Medicine) Tobb, Kardie, DO as PCP - Cardiology (Cardiology) Faustina Hood, MD as Consulting Physician (Pulmonary Disease) Tami Falcon, MD as Consulting Physician (Gastroenterology) Terrilee Few, PT as Physical Therapist (Physical Therapy) Charity Conch, DPM as Consulting Physician (Podiatry) Alvina Axon, MD as Consulting Physician (Ophthalmology) Alane Hsu, RN as Oncology Nurse Navigator Auther Bo, RN as Oncology Nurse Navigator Lockie Rima, MD as Consulting Physician (General Surgery) Sonja [place], MD as Consulting Physician (Hematology) Johna Myers, MD as Consulting Physician (Radiation Oncology) Burton, Lacie K, NP as Nurse Practitioner (Nurse Practitioner) Rolando Cliche, [facility] as Pharmacist (Pharmacist) Verlyn Goad, MD as Consulting Physician (Obstetrics and Gynecology) Hamilton Levin (Inactive) (Cardiology)  Clinic Day:  06/19/2023  Referring physician: Valli Gaw, MD  ASSESSMENT & PLAN:   Assessment & Plan: Malignant neoplasm of upper-outer quadrant of right breast in female, 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 -S/p right lumpectomy with Dr Cherlynn Cornfield on 10/14/19 and right breast re-excision on 11/11/19 due to positive margins. Oncotype showed RS 0, low risk. -she completed adjuvant Radiation 12/22/19-01/19/20 -Se began adjuvant anastrozole  in 01/2020. She is tolerating well overall, except rare hot flash and moderate joint pain mainly in her hands today.  This is likely multifactorial, but also related to AI.  -Due to increasing joint pain from anastrozole  and arthritis, we decided together to hold anastrozole  for 2-4 weeks to see how pain responds.  -Bilateral diagnostic mammogram done 09/04/2022 with benign results. -DEXA scan done 09/04/2022 showing  normal bone density. -started exemestane  09/26/2022.  -06/19/2023 - tolerating exemestane  much better than anastrozole  with mild and manageable side effects.  -3D diagnostic mammogram scheduled for 09/02/2023.  -continue exemestane  daily.  -labs and follow up in 6 months, sooner if needed     Side effects from AI medication Patient changed from anastrozole  to exemestane .  Continues to have some joint pain, hot flashes, and night sweats.  States these are much more manageable with exemestane  than she was experiencing on anastrozole .  Continue exemestane  daily.  Refill as needed.  Type 2 diabetes Blood sugars slightly elevated today at 163.  She was on Rybelsus  and this was causing her constipation and abdominal bloating.  Was uncomfortable.  She saw her primary care on Friday, 06/15/2023.  She was asked to hold this medication to see if this would help.  She is already noticing an improvement in abdominal symptoms and constipation.  Plan: Labs reviewed.  -CBC and CMP both unremarkable.  Mammogram from 09/04/2022 was benign Scheduled for next mammogram on 09/02/2023.  Continue exemestane  daily  Labs and follow up in 6 months, sooner if needed.     The patient understands the plans discussed today and is in agreement with them.  She knows to contact our office if she develops concerns prior to her next appointment.  I provided 25 minutes of face-to-face time during this encounter and > 50% was spent counseling as documented under my assessment and plan.    Sharyon Deis, NP  [facility] - A DEPT OF [facility] [address] [place] [address] Dept: [phone] Dept Fax: [phone]   No orders of the defined types were placed in this encounter.     CHIEF COMPLAINT:  CC: right breast cancer, estrogen receptor  positive.   Current Treatment:  exemestane  daily tarting 09/2022.   INTERVAL HISTORY:  Michelle Sherman is here  today for repeat clinical assessment. She was last seen by me on 12/12/2022. She is scheduled for 3D diagnostic mammogram 09/02/2023.  States that side effects from exemestane  are more manageable than those on anastrozole .  She does have sensation that right breast feels heavier than the left.  She denies any changes, lumps, or masses in either breast.  Denies nipple inversion or nipple discharge.  She denies chest pain, chest pressure, or shortness of breath. She denies headaches or visual disturbances. She denies abdominal pain, nausea, vomiting, or changes in bowel or bladder habits.  She denies fevers or chills. She denies pain. Her appetite is good. Her weight has been stable.  I have reviewed the past medical history, past surgical history, social history and family history with the patient and they are unchanged from previous note.  ALLERGIES:  is allergic to lipitor [atorvastatin ], nickel, sulfa antibiotics, and bydureon  [exenatide ].  MEDICATIONS:  Current Outpatient Medications  Medication Sig Dispense Refill   aspirin EC 81 MG tablet Take 1 tablet by mouth daily.     Biotin 2500 MCG CAPS Take 1 capsule by mouth daily.     Cholecalciferol (VITAMIN D3) 2000 units capsule Take 1 capsule by mouth daily.     empagliflozin  (JARDIANCE ) 25 MG TABS tablet Take 1 tablet (25 mg total) by mouth daily before breakfast. 90 tablet 3   Evolocumab  (REPATHA  SURECLICK) 140 MG/ML SOAJ Inject 140 mg into the skin every 14 (fourteen) days. 6 mL 3   exemestane  (AROMASIN ) 25 MG tablet Take 1 tablet (25 mg total) by mouth daily after breakfast. 90 tablet 3   ezetimibe  (ZETIA ) 10 MG tablet Take 1 tablet (10 mg total) by mouth daily. 90 tablet 3   famotidine (PEPCID) 40 MG tablet Take 40 mg by mouth 2 (two) times daily.     fexofenadine (ALLEGRA) 180 MG tablet Take 180 mg by mouth daily as needed.     fluticasone (FLONASE) 50 MCG/ACT nasal spray Place 1 spray into both nostrils daily as needed.     icosapent  Ethyl  (VASCEPA ) 1 g capsule Take 1 capsule (1 g total) by mouth 2 (two) times daily. 180 capsule 3   levothyroxine  (SYNTHROID ) 112 MCG tablet Take 1 tablet (112 mcg total) by mouth daily. 90 tablet 3   mesalamine (LIALDA) 1.2 g EC tablet Take by mouth. Take 2 tablet in am and 2 tablet in pm     metFORMIN  (GLUCOPHAGE ) 1000 MG tablet Take 1 tablet (1,000 mg total) by mouth 2 (two) times daily with a meal. 180 tablet 3   Misc Natural Products (TURMERIC CURCUMIN) CAPS Take 1 capsule by mouth daily. 1000 mg     Multiple Vitamin (MULTIVITAMIN) tablet Take 1 tablet by mouth daily.     ramipril  (ALTACE ) 10 MG capsule Take 1 capsule (10 mg total) by mouth daily. 90 capsule 3   tirzepatide (MOUNJARO) 5 MG/0.5ML Pen Inject 5 mg into the skin once a week. 2 mL 0   traZODone  (DESYREL ) 50 MG tablet Take 0.5-1 tablets (25-50 mg total) by mouth at bedtime as needed for sleep. 90 tablet 3   No current facility-administered medications for this visit.    HISTORY OF PRESENT ILLNESS:   Oncology History Overview Note  Cancer Staging Malignant neoplasm of upper-outer quadrant of right breast in female, estrogen receptor positive (HCC) Staging form: Breast, AJCC 8th Edition - Clinical stage from  09/12/2019: Stage IA (cT1b, cN0, cM0, G1, ER+, PR+, HER2-) - Signed by Sonja [place], MD on 09/23/2019    Malignant neoplasm of upper-outer quadrant of right breast in female, estrogen receptor positive (HCC) (Resolved)  09/05/2019 Mammogram   IMPRESSION: Indeterminate 9 x 6 x 6 mm mass involving the OUTER RIGHT breast at the 9 o'clock position approximately 5 cm from the nipple at POSTERIOR depth, located anterior to a normal appearing intramammary lymph Node.      09/12/2019 Cancer Staging   Staging form: Breast, AJCC 8th Edition - Clinical stage from 09/12/2019: Stage IA (cT1b, cN0, cM0, G1, ER+, PR+, HER2-) - Signed by Sonja [place], MD on 09/23/2019   09/12/2019 Initial Biopsy   Diagnosis 1. Breast, right, needle core biopsy,  9 o'clock posterior - INVASIVE DUCTAL CARCINOMA WITH PAPILLARY FEATURES. 2. Breast, right, needle core biopsy, 9 o'clock anterior - INVASIVE DUCTAL CARCINOMA WITH PAPILLARY FEATURES. Microscopic Comment 1. - 2. The specimens share similar morphologic features. E-cadherin is positive and CK5/6 is negative. P63, Calponin and SMM-1 demonstrate the absence of myoepithelium. Ancillary studies will be reported separately. Results reported to [facility] on 09/15/2019. Intradepartmental consultation (Dr. Secundino Dach).   09/12/2019 Receptors her2   1. PROGNOSTIC INDICATORS Results: IMMUNOHISTOCHEMICAL AND MORPHOMETRIC ANALYSIS PERFORMED MANUALLY The tumor cells are NEGATIVE for Her2 (1+). Estrogen Receptor: 99%, POSITIVE, STRONG STAINING INTENSITY Progesterone Receptor: 99%, POSITIVE, STRONG STAINING INTENSITY Proliferation Marker Ki67: 5%   09/18/2019 Initial Diagnosis   Malignant neoplasm of upper-outer quadrant of right breast in female, estrogen receptor positive (HCC)   10/05/2019 Genetic Testing   Negative genetic testing:  No pathogenic variants detected on the [facility] Breast Cancer STAT Panel + Common Hereditary Cancers Panel. 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 The report date is 10/05/2019.  The Breast Cancer STAT Panel offered by [facility] includes sequencing and deletion/duplication analysis for the following 9 genes:  ATM, BRCA1, BRCA2, CDH1, CHEK2, PALB2, PTEN, STK11 and TP53. The Common Hereditary Cancers Panel offered by [facility] includes sequencing and/or deletion duplication testing of the following 48 genes: APC, ATM, AXIN2, BARD1, BMPR1A, BRCA1, BRCA2, BRIP1, CDH1, CDK4, CDKN2A (p14ARF), CDKN2A (p16INK4a), CHEK2, CTNNA1, DICER1, EPCAM (Deletion/duplication testing only), GREM1 (promoter region deletion/duplication testing only), KIT, MEN1, MLH1, MSH2, MSH3, MSH6, MUTYH, NBN, NF1, NTHL1, PALB2, PDGFRA, PMS2, POLD1, POLE,  PTEN, RAD50, RAD51C, RAD51D, RNF43, SDHB, SDHC, SDHD, SMAD4, SMARCA4. STK11, TP53, TSC1, TSC2, and VHL.  The following genes were evaluated for sequence changes only: SDHA and HOXB13 c.251G>A variant only.    10/14/2019 Pathology Results   RIGHT BREAST LUMPECTOMY WITH RADIOACTIVE SEED AND SENTINEL LYMPH NODE BIOPSY by Dr Cherlynn Cornfield    FINAL MICROSCOPIC DIAGNOSIS:   A. BREAST, RIGHT, LUMPECTOMY:  - Multifocal invasive ductal carcinoma with papillary features, grade 2,  spanning 0.9 cm and 0.5 cm.  - Intermediate grade ductal carcinoma in situ.  - Invasive carcinoma present at original inferior margin broadly, final  inferior margin (Part F) is negative.  - Margins are negative for in situ carcinoma.  - Biopsy site.  - See oncology table.   B. BREAST, RIGHT ADDITIONAL POSTERIOR MARGIN, EXCISION:  - Benign breast tissue.   C. BREAST, RIGHT ADDITIONAL LATERAL MARGIN, EXCISION:  - Benign breast tissue.   D. BREAST, RIGHT ADDITIONAL SUPERIOR MARGIN, EXCISION:  - Benign breast tissue.   E. BREAST, RIGHT ADDITIONAL MEDIAL MARGIN, EXCISION:  - Invasive ductal carcinoma with papillary features, grade 2, spanning  0.5 cm.  -  Invasive carcinoma is present at the new medial margin broadly.   F. BREAST, RIGHT ADDITIONAL INFERIOR MARGIN, EXCISION:  - Benign breast tissue.   G. LYMPH NODE, RIGHT AXILLARY #1, SENTINEL, EXCISION:  - One of one lymph nodes negative for carcinoma (0/1).   H. LYMPH NODE, RIGHT AXILLARY #2, SENTINEL, EXCISION:  - One of one lymph nodes negative for carcinoma (0/1).   10/14/2019 Oncotype testing   Oncotype  Recurrence Score 0 with distant recurrence risk at 9 years of 3%.  There is less then 1% benefit of chemotherapy   10/14/2019 Cancer Staging   Staging form: Breast, AJCC 8th Edition - Pathologic stage from 10/14/2019: Stage IA (pT1b, pN0, cM0, G2, ER+, PR+, HER2-, Oncotype DX score: 0) - Signed by Sonja Chub[place], MD on 01/29/2020   11/11/2019 Pathology Results    RIGHT RE-EXCISION OF BREAST LUMPECTOMY by Dr Cherlynn Cornfield    FINAL MICROSCOPIC DIAGNOSIS:   A. BREAST, RIGHT, RE-EXCISION OF MEDIAL MARGIN:  - Prior procedure site changes.  - No carcinoma identified.   COMMENT:   P63, Calponin and SMM-1 demonstrate the presence of myoepithelium in the  select focus.    12/22/2019 - 01/19/2020 Radiation Therapy   Adjuvant Radiation with Dr Jeryl Moris    05/06/2020 Survivorship   SCP delivered by Lacie Burton, NP    09/04/2022 Mammogram   Bilateral diagnostic 3D mammogram IMPRESSION: No evidence of breast malignancy.   RECOMMENDATION: Per protocol, as the patient is now 2 or more years status post lumpectomy, she may return to annual screening mammography in 1 year. However, given the history of breast cancer, the patient remains eligible for annual diagnostic mammography if preferred. (Code:SM-B-01Y)   BI-RADS CATEGORY  2: Benign.       10/05/2022 Imaging   Bone density test  Normal - significant change noted in bone density of femur       REVIEW OF SYSTEMS:   Constitutional: Denies fevers, chills or abnormal weight loss Eyes: Denies blurriness of vision Ears, nose, mouth, throat, and face: Denies mucositis or sore throat Respiratory: Denies cough, dyspnea or wheezes Cardiovascular: Denies palpitation, chest discomfort or lower extremity swelling Gastrointestinal:  Denies nausea, heartburn or change in bowel habits Skin: Denies abnormal skin rashes Lymphatics: Denies new lymphadenopathy or easy bruising Neurological:Denies numbness, tingling or new weaknesses Behavioral/Psych: Mood is stable, no new changes  All other systems were reviewed with the patient and are negative.   VITALS:   Today's Vitals   06/19/23 0904 06/19/23 0905  BP: 138/68   Pulse: 90   Resp: 18   Temp: 98.1 F (36.7 C)   TempSrc: Temporal   SpO2: 97%   Weight: 186 lb 11.2 oz (84.7 kg)   Height: 5' (1.524 m)   PainSc:  0-No pain   Body mass index is 36.46  kg/m.    Wt Readings from Last 3 Encounters:  06/19/23 186 lb 11.2 oz (84.7 kg)  06/15/23 183 lb 3.2 oz (83.1 kg)  06/04/23 183 lb 2 oz (83.1 kg)    Body mass index is 36.46 kg/m.  Performance status (ECOG): 1 - Symptomatic but completely ambulatory  PHYSICAL EXAM:   GENERAL:alert, no distress and comfortable SKIN: skin color, texture, turgor are normal, no rashes or significant lesions EYES: normal, Conjunctiva are pink and non-injected, sclera clear OROPHARYNX:no exudate, no erythema and lips, buccal mucosa, and tongue normal  NECK: supple, thyroid  normal size, non-tender, without nodularity LYMPH:  no palpable lymphadenopathy in the cervical, axillary or inguinal LUNGS: clear to  auscultation and percussion with normal breathing effort HEART: regular rate & rhythm and no murmurs and no lower extremity edema ABDOMEN:abdomen soft, non-tender and normal bowel sounds Musculoskeletal:no cyanosis of digits and no clubbing  NEURO: alert & oriented x 3 with fluent speech, no focal motor/sensory deficits BREAST: The right breast has lumpectomy scar and surgical scar in the right midaxillary region. These are non tender and well healed. No new masses or lumps appreciated on today's exam. No nipple inversion or nipple discharge. There is no axillary lymphadenopathy on the right. The left breast is without palpable masses or lumps today. There is no nipple inversion or nipple discharge. There is no axillary lymphadenopathy on the left.   LABORATORY DATA:  I have reviewed the data as listed    Component Value Date/Time   NA 141 06/19/2023 0838   NA 141 02/15/2022 1038   K 4.3 06/19/2023 0838   CL 104 06/19/2023 0838   CO2 28 06/19/2023 0838   GLUCOSE 163 (H) 06/19/2023 0838   BUN 18 06/19/2023 0838   BUN 15 02/15/2022 1038   CREATININE 0.60 06/19/2023 0838   CREATININE 0.58 (L) 06/15/2023 1219   CALCIUM  9.8 06/19/2023 0838   PROT 7.3 06/19/2023 0838   ALBUMIN 4.4 06/19/2023 0838    AST 13 (L) 06/19/2023 0838   ALT 12 06/19/2023 0838   ALKPHOS 73 06/19/2023 0838   BILITOT 0.4 06/19/2023 0838   GFRNONAA >60 06/19/2023 0838   GFRNONAA 95 01/07/2020 0907   GFRAA 110 01/07/2020 0907   Lab Results  Component Value Date   WBC 5.9 06/19/2023   NEUTROABS 3.4 06/19/2023   HGB 14.6 06/19/2023   HCT 44.7 06/19/2023   MCV 91.0 06/19/2023   PLT 339 06/19/2023

## 2023-06-19 ENCOUNTER — Inpatient Hospital Stay: Payer: Self-pay | Attending: Nurse Practitioner

## 2023-06-19 ENCOUNTER — Encounter: Payer: Self-pay | Admitting: Nurse Practitioner

## 2023-06-19 ENCOUNTER — Inpatient Hospital Stay (HOSPITAL_BASED_OUTPATIENT_CLINIC_OR_DEPARTMENT_OTHER): Payer: Self-pay | Admitting: Nurse Practitioner

## 2023-06-19 ENCOUNTER — Other Ambulatory Visit: Payer: Self-pay

## 2023-06-19 VITALS — BP 138/68 | HR 90 | Temp 98.1°F | Resp 18 | Ht 60.0 in | Wt 186.7 lb

## 2023-06-19 DIAGNOSIS — E119 Type 2 diabetes mellitus without complications: Secondary | ICD-10-CM | POA: Diagnosis not present

## 2023-06-19 DIAGNOSIS — C50411 Malignant neoplasm of upper-outer quadrant of right female breast: Secondary | ICD-10-CM | POA: Insufficient documentation

## 2023-06-19 DIAGNOSIS — Z170 Estrogen receptor positive status [ER+]: Secondary | ICD-10-CM | POA: Insufficient documentation

## 2023-06-19 DIAGNOSIS — Z7985 Long-term (current) use of injectable non-insulin antidiabetic drugs: Secondary | ICD-10-CM | POA: Diagnosis not present

## 2023-06-19 DIAGNOSIS — Z923 Personal history of irradiation: Secondary | ICD-10-CM | POA: Diagnosis not present

## 2023-06-19 DIAGNOSIS — R232 Flushing: Secondary | ICD-10-CM | POA: Insufficient documentation

## 2023-06-19 DIAGNOSIS — K5900 Constipation, unspecified: Secondary | ICD-10-CM | POA: Diagnosis not present

## 2023-06-19 DIAGNOSIS — Z79811 Long term (current) use of aromatase inhibitors: Secondary | ICD-10-CM | POA: Diagnosis not present

## 2023-06-19 DIAGNOSIS — E1169 Type 2 diabetes mellitus with other specified complication: Secondary | ICD-10-CM

## 2023-06-19 DIAGNOSIS — Z7984 Long term (current) use of oral hypoglycemic drugs: Secondary | ICD-10-CM | POA: Diagnosis not present

## 2023-06-19 DIAGNOSIS — Z7982 Long term (current) use of aspirin: Secondary | ICD-10-CM | POA: Diagnosis not present

## 2023-06-19 DIAGNOSIS — E118 Type 2 diabetes mellitus with unspecified complications: Secondary | ICD-10-CM

## 2023-06-19 LAB — CBC WITH DIFFERENTIAL (CANCER CENTER ONLY)
Abs Immature Granulocytes: 0.03 10*3/uL (ref 0.00–0.07)
Basophils Absolute: 0.1 10*3/uL (ref 0.0–0.1)
Basophils Relative: 1 %
Eosinophils Absolute: 0.1 10*3/uL (ref 0.0–0.5)
Eosinophils Relative: 2 %
HCT: 44.7 % (ref 36.0–46.0)
Hemoglobin: 14.6 g/dL (ref 12.0–15.0)
Immature Granulocytes: 1 %
Lymphocytes Relative: 31 %
Lymphs Abs: 1.8 10*3/uL (ref 0.7–4.0)
MCH: 29.7 pg (ref 26.0–34.0)
MCHC: 32.7 g/dL (ref 30.0–36.0)
MCV: 91 fL (ref 80.0–100.0)
Monocytes Absolute: 0.4 10*3/uL (ref 0.1–1.0)
Monocytes Relative: 7 %
Neutro Abs: 3.4 10*3/uL (ref 1.7–7.7)
Neutrophils Relative %: 58 %
Platelet Count: 339 10*3/uL (ref 150–400)
RBC: 4.91 MIL/uL (ref 3.87–5.11)
RDW: 13.2 % (ref 11.5–15.5)
WBC Count: 5.9 10*3/uL (ref 4.0–10.5)
nRBC: 0 % (ref 0.0–0.2)

## 2023-06-19 LAB — CMP (CANCER CENTER ONLY)
ALT: 12 U/L (ref 0–44)
AST: 13 U/L — ABNORMAL LOW (ref 15–41)
Albumin: 4.4 g/dL (ref 3.5–5.0)
Alkaline Phosphatase: 73 U/L (ref 38–126)
Anion gap: 9 (ref 5–15)
BUN: 18 mg/dL (ref 8–23)
CO2: 28 mmol/L (ref 22–32)
Calcium: 9.8 mg/dL (ref 8.9–10.3)
Chloride: 104 mmol/L (ref 98–111)
Creatinine: 0.6 mg/dL (ref 0.44–1.00)
GFR, Estimated: >60 mL/min (ref >60)
Glucose, Bld: 163 mg/dL — ABNORMAL HIGH (ref 70–99)
Potassium: 4.3 mmol/L (ref 3.5–5.1)
Sodium: 141 mmol/L (ref 135–145)
Total Bilirubin: 0.4 mg/dL (ref 0.0–1.2)
Total Protein: 7.3 g/dL (ref 6.5–8.1)

## 2023-06-19 MED ORDER — TIRZEPATIDE 5 MG/0.5ML ~~LOC~~ SOAJ: 5.0000 mg | SUBCUTANEOUS | 0 refills | Status: DC

## 2023-06-19 NOTE — Addendum Note (Signed)
 Addended by: Rande Bushy on: 06/19/2023 10:51 AM   Modules accepted: Orders

## 2023-06-21 LAB — HEMOGLOBIN A1C
Hgb A1c MFr Bld: 7.3 % — ABNORMAL HIGH (ref <5.7)
Mean Plasma Glucose: 163 mg/dL
eAG (mmol/L): 9 mmol/L

## 2023-06-21 LAB — TSH: TSH: 2.57 m[IU]/L (ref 0.40–4.50)

## 2023-06-21 LAB — CBC WITH DIFFERENTIAL/PLATELET
Absolute Lymphocytes: 1626 {cells}/uL (ref 850–3900)
Absolute Monocytes: 653 {cells}/uL (ref 200–950)
Basophils Absolute: 64 {cells}/uL (ref 0–200)
Basophils Relative: 0.6 %
Eosinophils Absolute: 107 {cells}/uL (ref 15–500)
Eosinophils Relative: 1 %
HCT: 45.4 % — ABNORMAL HIGH (ref 35.0–45.0)
Hemoglobin: 14.8 g/dL (ref 11.7–15.5)
MCH: 29.8 pg (ref 27.0–33.0)
MCHC: 32.6 g/dL (ref 32.0–36.0)
MCV: 91.5 fL (ref 80.0–100.0)
MPV: 9.4 fL (ref 7.5–12.5)
Monocytes Relative: 6.1 %
Neutro Abs: 8250 {cells}/uL — ABNORMAL HIGH (ref 1500–7800)
Neutrophils Relative %: 77.1 %
Platelets: 321 10*3/uL (ref 140–400)
RBC: 4.96 10*6/uL (ref 3.80–5.10)
RDW: 12.5 % (ref 11.0–15.0)
Total Lymphocyte: 15.2 %
WBC: 10.7 10*3/uL (ref 3.8–10.8)

## 2023-06-21 LAB — LYME DISEASE ANTIBODY (IGG), IMMUNOBLOT
B burgdorferi IgG Abs (IB): NEGATIVE
Lyme Disease 18 kD IgG: NONREACTIVE
Lyme Disease 23 kD IgG: NONREACTIVE
Lyme Disease 28 kD IgG: NONREACTIVE
Lyme Disease 30 kD IgG: NONREACTIVE
Lyme Disease 39 kD IgG: NONREACTIVE
Lyme Disease 41 kD IgG: NONREACTIVE
Lyme Disease 45 kD IgG: NONREACTIVE
Lyme Disease 58 kD IgG: NONREACTIVE
Lyme Disease 66 kD IgG: NONREACTIVE
Lyme Disease 93 kD IgG: NONREACTIVE

## 2023-06-21 LAB — LIPID PANEL
Cholesterol: 110 mg/dL (ref <200)
HDL: 67 mg/dL (ref >=50)
LDL Cholesterol (Calc): 20 mg/dL
Non-HDL Cholesterol (Calc): 43 mg/dL (ref <130)
Total CHOL/HDL Ratio: 1.6 (calc) (ref <5.0)
Triglycerides: 144 mg/dL (ref <150)

## 2023-06-21 LAB — COMPREHENSIVE METABOLIC PANEL WITH GFR
AG Ratio: 1.8 (calc) (ref 1.0–2.5)
ALT: 13 U/L (ref 6–29)
AST: 12 U/L (ref 10–35)
Albumin: 4.3 g/dL (ref 3.6–5.1)
Alkaline phosphatase (APISO): 70 U/L (ref 37–153)
BUN/Creatinine Ratio: 26 (calc) — ABNORMAL HIGH (ref 6–22)
BUN: 15 mg/dL (ref 7–25)
CO2: 29 mmol/L (ref 20–32)
Calcium: 10.3 mg/dL (ref 8.6–10.4)
Chloride: 100 mmol/L (ref 98–110)
Creat: 0.58 mg/dL — ABNORMAL LOW (ref 0.60–1.00)
Globulin: 2.4 g/dL (ref 1.9–3.7)
Glucose, Bld: 150 mg/dL — ABNORMAL HIGH (ref 65–99)
Potassium: 4.3 mmol/L (ref 3.5–5.3)
Sodium: 139 mmol/L (ref 135–146)
Total Bilirubin: 0.5 mg/dL (ref 0.2–1.2)
Total Protein: 6.7 g/dL (ref 6.1–8.1)
eGFR: 95 mL/min/{1.73_m2} (ref >=60)

## 2023-06-21 LAB — MICROALBUMIN / CREATININE URINE RATIO
Creatinine, Urine: 44 mg/dL (ref 20–275)
Microalb Creat Ratio: 5 mg/g{creat} (ref <30)
Microalb, Ur: 0.2 mg/dL

## 2023-06-22 ENCOUNTER — Other Ambulatory Visit: Payer: Self-pay

## 2023-06-22 DIAGNOSIS — E1169 Type 2 diabetes mellitus with other specified complication: Secondary | ICD-10-CM

## 2023-06-22 DIAGNOSIS — E118 Type 2 diabetes mellitus with unspecified complications: Secondary | ICD-10-CM

## 2023-06-22 MED ORDER — TIRZEPATIDE 5 MG/0.5ML ~~LOC~~ SOAJ
5.0000 mg | SUBCUTANEOUS | 0 refills | Status: DC
Start: 2023-06-22 — End: 2023-09-13

## 2023-06-26 DIAGNOSIS — H52202 Unspecified astigmatism, left eye: Secondary | ICD-10-CM | POA: Diagnosis not present

## 2023-06-26 DIAGNOSIS — H25812 Combined forms of age-related cataract, left eye: Secondary | ICD-10-CM | POA: Diagnosis not present

## 2023-06-26 DIAGNOSIS — H2512 Age-related nuclear cataract, left eye: Secondary | ICD-10-CM | POA: Diagnosis not present

## 2023-07-02 ENCOUNTER — Telehealth: Payer: Self-pay

## 2023-07-02 NOTE — Telephone Encounter (Signed)
 Copied from CRM [id number]. Topic: Clinical - Prescription Issue >> Jul 02, 2023  1:07 PM Stanly Early wrote: Reason for CRM: tirzepatide  (MOUNJARO ) 5 MG/0.5ML Pen, pharmacy stated they wont fill it until they can get clarification that this medication wont cause complications with allergic reaction.  Patient would like a callback, [phone]

## 2023-07-16 ENCOUNTER — Encounter: Payer: Self-pay | Admitting: Internal Medicine

## 2023-07-17 ENCOUNTER — Other Ambulatory Visit: Payer: Self-pay

## 2023-07-17 NOTE — Therapy (Unsigned)
 OUTPATIENT PHYSICAL THERAPY UPPER EXTREMITY EVALUATION   Patient Name: Michelle Sherman MRN: [id number] DOB:[birth year], 73 y.o., female Today's Date: 07/18/2023  END OF SESSION:  PT End of Session - 07/18/23 1259     Visit Number 1    Number of Visits 12    Date for PT Re-Evaluation 08/15/23    Authorization Type [employer] Medicare    Authorization Time Period Auth requested    PT Start Time 1303    PT Stop Time 1345    PT Time Calculation (min) 42 min    Activity Tolerance Patient tolerated treatment well    Behavior During Therapy [facility] for tasks assessed/performed          Past Medical History:  Diagnosis Date   Acquired trigger finger of left middle finger 04/11/2021   Acquired trigger finger of right index finger 04/11/2021   Acquired trigger finger of right middle finger 04/11/2021   Allergy    ANA positive 08/16/2021   Arthritis    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 lfts in 100s, resolved with cessation of lipitor. Normal liver ultrasound   Elevated CK 08/15/2021   Family history of adverse reaction to anesthesia    sister, hard to wake up    Family history of lung cancer    Family history of pancreatic cancer    Family history of pancreatic cancer    Genetic testing 10/06/2019   Negative genetic testing:  No pathogenic variants detected on the [facility] Breast Cancer STAT Panel + Common Hereditary Cancers Panel. 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 The report date is 10/05/2019.     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 I discussed option of draining seroma and ways to do this as well as risks.  I discussed that draining would likely make her breast less heavy feeling.  I reviewed that drainage would likely make the right breast smaller and the nipple point inferiorly.  I discussed that the seroma could recur and we would be in    Severe obesity (BMI 35.0-35.9 with comorbidity) (HCC) 11/01/2016   Sleep apnea    wears CPAP nightly   Thyroid  disease    Ulcerative colitis [facility])    Past Surgical History:  Procedure Laterality Date   BREAST LUMPECTOMY Right 10/2019   BREAST LUMPECTOMY WITH RADIOACTIVE SEED AND SENTINEL LYMPH NODE BIOPSY Right 10/14/2019   Procedure: RIGHT BREAST LUMPECTOMY WITH RADIOACTIVE SEED AND SENTINEL LYMPH NODE BIOPSY;  Surgeon: Aron Shoulders,  MD;  Location: [facility];  Service: General;  Laterality: Right;   CHOLECYSTECTOMY     RE-EXCISION OF BREAST LUMPECTOMY Right 11/11/2019   Procedure: RIGHT RE-EXCISION OF BREAST LUMPECTOMY;  Surgeon: Aron Shoulders, MD;  Location: [facility];  Service: General;  Laterality: Right;  RNFA   SKIN SURGERY  12/2015   Fatty tumor removed   TONSILLECTOMY AND ADENOIDECTOMY  1956   Patient Active Problem List   Diagnosis Date Noted   Breast cancer (HCC) 09/30/2022   Type 2 diabetes mellitus with complications (HCC) 03/18/2022   Need for  tetanus booster 03/18/2022   Annual physical exam 03/18/2022   Obesity, Class II, BMI 35-39.9, isolated (see actual BMI) 12/31/2021   Rash 12/31/2021   Bilateral carpal tunnel syndrome 04/12/2021   Hand arthritis 01/27/2021   Arthritis of hand 01/27/2021   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   Genetic testing 10/06/2019   Hypertension associated with diabetes (HCC) 11/01/2016   Hyperlipidemia associated with type 2 diabetes mellitus (HCC) 01/15/2012   Allergic rhinitis 10/05/2011   Ulcerative colitis without complications (HCC) 10/05/2011   Acquired hypothyroidism 10/04/2010   Osteoarthrosis of knee 10/04/2010   OSA on CPAP 06/28/2010    PCP: Duanne Butler DASEN, MD  REFERRING PROVIDER: Duanne Butler DASEN, MD  REFERRING DIAG: M75.91 (ICD-10-CM) - Right supraspinatus tendinitis  THERAPY DIAG:  Decreased ROM of right shoulder  Acute pain of right shoulder  Impaired functional mobility and endurance  Rationale for Evaluation and Treatment: Rehabilitation  ONSET DATE: ~2 months   SUBJECTIVE:                                                                                                                                                                                      SUBJECTIVE STATEMENT: Patient reports she does not think there was a specific incident that started this pain. Biggest issue recently is that the pain wakes her up at night, reporting her arm feels like its sleep, that static-like feeling and travels down to wrist sometimes. Reports issues with pouring a gallon a milk some days. Reports shoulder pain is more in front of the shoulder, and sharp. Has to help husband physically as he is a bilateral BKA with contractures. Reports he can help some but provides mod/max A. Should be getting hoyer lift soon.  Hand dominance: Right  PERTINENT HISTORY: Hx of Breast Cancer 2021 Previous L shoulder issue/pain   PAIN:  Are you having pain? Yes: NPRS scale: 2-3/10 Pain location: Front  of shoulder, intermittently traveling down to elbow,  to wrist at night Pain description: Sharp, Numbness/tingling Aggravating factors: Lifting, flexion, abduction Relieving factors: Rest  PRECAUTIONS: None  RED FLAGS: None   WEIGHT BEARING RESTRICTIONS: No  FALLS:  Has patient fallen in last 6 months? No  LIVING ENVIRONMENT: Lives with: lives with their spouse Caregiver for spouse (see subjective)  OCCUPATION: Retired, Clinical biochemist  PLOF: Independent  PATIENT GOALS: To figure out some way to help the pain and stop the pain at night  NEXT MD VISIT: Maybe in two weeks   OBJECTIVE:  Note: Objective measures were completed at Evaluation unless otherwise noted.  DIAGNOSTIC FINDINGS:  N/A  PATIENT SURVEYS :  QuickDASH Score: 61.4 / 100 = 61.4 %  COGNITION: Overall cognitive status: Within functional limits for tasks assessed     SENSATION: WFL  POSTURE: Rounded Shoulders/increased kyphosis   UPPER EXTREMITY ROM:   Active ROM Right eval Left eval  Shoulder flexion 140, * at mid-range of motion AROM and PROM WNL  Shoulder extension    Shoulder abduction 135, * at mid-range of motion AROM and PROM [facility]  Shoulder adduction    Shoulder internal rotation    Shoulder external rotation    Elbow flexion    Elbow extension    Wrist flexion    Wrist extension    Wrist ulnar deviation    Wrist radial deviation    Wrist pronation    Wrist supination    (Blank rows = not tested)   *painful  UPPER EXTREMITY MMT:  MMT Right eval Left eval  Shoulder flexion    Shoulder extension    Shoulder abduction    Shoulder adduction    Shoulder internal rotation    Shoulder external rotation    Middle trapezius    Lower trapezius    Elbow flexion    Elbow extension    Wrist flexion    Wrist extension    Wrist ulnar deviation    Wrist radial deviation    Wrist pronation    Wrist supination    Grip strength (lbs)    (Blank rows = not tested)  SHOULDER  SPECIAL TESTS: Impingement tests: Hawkins/Kennedy impingement test: positive  SLAP lesions:  Instability tests: Apprehension test: positive  (Modified: Pt in ~45 deg abd + ER w/ pain, improved with AP pressure) Rotator cuff assessment:  Biceps assessment: Speed's test: positive   JOINT MOBILITY TESTING:  Gentle long axis distraction of GH joint inferiorly, no excessive joint motion noted this date, pt reporting light stretch sensation but no excessive discomfort    PALPATION:  Discomfort with palpation around [facility] joint, and inferiorly around region of coracoid process.  Mild Tightness noted and pt reporting comfort during scapular mobility, pt in L side-lying position                                                                                                                            TREATMENT DATE:  07/18/23: PT eval and HEP   PATIENT EDUCATION: Education details: PT evaluation, objective findings, POC, Importance of HEP, Precautions, Clinic policies  Person educated: Patient  Education method: Medical illustrator Education comprehension: verbalized understanding and returned demonstration  HOME EXERCISE PROGRAM: Access Code: [email or web address] URL: [email or web address] Date: 07/18/2023 Prepared by: Rosaria Powell-Butler  Exercises - Supine Shoulder Abduction AROM  - 2 x daily - 7 x weekly - 3 sets - 10 reps - Seated Scapular Retraction  - 2 x daily - 7 x weekly - 3 sets - 10 reps - 10 hold - Sidelying Shoulder External Rotation  - 2 x daily - 7 x weekly - 3 sets - 10 reps  ASSESSMENT:  CLINICAL IMPRESSION: Patient is a 73 y.o. female who was seen today for physical therapy evaluation and treatment for M75.91 (ICD-10-CM) - Right supraspinatus tendinitis. Patient arrives to PT with reports of increased R shoulder and extremity pain this date. R shoulder pain is recreated with active and passive flexion and abduction, pain noted most around ~90 deg of motion.  Patient reports numbness/tingling pain into UE mostly occurs during night. When asked about sleeping position, patient reports she sleep on her sides but changes positions at night. Education of sleep position (especially sleeping on R with pressure on R shoulder or with RUE raised above shoulder height) could be causing compression on Upper extremity nerves causing symptoms. Patient demonstrates decreased ROM with R shoulder, poor posture, impaired scapulohumeral rhythm, and positive results from impingement tests. Would suspect bicep or RC involvement. Patient will benefit from continued skilled physical therapy in order to address the above/below to improve function/QOL.    OBJECTIVE IMPAIRMENTS: decreased activity tolerance, decreased ROM, decreased strength, increased muscle spasms, impaired flexibility, impaired UE functional use, improper body mechanics, postural dysfunction, and pain.   ACTIVITY LIMITATIONS: carrying, lifting, reach over head, and caring for others  PARTICIPATION LIMITATIONS: meal prep, cleaning, laundry, and Caring for husband  PERSONAL FACTORS: N/A are also affecting patient's functional outcome.   REHAB POTENTIAL: Good  CLINICAL DECISION MAKING: Stable/uncomplicated  EVALUATION COMPLEXITY: Low  GOALS: Goals reviewed with patient? No  SHORT TERM GOALS: Target date: 08/01/23 Patient will be independent with performance of HEP to demonstrate adequate self management of symptoms.  Baseline:  Goal status: INITIAL  2.   Patient will report at least a 25% improvement with function or pain overall since beginning PT. Baseline:  Goal status: INITIAL LONG TERM GOALS: Target date: 08/29/23  Patient will report a consistent 2/10 or lower pain rating with daily functional activities in order to demonstrate improved activity tolerance.  Baseline:  Goal status: INITIAL  2.  Patient will achieve at least 160 deg flexion with RUE in order to be able to reach overhead without  increased pain. Baseline:  Goal status: INITIAL  3.   Patient will achieve at least 170 deg flexion with RUE in order to be able to reach overhead without increased pain. Baseline:  Goal status: INITIAL  4.  Patient will improve QuickDASH score by at least 14 percent to achieve MCID and demonstrate improved overall function.  Baseline:  Goal status: INITIAL  5.   Patient will report at least a 50% improvement with function or pain overall since beginning PT. Baseline:  Goal status:   PLAN: PT FREQUENCY: 2x/week  PT DURATION: 6 weeks  PLANNED INTERVENTIONS: 97164- PT Re-evaluation, 97110-Therapeutic exercises, 97530- Therapeutic activity, [id number]- Neuromuscular re-education, 97535- Self Care, [id number]- Manual therapy, [id number]- Gait training, [phone]- Electrical stimulation (manual), [phone] (1-2 muscles), 20561 (3+ muscles)- Dry Needling, Patient/Family education, Taping, Joint mobilization, Spinal mobilization, Cryotherapy, and Moist heat  PLAN FOR NEXT SESSION: Review HEP and goals,  Assess cerv ROM, assess UE ER/IR, progress UE mobility to pt tolerance, postural strengthening, practice caregiver  body mechanics, manual: scapular mobs, GH mobs, distraction    2:53 PM, 07/18/23 Rosaria Settler, PT, DPT [facility] Medicare Auth Request Information  Date of referral: 06/15/23 Referring provider: Duanne Butler DASEN, MD Referring diagnosis (ICD 10)? M75.91 (ICD-10-CM) - Right supraspinatus tendinitis Treatment diagnosis (ICD 10)? (if different than referring diagnosis) M25.611, M25.511, Z74.09  Functional Tool Score: QuickDASH Score: 61.4 / 100 = 61.4 %  What was this (referring dx) caused by? Ongoing Issue  Lysle of Condition: Initial Onset (within last 3 months)   Laterality: Rt  Current Functional Measure Score: QuickDASH Score: 61.4 / 100 = 61.4 %  Objective measurements identify impairments when they are compared to normal values, the  uninvolved extremity, and prior level of function.  [x]  Yes  []  No  Objective assessment of functional ability: Moderate functional limitations   Briefly describe symptoms: Patient reports increased pain on anterior aspect of R shoulder when lifting her RUE and intermittent numbness and tingling into her R hand which are limiting her ability to perform overhead activities and caring for her disabled husband.   How did symptoms start: Insidiously  Average pain intensity:  Last 24 hours: 3/10  Past week: 10/10  How often does the pt experience symptoms? Frequently  How much have the symptoms interfered with usual daily activities? Quite a bit  How has condition changed since care began at this facility? NA - initial visit  In general, how is the patients overall health? Good   BACK PAIN (STarT Back Screening Tool) No

## 2023-07-18 ENCOUNTER — Other Ambulatory Visit: Payer: Self-pay

## 2023-07-18 ENCOUNTER — Ambulatory Visit (HOSPITAL_COMMUNITY): Attending: Family Medicine

## 2023-07-18 ENCOUNTER — Encounter (HOSPITAL_COMMUNITY): Payer: Self-pay

## 2023-07-18 DIAGNOSIS — M7591 Shoulder lesion, unspecified, right shoulder: Secondary | ICD-10-CM | POA: Insufficient documentation

## 2023-07-18 DIAGNOSIS — M25511 Pain in right shoulder: Secondary | ICD-10-CM | POA: Insufficient documentation

## 2023-07-18 DIAGNOSIS — M25611 Stiffness of right shoulder, not elsewhere classified: Secondary | ICD-10-CM | POA: Insufficient documentation

## 2023-07-18 DIAGNOSIS — Z7409 Other reduced mobility: Secondary | ICD-10-CM | POA: Diagnosis not present

## 2023-07-20 ENCOUNTER — Other Ambulatory Visit: Payer: Self-pay

## 2023-07-20 MED ORDER — FAMOTIDINE 40 MG PO TABS: 40.0000 mg | ORAL_TABLET | Freq: Two times a day (BID) | ORAL | 0 refills | Status: DC

## 2023-07-20 MED ORDER — MESALAMINE 1.2 G PO TBEC: 2.4000 g | DELAYED_RELEASE_TABLET | Freq: Two times a day (BID) | ORAL | 0 refills | Status: DC

## 2023-07-24 ENCOUNTER — Encounter: Payer: Self-pay | Admitting: Family Medicine

## 2023-07-25 ENCOUNTER — Encounter: Payer: Self-pay | Admitting: Internal Medicine

## 2023-07-25 ENCOUNTER — Ambulatory Visit: Payer: Self-pay | Admitting: Internal Medicine

## 2023-07-25 ENCOUNTER — Ambulatory Visit (INDEPENDENT_AMBULATORY_CARE_PROVIDER_SITE_OTHER): Admitting: Internal Medicine

## 2023-07-25 ENCOUNTER — Other Ambulatory Visit (INDEPENDENT_AMBULATORY_CARE_PROVIDER_SITE_OTHER)

## 2023-07-25 VITALS — BP 118/60 | HR 88 | Ht 58.75 in | Wt 180.2 lb

## 2023-07-25 DIAGNOSIS — K5190 Ulcerative colitis, unspecified, without complications: Secondary | ICD-10-CM | POA: Diagnosis not present

## 2023-07-25 DIAGNOSIS — K51919 Ulcerative colitis, unspecified with unspecified complications: Secondary | ICD-10-CM

## 2023-07-25 DIAGNOSIS — K219 Gastro-esophageal reflux disease without esophagitis: Secondary | ICD-10-CM

## 2023-07-25 LAB — VITAMIN D 25 HYDROXY (VIT D DEFICIENCY, FRACTURES): VITD: 66.76 ng/mL (ref 30.00–100.00)

## 2023-07-25 LAB — IBC + FERRITIN
Ferritin: 21.8 ng/mL (ref 10.0–291.0)
Iron: 53 ug/dL (ref 42–145)
Saturation Ratios: 13.4 % — ABNORMAL LOW (ref 20.0–50.0)
TIBC: 394.8 ug/dL (ref 250.0–450.0)
Transferrin: 282 mg/dL (ref 212.0–360.0)

## 2023-07-25 LAB — VITAMIN B12: Vitamin B-12: 707 pg/mL (ref 211–911)

## 2023-07-25 LAB — FOLATE: Folate: >23.2 ng/mL (ref >5.9)

## 2023-07-25 NOTE — Patient Instructions (Addendum)
 Your provider has requested that you go to the basement level for lab work before leaving today. Press B on the elevator. The lab is located at the first door on the left as you exit the elevator.  We have sent the following medications to your pharmacy for you to pick up at your convenience: Famotidine,Lialda  Follow up in 1 Year _______________________________________________________  If your blood pressure at your visit was 140/90 or greater, please contact your primary care physician to follow up on this.  _______________________________________________________  If you are age 73 or older, your body mass index should be between 23-30. Your Body mass index is 36.72 kg/m. If this is out of the aforementioned range listed, please consider follow up with your Primary Care Provider.  If you are age 78 or younger, your body mass index should be between 19-25. Your Body mass index is 36.72 kg/m. If this is out of the aformentioned range listed, please consider follow up with your Primary Care Provider.   ________________________________________________________  The [facility] providers would like to encourage you to use MYCHART to communicate with providers for non-urgent requests or questions.  Due to long hold times on the telephone, sending your provider a message by [facility] may be a faster and more efficient way to get a response.  Please allow 48 business hours for a response.  Please remember that this is for non-urgent requests.  _______________________________________________________  Due to recent changes in healthcare laws, you may see the results of your imaging and laboratory studies on MyChart before your provider has had a chance to review them.  We understand that in some cases there may be results that are confusing or concerning to you. Not all laboratory results come back in the same time frame and the provider may be waiting for multiple results in order to interpret others.   Please give us  48 hours in order for your provider to thoroughly review all the results before contacting the office for clarification of your results.   Thank you for entrusting me with your care and for choosing [facility], Dr. Estefana Kidney

## 2023-07-25 NOTE — Progress Notes (Signed)
 Chief Complaint: UC  HPI : 73 year old female with history of DM and UC presents for follow up for UC  She previously followed with Dr. Kristie who originally diagnosed her with UC in 2013. She cannot recall whether or not she had pan-colitis or left sided colitis. Shortly afterwards, she was started on Lialda therapy and has done well on this ever since. Denies rectal bleeding. She has diarrhea issues off-and-on. She was put on Mounjaro  recently, which seems to be helping with her diarrhea. She has taken 3 doses of Mounjaro  so far. The Endorses 1-3 Bms per day. Previously she was having 5-6 Bms pre day when she was not taking Mounjaro . Denies nocturnal stools. She has a little bit occasional ab pain that is due to Mounjaro . Denies N&V, dysphagia, chest burning, regurgitation. She takes famotidine regularly, which helps keep her reflux under good control. Mother had colitis. She has arthritis but this has never been associated with her ulcerative colitis. Denies vision changes, ulcers in mucosal membranes, or skin rashes. 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  IBD Characteristics: Type: Ulcerative colitis Location: Not clear whether or not this is pan-colitis versus left sided Diagnosed: 2013 Extraintestinal manifestations: None Previous therapies: None Prior hospitalizations: None Prior flares: None Current therapy: Lialda (since 2013)   Wt Readings from Last 3 Encounters:  07/25/23 180 lb 4 oz (81.8 kg)  06/19/23 186 lb 11.2 oz (84.7 kg)  06/15/23 183 lb 3.2 oz (83.1 kg)   Past Medical History:  Diagnosis Date   Acquired trigger finger of left middle finger 04/11/2021   Acquired trigger finger of right index finger 04/11/2021   Acquired trigger finger of right middle finger 04/11/2021   Allergy    ANA positive 08/16/2021   Arthritis    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 lfts in 100s, resolved with cessation of lipitor. Normal liver ultrasound   Elevated CK 08/15/2021   Family history of adverse reaction to anesthesia    sister, hard to wake up    Family history of lung cancer    Family history of pancreatic cancer    Family history of pancreatic cancer    Genetic testing 10/06/2019   Negative genetic testing:  No pathogenic variants detected on the [facility] Breast Cancer STAT Panel + Common Hereditary Cancers Panel. 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 The report date is 10/05/2019.     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 I discussed option of draining seroma and ways to do this as well as risks.  I discussed that draining would likely make her breast less heavy feeling.  I reviewed that drainage would likely make the right breast smaller and the nipple point inferiorly.  I discussed that the seroma could recur and we would be in    Severe obesity (BMI 35.0-35.9 with comorbidity) (HCC)  11/01/2016   Sleep apnea    wears CPAP nightly   Thyroid  disease    Ulcerative colitis [facility])      Past Surgical History:  Procedure Laterality Date   BREAST LUMPECTOMY Right 10/2019   BREAST LUMPECTOMY WITH RADIOACTIVE SEED AND SENTINEL LYMPH NODE BIOPSY Right 10/14/2019   Procedure: RIGHT BREAST LUMPECTOMY WITH RADIOACTIVE SEED AND SENTINEL LYMPH NODE BIOPSY;  Surgeon: Aron Shoulders, MD;  Location: [facility];  Service: General;  Laterality: Right;   CHOLECYSTECTOMY     RE-EXCISION OF BREAST LUMPECTOMY Right 11/11/2019   Procedure: RIGHT RE-EXCISION OF BREAST LUMPECTOMY;  Surgeon: Aron Shoulders, MD;  Location: [facility];  Service: General;  Laterality: Right;  RNFA   SKIN SURGERY  12/2015   Fatty tumor removed   TONSILLECTOMY AND ADENOIDECTOMY  1956   Family History  Problem Relation Age of Onset   Ulcerative colitis Mother    Diabetes Mother    Heart disease Father        mid 87s   Lung cancer Father 1       Lung   Varicose Veins Sister    Other Sister        stiff heart   Diabetes Brother    Pancreatic cancer Brother 20   Crohn's disease Brother    Ulcerative colitis Brother    Diabetes Brother    Colon cancer Brother 30 - 63   Diabetes Maternal Aunt    Diabetes Maternal Uncle    Lung cancer Paternal Aunt        smoker   Cancer Paternal Aunt        unknown type   Heart attack Paternal Grandfather    Esophageal cancer Neg Hx    Rectal cancer Neg Hx    Stomach cancer Neg Hx    Social History   Tobacco Use   Smoking status: Never   Smokeless tobacco: Never  Vaping Use   Vaping status: Never Used  Substance Use Topics   Alcohol use: Yes    Comment: occa   Drug use: No   Current Outpatient Medications  Medication Sig Dispense Refill   aspirin EC 81 MG tablet Take 1 tablet by mouth daily.     Biotin 2500 MCG CAPS Take 1 capsule by mouth daily.     Cholecalciferol (VITAMIN D3) 2000 units capsule Take 1 capsule by mouth  daily.     empagliflozin  (JARDIANCE ) 25 MG TABS tablet Take 1 tablet (25 mg total) by mouth daily before breakfast. 90 tablet 3   Evolocumab  (REPATHA  SURECLICK) 140 MG/ML SOAJ Inject 140 mg into the skin every 14 (fourteen) days. 6 mL 3   exemestane  (AROMASIN ) 25 MG tablet Take 1 tablet (25 mg total) by mouth daily after breakfast. 90 tablet 3   ezetimibe  (ZETIA ) 10 MG tablet Take 1 tablet (10 mg total) by mouth daily. 90 tablet 3   famotidine (PEPCID) 40 MG tablet Take 1 tablet (40 mg total) by mouth 2 (two) times daily. 60 tablet 0   fexofenadine (  ALLEGRA) 180 MG tablet Take 180 mg by mouth daily as needed.     fluticasone (FLONASE) 50 MCG/ACT nasal spray Place 1 spray into both nostrils daily as needed.     icosapent  Ethyl (VASCEPA ) 1 g capsule Take 1 capsule (1 g total) by mouth 2 (two) times daily. 180 capsule 3   levothyroxine  (SYNTHROID ) 112 MCG tablet Take 1 tablet (112 mcg total) by mouth daily. 90 tablet 3   MELATONIN PO Take 5-10 mg by mouth at bedtime.     mesalamine (LIALDA) 1.2 g EC tablet Take 2 tablets (2.4 g total) by mouth 2 (two) times daily. 120 tablet 0   metFORMIN  (GLUCOPHAGE ) 1000 MG tablet Take 1 tablet (1,000 mg total) by mouth 2 (two) times daily with a meal. 180 tablet 3   Misc Natural Products (TURMERIC CURCUMIN) CAPS Take 1 capsule by mouth daily. 1000 mg     Multiple Vitamin (MULTIVITAMIN) tablet Take 1 tablet by mouth daily.     ramipril  (ALTACE ) 10 MG capsule Take 1 capsule (10 mg total) by mouth daily. 90 capsule 3   tirzepatide  (MOUNJARO ) 5 MG/0.5ML Pen Inject 5 mg into the skin once a week. 2 mL 0   traZODone  (DESYREL ) 50 MG tablet Take 0.5-1 tablets (25-50 mg total) by mouth at bedtime as needed for sleep. 90 tablet 3   No current facility-administered medications for this visit.   Allergies  Allergen Reactions   Lipitor [Atorvastatin ] Other (See Comments)    Hepatitis and myopathy, lfts 100s and elevated CK with symptoms   Nickel Other (See Comments) and  Rash    drainage   Sulfa Antibiotics Rash   Bydureon  [Exenatide ] Nausea And Vomiting     Review of Systems: All systems reviewed and negative except where noted in HPI.   Physical Exam: BP 118/60 (BP Location: Left Arm, Patient Position: Sitting, Cuff Size: Large)   Pulse 88   Ht 4' 10.75 (1.492 m) Comment: height measured without shoes  Wt 180 lb 4 oz (81.8 kg)   BMI 36.72 kg/m  Constitutional: Pleasant,well-developed, female in no acute distress. HEENT: Normocephalic and atraumatic. Conjunctivae are normal. No scleral icterus. Cardiovascular: Normal rate, regular rhythm.  Pulmonary/chest: Effort normal and breath sounds normal. No wheezing, rales or rhonchi. Abdominal: Soft, nondistended, nontender. Bowel sounds active throughout. There are no masses palpable. No hepatomegaly. Extremities: No edema Neurological: Alert and oriented to person place and time. Skin: Skin is warm and dry. No rashes noted. Psychiatric: Normal mood and affect. Behavior is normal.  Labs 05/2023: CBC nml. CMP with elevated glucose of 163. TSH nml. Lyme disease negative.  Colonoscopy 08/25/20: Indication was change in bowel habits. UC diagnosed in 2013. Multiple small and large mouthed diverticula found in the sigmoid colon. Normal TI. Repeat colonoscopy recommended in 2 years.  Colonoscopy 03/06/23: - The terminal ileum appeared normal. - A 4 mm polyp was found in the cecum. The polyp was sessile. The polyp was removed with a cold snare. Resection and retrieval were complete. - Inflammation was not found based on the endoscopic appearance of the mucosa in the colon. This was graded as Mayo Score 0 ( normal or inactive disease) . Four biopsies were taken every 10 cm with a cold forceps from the right colon, left colon and transverse colon for ulcerative colitis surveillance. These biopsy specimens from the right colon, left colon and transverse colon were sent to Pathology. - Multiple diverticula were found in  the sigmoid colon. - Non- bleeding internal hemorrhoids  were found during retroflexion. The hemorrhoids were severe. Path:      1. Surgical [P], colon, cecum, polyp (1) :       BENIGN COLONIC MUCOSA WITH NO DIAGNOSTIC ABNORMALITY       2. Surgical [P], right colon biopsies :       BENIGN COLONIC MUCOSA WITH NO DIAGNOSTIC ABNORMALITY       3. Surgical [P], colon, transverse :       BENIGN COLONIC MUCOSA WITH NO DIAGNOSTIC ABNORMALITY       4. Surgical [P], left colon biopsies :       BENIGN COLONIC MUCOSA WITH NO DIAGNOSTIC ABNORMALITY   ASSESSMENT AND PLAN: Ulcerative colitis GERD Patient presents to establish care for ulcerative colitis. She has occasional diarrhea. I recently did a colonoscopy on the patient in 02/2023 that showed no signs of active inflammation or dysplasia. Will check her vitamin levels today. Her recent Cr looked good in 05/2023 and will plan to follow this annually while she is on Lialda therapy.  - Check ferritin/IBC, folate/vitamin B12, vitamin D  - Continue Lialda therapy. Refill - Cont famotidine therapy. Refill - Will plan for next colonoscopy in 02/2026 for surveillance of UC - RTC 1 year  Estefana Kidney, MD

## 2023-07-28 NOTE — Progress Notes (Unsigned)
  HPI female never smoker followed for OSA complicated by hypothyroid, DM 2, hyperlipidemia, allergic rhinitis, HBP, Ulcerative Colitis, morbid obesity, Breast Cancer R,  HST 02/05/17-AHI 23.4/hour, desaturation to 86%, body weight 192 pounds  ========================================================================================================  07/31/22-  73 year old female never smoker followed for OSA complicated by hypothyroid, DM 2, hyperlipidemia, allergic rhinitis, HBP, Ulcerative Colitis, morbid obesity, Breast Cancer R,  CPAP auto 5-15/ [employer]   AirSense 10 AutoSet Download-compliance 100%, AHI 0.7/ hr  07/31/23- 73 year old female never smoker followed for OSA complicated by hypothyroid, DM 2, hyperlipidemia, allergic rhinitis, HBP, Ulcerative Colitis, morbid obesity, Breast Cancer R,  CPAP auto 5-15/ [employer]   AirSense 10 AutoSet Download-compliance  Body weight today-

## 2023-07-31 ENCOUNTER — Ambulatory Visit (INDEPENDENT_AMBULATORY_CARE_PROVIDER_SITE_OTHER): Payer: Medicare PPO | Admitting: Internal Medicine

## 2023-07-31 ENCOUNTER — Encounter: Payer: Self-pay | Admitting: Internal Medicine

## 2023-07-31 VITALS — BP 106/64 | HR 80 | Temp 98.2°F | Ht 59.0 in | Wt 178.0 lb

## 2023-07-31 DIAGNOSIS — G4709 Other insomnia: Secondary | ICD-10-CM

## 2023-07-31 DIAGNOSIS — G4733 Obstructive sleep apnea (adult) (pediatric): Secondary | ICD-10-CM | POA: Diagnosis not present

## 2023-07-31 MED ORDER — TRAZODONE HCL 100 MG PO TABS: ORAL_TABLET | ORAL | 1 refills | Status: AC

## 2023-07-31 NOTE — Patient Instructions (Addendum)
 Order- DME [employer]/ [facility]- please replace old CPAP (motor life exceeded) auto 5-15, mask of choice, humidifier, supplies, AirView/ card  Script sent increasing Trazodone  to 100 mg tab- ok to use 1 or a fractional tab for sleep as needed,

## 2023-08-02 ENCOUNTER — Ambulatory Visit (HOSPITAL_COMMUNITY): Attending: Family Medicine | Admitting: Physical Therapy

## 2023-08-02 DIAGNOSIS — Z483 Aftercare following surgery for neoplasm: Secondary | ICD-10-CM | POA: Insufficient documentation

## 2023-08-02 DIAGNOSIS — Z7409 Other reduced mobility: Secondary | ICD-10-CM | POA: Diagnosis not present

## 2023-08-02 DIAGNOSIS — M25611 Stiffness of right shoulder, not elsewhere classified: Secondary | ICD-10-CM | POA: Insufficient documentation

## 2023-08-02 DIAGNOSIS — M25511 Pain in right shoulder: Secondary | ICD-10-CM | POA: Diagnosis not present

## 2023-08-02 NOTE — Therapy (Signed)
 OUTPATIENT PHYSICAL THERAPY UPPER EXTREMITY TREATMENT   Patient Name: Michelle Sherman MRN: [id number] DOB:[birth year], 73 y.o., female Today's Date: 08/02/2023  END OF SESSION:  PT End of Session - 08/02/23 1248     Visit Number 2    Number of Visits 12    Date for PT Re-Evaluation 08/15/23    Authorization Type [employer] Medicare    Authorization Time Period Auth requested    PT Start Time 1152    PT Stop Time 1230    PT Time Calculation (min) 38 min    Activity Tolerance Patient tolerated treatment well    Behavior During Therapy [facility] for tasks assessed/performed           Past Medical History:  Diagnosis Date   Acquired trigger finger of left middle finger 04/11/2021   Acquired trigger finger of right index finger 04/11/2021   Acquired trigger finger of right middle finger 04/11/2021   Allergy    ANA positive 08/16/2021   Arthritis    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 lfts in 100s, resolved with cessation of lipitor. Normal liver ultrasound   Elevated CK 08/15/2021   Family history of adverse reaction to anesthesia    sister, hard to wake up    Family history of lung cancer    Family history of pancreatic cancer    Family history of pancreatic cancer    Genetic testing 10/06/2019   Negative genetic testing:  No pathogenic variants detected on the [facility] Breast Cancer STAT Panel + Common Hereditary Cancers Panel. 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 The report date is 10/05/2019.     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 I discussed option of draining seroma and ways to do this as well as risks.  I discussed that draining would likely make her breast less heavy feeling.  I reviewed that drainage would likely make the right breast smaller and the nipple point inferiorly.  I discussed that the seroma could recur and we would be in    Severe obesity (BMI 35.0-35.9 with comorbidity) (HCC) 11/01/2016   Sleep apnea    wears CPAP nightly   Thyroid  disease    Ulcerative colitis [facility])    Past Surgical History:  Procedure Laterality Date   BREAST LUMPECTOMY Right 10/2019   BREAST LUMPECTOMY WITH RADIOACTIVE SEED AND SENTINEL LYMPH NODE BIOPSY Right 10/14/2019   Procedure: RIGHT BREAST LUMPECTOMY WITH RADIOACTIVE SEED AND SENTINEL LYMPH NODE BIOPSY;  Surgeon: Aron,  Jina, MD;  Location: [facility];  Service: General;  Laterality: Right;   CHOLECYSTECTOMY     RE-EXCISION OF BREAST LUMPECTOMY Right 11/11/2019   Procedure: RIGHT RE-EXCISION OF BREAST LUMPECTOMY;  Surgeon: Aron Jina, MD;  Location: [facility];  Service: General;  Laterality: Right;  RNFA   SKIN SURGERY  12/2015   Fatty tumor removed   TONSILLECTOMY AND ADENOIDECTOMY  1956   Patient Active Problem List   Diagnosis Date Noted   Breast cancer (HCC) 09/30/2022   Type 2 diabetes mellitus with complications (HCC) 03/18/2022   Need for  tetanus booster 03/18/2022   Annual physical exam 03/18/2022   Obesity, Class II, BMI 35-39.9, isolated (see actual BMI) 12/31/2021   Rash 12/31/2021   Bilateral carpal tunnel syndrome 04/12/2021   Hand arthritis 01/27/2021   Arthritis of hand 01/27/2021   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   Genetic testing 10/06/2019   Hypertension associated with diabetes (HCC) 11/01/2016   Hyperlipidemia associated with type 2 diabetes mellitus (HCC) 01/15/2012   Allergic rhinitis 10/05/2011   Ulcerative colitis without complications (HCC) 10/05/2011   Acquired hypothyroidism 10/04/2010   Osteoarthrosis of knee 10/04/2010   OSA on CPAP 06/28/2010    PCP: Duanne Butler DASEN, MD  REFERRING PROVIDER: Duanne Butler DASEN, MD  REFERRING DIAG: M75.91 (ICD-10-CM) - Right supraspinatus tendinitis  THERAPY DIAG:  Decreased ROM of right shoulder  Acute pain of right shoulder  Impaired functional mobility and endurance  Aftercare following surgery for neoplasm  Rationale for Evaluation and Treatment: Rehabilitation  ONSET DATE: ~2 months   SUBJECTIVE:                                                                                                                                                                                      SUBJECTIVE STATEMENT: Pt reports compliance with HEP.  States she's not hurting at all currently just with certain activities/movements.  When she does hurt it's anterior Rt shoulder  Evaluation:  Patient reports she does not think there was a specific incident that started this pain. Biggest issue recently is that the pain wakes her up at night, reporting her arm feels like its sleep, that static-like feeling and travels down to wrist sometimes. Reports issues with pouring a gallon a milk some days. Reports shoulder pain is more in front of the shoulder, and sharp. Has to help husband physically as he is a bilateral BKA with contractures. Reports he can help some but provides mod/max  A. Should be getting hoyer lift soon.  Hand dominance: Right  PERTINENT HISTORY: Hx of Breast Cancer 2021 Previous L shoulder issue/pain   PAIN:  Are you  having pain? Yes: NPRS scale: 2-3/10 Pain location: Front of shoulder, intermittently traveling down to elbow,  to wrist at night Pain description: Sharp, Numbness/tingling Aggravating factors: Lifting, flexion, abduction Relieving factors: Rest  PRECAUTIONS: None  RED FLAGS: None   WEIGHT BEARING RESTRICTIONS: No  FALLS:  Has patient fallen in last 6 months? No  LIVING ENVIRONMENT: Lives with: lives with their spouse Caregiver for spouse (see subjective)  OCCUPATION: Retired, Clinical biochemist  PLOF: Independent  PATIENT GOALS: To figure out some way to help the pain and stop the pain at night  NEXT MD VISIT: Maybe in two weeks   OBJECTIVE:  Note: Objective measures were completed at Evaluation unless otherwise noted.  DIAGNOSTIC FINDINGS:  N/A  PATIENT SURVEYS :  QuickDASH Score: 61.4 / 100 = 61.4 %  COGNITION: Overall cognitive status: Within functional limits for tasks assessed     SENSATION: WFL  POSTURE: Rounded Shoulders/increased kyphosis   UPPER EXTREMITY ROM:   Active ROM Right eval Left eval  Shoulder flexion 140, * at mid-range of motion AROM and PROM WNL  Shoulder extension    Shoulder abduction 135, * at mid-range of motion AROM and PROM [facility]  Shoulder adduction    Shoulder internal rotation    Shoulder external rotation    Elbow flexion    Elbow extension    Wrist flexion    Wrist extension    Wrist ulnar deviation    Wrist radial deviation    Wrist pronation    Wrist supination    (Blank rows = not tested)   *painful  UPPER EXTREMITY MMT:  MMT Right eval Left eval  Shoulder flexion    Shoulder extension    Shoulder abduction    Shoulder adduction    Shoulder internal rotation    Shoulder external rotation    Middle trapezius    Lower trapezius    Elbow flexion     Elbow extension    Wrist flexion    Wrist extension    Wrist ulnar deviation    Wrist radial deviation    Wrist pronation    Wrist supination    Grip strength (lbs)    (Blank rows = not tested)  SHOULDER SPECIAL TESTS: Impingement tests: Hawkins/Kennedy impingement test: positive  SLAP lesions:  Instability tests: Apprehension test: positive  (Modified: Pt in ~45 deg abd + ER w/ pain, improved with AP pressure) Rotator cuff assessment:  Biceps assessment: Speed's test: positive   JOINT MOBILITY TESTING:  Gentle long axis distraction of GH joint inferiorly, no excessive joint motion noted this date, pt reporting light stretch sensation but no excessive discomfort    PALPATION:  Discomfort with palpation around [facility] joint, and inferiorly around region of coracoid process.  Mild Tightness noted and pt reporting comfort during scapular mobility, pt in L side-lying position  TREATMENT DATE:  08/02/23 Cervical ROM WNL Goal and HEP review Seated:  scap retraction 10X  Bil hip abduction (jumping jacks) 10X Supine:  1# cane assisted flexion 10X  1# push up 10X  1#AAROM ER 10X Side lying:  Rt UE abduction 10X  Rt UE ER 10X Manual Rt anterior shoulder STM   07/18/23: PT eval and HEP   PATIENT EDUCATION: Education details: PT evaluation, objective findings, POC, Importance of HEP, Precautions, Clinic policies  Person educated: Patient Education method: Explanation and Demonstration Education comprehension: verbalized understanding and returned demonstration  HOME EXERCISE PROGRAM: Access Code: [email or web address] URL: [email or web address] Date: 07/18/2023 Prepared by: Rosaria Powell-Butler  Exercises - Supine Shoulder Abduction AROM  - 2 x daily - 7 x weekly - 3 sets - 10 reps - Seated Scapular Retraction  - 2 x daily - 7 x weekly - 3 sets - 10 reps -  10 hold - Sidelying Shoulder External Rotation  - 2 x daily - 7 x weekly - 3 sets - 10 reps  ASSESSMENT:  CLINICAL IMPRESSION: Goals reviewed and POC moving forward.  Cervical ROM is WNL.  Continued with focus on improving Rt UE strength and function within pain free ROM.  Pt with most pain when completing descending motion in eccentric phase of flexion and also with ER.  Manual completed to anterior shoulder with pt reporting overall improvement following. No new exercises added to HEP this session.  Overall tolerated well.  Patient will benefit from continued skilled physical therapy in order to address the above/below to improve function/QOL.    OBJECTIVE IMPAIRMENTS: decreased activity tolerance, decreased ROM, decreased strength, increased muscle spasms, impaired flexibility, impaired UE functional use, improper body mechanics, postural dysfunction, and pain.   ACTIVITY LIMITATIONS: carrying, lifting, reach over head, and caring for others  PARTICIPATION LIMITATIONS: meal prep, cleaning, laundry, and Caring for husband  PERSONAL FACTORS: N/A are also affecting patient's functional outcome.   REHAB POTENTIAL: Good  CLINICAL DECISION MAKING: Stable/uncomplicated  EVALUATION COMPLEXITY: Low  GOALS: Goals reviewed with patient? No  SHORT TERM GOALS: Target date: 08/01/23 Patient will be independent with performance of HEP to demonstrate adequate self management of symptoms.  Baseline:  Goal status: INITIAL  2.   Patient will report at least a 25% improvement with function or pain overall since beginning PT. Baseline:  Goal status: INITIAL LONG TERM GOALS: Target date: 08/29/23  Patient will report a consistent 2/10 or lower pain rating with daily functional activities in order to demonstrate improved activity tolerance.  Baseline:  Goal status: INITIAL  2.  Patient will achieve at least 160 deg flexion with RUE in order to be able to reach overhead without increased  pain. Baseline:  Goal status: INITIAL  3.   Patient will achieve at least 170 deg flexion with RUE in order to be able to reach overhead without increased pain. Baseline:  Goal status: INITIAL  4.  Patient will improve QuickDASH score by at least 14 percent to achieve MCID and demonstrate improved overall function.  Baseline:  Goal status: INITIAL  5.   Patient will report at least a 50% improvement with function or pain overall since beginning PT. Baseline:  Goal status:   PLAN: PT FREQUENCY: 2x/week  PT DURATION: 6 weeks  PLANNED INTERVENTIONS: [id number]- PT Re-evaluation, 97110-Therapeutic exercises, 97530- Therapeutic activity, [id number]- Neuromuscular re-education, 97535- Self Care, [id number]- Manual therapy, [id number]- Gait training, [phone]- Electrical stimulation (manual), [phone] (1-2 muscles), [id number] (3+ muscles)- Dry Needling, Patient/Family education, Taping, Joint mobilization, Spinal  mobilization, Cryotherapy, and Moist heat  PLAN FOR NEXT SESSION: continue to improve postural strengthening, practice caregiver  body mechanics, manual: scapular mobs, GH mobs, distraction    12:50 PM, 08/02/23 Greig KATHEE Fuse, PTA/CLT [facility] Ph: [phone]

## 2023-08-08 ENCOUNTER — Ambulatory Visit (HOSPITAL_COMMUNITY)

## 2023-08-08 DIAGNOSIS — Z7409 Other reduced mobility: Secondary | ICD-10-CM

## 2023-08-08 DIAGNOSIS — M25611 Stiffness of right shoulder, not elsewhere classified: Secondary | ICD-10-CM

## 2023-08-08 DIAGNOSIS — M25511 Pain in right shoulder: Secondary | ICD-10-CM | POA: Diagnosis not present

## 2023-08-08 DIAGNOSIS — Z483 Aftercare following surgery for neoplasm: Secondary | ICD-10-CM

## 2023-08-08 NOTE — Therapy (Signed)
 OUTPATIENT PHYSICAL THERAPY UPPER EXTREMITY TREATMENT   Patient Name: Michelle Sherman MRN: [id number] DOB:1950-07-10, 73 y.o., female Today's Date: 08/08/2023  END OF SESSION:  PT End of Session - 08/08/23 1110     Visit Number 3    Number of Visits 12    Date for PT Re-Evaluation 08/15/23    Authorization Type [employer] Medicare    Authorization Time Period [employer] approved 12 visits from 07/18/2023-08/29/2023    PT Start Time 1106    PT Stop Time 1144    PT Time Calculation (min) 38 min    Activity Tolerance Patient tolerated treatment well    Behavior During Therapy [facility] for tasks assessed/performed           Past Medical History:  Diagnosis Date   Acquired trigger finger of left middle finger 04/11/2021   Acquired trigger finger of right index finger 04/11/2021   Acquired trigger finger of right middle finger 04/11/2021   Allergy    ANA positive 08/16/2021   Arthritis    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 lfts in 100s, resolved with cessation of lipitor. Normal liver ultrasound   Elevated CK 08/15/2021   Family history of adverse reaction to anesthesia    sister, hard to wake up    Family history of lung cancer    Family history of pancreatic cancer    Family history of pancreatic cancer    Genetic testing 10/06/2019   Negative genetic testing:  No pathogenic variants detected on the [facility] Breast Cancer STAT Panel + Common Hereditary Cancers Panel. 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 The report date is 10/05/2019.     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 I discussed option of draining seroma and ways to do this as well as risks.  I discussed that draining would likely make her breast less heavy feeling.  I reviewed that drainage would likely make the right breast smaller and the nipple point inferiorly.  I discussed that the seroma could recur and we would be in    Severe obesity (BMI 35.0-35.9 with comorbidity) (HCC) 11/01/2016   Sleep apnea    wears CPAP nightly   Thyroid  disease    Ulcerative colitis [facility])    Past Surgical History:  Procedure Laterality Date   BREAST LUMPECTOMY Right 10/2019   BREAST LUMPECTOMY WITH RADIOACTIVE SEED AND SENTINEL LYMPH NODE BIOPSY Right 10/14/2019   Procedure: RIGHT BREAST LUMPECTOMY WITH RADIOACTIVE SEED AND SENTINEL LYMPH NODE  BIOPSY;  Surgeon: Aron Shoulders, MD;  Location: [facility];  Service: General;  Laterality: Right;   CHOLECYSTECTOMY     RE-EXCISION OF BREAST LUMPECTOMY Right 11/11/2019   Procedure: RIGHT RE-EXCISION OF BREAST LUMPECTOMY;  Surgeon: Aron Shoulders, MD;  Location: [facility];  Service: General;  Laterality: Right;  RNFA   SKIN SURGERY  12/2015   Fatty tumor removed   TONSILLECTOMY AND ADENOIDECTOMY  1956   Patient Active Problem List   Diagnosis Date Noted   Breast cancer (HCC) 09/30/2022   Type 2 diabetes mellitus with complications (HCC)  03/18/2022   Need for tetanus booster 03/18/2022   Annual physical exam 03/18/2022   Obesity, Class II, BMI 35-39.9, isolated (see actual BMI) 12/31/2021   Rash 12/31/2021   Bilateral carpal tunnel syndrome 04/12/2021   Hand arthritis 01/27/2021   Arthritis of hand 01/27/2021   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   Genetic testing 10/06/2019   Hypertension associated with diabetes (HCC) 11/01/2016   Hyperlipidemia associated with type 2 diabetes mellitus (HCC) 01/15/2012   Allergic rhinitis 10/05/2011   Ulcerative colitis without complications (HCC) 10/05/2011   Acquired hypothyroidism 10/04/2010   Osteoarthrosis of knee 10/04/2010   OSA on CPAP 06/28/2010    PCP: Duanne Butler DASEN, MD  REFERRING PROVIDER: Duanne Butler DASEN, MD  REFERRING DIAG: M75.91 (ICD-10-CM) - Right supraspinatus tendinitis  THERAPY DIAG:  Decreased ROM of right shoulder  Acute pain of right shoulder  Impaired functional mobility and endurance  Aftercare following surgery for neoplasm  Rationale for Evaluation and Treatment: Rehabilitation  ONSET DATE: ~2 months   SUBJECTIVE:                                                                                                                                                                                      SUBJECTIVE STATEMENT: Pt reports her shoulder has its good days and bad days. Today, pain is only 2/10. Reports she isn't as compliant with HEP.   Evaluation:  Patient reports she does not think there was a specific incident that started this pain. Biggest issue recently is that the pain wakes her up at night, reporting her arm feels like its sleep, that static-like feeling and travels down to wrist sometimes. Reports issues with pouring a gallon a milk some days. Reports shoulder pain is more in front of the shoulder, and sharp. Has to help husband physically as he is a bilateral BKA with contractures. Reports he can help some but provides mod/max A. Should be  getting hoyer lift soon.  Hand dominance: Right  PERTINENT HISTORY: Hx of Breast Cancer 2021 Previous L shoulder issue/pain   PAIN:  Are you having pain? Yes: NPRS scale: 2-3/10 Pain location: Front of shoulder, intermittently traveling down to elbow,  to wrist at night Pain description: Sharp, Numbness/tingling Aggravating factors: Lifting, flexion, abduction Relieving factors: Rest  PRECAUTIONS: None  RED FLAGS: None   WEIGHT BEARING RESTRICTIONS: No  FALLS:  Has patient fallen in last 6 months? No  LIVING ENVIRONMENT: Lives with: lives with their spouse Caregiver for spouse (see subjective)  OCCUPATION: Retired, Clinical biochemist  PLOF: Independent  PATIENT GOALS: To figure out some way to help the pain and stop the pain at night  NEXT MD VISIT: Maybe in two weeks   OBJECTIVE:  Note: Objective measures were completed at Evaluation unless otherwise noted.  DIAGNOSTIC FINDINGS:  N/A  PATIENT SURVEYS :  QuickDASH Score: 61.4 / 100 = 61.4 %  COGNITION: Overall cognitive status: Within functional limits for tasks assessed     SENSATION: WFL  POSTURE: Rounded Shoulders/increased kyphosis   UPPER EXTREMITY ROM:   Active ROM Right eval Left eval  Shoulder flexion 140, * at mid-range of motion AROM and PROM WNL  Shoulder extension    Shoulder abduction 135, * at mid-range of motion AROM and PROM [facility]  Shoulder adduction    Shoulder internal rotation    Shoulder external rotation    Elbow flexion    Elbow extension    Wrist flexion    Wrist extension    Wrist ulnar deviation    Wrist radial deviation    Wrist pronation    Wrist supination    (Blank rows = not tested)   *painful  UPPER EXTREMITY MMT:  MMT Right eval Left eval  Shoulder flexion    Shoulder extension    Shoulder abduction    Shoulder adduction    Shoulder internal rotation    Shoulder external rotation    Middle trapezius    Lower trapezius    Elbow flexion    Elbow  extension    Wrist flexion    Wrist extension    Wrist ulnar deviation    Wrist radial deviation    Wrist pronation    Wrist supination    Grip strength (lbs)    (Blank rows = not tested)  SHOULDER SPECIAL TESTS: Impingement tests: Hawkins/Kennedy impingement test: positive  SLAP lesions:  Instability tests: Apprehension test: positive  (Modified: Pt in ~45 deg abd + ER w/ pain, improved with AP pressure) Rotator cuff assessment:  Biceps assessment: Speed's test: positive   JOINT MOBILITY TESTING:  Gentle long axis distraction of GH joint inferiorly, no excessive joint motion noted this date, pt reporting light stretch sensation but no excessive discomfort    PALPATION:  Discomfort with palpation around [facility] joint, and inferiorly around region of coracoid process.  Mild Tightness noted and pt reporting comfort during scapular mobility, pt in L side-lying position  TREATMENT DATE:  08/08/23: UBE, level 1, 4', 2' forward, 2'  backward  Manual Rt anterior shoulder STM Supine:  2# cane assisted flexion 10X  2# AAROM ER 10X Seated Bicep curls, 2 lb., 2x10  Supination/pronation, 2 lb., UE rested on step, 2x10 Standing ER/IR, YTB, 15x   08/02/23 Cervical ROM WNL Goal and HEP review Seated:  scap retraction 10X  Bil hip abduction (jumping jacks) 10X Supine:  1# cane assisted flexion 10X  1# push up 10X  1#AAROM ER 10X Side lying:  Rt UE abduction 10X  Rt UE ER 10X Manual Rt anterior shoulder STM   07/18/23: PT eval and HEP   PATIENT EDUCATION: Education details: PT evaluation, objective findings, POC, Importance of HEP, Precautions, Clinic policies  Person educated: Patient Education method: Explanation and Demonstration Education comprehension: verbalized understanding and returned demonstration  HOME EXERCISE PROGRAM: Access Code: [email or web address] URL:  [email or web address] Date: 07/18/2023 Prepared by: Rosaria Powell-Butler  Exercises - Supine Shoulder Abduction AROM  - 2 x daily - 7 x weekly - 3 sets - 10 reps - Seated Scapular Retraction  - 2 x daily - 7 x weekly - 3 sets - 10 reps - 10 hold - Sidelying Shoulder External Rotation  - 2 x daily - 7 x weekly - 3 sets - 10 reps  ASSESSMENT:  CLINICAL IMPRESSION: Session began on UBE forward and backward for 2 minutes each for general warm up. Followed with STM at/around bicipital insertion. Patient with discomfort initially but reports improvements with focusing on relaxation. During palpation, difference can be felt between R and L anterior aspects of shoulder musculature. Possible degenerative changes? Remainder of session spent with functional mobility as well as L shoulder/UE strengthening. Pt with continued discomfort around 40 deg of shoulder flexion,  at max ER, and with inc discomfort throughout eccentric portions of motion.  Patient will benefit from continued skilled physical therapy in order to address the above/below to improve function/QOL.     OBJECTIVE IMPAIRMENTS: decreased activity tolerance, decreased ROM, decreased strength, increased muscle spasms, impaired flexibility, impaired UE functional use, improper body mechanics, postural dysfunction, and pain.   ACTIVITY LIMITATIONS: carrying, lifting, reach over head, and caring for others  PARTICIPATION LIMITATIONS: meal prep, cleaning, laundry, and Caring for husband  PERSONAL FACTORS: N/A are also affecting patient's functional outcome.   REHAB POTENTIAL: Good  CLINICAL DECISION MAKING: Stable/uncomplicated  EVALUATION COMPLEXITY: Low  GOALS: Goals reviewed with patient? No  SHORT TERM GOALS: Target date: 08/01/23 Patient will be independent with performance of HEP to demonstrate adequate self management of symptoms.  Baseline:  Goal status: INITIAL  2.   Patient will report at least a 25% improvement  with function or pain overall since beginning PT. Baseline:  Goal status: INITIAL LONG TERM GOALS: Target date: 08/29/23  Patient will report a consistent 2/10 or lower pain rating with daily functional activities in order to demonstrate improved activity tolerance.  Baseline:  Goal status: INITIAL  2.  Patient will achieve at least 160 deg flexion with RUE in order to be able to reach overhead without increased pain. Baseline:  Goal status: INITIAL  3.   Patient will achieve at least 170 deg flexion with RUE in order to be able to reach overhead without increased pain. Baseline:  Goal status: INITIAL  4.  Patient will improve QuickDASH score by at least 14 percent to achieve MCID and demonstrate improved overall function.  Baseline:  Goal status: INITIAL  5.   Patient will report at  least a 50% improvement with function or pain overall since beginning PT. Baseline:  Goal status:   PLAN: PT FREQUENCY: 2x/week  PT DURATION: 6 weeks  PLANNED INTERVENTIONS: [id number]- PT Re-evaluation, 97110-Therapeutic exercises, 97530- Therapeutic activity, 97112- Neuromuscular re-education, 97535- Self Care, [id number]- Manual therapy, [phone]- Gait training, [phone]- Electrical stimulation (manual), [phone] (1-2 muscles), [phone] (3+ muscles)- Dry Needling, Patient/Family education, Taping, Joint mobilization, Spinal mobilization, Cryotherapy, and Moist heat  PLAN FOR NEXT SESSION: continue to improve postural strengthening, practice caregiver  body mechanics, manual: scapular mobs, GH mobs, distraction   11:54 AM, 08/08/23 Rosaria Settler, PT, DPT [facility] [place]

## 2023-08-10 ENCOUNTER — Encounter (HOSPITAL_COMMUNITY)

## 2023-08-11 ENCOUNTER — Encounter: Payer: Self-pay | Admitting: Family Medicine

## 2023-08-12 ENCOUNTER — Encounter: Payer: Self-pay | Admitting: Family Medicine

## 2023-08-13 ENCOUNTER — Other Ambulatory Visit: Payer: Self-pay

## 2023-08-13 MED ORDER — RAMIPRIL 10 MG PO CAPS: 10.0000 mg | ORAL_CAPSULE | Freq: Every day | ORAL | 3 refills | Status: AC

## 2023-08-13 MED ORDER — EMPAGLIFLOZIN 25 MG PO TABS: 25.0000 mg | ORAL_TABLET | Freq: Every day | ORAL | 3 refills | Status: AC

## 2023-08-15 ENCOUNTER — Encounter (HOSPITAL_COMMUNITY)

## 2023-08-19 ENCOUNTER — Encounter: Payer: Self-pay | Admitting: Family Medicine

## 2023-08-19 ENCOUNTER — Encounter: Payer: Self-pay | Admitting: Internal Medicine

## 2023-08-20 ENCOUNTER — Other Ambulatory Visit: Payer: Self-pay

## 2023-08-20 ENCOUNTER — Ambulatory Visit (HOSPITAL_COMMUNITY): Admitting: Physical Therapy

## 2023-08-20 DIAGNOSIS — Z483 Aftercare following surgery for neoplasm: Secondary | ICD-10-CM | POA: Diagnosis not present

## 2023-08-20 DIAGNOSIS — M25511 Pain in right shoulder: Secondary | ICD-10-CM | POA: Diagnosis not present

## 2023-08-20 DIAGNOSIS — Z7409 Other reduced mobility: Secondary | ICD-10-CM | POA: Diagnosis not present

## 2023-08-20 DIAGNOSIS — M25611 Stiffness of right shoulder, not elsewhere classified: Secondary | ICD-10-CM | POA: Diagnosis not present

## 2023-08-20 MED ORDER — EZETIMIBE 10 MG PO TABS: 10.0000 mg | ORAL_TABLET | Freq: Every day | ORAL | 3 refills | Status: AC

## 2023-08-20 MED ORDER — FAMOTIDINE 40 MG PO TABS: 40.0000 mg | ORAL_TABLET | Freq: Two times a day (BID) | ORAL | 0 refills | Status: DC

## 2023-08-20 MED ORDER — MESALAMINE 1.2 G PO TBEC: 2.4000 g | DELAYED_RELEASE_TABLET | Freq: Two times a day (BID) | ORAL | 0 refills | Status: DC

## 2023-08-20 NOTE — Therapy (Signed)
 OUTPATIENT PHYSICAL THERAPY UPPER EXTREMITY TREATMENT   Patient Name: Michelle Sherman MRN: [id number] DOB:11-25-1950, 73 y.o., female Today's Date: 08/20/2023  END OF SESSION:  PT End of Session - 08/20/23 1451     Visit Number 4    Number of Visits 12    Date for PT Re-Evaluation 08/15/23    Authorization Type [employer] Medicare    Authorization Time Period UHC approved 12 visits from 07/18/2023-08/29/2023    PT Start Time 1150    PT Stop Time 1230    PT Time Calculation (min) 40 min    Activity Tolerance Patient tolerated treatment well    Behavior During Therapy [facility] for tasks assessed/performed            Past Medical History:  Diagnosis Date   Acquired trigger finger of left middle finger 04/11/2021   Acquired trigger finger of right index finger 04/11/2021   Acquired trigger finger of right middle finger 04/11/2021   Allergy    ANA positive 08/16/2021   Arthritis    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 lfts in 100s, resolved with cessation of lipitor. Normal liver ultrasound   Elevated CK 08/15/2021   Family history of adverse reaction to anesthesia    sister, hard to wake up    Family history of lung cancer    Family history of pancreatic cancer    Family history of pancreatic cancer    Genetic testing 10/06/2019   Negative genetic testing:  No pathogenic variants detected on the [facility] Breast Cancer STAT Panel + Common Hereditary Cancers Panel. 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 The report date is 10/05/2019.     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 I discussed option of draining seroma and ways to do this as well as risks.  I discussed that draining would likely make her breast less heavy feeling.  I reviewed that drainage would likely make the right breast smaller and the nipple point inferiorly.  I discussed that the seroma could recur and we would be in    Severe obesity (BMI 35.0-35.9 with comorbidity) (HCC) 11/01/2016   Sleep apnea    wears CPAP nightly   Thyroid  disease    Ulcerative colitis [facility])    Past Surgical History:  Procedure Laterality Date   BREAST LUMPECTOMY Right 10/2019   BREAST LUMPECTOMY WITH RADIOACTIVE SEED AND SENTINEL LYMPH NODE BIOPSY Right 10/14/2019   Procedure: RIGHT BREAST LUMPECTOMY WITH RADIOACTIVE SEED AND SENTINEL LYMPH  NODE BIOPSY;  Surgeon: Aron Shoulders, MD;  Location: [facility];  Service: General;  Laterality: Right;   CHOLECYSTECTOMY     RE-EXCISION OF BREAST LUMPECTOMY Right 11/11/2019   Procedure: RIGHT RE-EXCISION OF BREAST LUMPECTOMY;  Surgeon: Aron Shoulders, MD;  Location: [facility];  Service: General;  Laterality: Right;  RNFA   SKIN SURGERY  12/2015   Fatty tumor removed   TONSILLECTOMY AND ADENOIDECTOMY  1956   Patient Active Problem List   Diagnosis Date Noted   Breast cancer (HCC) 09/30/2022   Type 2 diabetes mellitus with complications (HCC)  03/18/2022   Need for tetanus booster 03/18/2022   Annual physical exam 03/18/2022   Obesity, Class II, BMI 35-39.9, isolated (see actual BMI) 12/31/2021   Rash 12/31/2021   Bilateral carpal tunnel syndrome 04/12/2021   Hand arthritis 01/27/2021   Arthritis of hand 01/27/2021   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   Genetic testing 10/06/2019   Hypertension associated with diabetes (HCC) 11/01/2016   Hyperlipidemia associated with type 2 diabetes mellitus (HCC) 01/15/2012   Allergic rhinitis 10/05/2011   Ulcerative colitis without complications (HCC) 10/05/2011   Acquired hypothyroidism 10/04/2010   Osteoarthrosis of knee 10/04/2010   OSA on CPAP 06/28/2010    PCP: Duanne Butler DASEN, MD  REFERRING PROVIDER: Duanne Butler DASEN, MD  REFERRING DIAG: M75.91 (ICD-10-CM) - Right supraspinatus tendinitis  THERAPY DIAG:  Decreased ROM of right shoulder  Acute pain of right shoulder  Impaired functional mobility and endurance  Aftercare following surgery for neoplasm  Rationale for Evaluation and Treatment: Rehabilitation  ONSET DATE: ~2 months   SUBJECTIVE:                                                                                                                                                                                      SUBJECTIVE STATEMENT: Pt reports pain has moved more on top of her shoulder rather than anteriorly.  Reports she intends to contact her MD to see next step/plan as she feels she has not made the improvements she would like. Today, pain is only 2/10. Reports she isn't as compliant with HEP.   Evaluation:  Patient reports she does not think there was a specific incident that started this pain. Biggest issue recently is that the pain wakes her up at night, reporting her arm feels like its sleep, that static-like feeling and travels down to wrist sometimes. Reports issues with pouring a gallon a milk some days. Reports shoulder pain is more in front of the shoulder,  and sharp. Has to help husband physically as he is a bilateral BKA with contractures. Reports he can help some but provides  mod/max A. Should be getting hoyer lift soon.  Hand dominance: Right  PERTINENT HISTORY: Hx of Breast Cancer 2021 Previous L shoulder issue/pain   PAIN:  Are you having pain? Yes: NPRS scale: 2-3/10 Pain location: Front of shoulder, intermittently traveling down to elbow,  to wrist at night Pain description: Sharp, Numbness/tingling Aggravating factors: Lifting, flexion, abduction Relieving factors: Rest  PRECAUTIONS: None  RED FLAGS: None   WEIGHT BEARING RESTRICTIONS: No  FALLS:  Has patient fallen in last 6 months? No  LIVING ENVIRONMENT: Lives with: lives with their spouse Caregiver for spouse (see subjective)  OCCUPATION: Retired, Clinical biochemist  PLOF: Independent  PATIENT GOALS: To figure out some way to help the pain and stop the pain at night  NEXT MD VISIT: Maybe in two weeks   OBJECTIVE:  Note: Objective measures were completed at Evaluation unless otherwise noted.  DIAGNOSTIC FINDINGS:  N/A  PATIENT SURVEYS :  QuickDASH Score: 61.4 / 100 = 61.4 %  COGNITION: Overall cognitive status: Within functional limits for tasks assessed     SENSATION: WFL  POSTURE: Rounded Shoulders/increased kyphosis   UPPER EXTREMITY ROM:   Active ROM Right eval Left eval  Shoulder flexion 140, * at mid-range of motion AROM and PROM WNL  Shoulder extension    Shoulder abduction 135, * at mid-range of motion AROM and PROM [facility]  Shoulder adduction    Shoulder internal rotation    Shoulder external rotation    Elbow flexion    Elbow extension    Wrist flexion    Wrist extension    Wrist ulnar deviation    Wrist radial deviation    Wrist pronation    Wrist supination    (Blank rows = not tested)   *painful  UPPER EXTREMITY MMT:  MMT Right eval Left eval  Shoulder flexion    Shoulder extension    Shoulder abduction    Shoulder  adduction    Shoulder internal rotation    Shoulder external rotation    Middle trapezius    Lower trapezius    Elbow flexion    Elbow extension    Wrist flexion    Wrist extension    Wrist ulnar deviation    Wrist radial deviation    Wrist pronation    Wrist supination    Grip strength (lbs)    (Blank rows = not tested)  SHOULDER SPECIAL TESTS: Impingement tests: Hawkins/Kennedy impingement test: positive  SLAP lesions:  Instability tests: Apprehension test: positive  (Modified: Pt in ~45 deg abd + ER w/ pain, improved with AP pressure) Rotator cuff assessment:  Biceps assessment: Speed's test: positive   JOINT MOBILITY TESTING:  Gentle long axis distraction of GH joint inferiorly, no excessive joint motion noted this date, pt reporting light stretch sensation but no excessive discomfort    PALPATION:  Discomfort with palpation around [facility] joint, and inferiorly around region of coracoid process.  Mild Tightness noted and pt reporting comfort during scapular mobility, pt in L side-lying position  TREATMENT DATE:  08/20/23: UBE, level 1, 4', 2' forward, 2'  backward  Seated with feet on 6 step and pillow at lower back for posture Rt empty can for supraspinatus 1# 2X10 Rt Bicep curls 2#  2x10 Rt Hammer curls 2# 2X10  Rt Supination/pronation 2# 2x10  Rt ER/IR 2# 2X10 Standing ER/IR, YTB, 15x Manual Rt anterior shoulder STM, PROM for all directions, MFR   08/08/23: UBE, level 1, 4', 2' forward, 2'  backward  Manual Rt anterior shoulder STM Supine:  2# cane assisted flexion 10X  2# AAROM ER 10X Seated Bicep curls, 2 lb., 2x10  Supination/pronation, 2 lb., UE rested on step, 2x10 Standing ER/IR, YTB, 15x   08/02/23 Cervical ROM WNL Goal and HEP review Seated:  scap retraction 10X  Bil hip abduction (jumping jacks) 10X Supine:  1# cane assisted  flexion 10X  1# push up 10X  1#AAROM ER 10X Side lying:  Rt UE abduction 10X  Rt UE ER 10X Manual Rt anterior shoulder STM   07/18/23: PT eval and HEP   PATIENT EDUCATION: Education details: PT evaluation, objective findings, POC, Importance of HEP, Precautions, Clinic policies  Person educated: Patient Education method: Explanation and Demonstration Education comprehension: verbalized understanding and returned demonstration  HOME EXERCISE PROGRAM: Access Code: [email or web address] URL: [email or web address] Date: 07/18/2023 Prepared by: Rosaria Powell-Butler  Exercises - Supine Shoulder Abduction AROM  - 2 x daily - 7 x weekly - 3 sets - 10 reps - Seated Scapular Retraction  - 2 x daily - 7 x weekly - 3 sets - 10 reps - 10 hold - Sidelying Shoulder External Rotation  - 2 x daily - 7 x weekly - 3 sets - 10 reps  ASSESSMENT:  CLINICAL IMPRESSION: Continued with focus on Rt UE strengthening and reducing pain.  Attempted seated cane flexion, however too much pain with eccentric phase around 90 degrees.   Additional Rt UE strengthening exercises added today without pain or difficulty.  Continued with STM and myofascial techniques at/around bicipital insertion. PROM completed with increased tolerance in all directions following manual.  Pt will benefit from continued skilled physical therapy in order to address deficits and pain.      OBJECTIVE IMPAIRMENTS: decreased activity tolerance, decreased ROM, decreased strength, increased muscle spasms, impaired flexibility, impaired UE functional use, improper body mechanics, postural dysfunction, and pain.   ACTIVITY LIMITATIONS: carrying, lifting, reach over head, and caring for others  PARTICIPATION LIMITATIONS: meal prep, cleaning, laundry, and Caring for husband  PERSONAL FACTORS: N/A are also affecting patient's functional outcome.   REHAB POTENTIAL: Good  CLINICAL DECISION MAKING: Stable/uncomplicated  EVALUATION COMPLEXITY:  Low  GOALS: Goals reviewed with patient? No  SHORT TERM GOALS: Target date: 08/01/23 Patient will be independent with performance of HEP to demonstrate adequate self management of symptoms.  Baseline:  Goal status: INITIAL  2.   Patient will report at least a 25% improvement with function or pain overall since beginning PT. Baseline:  Goal status: INITIAL LONG TERM GOALS: Target date: 08/29/23  Patient will report a consistent 2/10 or lower pain rating with daily functional activities in order to demonstrate improved activity tolerance.  Baseline:  Goal status: INITIAL  2.  Patient will achieve at least 160 deg flexion with RUE in order to be able to reach overhead without increased pain. Baseline:  Goal status: INITIAL  3.   Patient will achieve at least 170 deg flexion with RUE in order to be able to reach overhead without increased pain. Baseline:  Goal status: INITIAL  4.  Patient will improve QuickDASH score by at least 14 percent to achieve MCID and demonstrate improved overall function.  Baseline:  Goal status: INITIAL  5.   Patient will report at least a 50% improvement with function or pain overall since beginning PT. Baseline:  Goal status:   PLAN: PT FREQUENCY: 2x/week  PT DURATION: 6 weeks  PLANNED INTERVENTIONS: 97164- PT Re-evaluation, 97110-Therapeutic exercises, 97530- Therapeutic activity, 97112- Neuromuscular re-education, 97535- Self Care, [id number]- Manual therapy, [phone]- Gait training, [phone]- Electrical stimulation (manual), [phone] (1-2 muscles), [phone] (3+ muscles)- Dry Needling, Patient/Family education, Taping, Joint mobilization, Spinal mobilization, Cryotherapy, and Moist heat  PLAN FOR NEXT SESSION: continue to improve postural strengthening, practice caregiver  body mechanics, manual: scapular mobs, GH mobs, distraction   2:52 PM, 08/20/23 Greig KATHEE Fuse, PTA/CLT [facility] Ph: [phone]

## 2023-08-22 ENCOUNTER — Encounter (HOSPITAL_COMMUNITY): Payer: Self-pay

## 2023-08-22 ENCOUNTER — Ambulatory Visit (HOSPITAL_COMMUNITY)

## 2023-08-22 DIAGNOSIS — Z483 Aftercare following surgery for neoplasm: Secondary | ICD-10-CM | POA: Diagnosis not present

## 2023-08-22 DIAGNOSIS — M25511 Pain in right shoulder: Secondary | ICD-10-CM | POA: Diagnosis not present

## 2023-08-22 DIAGNOSIS — Z7409 Other reduced mobility: Secondary | ICD-10-CM | POA: Diagnosis not present

## 2023-08-22 DIAGNOSIS — M25611 Stiffness of right shoulder, not elsewhere classified: Secondary | ICD-10-CM

## 2023-08-22 NOTE — Therapy (Signed)
 OUTPATIENT PHYSICAL THERAPY UPPER EXTREMITY TREATMENT/PROGRESS NOTE   Patient Name: Michelle Sherman MRN: [id number] DOB:04-06-1950, 73 y.o., female Today's Date: 08/22/2023   Progress Note Reporting Period 07/18/23 to 08/22/23  See note below for Objective Data and Assessment of Progress/Goals.      END OF SESSION:  PT End of Session - 08/22/23 1104     Visit Number 5    Number of Visits 12    Date for PT Re-Evaluation 09/12/23    Authorization Type [employer] Medicare    Authorization Time Period [employer] approved 12 visits from 07/18/2023-08/29/2023    PT Start Time 1105    PT Stop Time 1143    PT Time Calculation (min) 38 min    Activity Tolerance Patient tolerated treatment well    Behavior During Therapy WFL for tasks assessed/performed            Past Medical History:  Diagnosis Date   Acquired trigger finger of left middle finger 04/11/2021   Acquired trigger finger of right index finger 04/11/2021   Acquired trigger finger of right middle finger 04/11/2021   Allergy    ANA positive 08/16/2021   Arthritis    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 lfts in 100s, resolved with cessation of lipitor. Normal liver ultrasound   Elevated CK 08/15/2021   Family history of adverse reaction to anesthesia    sister, hard to wake up    Family history of lung cancer    Family history of pancreatic cancer    Family history of pancreatic cancer    Genetic testing 10/06/2019   Negative genetic testing:  No pathogenic variants detected on the [facility] Breast Cancer STAT Panel + Common Hereditary Cancers Panel. 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 The report date is 10/05/2019.     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 I discussed option of draining seroma and ways to do this as well as risks.  I discussed that draining would likely make her breast less heavy feeling.  I reviewed that drainage would likely make the right breast smaller and the nipple point inferiorly.  I discussed that the seroma could recur and we would be in    Severe obesity (BMI 35.0-35.9 with comorbidity) (HCC) 11/01/2016   Sleep apnea    wears CPAP nightly   Thyroid  disease    Ulcerative colitis [facility])    Past Surgical History:  Procedure Laterality Date   BREAST LUMPECTOMY Right 10/2019  BREAST LUMPECTOMY WITH RADIOACTIVE SEED AND SENTINEL LYMPH NODE BIOPSY Right 10/14/2019   Procedure: RIGHT BREAST LUMPECTOMY WITH RADIOACTIVE SEED AND SENTINEL LYMPH NODE BIOPSY;  Surgeon: Aron Shoulders, MD;  Location: [facility];  Service: General;  Laterality: Right;   CHOLECYSTECTOMY     RE-EXCISION OF BREAST LUMPECTOMY Right 11/11/2019   Procedure: RIGHT RE-EXCISION OF BREAST LUMPECTOMY;  Surgeon: Aron Shoulders, MD;  Location: [facility];  Service: General;  Laterality: Right;  RNFA   SKIN SURGERY  12/2015   Fatty tumor removed   TONSILLECTOMY AND ADENOIDECTOMY   1956   Patient Active Problem List   Diagnosis Date Noted   Breast cancer (HCC) 09/30/2022   Type 2 diabetes mellitus with complications (HCC) 03/18/2022   Need for tetanus booster 03/18/2022   Annual physical exam 03/18/2022   Obesity, Class II, BMI 35-39.9, isolated (see actual BMI) 12/31/2021   Rash 12/31/2021   Bilateral carpal tunnel syndrome 04/12/2021   Hand arthritis 01/27/2021   Arthritis of hand 01/27/2021   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   Genetic testing 10/06/2019   Hypertension associated with diabetes (HCC) 11/01/2016   Hyperlipidemia associated with type 2 diabetes mellitus (HCC) 01/15/2012   Allergic rhinitis 10/05/2011   Ulcerative colitis without complications (HCC) 10/05/2011   Acquired hypothyroidism 10/04/2010   Osteoarthrosis of knee 10/04/2010   OSA on CPAP 06/28/2010    PCP: Duanne Butler DASEN, MD  REFERRING PROVIDER: Duanne Butler DASEN, MD  REFERRING DIAG: M75.91 (ICD-10-CM) - Right supraspinatus tendinitis  THERAPY DIAG:  Decreased ROM of right shoulder  Acute pain of right shoulder  Impaired functional mobility and endurance  Rationale for Evaluation and Treatment: Rehabilitation  ONSET DATE: ~2 months   SUBJECTIVE:                                                                                                                                                                                      SUBJECTIVE STATEMENT: Patient reports shoulder not too bad today. 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 Pain can become severe after prolonged activity.  Right now pain 0/10 at rest. ER and abduction at 90 degrees motions are the worst. HEP been going well, been doing it more.   Evaluation:  Patient reports she does not think there was a specific incident that started this pain. Biggest issue recently is that the pain wakes her up at night, reporting her arm feels like its sleep, that static-like feeling and travels down to wrist sometimes.  Reports issues with pouring a gallon a milk some days. Reports shoulder pain is more in front of the shoulder, and sharp. Has  to help husband physically as he is a bilateral BKA with contractures. Reports he can help some but provides mod/max A. Should be getting hoyer lift soon.  Hand dominance: Right  PERTINENT HISTORY: Hx of Breast Cancer 2021 Previous L shoulder issue/pain   PAIN:  Are you having pain? Yes: NPRS scale: 2-3/10 Pain location: Front of shoulder, intermittently traveling down to elbow,  to wrist at night Pain description: Sharp, Numbness/tingling Aggravating factors: Lifting, flexion, abduction Relieving factors: Rest  PRECAUTIONS: None  RED FLAGS: None   WEIGHT BEARING RESTRICTIONS: No  FALLS:  Has patient fallen in last 6 months? No  LIVING ENVIRONMENT: Lives with: lives with their spouse Caregiver for spouse (see subjective)  OCCUPATION: Retired, Clinical biochemist  PLOF: Independent  PATIENT GOALS: To figure out some way to help the pain and stop the pain at night  NEXT MD VISIT: Maybe in two weeks   OBJECTIVE:  Note: Objective measures were completed at Evaluation unless otherwise noted.  DIAGNOSTIC FINDINGS:  N/A  PATIENT SURVEYS :  QuickDASH Score: 61.4 / 100 = 61.4 %  08/22/23: QuickDASH Score: 31.8 / 100 = 31.8 %   COGNITION: Overall cognitive status: Within functional limits for tasks assessed     SENSATION: WFL  POSTURE: Rounded Shoulders/increased kyphosis   UPPER EXTREMITY ROM:   Active ROM Right eval Left eval Right  08/22/23  Shoulder flexion 140, * at mid-range of motion AROM and PROM WNL 150, * with eccentric   Shoulder extension     Shoulder abduction 135, * at mid-range of motion AROM and PROM WFL 120, * pull feeling under arm, some pain in post shoulder  Shoulder adduction     Shoulder internal rotation     Shoulder external rotation     Elbow flexion     Elbow extension     Wrist flexion     Wrist extension      Wrist ulnar deviation     Wrist radial deviation     Wrist pronation     Wrist supination     (Blank rows = not tested)   *painful  UPPER EXTREMITY MMT:  MMT Right eval Left eval  Shoulder flexion    Shoulder extension    Shoulder abduction    Shoulder adduction    Shoulder internal rotation    Shoulder external rotation    Middle trapezius    Lower trapezius    Elbow flexion    Elbow extension    Wrist flexion    Wrist extension    Wrist ulnar deviation    Wrist radial deviation    Wrist pronation    Wrist supination    Grip strength (lbs)    (Blank rows = not tested)  SHOULDER SPECIAL TESTS: Impingement tests: Hawkins/Kennedy impingement test: positive  SLAP lesions:  Instability tests: Apprehension test: positive  (Modified: Pt in ~45 deg abd + ER w/ pain, improved with AP pressure) Rotator cuff assessment:  Biceps assessment: Speed's test: positive   JOINT MOBILITY TESTING:  Gentle long axis distraction of GH joint inferiorly, no excessive joint motion noted this date, pt reporting light stretch sensation but no excessive discomfort    PALPATION:  Discomfort with palpation around [facility] joint, and inferiorly around region of coracoid process.  Mild Tightness noted and pt reporting comfort during scapular mobility, pt in L side-lying position  TREATMENT DATE:  08/22/23:  Progress Note: Quick DASH UE ROM Vonzell Berber, Apprehension, and distraction testing STM to posterior aspect of shoulder + passive abduction and ER, pt in supine  Passive ER stretch at doorway with towel under elbow, 10 holds, 8x AAROM shoulder abduction, at wall with towel, 10x, 5 holds AAROM shoulder abduction, seated at mat table, 5x Seated levator scapular stretch, 2x30   08/20/23: UBE, level 1, 4', 2' forward, 2'  backward  Seated with feet on 6 step and  pillow at lower back for posture Rt empty can for supraspinatus 1# 2X10 Rt Bicep curls 2#  2x10 Rt Hammer curls 2# 2X10  Rt Supination/pronation 2# 2x10  Rt ER/IR 2# 2X10 Standing ER/IR, YTB, 15x Manual Rt anterior shoulder STM, PROM for all directions, MFR   08/08/23: UBE, level 1, 4', 2' forward, 2'  backward  Manual Rt anterior shoulder STM Supine:  2# cane assisted flexion 10X  2# AAROM ER 10X Seated Bicep curls, 2 lb., 2x10  Supination/pronation, 2 lb., UE rested on step, 2x10 Standing ER/IR, YTB, 15x    PATIENT EDUCATION: Education details: PT evaluation, objective findings, POC, Importance of HEP, Precautions, Clinic policies  Person educated: Patient Education method: Explanation and Demonstration Education comprehension: verbalized understanding and returned demonstration  HOME EXERCISE PROGRAM: Access Code: [email or web address] URL: [email or web address] Date: 07/18/2023 Prepared by: Rosaria Sherman  Exercises - Supine Shoulder Abduction AROM  - 2 x daily - 7 x weekly - 3 sets - 10 reps - Seated Scapular Retraction  - 2 x daily - 7 x weekly - 3 sets - 10 reps - 10 hold - Sidelying Shoulder External Rotation  - 2 x daily - 7 x weekly - 3 sets - 10 reps  Access Code: [email or web address] URL: [email or web address] Date: 08/22/2023 Prepared by: Rosaria Sherman  Exercises - Standing Shoulder External Rotation Stretch in Doorway  - 2 x daily - 7 x weekly - 2 sets - 10 reps - 10 hold - Seated Shoulder Abduction Towel Slide at Table Top  - 2 x daily - 7 x weekly - 2 sets - 10 reps - 5 hold  ASSESSMENT:  CLINICAL IMPRESSION: Progress note performed this date. Patient demonstrates improved self perceived function via QuickDASH and some improvement with R shoulder flexion. Vonzell berber, and apprehension testing this date with patient describing tightness/pulling sensation in posterior shoulder musculature but does not describe any radicular or  nerve-like pain. Patient has met 2 out 7 goals this date. STM to posterior aspect of shoulder during passive ROM, pt able to tolerate more ROM with pressure to painful area. Added passive ER at doorway (to replace previous ER activity) and AAROM into shoulder abduction to HEP. Pt will benefit from continued skilled physical therapy in order to address deficits and pain.    OBJECTIVE IMPAIRMENTS: decreased activity tolerance, decreased ROM, decreased strength, increased muscle spasms, impaired flexibility, impaired UE functional use, improper body mechanics, postural dysfunction, and pain.   ACTIVITY LIMITATIONS: carrying, lifting, reach over head, and caring for others  PARTICIPATION LIMITATIONS: meal prep, cleaning, laundry, and Caring for husband  PERSONAL FACTORS: N/A are also affecting patient's functional outcome.   REHAB POTENTIAL: Good  CLINICAL DECISION MAKING: Stable/uncomplicated  EVALUATION COMPLEXITY: Low  GOALS: Goals reviewed with patient? Yes  SHORT TERM GOALS: Target date: 08/01/23 Patient will be independent with performance of HEP to demonstrate adequate self management of symptoms.  Baseline:  Goal status: IN PROGRESS  2.   Patient will report at least a 25%  improvement with function or pain overall since beginning PT. Baseline: 25% on 08/22/23 Goal status: MET LONG TERM GOALS: Target date: 08/29/23  Patient will report a consistent 2/10 or lower pain rating with daily functional activities in order to demonstrate improved activity tolerance.  Baseline:  Goal status: IN PROGRESS  2.  Patient will achieve at least 160 deg flexion with RUE in order to be able to reach overhead without increased pain. Baseline:  Goal status: IN PROGRESS  3.   Patient will achieve at least 170 deg flexion with RUE in order to be able to reach overhead without increased pain. Baseline:  Goal status: IN PROGRESS  4.  Patient will improve QuickDASH score by at least 14 percent to  achieve MCID and demonstrate improved overall function.  Baseline:  Goal status: MET  5.   Patient will report at least a 50% improvement with function or pain overall since beginning PT. Baseline:  Goal status: IN PROGRESS  PLAN: PT FREQUENCY: 2x/week  PT DURATION: 6 weeks  PLANNED INTERVENTIONS: 97164- PT Re-evaluation, 97110-Therapeutic exercises, 97530- Therapeutic activity, [id number]- Neuromuscular re-education, 97535- Self Care, [id number]- Manual therapy, [phone]- Gait training, [phone]- Electrical stimulation (manual), [phone] (1-2 muscles), [id number] (3+ muscles)- Dry Needling, Patient/Family education, Taping, Joint mobilization, Spinal mobilization, Cryotherapy, and Moist heat  PLAN FOR NEXT SESSION: continue to improve postural strengthening, practice caregiver  body mechanics, manual: scapular mobs, GH mobs, distraction   1:42 PM, 08/22/23 Michelle Sherman, PT, DPT [facility] [place]

## 2023-08-28 ENCOUNTER — Ambulatory Visit (HOSPITAL_COMMUNITY): Attending: Family Medicine | Admitting: Physical Therapy

## 2023-08-28 DIAGNOSIS — Z7409 Other reduced mobility: Secondary | ICD-10-CM | POA: Insufficient documentation

## 2023-08-28 DIAGNOSIS — M25611 Stiffness of right shoulder, not elsewhere classified: Secondary | ICD-10-CM | POA: Diagnosis not present

## 2023-08-28 DIAGNOSIS — M25511 Pain in right shoulder: Secondary | ICD-10-CM | POA: Diagnosis not present

## 2023-08-28 DIAGNOSIS — Z483 Aftercare following surgery for neoplasm: Secondary | ICD-10-CM | POA: Insufficient documentation

## 2023-08-28 NOTE — Therapy (Signed)
 OUTPATIENT PHYSICAL THERAPY UPPER EXTREMITY TREATMENT   Patient Name: Michelle Sherman MRN: [id number] DOB:05-22-1950, 73 y.o., female Today's Date: 08/28/2023   END OF SESSION:  PT End of Session - 08/28/23 1319     Visit Number 6    Number of Visits 12    Date for PT Re-Evaluation 09/12/23    Authorization Type [employer] Medicare    Authorization Time Period UHC approved 12 visits from 07/18/2023-08/29/2023    Authorization - Visit Number 6    Authorization - Number of Visits 12    Progress Note Due on Visit 10    PT Start Time 1154    PT Stop Time 1232    PT Time Calculation (min) 38 min    Activity Tolerance Patient tolerated treatment well    Behavior During Therapy [facility] for tasks assessed/performed             Past Medical History:  Diagnosis Date   Acquired trigger finger of left middle finger 04/11/2021   Acquired trigger finger of right index finger 04/11/2021   Acquired trigger finger of right middle finger 04/11/2021   Allergy    ANA positive 08/16/2021   Arthritis    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 lfts in 100s, resolved with cessation of lipitor. Normal liver ultrasound   Elevated CK 08/15/2021   Family history of adverse reaction to anesthesia    sister, hard to wake up    Family history of lung cancer    Family history of pancreatic cancer    Family history of pancreatic cancer    Genetic testing 10/06/2019   Negative genetic testing:  No pathogenic variants detected on the [facility] Breast Cancer STAT Panel + Common Hereditary Cancers Panel. A variant of uncertain significance (VUS) was detected in  the MLH1 gene called c.[id number]. The report date is 10/05/2019.     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 I discussed option of draining seroma and ways to do this as well as risks.  I discussed that draining would likely make her breast less heavy feeling.  I reviewed that drainage would likely make the right breast smaller and the nipple point inferiorly.  I discussed that the seroma could recur and we would be in    Severe obesity (BMI 35.0-35.9 with comorbidity) (HCC) 11/01/2016   Sleep apnea    wears CPAP nightly   Thyroid  disease    Ulcerative colitis [facility])    Past Surgical History:  Procedure Laterality Date   BREAST LUMPECTOMY  Right 10/2019   BREAST LUMPECTOMY WITH RADIOACTIVE SEED AND SENTINEL LYMPH NODE BIOPSY Right 10/14/2019   Procedure: RIGHT BREAST LUMPECTOMY WITH RADIOACTIVE SEED AND SENTINEL LYMPH NODE BIOPSY;  Surgeon: Aron Shoulders, MD;  Location: [facility];  Service: General;  Laterality: Right;   CHOLECYSTECTOMY     RE-EXCISION OF BREAST LUMPECTOMY Right 11/11/2019   Procedure: RIGHT RE-EXCISION OF BREAST LUMPECTOMY;  Surgeon: Aron Shoulders, MD;  Location: [facility];  Service: General;  Laterality: Right;  RNFA   SKIN SURGERY  12/2015   Fatty tumor removed   TONSILLECTOMY AND ADENOIDECTOMY  1956   Patient Active Problem  List   Diagnosis Date Noted   Breast cancer (HCC) 09/30/2022   Type 2 diabetes mellitus with complications (HCC) 03/18/2022   Need for tetanus booster 03/18/2022   Annual physical exam 03/18/2022   Obesity, Class II, BMI 35-39.9, isolated (see actual BMI) 12/31/2021   Rash 12/31/2021   Bilateral carpal tunnel syndrome 04/12/2021   Hand arthritis 01/27/2021   Arthritis of hand 01/27/2021   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   Genetic testing 10/06/2019   Hypertension associated with diabetes (HCC) 11/01/2016   Hyperlipidemia associated with type 2 diabetes mellitus (HCC) 01/15/2012   Allergic rhinitis 10/05/2011   Ulcerative colitis without complications (HCC) 10/05/2011   Acquired hypothyroidism 10/04/2010   Osteoarthrosis of knee 10/04/2010   OSA on CPAP 06/28/2010    PCP: Duanne Butler DASEN, MD  REFERRING PROVIDER: Duanne Butler DASEN, MD  REFERRING DIAG: M75.91 (ICD-10-CM) - Right supraspinatus tendinitis  THERAPY DIAG:  Decreased ROM of right shoulder  Acute pain of right shoulder  Impaired functional mobility and endurance  Aftercare following surgery for neoplasm  Rationale for Evaluation and Treatment: Rehabilitation  ONSET DATE: ~2 months   SUBJECTIVE:                                                                                                                                                                                      SUBJECTIVE STATEMENT: Patient reports no pain, not bothering her.  Reports she does not have a return MD appt.    Evaluation:  Patient reports she does not think there was a specific incident that started this pain. Biggest issue recently is that the pain wakes her up at night, reporting her arm feels like its sleep, that static-like feeling and travels down to wrist sometimes. Reports issues with pouring a gallon a milk some days. Reports shoulder pain is more in front of the shoulder, and sharp. Has to help husband physically as he is a  bilateral BKA with contractures. Reports he can help some but provides mod/max A. Should be  getting hoyer lift soon.  Hand dominance: Right  PERTINENT HISTORY: Hx of Breast Cancer 2021 Previous L shoulder issue/pain   PAIN:  Are you having pain? No  PRECAUTIONS: None  RED FLAGS: None   WEIGHT BEARING RESTRICTIONS: No  FALLS:  Has patient fallen in last 6 months? No  LIVING ENVIRONMENT: Lives with: lives with their spouse Caregiver for spouse (see subjective)  OCCUPATION: Retired, Clinical biochemist  PLOF: Independent  PATIENT GOALS: To figure out some way to help the pain and stop the pain at night  NEXT MD VISIT: Maybe in two weeks   OBJECTIVE:  Note: Objective measures were completed at Evaluation unless otherwise noted.  DIAGNOSTIC FINDINGS:  N/A  PATIENT SURVEYS :  QuickDASH Score: 61.4 / 100 = 61.4 %  08/22/23: QuickDASH Score: 31.8 / 100 = 31.8 %   COGNITION: Overall cognitive status: Within functional limits for tasks assessed     SENSATION: WFL  POSTURE: Rounded Shoulders/increased kyphosis   UPPER EXTREMITY ROM:   Active ROM Right eval Left eval Right  08/22/23  Shoulder flexion 140, * at mid-range of motion AROM and PROM WNL 150, * with eccentric   Shoulder extension     Shoulder abduction 135, * at mid-range of motion AROM and PROM WFL 120, * pull feeling under arm, some pain in post shoulder  Shoulder adduction     Shoulder internal rotation     Shoulder external rotation     Elbow flexion     Elbow extension     Wrist flexion     Wrist extension     Wrist ulnar deviation     Wrist radial deviation     Wrist pronation     Wrist supination     (Blank rows = not tested)   *painful  UPPER EXTREMITY MMT:  MMT Right eval Left eval  Shoulder flexion    Shoulder extension    Shoulder abduction    Shoulder adduction    Shoulder internal rotation    Shoulder external rotation    Middle trapezius    Lower trapezius    Elbow  flexion    Elbow extension    Wrist flexion    Wrist extension    Wrist ulnar deviation    Wrist radial deviation    Wrist pronation    Wrist supination    Grip strength (lbs)    (Blank rows = not tested)  SHOULDER SPECIAL TESTS: Impingement tests: Hawkins/Kennedy impingement test: positive  SLAP lesions:  Instability tests: Apprehension test: positive  (Modified: Pt in ~45 deg abd + ER w/ pain, improved with AP pressure) Rotator cuff assessment:  Biceps assessment: Speed's test: positive   JOINT MOBILITY TESTING:  Gentle long axis distraction of GH joint inferiorly, no excessive joint motion noted this date, pt reporting light stretch sensation but no excessive discomfort    PALPATION:  Discomfort with palpation around [facility] joint, and inferiorly around region of coracoid process.  Mild Tightness noted and pt reporting comfort during scapular mobility, pt in L side-lying position  TREATMENT DATE:  08/28/23: UBE, level 1, 4', 2' forward, 2'  backward  Seated with feet on 6 step and pillow at lower back for posture Rt empty can for supraspinatus 1# 2X10 Rt Bicep curls 2#  2x10 Rt Hammer curls 2# 2X10  Rt Supination/pronation 2# 2x10  Rt ER/IR 2# 2X10 Standing ER YTB 2X10  IR RTB 2X10  Rows 2x10 RTB  Extensions 2X10 RTB Manual Rt anterior shoulder  STM, PROM for all directions, MFR  08/22/23:  Progress Note: Quick DASH UE ROM Vonzell Berber, Apprehension, and distraction testing STM to posterior aspect of shoulder + passive abduction and ER, pt in supine Passive ER stretch at doorway with towel under elbow, 10 holds, 8x AAROM shoulder abduction, at wall with towel, 10x, 5 holds AAROM shoulder abduction, seated at mat table, 5x Seated levator scapular stretch, 2x30   08/20/23: UBE, level 1, 4', 2' forward, 2'  backward  Seated with feet on 6  step and pillow at lower back for posture Rt empty can for supraspinatus 1# 2X10 Rt Bicep curls 2#  2x10 Rt Hammer curls 2# 2X10  Rt Supination/pronation 2# 2x10  Rt ER/IR 2# 2X10 Standing ER/IR, YTB, 15x Manual Rt anterior shoulder STM, PROM for all directions, MFR   08/08/23: UBE, level 1, 4', 2' forward, 2'  backward  Manual Rt anterior shoulder STM Supine:  2# cane assisted flexion 10X  2# AAROM ER 10X Seated Bicep curls, 2 lb., 2x10  Supination/pronation, 2 lb., UE rested on step, 2x10 Standing ER/IR, YTB, 15x    PATIENT EDUCATION: Education details: PT evaluation, objective findings, POC, Importance of HEP, Precautions, Clinic policies  Person educated: Patient Education method: Explanation and Demonstration Education comprehension: verbalized understanding and returned demonstration  HOME EXERCISE PROGRAM: Access Code: [email or web address] URL: [email or web address] Date: 07/18/2023 Prepared by: Rosaria Powell-Butler  Exercises - Supine Shoulder Abduction AROM  - 2 x daily - 7 x weekly - 3 sets - 10 reps - Seated Scapular Retraction  - 2 x daily - 7 x weekly - 3 sets - 10 reps - 10 hold - Sidelying Shoulder External Rotation  - 2 x daily - 7 x weekly - 3 sets - 10 reps  Access Code: [email or web address] URL: [email or web address] Date: 08/22/2023 Prepared by: Rosaria Powell-Butler  Exercises - Standing Shoulder External Rotation Stretch in Doorway  - 2 x daily - 7 x weekly - 2 sets - 10 reps - 10 hold - Seated Shoulder Abduction Towel Slide at Table Top  - 2 x daily - 7 x weekly - 2 sets - 10 reps - 5 hold  ASSESSMENT:  CLINICAL IMPRESSION: Continued with focus on improving Rt UE strength, ROM and functional ability.  UE strengthening with Dumbbells completed without issue.  Began postural strengthening in standing using Red thera bands and also increased to red for intermal rotation.  Pt was not strong enough ot progress to red for external roation today so  continued with yellow.   Manual on Rt shoulder revealed  Minimal; tightness and no c/o discomfort.  Pt will benefit from continued skilled physical therapy in order to address deficits and pain.    OBJECTIVE IMPAIRMENTS: decreased activity tolerance, decreased ROM, decreased strength, increased muscle spasms, impaired flexibility, impaired UE functional use, improper body mechanics, postural dysfunction, and pain.   ACTIVITY LIMITATIONS: carrying, lifting, reach over head, and caring for others  PARTICIPATION LIMITATIONS: meal prep, cleaning, laundry, and Caring for husband  PERSONAL FACTORS: N/A are also affecting patient's functional outcome.  REHAB POTENTIAL: Good  CLINICAL DECISION MAKING: Stable/uncomplicated  EVALUATION COMPLEXITY: Low  GOALS: Goals reviewed with patient? Yes  SHORT TERM GOALS: Target date: 08/01/23 Patient will be independent with performance of HEP to demonstrate adequate self management of symptoms.  Baseline:  Goal status: IN PROGRESS  2.   Patient will report at least a 25% improvement with function or pain overall since beginning PT. Baseline: 25% on 08/22/23 Goal status: MET LONG TERM GOALS: Target date: 08/29/23  Patient will report a consistent 2/10 or lower pain rating with daily functional activities in order to demonstrate improved activity tolerance.  Baseline:  Goal status: IN PROGRESS  2.  Patient will achieve at least 160 deg flexion with RUE in order to be able to reach overhead without increased pain. Baseline:  Goal status: IN PROGRESS  3.   Patient will achieve at least 170 deg flexion with RUE in order to be able to reach overhead without increased pain. Baseline:  Goal status: IN PROGRESS  4.  Patient will improve QuickDASH score by at least 14 percent to achieve MCID and demonstrate improved overall function.  Baseline:  Goal status: MET  5.   Patient will report at least a 50% improvement with function or pain overall since  beginning PT. Baseline:  Goal status: IN PROGRESS  PLAN: PT FREQUENCY: 2x/week  PT DURATION: 6 weeks  PLANNED INTERVENTIONS: 97164- PT Re-evaluation, 97110-Therapeutic exercises, 97530- Therapeutic activity, 97112- Neuromuscular re-education, 97535- Self Care, [id number]- Manual therapy, [phone]- Gait training, [phone]- Electrical stimulation (manual), [phone] (1-2 muscles), [id number] (3+ muscles)- Dry Needling, Patient/Family education, Taping, Joint mobilization, Spinal mobilization, Cryotherapy, and Moist heat  PLAN FOR NEXT SESSION: continue to improve postural strengthening, practice caregiver  body mechanics, manual: scapular mobs, GH mobs, distraction   1:20 PM, 08/28/23 Greig KATHEE Fuse, PTA/CLT [facility] Ph: [phone]

## 2023-08-29 ENCOUNTER — Encounter: Payer: Self-pay | Admitting: Internal Medicine

## 2023-08-30 ENCOUNTER — Encounter (HOSPITAL_COMMUNITY): Payer: Self-pay

## 2023-08-30 ENCOUNTER — Other Ambulatory Visit: Payer: Self-pay

## 2023-08-30 ENCOUNTER — Ambulatory Visit (HOSPITAL_COMMUNITY)

## 2023-08-30 DIAGNOSIS — M25611 Stiffness of right shoulder, not elsewhere classified: Secondary | ICD-10-CM | POA: Diagnosis not present

## 2023-08-30 DIAGNOSIS — Z483 Aftercare following surgery for neoplasm: Secondary | ICD-10-CM | POA: Diagnosis not present

## 2023-08-30 DIAGNOSIS — M25511 Pain in right shoulder: Secondary | ICD-10-CM | POA: Diagnosis not present

## 2023-08-30 DIAGNOSIS — Z7409 Other reduced mobility: Secondary | ICD-10-CM

## 2023-08-30 MED ORDER — MESALAMINE 1.2 G PO TBEC: 2.4000 g | DELAYED_RELEASE_TABLET | Freq: Two times a day (BID) | ORAL | 0 refills | Status: DC

## 2023-08-30 MED ORDER — FAMOTIDINE 40 MG PO TABS: 40.0000 mg | ORAL_TABLET | Freq: Two times a day (BID) | ORAL | 0 refills | Status: DC

## 2023-08-30 NOTE — Therapy (Addendum)
 OUTPATIENT PHYSICAL THERAPY UPPER EXTREMITY TREATMENT   Patient Name: Michelle Sherman MRN: [id number] DOB:25-Sep-1950, 73 y.o., female Today's Date: 08/30/2023   END OF SESSION:  PT End of Session - 08/30/23 1150     Visit Number 7    Number of Visits 12    Date for PT Re-Evaluation 09/12/23    Authorization Type [employer] Medicare    Authorization Time Period [employer] approved 12 visits from 07/18/2023-08/29/2023    PT Start Time 1150    PT Stop Time 1230    PT Time Calculation (min) 40 min    Activity Tolerance Patient tolerated treatment well    Behavior During Therapy [facility] for tasks assessed/performed             Past Medical History:  Diagnosis Date   Acquired trigger finger of left middle finger 04/11/2021   Acquired trigger finger of right index finger 04/11/2021   Acquired trigger finger of right middle finger 04/11/2021   Allergy    ANA positive 08/16/2021   Arthritis    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 lfts in 100s, resolved with cessation of lipitor. Normal liver ultrasound   Elevated CK 08/15/2021   Family history of adverse reaction to anesthesia    sister, hard to wake up    Family history of lung cancer    Family history of pancreatic cancer    Family history of pancreatic cancer    Genetic testing 10/06/2019   Negative genetic testing:  No pathogenic variants detected on the [facility] Breast Cancer STAT Panel + Common Hereditary Cancers Panel. 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 The report date is 10/05/2019.     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 I discussed option of draining seroma and ways to do this as well as risks.  I discussed that draining would likely make her breast less heavy feeling.  I reviewed that drainage would likely make the right breast smaller and the nipple point inferiorly.  I discussed that the seroma could recur and we would be in    Severe obesity (BMI 35.0-35.9 with comorbidity) (HCC) 11/01/2016   Sleep apnea    wears CPAP nightly   Thyroid  disease    Ulcerative colitis [facility])    Past Surgical History:  Procedure Laterality Date   BREAST LUMPECTOMY Right 10/2019   BREAST LUMPECTOMY WITH RADIOACTIVE SEED AND SENTINEL LYMPH NODE BIOPSY Right 10/14/2019   Procedure: RIGHT BREAST LUMPECTOMY WITH RADIOACTIVE SEED AND  SENTINEL LYMPH NODE BIOPSY;  Surgeon: Aron Shoulders, MD;  Location: [facility];  Service: General;  Laterality: Right;   CHOLECYSTECTOMY     RE-EXCISION OF BREAST LUMPECTOMY Right 11/11/2019   Procedure: RIGHT RE-EXCISION OF BREAST LUMPECTOMY;  Surgeon: Aron Shoulders, MD;  Location: [facility];  Service: General;  Laterality: Right;  RNFA   SKIN SURGERY  12/2015   Fatty tumor removed   TONSILLECTOMY AND ADENOIDECTOMY  1956   Patient Active Problem List   Diagnosis Date Noted   Breast cancer (HCC) 09/30/2022   Type 2 diabetes mellitus with  complications (HCC) 03/18/2022   Need for tetanus booster 03/18/2022   Annual physical exam 03/18/2022   Obesity, Class II, BMI 35-39.9, isolated (see actual BMI) 12/31/2021   Rash 12/31/2021   Bilateral carpal tunnel syndrome 04/12/2021   Hand arthritis 01/27/2021   Arthritis of hand 01/27/2021   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   Genetic testing 10/06/2019   Hypertension associated with diabetes (HCC) 11/01/2016   Hyperlipidemia associated with type 2 diabetes mellitus (HCC) 01/15/2012   Allergic rhinitis 10/05/2011   Ulcerative colitis without complications (HCC) 10/05/2011   Acquired hypothyroidism 10/04/2010   Osteoarthrosis of knee 10/04/2010   OSA on CPAP 06/28/2010    PCP: Duanne Butler DASEN, MD  REFERRING PROVIDER: Duanne Butler DASEN, MD  REFERRING DIAG: M75.91 (ICD-10-CM) - Right supraspinatus tendinitis  THERAPY DIAG:  Decreased ROM of right shoulder  Acute pain of right shoulder  Impaired functional mobility and endurance  Rationale for Evaluation and Treatment: Rehabilitation  ONSET DATE: ~2 months   SUBJECTIVE:                                                                                                                                                                                      SUBJECTIVE STATEMENT: Patient reports 3-4/10 right now. Reports after last visit she was having more pain later that day and following days. Reports she has had some tingling with her R shoulder and arm; notices it most when she lays on her shoulder or has arm extended.    Evaluation:  Patient reports she does not think there was a specific incident that started this pain. Biggest issue recently is that the pain wakes her up at night, reporting her arm feels like its sleep, that static-like feeling and travels down to wrist sometimes. Reports issues with pouring a gallon a milk some days. Reports shoulder pain is more in front of the shoulder, and sharp. Has to help husband physically  as he is a bilateral BKA with contractures. Reports he can help some but provides mod/max A. Should be getting hoyer lift soon.  Hand dominance: Right  PERTINENT HISTORY: Hx of Breast Cancer 2021 Previous L shoulder issue/pain   PAIN:  Are you having pain? No  PRECAUTIONS: None  RED FLAGS: None   WEIGHT BEARING RESTRICTIONS: No  FALLS:  Has patient fallen in last 6 months? No  LIVING ENVIRONMENT: Lives with: lives with their spouse Caregiver for spouse (see subjective)  OCCUPATION: Retired, Clinical biochemist  PLOF: Independent  PATIENT GOALS: To figure out some way to help the pain and stop the pain at night  NEXT MD VISIT: Maybe in two weeks   OBJECTIVE:  Note: Objective measures were completed at Evaluation unless otherwise noted.  DIAGNOSTIC FINDINGS:  N/A  PATIENT SURVEYS :  QuickDASH Score: 61.4 / 100 = 61.4 %  08/22/23: QuickDASH Score: 31.8 / 100 = 31.8 %   COGNITION: Overall cognitive status: Within functional limits for tasks assessed     SENSATION: WFL  POSTURE: Rounded Shoulders/increased kyphosis   UPPER EXTREMITY ROM:   Active ROM Right eval Left eval Right  08/22/23  Shoulder flexion 140, * at mid-range of motion AROM and PROM WNL 150, * with eccentric   Shoulder extension     Shoulder abduction 135, * at mid-range of motion AROM and PROM WFL 120, * pull feeling under arm, some pain in post shoulder  Shoulder adduction     Shoulder internal rotation     Shoulder external rotation     Elbow flexion     Elbow extension     Wrist flexion     Wrist extension     Wrist ulnar deviation     Wrist radial deviation     Wrist pronation     Wrist supination     (Blank rows = not tested)   *painful  UPPER EXTREMITY MMT:  MMT Right eval Left eval  Shoulder flexion    Shoulder extension    Shoulder abduction    Shoulder adduction    Shoulder internal rotation    Shoulder external rotation    Middle trapezius    Lower trapezius     Elbow flexion    Elbow extension    Wrist flexion    Wrist extension    Wrist ulnar deviation    Wrist radial deviation    Wrist pronation    Wrist supination    Grip strength (lbs)    (Blank rows = not tested)  SHOULDER SPECIAL TESTS: Impingement tests: Hawkins/Kennedy impingement test: positive  SLAP lesions:  Instability tests: Apprehension test: positive  (Modified: Pt in ~45 deg abd + ER w/ pain, improved with AP pressure) Rotator cuff assessment:  Biceps assessment: Speed's test: positive   JOINT MOBILITY TESTING:  Gentle long axis distraction of GH joint inferiorly, no excessive joint motion noted this date, pt reporting light stretch sensation but no excessive discomfort    PALPATION:  Discomfort with palpation around [facility] joint, and inferiorly around region of coracoid process.  Mild Tightness noted and pt reporting comfort during scapular mobility, pt in L side-lying position  TREATMENT DATE:  08/30/23: UBE, level 5, forward 3', backward 3' Pulleys, 2x10 flexion  2x10 shoulder abduction Rows 2x10 GTB Extensions 2X10 GTB Posterior shoulder rolls, 1' Bicep curls 3x10 GTB Punches, YTB, 2x10 Tricep press at mat table, 2x10 STM Rt anterior shoulder, pt seated   08/28/23: UBE, level 1, 4', 2' forward, 2'  backward  Seated with feet on 6 step and pillow at lower back for posture Rt empty can for supraspinatus 1# 2X10 Rt Bicep curls 2#  2x10 Rt Hammer curls 2# 2X10  Rt Supination/pronation 2# 2x10  Rt ER/IR 2# 2X10 Standing ER YTB 2X10  IR RTB 2X10  Rows 2x10 RTB  Extensions 2X10 RTB Manual Rt anterior shoulder  STM, PROM for all directions, MFR  08/22/23:  Progress Note: Quick DASH UE ROM Vonzell Berber, Apprehension, and distraction testing STM to posterior aspect of shoulder + passive abduction and ER, pt in supine Passive ER stretch  at doorway with towel under elbow, 10 holds, 8x AAROM shoulder abduction, at wall with towel, 10x, 5 holds AAROM shoulder abduction, seated at mat table, 5x Seated levator scapular stretch, 2x30    PATIENT EDUCATION: Education details: PT evaluation, objective findings, POC, Importance of HEP, Precautions, Clinic policies  Person educated: Patient Education method: Explanation and Demonstration Education comprehension: verbalized understanding and returned demonstration  HOME EXERCISE PROGRAM: Access Code: [email or web address] URL: [email or web address] Date: 07/18/2023 Prepared by: Rosaria Powell-Butler  Exercises - Supine Shoulder Abduction AROM  - 2 x daily - 7 x weekly - 3 sets - 10 reps - Seated Scapular Retraction  - 2 x daily - 7 x weekly - 3 sets - 10 reps - 10 hold - Sidelying Shoulder External Rotation  - 2 x daily - 7 x weekly - 3 sets - 10 reps  Access Code: [email or web address] URL: [email or web address] Date: 08/22/2023 Prepared by: Rosaria Powell-Butler  Exercises - Standing Shoulder External Rotation Stretch in Doorway  - 2 x daily - 7 x weekly - 2 sets - 10 reps - 10 hold - Seated Shoulder Abduction Towel Slide at Table Top  - 2 x daily - 7 x weekly - 2 sets - 10 reps - 5 hold  ASSESSMENT:  CLINICAL IMPRESSION: Patient tolerated session well, although still limited by increased pain in shoulder and upper arm. Began on UBE at increased resistance level, with no reports of pain, more fatigue. Followed with pulleys, to address self PROM. Patient continuing to have pain with eccentric part of shoulder abduction, reporting mild discomfort and popping that can be felt by PT this date. Improves with limiting ROM. Continued pain with ER this date. Reprinted HEP from 2 sessions ago to address ER and abduction, as pt misplaced.  Pt will benefit from continued skilled physical therapy in order to address deficits and pain.    OBJECTIVE IMPAIRMENTS: decreased activity  tolerance, decreased ROM, decreased strength, increased muscle spasms, impaired flexibility, impaired UE functional use, improper body mechanics, postural dysfunction, and pain.   ACTIVITY LIMITATIONS: carrying, lifting, reach over head, and caring for others  PARTICIPATION LIMITATIONS: meal prep, cleaning, laundry, and Caring for husband  PERSONAL FACTORS: N/A are also affecting patient's functional outcome.   REHAB POTENTIAL: Good  CLINICAL DECISION MAKING: Stable/uncomplicated  EVALUATION COMPLEXITY: Low  GOALS: Goals reviewed with patient? Yes  SHORT TERM GOALS: Target date: 09/27/23 Patient will be independent with performance of HEP to demonstrate adequate self management of symptoms.  Baseline:  Goal status: IN PROGRESS  2.   Patient will report at least  a 25% improvement with function or pain overall since beginning PT. Baseline: 25% on 08/22/23 Goal status: MET LONG TERM GOALS: Target date: 09/27/23  Patient will report a consistent 2/10 or lower pain rating with daily functional activities in order to demonstrate improved activity tolerance.  Baseline:  Goal status: IN PROGRESS  2.  Patient will achieve at least 160 deg flexion with RUE in order to be able to reach overhead without increased pain. Baseline:  Goal status: IN PROGRESS  3.   Patient will achieve at least 170 deg flexion with RUE in order to be able to reach overhead without increased pain. Baseline:  Goal status: IN PROGRESS  4.  Patient will improve QuickDASH score by at least 14 percent to achieve MCID and demonstrate improved overall function.  Baseline:  Goal status: MET  5.   Patient will report at least a 50% improvement with function or pain overall since beginning PT. Baseline:  Goal status: IN PROGRESS  PLAN: PT FREQUENCY: 2x/week  PT DURATION: 6 weeks  PLANNED INTERVENTIONS: 97164- PT Re-evaluation, 97110-Therapeutic exercises, 97530- Therapeutic activity, 97112- Neuromuscular  re-education, 97535- Self Care, [id number]- Manual therapy, [phone]- Gait training, [phone]- Electrical stimulation (manual), [phone] (1-2 muscles), [phone] (3+ muscles)- Dry Needling, Patient/Family education, Taping, Joint mobilization, Spinal mobilization, Cryotherapy, and Moist heat  PLAN FOR NEXT SESSION: continue to improve postural strengthening, practice caregiver  body mechanics, manual: scapular mobs, GH mobs, distraction, possibly try e-stim for pain control   12:41 PM, 08/30/23 Rosaria Settler, PT, DPT [facility] Medicare Auth Request Information Treatment Start Date: 07/18/23  Date of referral: 06/15/23 Referring provider: Duanne Butler DASEN, MD Referring diagnosis (ICD 10)? M75.91 (ICD-10-CM) - Right supraspinatus tendinitis Treatment diagnosis (ICD 10)? (if different than referring diagnosis)  M25.611, M25.511, Z74.09  What was this (referring dx) caused by? Ongoing Issue  Lysle of Condition: Initial Onset (within last 3 months)   Laterality: Rt  Current Functional Measure Score: QuickDASH Score: 31.8 / 100 = 31.8 %  Objective measurements identify impairments when they are compared to normal values, the uninvolved extremity, and prior level of function.  [x]  Yes  []  No  Objective assessment of functional ability: Moderate functional limitations   Briefly describe symptoms:  Patient reports increased pain on anterior aspect of R shoulder when lifting her RUE and intermittent numbness and tingling into her R hand which are limiting her ability to perform overhead activities and caring for her disabled husband.   How did symptoms start: Insidiously  Average pain intensity:  Last 24 hours: 4/10  Past week: 4/10  How often does the pt experience symptoms? Frequently  How much have the symptoms interfered with usual daily activities? Moderately  How has condition changed since care began at this facility? A little better  In general,  how is the patients overall health? Good   BACK PAIN (STarT Back Screening Tool) No

## 2023-09-04 ENCOUNTER — Ambulatory Visit (HOSPITAL_COMMUNITY): Admitting: Physical Therapy

## 2023-09-04 DIAGNOSIS — Z483 Aftercare following surgery for neoplasm: Secondary | ICD-10-CM | POA: Diagnosis not present

## 2023-09-04 DIAGNOSIS — M25611 Stiffness of right shoulder, not elsewhere classified: Secondary | ICD-10-CM | POA: Diagnosis not present

## 2023-09-04 DIAGNOSIS — Z7409 Other reduced mobility: Secondary | ICD-10-CM | POA: Diagnosis not present

## 2023-09-04 DIAGNOSIS — M25511 Pain in right shoulder: Secondary | ICD-10-CM

## 2023-09-04 NOTE — Addendum Note (Signed)
 Addended by: LUDGER HEART E on: 09/04/2023 01:28 PM   Modules accepted: Orders

## 2023-09-04 NOTE — Therapy (Signed)
 OUTPATIENT PHYSICAL THERAPY UPPER EXTREMITY TREATMENT   Patient Name: Michelle Sherman MRN: [id number] DOB:13-Aug-1950, 73 y.o., female Today's Date: 09/04/2023   END OF SESSION:  PT End of Session - 09/04/23 1438     Visit Number 8    Number of Visits 12    Date for PT Re-Evaluation 09/12/23    Authorization Type [employer] Medicare    Authorization Time Period UHC approved 12 visits from 07/18/2023-08/29/2023    Authorization - Visit Number 8    Authorization - Number of Visits 12    Progress Note Due on Visit 10    PT Start Time 1435    PT Stop Time 1515    PT Time Calculation (min) 40 min    Activity Tolerance Patient tolerated treatment well    Behavior During Therapy [facility] for tasks assessed/performed              Past Medical History:  Diagnosis Date   Acquired trigger finger of left middle finger 04/11/2021   Acquired trigger finger of right index finger 04/11/2021   Acquired trigger finger of right middle finger 04/11/2021   Allergy    ANA positive 08/16/2021   Arthritis    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 lfts in 100s, resolved with cessation of lipitor. Normal liver ultrasound   Elevated CK 08/15/2021   Family history of adverse reaction to anesthesia    sister, hard to wake up    Family history of lung cancer    Family history of pancreatic cancer    Family history of pancreatic cancer    Genetic testing 10/06/2019   Negative genetic testing:  No pathogenic variants detected on the [facility] Breast Cancer STAT Panel + Common Hereditary Cancers Panel. A variant of uncertain significance (VUS) was detected  in the MLH1 gene called [id number]. The report date is 10/05/2019.     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 I discussed option of draining seroma and ways to do this as well as risks.  I discussed that draining would likely make her breast less heavy feeling.  I reviewed that drainage would likely make the right breast smaller and the nipple point inferiorly.  I discussed that the seroma could recur and we would be in    Severe obesity (BMI 35.0-35.9 with comorbidity) (HCC) 11/01/2016   Sleep apnea    wears CPAP nightly   Thyroid  disease    Ulcerative colitis [facility])    Past Surgical History:  Procedure Laterality Date   BREAST  LUMPECTOMY Right 10/2019   BREAST LUMPECTOMY WITH RADIOACTIVE SEED AND SENTINEL LYMPH NODE BIOPSY Right 10/14/2019   Procedure: RIGHT BREAST LUMPECTOMY WITH RADIOACTIVE SEED AND SENTINEL LYMPH NODE BIOPSY;  Surgeon: Aron Shoulders, MD;  Location: [facility];  Service: General;  Laterality: Right;   CHOLECYSTECTOMY     RE-EXCISION OF BREAST LUMPECTOMY Right 11/11/2019   Procedure: RIGHT RE-EXCISION OF BREAST LUMPECTOMY;  Surgeon: Aron Shoulders, MD;  Location: [facility];  Service: General;  Laterality: Right;  RNFA   SKIN SURGERY  12/2015   Fatty tumor removed   TONSILLECTOMY AND ADENOIDECTOMY  1956   Patient Active Problem  List   Diagnosis Date Noted   Breast cancer (HCC) 09/30/2022   Type 2 diabetes mellitus with complications (HCC) 03/18/2022   Need for tetanus booster 03/18/2022   Annual physical exam 03/18/2022   Obesity, Class II, BMI 35-39.9, isolated (see actual BMI) 12/31/2021   Rash 12/31/2021   Bilateral carpal tunnel syndrome 04/12/2021   Hand arthritis 01/27/2021   Arthritis of hand 01/27/2021   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   Genetic testing 10/06/2019   Hypertension associated with diabetes (HCC) 11/01/2016   Hyperlipidemia associated with type 2 diabetes mellitus (HCC) 01/15/2012   Allergic rhinitis 10/05/2011   Ulcerative colitis without complications (HCC) 10/05/2011   Acquired hypothyroidism 10/04/2010   Osteoarthrosis of knee 10/04/2010   OSA on CPAP 06/28/2010    PCP: Duanne Butler DASEN, MD  REFERRING PROVIDER: Duanne Butler DASEN, MD  REFERRING DIAG: M75.91 (ICD-10-CM) - Right supraspinatus tendinitis  THERAPY DIAG:  Decreased ROM of right shoulder  Acute pain of right shoulder  Impaired functional mobility and endurance  Aftercare following surgery for neoplasm  Rationale for Evaluation and Treatment: Rehabilitation  ONSET DATE: ~2 months   SUBJECTIVE:                                                                                                                                                                                      SUBJECTIVE STATEMENT: Patient reports pain remains around 2-3/10 right now, twingeing a little bit.  Pt with questions regarding need to return to MD if no noticeable improvement and suggested she give it until her return appt unless her pain increases.    Evaluation:  Patient reports she does not think there was a specific incident that started this pain. Biggest issue recently is that the pain wakes her up at night, reporting her arm feels like its sleep, that static-like feeling and travels down to wrist sometimes. Reports issues with pouring  a gallon a milk some days. Reports shoulder pain is more in front of the shoulder, and sharp. Has  to help husband physically as he is a bilateral BKA with contractures. Reports he can help some but provides mod/max A. Should be getting hoyer lift soon.  Hand dominance: Right  PERTINENT HISTORY: Hx of Breast Cancer 2021 Previous L shoulder issue/pain   PAIN:  Are you having pain? No  PRECAUTIONS: None  RED FLAGS: None   WEIGHT BEARING RESTRICTIONS: No  FALLS:  Has patient fallen in last 6 months? No  LIVING ENVIRONMENT: Lives with: lives with their spouse Caregiver for spouse (see subjective)  OCCUPATION: Retired, Clinical biochemist  PLOF: Independent  PATIENT GOALS: To figure out some way to help the pain and stop the pain at night  NEXT MD VISIT: Maybe in two weeks   OBJECTIVE:  Note: Objective measures were completed at Evaluation unless otherwise noted.  DIAGNOSTIC FINDINGS:  N/A  PATIENT SURVEYS :  QuickDASH Score: 61.4 / 100 = 61.4 %  08/22/23: QuickDASH Score: 31.8 / 100 = 31.8 %   COGNITION: Overall cognitive status: Within functional limits for tasks assessed     SENSATION: WFL  POSTURE: Rounded Shoulders/increased kyphosis   UPPER EXTREMITY ROM:   Active ROM Right eval Left eval Right  08/22/23  Shoulder flexion 140, * at mid-range of motion AROM and PROM WNL 150, * with eccentric   Shoulder extension     Shoulder abduction 135, * at mid-range of motion AROM and PROM WFL 120, * pull feeling under arm, some pain in post shoulder  Shoulder adduction     Shoulder internal rotation     Shoulder external rotation     Elbow flexion     Elbow extension     Wrist flexion     Wrist extension     Wrist ulnar deviation     Wrist radial deviation     Wrist pronation     Wrist supination     (Blank rows = not tested)   *painful  UPPER EXTREMITY MMT:  MMT Right eval Left eval  Shoulder flexion    Shoulder extension    Shoulder abduction     Shoulder adduction    Shoulder internal rotation    Shoulder external rotation    Middle trapezius    Lower trapezius    Elbow flexion    Elbow extension    Wrist flexion    Wrist extension    Wrist ulnar deviation    Wrist radial deviation    Wrist pronation    Wrist supination    Grip strength (lbs)    (Blank rows = not tested)  SHOULDER SPECIAL TESTS: Impingement tests: Hawkins/Kennedy impingement test: positive  SLAP lesions:  Instability tests: Apprehension test: positive  (Modified: Pt in ~45 deg abd + ER w/ pain, improved with AP pressure) Rotator cuff assessment:  Biceps assessment: Speed's test: positive   JOINT MOBILITY TESTING:  Gentle long axis distraction of GH joint inferiorly, no excessive joint motion noted this date, pt reporting light stretch sensation but no excessive discomfort    PALPATION:  Discomfort with palpation around [facility] joint, and inferiorly around region of coracoid process.  Mild Tightness noted and pt reporting comfort during scapular mobility, pt in L side-lying position  TREATMENT DATE:  09/04/23: UBE, level 2, forward 3', backward 3' Seated: Pulleys, 2 minutes flexion  2 minutes shoulder abduction Rt empty can for supraspinatus 0# X10 Rt Bicep curls 3#  2x10 Rt Hammer curls 3# 2X10  Rt Supination/pronation 3# 2x10 Standing: Rt ER/IR 3# 2X10  Rows GTB 2X10 Extensions 2X10 GTB STM Rt anterior shoulder, pt seated  08/30/23: UBE, level 5, forward 3', backward 3' Pulleys, 2x10 flexion  2x10 shoulder abduction Rows 2x10 GTB Extensions 2X10 GTB Posterior shoulder rolls, 1' Bicep curls 3x10 GTB Punches, YTB, 2x10 Tricep press at mat table, 2x10 STM Rt anterior shoulder, pt seated   08/28/23: UBE, level 1, 4', 2' forward, 2'  backward  Seated with feet on 6 step and pillow at lower back for posture Rt empty can  for supraspinatus 1# 2X10 Rt Bicep curls 2#  2x10 Rt Hammer curls 2# 2X10  Rt Supination/pronation 2# 2x10  Rt ER/IR 2# 2X10 Standing ER YTB 2X10  IR RTB 2X10  Rows 2x10 RTB  Extensions 2X10 RTB Manual Rt anterior shoulder  STM, PROM for all directions, MFR  08/22/23:  Progress Note: Quick DASH UE ROM Vonzell Berber, Apprehension, and distraction testing STM to posterior aspect of shoulder + passive abduction and ER, pt in supine Passive ER stretch at doorway with towel under elbow, 10 holds, 8x AAROM shoulder abduction, at wall with towel, 10x, 5 holds AAROM shoulder abduction, seated at mat table, 5x Seated levator scapular stretch, 2x30    PATIENT EDUCATION: Education details: PT evaluation, objective findings, POC, Importance of HEP, Precautions, Clinic policies  Person educated: Patient Education method: Explanation and Demonstration Education comprehension: verbalized understanding and returned demonstration  HOME EXERCISE PROGRAM: Access Code: [email or web address] URL: [email or web address] Date: 07/18/2023 Prepared by: Rosaria Powell-Butler  Exercises - Supine Shoulder Abduction AROM  - 2 x daily - 7 x weekly - 3 sets - 10 reps - Seated Scapular Retraction  - 2 x daily - 7 x weekly - 3 sets - 10 reps - 10 hold - Sidelying Shoulder External Rotation  - 2 x daily - 7 x weekly - 3 sets - 10 reps  Access Code: [email or web address] URL: [email or web address] Date: 08/22/2023 Prepared by: Rosaria Powell-Butler  Exercises - Standing Shoulder External Rotation Stretch in Doorway  - 2 x daily - 7 x weekly - 2 sets - 10 reps - 10 hold - Seated Shoulder Abduction Towel Slide at Table Top  - 2 x daily - 7 x weekly - 2 sets - 10 reps - 5 hold  ASSESSMENT:  CLINICAL IMPRESSION: Continued with established therex with focus on improving UE strength and reducing pain in shoulder and upper arm. Pt reports good results with with pulleys added last visit as seemed to  stretch the joint well. Pt voices concerns of her continued pain eccentric part of shoulder abduction, reporting catching from time to time and popping.  Pain with supraspinatus empty can even without weight had discomfort initially.  Able to increase weight with other exercises without difficulty.  Less cues needed with theraband exercises today.  Pt will benefit from continued skilled physical therapy in order to address deficits and pain.    OBJECTIVE IMPAIRMENTS: decreased activity tolerance, decreased ROM, decreased strength, increased muscle spasms, impaired flexibility, impaired UE functional use, improper body mechanics, postural dysfunction, and pain.   ACTIVITY LIMITATIONS: carrying, lifting, reach over head, and caring for others  PARTICIPATION LIMITATIONS: meal prep, cleaning, laundry, and Caring for husband  PERSONAL FACTORS: N/A are also affecting patient's  functional outcome.   REHAB POTENTIAL: Good  CLINICAL DECISION MAKING: Stable/uncomplicated  EVALUATION COMPLEXITY: Low  GOALS: Goals reviewed with patient? Yes  SHORT TERM GOALS: Target date: 09/27/23 Patient will be independent with performance of HEP to demonstrate adequate self management of symptoms.  Baseline:  Goal status: IN PROGRESS  2.   Patient will report at least a 25% improvement with function or pain overall since beginning PT. Baseline: 25% on 08/22/23 Goal status: MET LONG TERM GOALS: Target date: 09/27/23  Patient will report a consistent 2/10 or lower pain rating with daily functional activities in order to demonstrate improved activity tolerance.  Baseline:  Goal status: IN PROGRESS  2.  Patient will achieve at least 160 deg flexion with RUE in order to be able to reach overhead without increased pain. Baseline:  Goal status: IN PROGRESS  3.   Patient will achieve at least 170 deg flexion with RUE in order to be able to reach overhead without increased pain. Baseline:  Goal status: IN  PROGRESS  4.  Patient will improve QuickDASH score by at least 14 percent to achieve MCID and demonstrate improved overall function.  Baseline:  Goal status: MET  5.   Patient will report at least a 50% improvement with function or pain overall since beginning PT. Baseline:  Goal status: IN PROGRESS  PLAN: PT FREQUENCY: 2x/week  PT DURATION: 6 weeks  PLANNED INTERVENTIONS: 97164- PT Re-evaluation, 97110-Therapeutic exercises, 97530- Therapeutic activity, 97112- Neuromuscular re-education, 97535- Self Care, [id number]- Manual therapy, [phone]- Gait training, [phone]- Electrical stimulation (manual), [phone] (1-2 muscles), [id number] (3+ muscles)- Dry Needling, Patient/Family education, Taping, Joint mobilization, Spinal mobilization, Cryotherapy, and Moist heat  PLAN FOR NEXT SESSION: continue to improve postural strengthening, practice caregiver  body mechanics, manual: scapular mobs, GH mobs, distraction, possibly try e-stim for pain control   4:26 PM, 09/04/23 Tamia Powell-Butler, PT, DPT [facility] [place] [facility] [place]

## 2023-09-05 ENCOUNTER — Ambulatory Visit
Admission: RE | Admit: 2023-09-05 | Discharge: 2023-09-05 | Disposition: A | Discharge status: Home / Self Care | Source: Ambulatory Visit | Attending: Family Medicine | Admitting: Family Medicine

## 2023-09-05 DIAGNOSIS — R928 Other abnormal and inconclusive findings on diagnostic imaging of breast: Secondary | ICD-10-CM | POA: Diagnosis not present

## 2023-09-05 DIAGNOSIS — Z9889 Other specified postprocedural states: Secondary | ICD-10-CM

## 2023-09-05 DIAGNOSIS — Z853 Personal history of malignant neoplasm of breast: Secondary | ICD-10-CM | POA: Diagnosis not present

## 2023-09-06 ENCOUNTER — Encounter (HOSPITAL_COMMUNITY)

## 2023-09-07 ENCOUNTER — Ambulatory Visit (HOSPITAL_COMMUNITY)

## 2023-09-07 ENCOUNTER — Encounter (HOSPITAL_COMMUNITY): Payer: Self-pay

## 2023-09-07 DIAGNOSIS — Z7409 Other reduced mobility: Secondary | ICD-10-CM | POA: Diagnosis not present

## 2023-09-07 DIAGNOSIS — M25611 Stiffness of right shoulder, not elsewhere classified: Secondary | ICD-10-CM | POA: Diagnosis not present

## 2023-09-07 DIAGNOSIS — Z483 Aftercare following surgery for neoplasm: Secondary | ICD-10-CM | POA: Diagnosis not present

## 2023-09-07 DIAGNOSIS — M25511 Pain in right shoulder: Secondary | ICD-10-CM

## 2023-09-07 NOTE — Therapy (Signed)
 OUTPATIENT PHYSICAL THERAPY UPPER EXTREMITY TREATMENT   Patient Name: STARLEEN TRUSSELL MRN: [id number] DOB:08-Feb-1950, 73 y.o., female Today's Date: 09/07/2023   END OF SESSION:   09/07/23 1058  PT Visits / Re-Eval  Visit Number 9  Number of Visits 12  Date for PT Re-Evaluation 09/27/23  Authorization  Authorization Type [employer]  Authorization Time Period uhc approved 6 visits from 09/01/23-10/13/23  Authorization - Visit Number 2  Authorization - Number of Visits 6  Progress Note Due on Visit 17  PT Time Calculation  PT Start Time 1102  PT Stop Time 1142  PT Time Calculation (min) 40 min  PT - End of Session  Activity Tolerance Patient tolerated treatment well  Behavior During Therapy [facility] for tasks assessed/performed        Past Medical History:  Diagnosis Date   Acquired trigger finger of left middle finger 04/11/2021   Acquired trigger finger of right index finger 04/11/2021   Acquired trigger finger of right middle finger 04/11/2021   Allergy    ANA positive 08/16/2021   Arthritis    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 lfts in 100s, resolved with cessation of lipitor. Normal liver ultrasound   Elevated CK 08/15/2021   Family history of adverse reaction to anesthesia    sister, hard to wake up    Family history of lung cancer    Family history of pancreatic cancer    Family history of pancreatic cancer    Genetic testing 10/06/2019   Negative genetic testing:  No pathogenic variants detected on the [facility] Breast Cancer STAT Panel + Common Hereditary Cancers Panel. A variant of uncertain significance  (VUS) was detected in the MLH1 gene called c.[id number]. The report date is 10/05/2019.     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 I discussed option of draining seroma and ways to do this as well as risks.  I discussed that draining would likely make her breast less heavy feeling.  I reviewed that drainage would likely make the right breast smaller and the nipple point inferiorly.  I discussed that the seroma could recur and we would be in    Severe obesity (BMI 35.0-35.9 with comorbidity) (HCC) 11/01/2016   Sleep apnea    wears CPAP nightly   Thyroid  disease    Ulcerative colitis [facility])    Past Surgical History:  Procedure Laterality Date   BREAST LUMPECTOMY Right 10/2019   BREAST LUMPECTOMY WITH RADIOACTIVE SEED AND SENTINEL LYMPH NODE BIOPSY Right 10/14/2019   Procedure:  RIGHT BREAST LUMPECTOMY WITH RADIOACTIVE SEED AND SENTINEL LYMPH NODE BIOPSY;  Surgeon: Aron Shoulders, MD;  Location: [facility];  Service: General;  Laterality: Right;   CHOLECYSTECTOMY     RE-EXCISION OF BREAST LUMPECTOMY Right 11/11/2019   Procedure: RIGHT RE-EXCISION OF BREAST LUMPECTOMY;  Surgeon: Aron Shoulders, MD;  Location: [facility];  Service: General;  Laterality: Right;  RNFA   SKIN SURGERY  12/2015   Fatty tumor removed   TONSILLECTOMY AND ADENOIDECTOMY  1956    Patient Active Problem List   Diagnosis Date Noted   Breast cancer (HCC) 09/30/2022   Type 2 diabetes mellitus with complications (HCC) 03/18/2022   Need for tetanus booster 03/18/2022   Annual physical exam 03/18/2022   Obesity, Class II, BMI 35-39.9, isolated (see actual BMI) 12/31/2021   Rash 12/31/2021   Bilateral carpal tunnel syndrome 04/12/2021   Hand arthritis 01/27/2021   Arthritis of hand 01/27/2021   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   Genetic testing 10/06/2019   Hypertension associated with diabetes (HCC) 11/01/2016   Hyperlipidemia associated with type 2 diabetes mellitus (HCC) 01/15/2012   Allergic rhinitis 10/05/2011   Ulcerative colitis without complications (HCC) 10/05/2011   Acquired hypothyroidism 10/04/2010   Osteoarthrosis of knee 10/04/2010   OSA on CPAP 06/28/2010    PCP: Duanne Butler DASEN, MD  REFERRING PROVIDER: Duanne Butler DASEN, MD  REFERRING DIAG: M75.91 (ICD-10-CM) - Right supraspinatus tendinitis  THERAPY DIAG:  Decreased ROM of right shoulder  Acute pain of right shoulder  Impaired functional mobility and endurance  Rationale for Evaluation and Treatment: Rehabilitation  ONSET DATE: ~2 months   SUBJECTIVE:                                                                                                                                                                                      SUBJECTIVE STATEMENT: Pain only with certain movements, no reports of pain currently.  Plans to receive Hoyer lift to assist with husband transfers.  Evaluation:  Patient reports she does not think there was a specific incident that started this pain. Biggest issue recently is that the pain wakes her up at night, reporting her arm feels like its sleep, that static-like feeling and travels down to wrist sometimes. Reports issues with pouring a gallon a milk some days. Reports shoulder pain is more in front of the shoulder, and sharp. Has to help husband physically  as he is a bilateral BKA with contractures. Reports he can help some but provides mod/max A. Should be getting hoyer lift soon.  Hand dominance: Right  PERTINENT HISTORY: Hx of Breast Cancer 2021 Previous L shoulder issue/pain   PAIN:  Are you having pain? No  PRECAUTIONS: None  RED FLAGS: None   WEIGHT BEARING RESTRICTIONS: No  FALLS:  Has patient fallen in last 6 months? No  LIVING ENVIRONMENT: Lives with: lives with their spouse Caregiver for spouse (see subjective)  OCCUPATION: Retired, Clinical biochemist  PLOF: Independent  PATIENT GOALS: To figure out some way to help the pain and stop the pain at night  NEXT MD VISIT: Maybe in two weeks   OBJECTIVE:  Note: Objective measures were completed at Evaluation unless otherwise noted.  DIAGNOSTIC FINDINGS:  N/A  PATIENT SURVEYS :  QuickDASH Score: 61.4 / 100 = 61.4 %  08/22/23: QuickDASH Score: 31.8 / 100 = 31.8 %   COGNITION: Overall cognitive status: Within functional limits for tasks assessed     SENSATION: WFL  POSTURE: Rounded Shoulders/increased kyphosis   UPPER EXTREMITY ROM:   Active ROM Right eval Left eval Right  08/22/23  Shoulder flexion 140, * at mid-range of motion AROM and PROM WNL 150, * with eccentric   Shoulder extension     Shoulder abduction 135, * at mid-range of motion AROM and PROM WFL 120, * pull feeling under arm, some pain in post shoulder  Shoulder adduction     Shoulder internal rotation     Shoulder external rotation     Elbow flexion     Elbow extension     Wrist flexion     Wrist extension     Wrist ulnar deviation     Wrist radial deviation     Wrist pronation     Wrist supination     (Blank rows = not tested)   *painful  UPPER EXTREMITY MMT:  MMT Right eval Left eval  Shoulder flexion    Shoulder extension    Shoulder abduction    Shoulder adduction    Shoulder internal rotation    Shoulder external rotation    Middle trapezius    Lower trapezius     Elbow flexion    Elbow extension    Wrist flexion    Wrist extension    Wrist ulnar deviation    Wrist radial deviation    Wrist pronation    Wrist supination    Grip strength (lbs)    (Blank rows = not tested)  SHOULDER SPECIAL TESTS: Impingement tests: Hawkins/Kennedy impingement test: positive  SLAP lesions:  Instability tests: Apprehension test: positive  (Modified: Pt in ~45 deg abd + ER w/ pain, improved with AP pressure) Rotator cuff assessment:  Biceps assessment: Speed's test: positive   JOINT MOBILITY TESTING:  Gentle long axis distraction of GH joint inferiorly, no excessive joint motion noted this date, pt reporting light stretch sensation but no excessive discomfort    PALPATION:  Discomfort with palpation around [facility] joint, and inferiorly around region of coracoid process.  Mild Tightness noted and pt reporting comfort during scapular mobility, pt in L side-lying position  TREATMENT DATE:  09/06/00: UBE, level 2, forward 3', backward 3' Seated: Pulleys, 2 minutes flexion  2 minutes shoulder abduction  Rt Bicep curls 3#  2x10 Rt Hammer curls 3# 2X10  Rt Supination/pronation 3# 2x10 Standing:   Wall lift 10x  GTB Rows 2x 10  GTB shoulder extension  GTB IR 2x 10  GTB ER 2x 10-painful  Isometric ER painful STM Rt anterior shoulder, supine position   09/04/23: UBE, level 2, forward 3', backward 3' Seated: Pulleys, 2 minutes flexion  2 minutes shoulder abduction Rt empty can for supraspinatus 0# X10 Rt Bicep curls 3#  2x10 Rt Hammer curls 3# 2X10  Rt Supination/pronation 3# 2x10 Standing: Rt ER/IR 3# 2X10  Rows GTB 2X10 Extensions 2X10 GTB STM Rt anterior shoulder, pt seated  08/30/23: UBE, level 5, forward 3', backward 3' Pulleys, 2x10 flexion  2x10 shoulder abduction Rows 2x10 GTB Extensions 2X10 GTB Posterior shoulder rolls,  1' Bicep curls 3x10 GTB Punches, YTB, 2x10 Tricep press at mat table, 2x10 STM Rt anterior shoulder, pt seated   08/28/23: UBE, level 1, 4', 2' forward, 2'  backward  Seated with feet on 6 step and pillow at lower back for posture Rt empty can for supraspinatus 1# 2X10 Rt Bicep curls 2#  2x10 Rt Hammer curls 2# 2X10  Rt Supination/pronation 2# 2x10  Rt ER/IR 2# 2X10 Standing ER YTB 2X10  IR RTB 2X10  Rows 2x10 RTB  Extensions 2X10 RTB Manual Rt anterior shoulder  STM, PROM for all directions, MFR  08/22/23:  Progress Note: Quick DASH UE ROM Vonzell Berber, Apprehension, and distraction testing STM to posterior aspect of shoulder + passive abduction and ER, pt in supine Passive ER stretch at doorway with towel under elbow, 10 holds, 8x AAROM shoulder abduction, at wall with towel, 10x, 5 holds AAROM shoulder abduction, seated at mat table, 5x Seated levator scapular stretch, 2x30    PATIENT EDUCATION: Education details: PT evaluation, objective findings, POC, Importance of HEP, Precautions, Clinic policies  Person educated: Patient Education method: Explanation and Demonstration Education comprehension: verbalized understanding and returned demonstration  HOME EXERCISE PROGRAM: Access Code: [email or web address] URL: [email or web address] Date: 07/18/2023 Prepared by: Rosaria Powell-Butler  Exercises - Supine Shoulder Abduction AROM  - 2 x daily - 7 x weekly - 3 sets - 10 reps - Seated Scapular Retraction  - 2 x daily - 7 x weekly - 3 sets - 10 reps - 10 hold - Sidelying Shoulder External Rotation  - 2 x daily - 7 x weekly - 3 sets - 10 reps  Access Code: [email or web address] URL: [email or web address] Date: 08/22/2023 Prepared by: Rosaria Powell-Butler  Exercises - Standing Shoulder External Rotation Stretch in Doorway  - 2 x daily - 7 x weekly - 2 sets - 10 reps - 10 hold - Seated Shoulder Abduction Towel Slide at Table Top  - 2 x daily - 7 x weekly - 2  sets - 10 reps - 5 hold  ASSESSMENT:  CLINICAL IMPRESSION: Continues with established POC for UE strengthening, mobility and manual to address soft tissue restrictions.  Added wall lift for postural strengthening that was tolerated well.  PT presents with increased difficulty, pain and impaired mobility with ER.  Reports of some catching from time to time and popping during pullies.  EOS with manual, no reports of pain at EOS.     OBJECTIVE IMPAIRMENTS: decreased activity tolerance, decreased ROM, decreased strength, increased muscle spasms, impaired flexibility, impaired UE functional use, improper body mechanics, postural dysfunction, and  pain.   ACTIVITY LIMITATIONS: carrying, lifting, reach over head, and caring for others  PARTICIPATION LIMITATIONS: meal prep, cleaning, laundry, and Caring for husband  PERSONAL FACTORS: N/A are also affecting patient's functional outcome.   REHAB POTENTIAL: Good  CLINICAL DECISION MAKING: Stable/uncomplicated  EVALUATION COMPLEXITY: Low  GOALS: Goals reviewed with patient? Yes  SHORT TERM GOALS: Target date: 09/27/23 Patient will be independent with performance of HEP to demonstrate adequate self management of symptoms.  Baseline:  Goal status: IN PROGRESS  2.   Patient will report at least a 25% improvement with function or pain overall since beginning PT. Baseline: 25% on 08/22/23 Goal status: MET LONG TERM GOALS: Target date: 09/27/23  Patient will report a consistent 2/10 or lower pain rating with daily functional activities in order to demonstrate improved activity tolerance.  Baseline:  Goal status: IN PROGRESS  2.  Patient will achieve at least 160 deg flexion with RUE in order to be able to reach overhead without increased pain. Baseline:  Goal status: IN PROGRESS  3.   Patient will achieve at least 170 deg flexion with RUE in order to be able to reach overhead without increased pain. Baseline:  Goal status: IN PROGRESS  4.   Patient will improve QuickDASH score by at least 14 percent to achieve MCID and demonstrate improved overall function.  Baseline:  Goal status: MET  5.   Patient will report at least a 50% improvement with function or pain overall since beginning PT. Baseline:  Goal status: IN PROGRESS  PLAN: PT FREQUENCY: 2x/week  PT DURATION: 6 weeks  PLANNED INTERVENTIONS: 97164- PT Re-evaluation, 97110-Therapeutic exercises, 97530- Therapeutic activity, 97112- Neuromuscular re-education, 97535- Self Care, [id number]- Manual therapy, [phone]- Gait training, [phone]- Electrical stimulation (manual), [phone] (1-2 muscles), [id number] (3+ muscles)- Dry Needling, Patient/Family education, Taping, Joint mobilization, Spinal mobilization, Cryotherapy, and Moist heat  PLAN FOR NEXT SESSION: continue to improve postural strengthening, practice caregiver  body mechanics, manual: scapular mobs, GH mobs, distraction, possibly try e-stim for pain control  Augustin Mclean, LPTA/CLT; CBIS [phone]  4:31 PM, 09/07/23

## 2023-09-10 ENCOUNTER — Encounter: Payer: Self-pay | Admitting: Family Medicine

## 2023-09-11 ENCOUNTER — Ambulatory Visit (HOSPITAL_COMMUNITY)

## 2023-09-11 ENCOUNTER — Encounter (HOSPITAL_COMMUNITY): Admitting: Physical Therapy

## 2023-09-11 DIAGNOSIS — Z483 Aftercare following surgery for neoplasm: Secondary | ICD-10-CM | POA: Diagnosis not present

## 2023-09-11 DIAGNOSIS — M25511 Pain in right shoulder: Secondary | ICD-10-CM

## 2023-09-11 DIAGNOSIS — Z7409 Other reduced mobility: Secondary | ICD-10-CM

## 2023-09-11 DIAGNOSIS — M25611 Stiffness of right shoulder, not elsewhere classified: Secondary | ICD-10-CM

## 2023-09-11 NOTE — Therapy (Signed)
 OUTPATIENT PHYSICAL THERAPY UPPER EXTREMITY TREATMENT/PROGRESS NOTE 10th visit Progress Note Reporting Period 07/18/23 to 09/11/23  See note below for Objective Data and Assessment of Progress/Goals.   END OF SESSION:   PT End of Session - 09/11/23 1420     Visit Number 10    Number of Visits 12    Date for PT Re-Evaluation 09/27/23    Authorization Type [employer] Medicare    Authorization Time Period uhc approved 6 visits from 09/01/23-10/13/23    Authorization - Visit Number 3    Authorization - Number of Visits 6    Progress Note Due on Visit 17    PT Start Time 1420    PT Stop Time 1500    PT Time Calculation (min) 40 min    Activity Tolerance Patient tolerated treatment well    Behavior During Therapy [facility] for tasks assessed/performed               Patient Name: Michelle Sherman MRN: [id number] DOB:[birth year], 73 y.o., female Today's Date: 09/11/2023   END OF SESSION:    Past Medical History:  Diagnosis Date   Acquired trigger finger of left middle finger 04/11/2021   Acquired trigger finger of right index finger 04/11/2021   Acquired trigger finger of right middle finger 04/11/2021   Allergy    ANA positive 08/16/2021   Arthritis    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 lfts in 100s, resolved with cessation of lipitor. Normal liver ultrasound   Elevated CK 08/15/2021   Family history of adverse reaction to anesthesia    sister, hard to wake up    Family history of lung cancer    Family history of pancreatic cancer    Family history of pancreatic cancer    Genetic testing 10/06/2019   Negative genetic  testing:  No pathogenic variants detected on the [facility] Breast Cancer STAT Panel + Common Hereditary Cancers Panel. 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 The report date is 10/05/2019.     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 I discussed option of draining seroma and ways to do this as well as risks.  I discussed that draining would likely make her breast less heavy feeling.  I reviewed that drainage would likely make the right breast smaller and the nipple point inferiorly.  I discussed that the seroma could recur and we would be in    Severe obesity (BMI 35.0-35.9 with comorbidity) (HCC) 11/01/2016   Sleep apnea  wears CPAP nightly   Thyroid  disease    Ulcerative colitis [facility])    Past Surgical History:  Procedure Laterality Date   BREAST LUMPECTOMY Right 10/2019   BREAST LUMPECTOMY WITH RADIOACTIVE SEED AND SENTINEL LYMPH NODE BIOPSY Right 10/14/2019   Procedure: RIGHT BREAST LUMPECTOMY WITH RADIOACTIVE SEED AND SENTINEL LYMPH NODE BIOPSY;  Surgeon: Aron Shoulders, MD;  Location: [facility];  Service: General;  Laterality: Right;   CHOLECYSTECTOMY     RE-EXCISION OF BREAST LUMPECTOMY Right 11/11/2019   Procedure: RIGHT RE-EXCISION OF BREAST LUMPECTOMY;  Surgeon: Aron Shoulders, MD;  Location: [facility];  Service: General;  Laterality: Right;  RNFA   SKIN SURGERY  12/2015   Fatty tumor removed   TONSILLECTOMY AND ADENOIDECTOMY  1956   Patient Active Problem List   Diagnosis Date Noted   Breast cancer (HCC) 09/30/2022   Type 2 diabetes mellitus with complications (HCC) 03/18/2022   Need for tetanus booster 03/18/2022   Annual physical exam 03/18/2022   Obesity, Class II, BMI 35-39.9, isolated (see actual BMI) 12/31/2021   Rash 12/31/2021   Bilateral carpal tunnel syndrome 04/12/2021   Hand arthritis 01/27/2021   Arthritis of hand 01/27/2021   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   Genetic testing 10/06/2019   Hypertension associated with diabetes (HCC) 11/01/2016   Hyperlipidemia associated with type 2 diabetes mellitus (HCC) 01/15/2012   Allergic rhinitis 10/05/2011   Ulcerative colitis without complications (HCC) 10/05/2011   Acquired hypothyroidism 10/04/2010   Osteoarthrosis of knee 10/04/2010   OSA on CPAP 06/28/2010    PCP: Duanne Butler DASEN, MD  REFERRING PROVIDER: Duanne Butler DASEN, MD  REFERRING DIAG: M75.91 (ICD-10-CM) - Right supraspinatus tendinitis  THERAPY DIAG:  Decreased ROM of right shoulder  Acute pain of right shoulder  Impaired functional mobility and endurance  Rationale for Evaluation and Treatment: Rehabilitation  ONSET DATE: ~2 months   SUBJECTIVE:                                                                                                                                                                                      SUBJECTIVE STATEMENT: Maybe 10%  better; good days and bad days.  Still waiting on hoyer lift; has to go to [place] on Thursday.  2/10 pain today; has some numbness and tingling down her right arm at night.    Evaluation:  Patient reports she does not think there was a specific incident that started this pain. Biggest issue recently is that the pain wakes her up at night, reporting her arm feels like its sleep, that  static-like feeling and travels down to wrist sometimes. Reports issues with pouring a  gallon a milk some days. Reports shoulder pain is more in front of the shoulder, and sharp. Has to help husband physically as he is a bilateral BKA with contractures. Reports he can help some but provides mod/max A. Should be getting hoyer lift soon.  Hand dominance: Right  PERTINENT HISTORY: Hx of Breast Cancer 2021 Previous L shoulder issue/pain   PAIN:  Are you having pain? No  PRECAUTIONS: None  RED FLAGS: None   WEIGHT BEARING RESTRICTIONS: No  FALLS:  Has patient fallen in last 6 months? No  LIVING ENVIRONMENT: Lives with: lives with their spouse Caregiver for spouse (see subjective)  OCCUPATION: Retired, Clinical biochemist  PLOF: Independent  PATIENT GOALS: To figure out some way to help the pain and stop the pain at night  NEXT MD VISIT: Maybe in two weeks   OBJECTIVE:  Note: Objective measures were completed at Evaluation unless otherwise noted.  DIAGNOSTIC FINDINGS:  N/A  PATIENT SURVEYS :  QuickDASH Score: 61.4 / 100 = 61.4 %  08/22/23: QuickDASH Score: 31.8 / 100 = 31.8 %  09/11/23 QuickDASH 36.4/100 = 36.4%   COGNITION: Overall cognitive status: Within functional limits for tasks assessed     SENSATION: WFL  POSTURE: Rounded Shoulders/increased kyphosis   UPPER EXTREMITY ROM:   Active ROM Right eval Left eval Right  08/22/23 Right 09/11/23  Shoulder flexion 140, * at mid-range of motion AROM and PROM WNL 150, * with eccentric  150 AROm in supine  Shoulder extension      Shoulder abduction 135, * at mid-range of motion AROM and PROM WFL 120, * pull feeling under arm, some pain in post shoulder Scaption 140 in supine  Shoulder adduction      Shoulder internal rotation      Shoulder external rotation      Elbow flexion      Elbow extension      Wrist flexion      Wrist extension      Wrist ulnar deviation      Wrist radial deviation      Wrist  pronation      Wrist supination      (Blank rows = not tested)   *painful  UPPER EXTREMITY MMT:  MMT Right eval Left eval  Shoulder flexion    Shoulder extension    Shoulder abduction    Shoulder adduction    Shoulder internal rotation    Shoulder external rotation    Middle trapezius    Lower trapezius    Elbow flexion    Elbow extension    Wrist flexion    Wrist extension    Wrist ulnar deviation    Wrist radial deviation    Wrist pronation    Wrist supination    Grip strength (lbs)    (Blank rows = not tested)  SHOULDER SPECIAL TESTS: Impingement tests: Hawkins/Kennedy impingement test: positive  SLAP lesions:  Instability tests: Apprehension test: positive  (Modified: Pt in ~45 deg abd + ER w/ pain, improved with AP pressure) Rotator cuff assessment:  Biceps assessment: Speed's test: positive   JOINT MOBILITY TESTING:  Gentle long axis distraction of GH joint inferiorly, no excessive joint motion noted this date, pt reporting light stretch sensation but no excessive discomfort    PALPATION:  Discomfort with palpation around [facility] joint, and inferiorly around region of coracoid process.  Mild Tightness noted and pt reporting comfort during scapular mobility, pt in L side-lying position   CERVICAL ROM:   Active ROM  AROM (deg) 09/11/23  Flexion 27  Extension 54  Right lateral flexion 28  Left lateral flexion 31  Right rotation 82  Left rotation 65 tight                                                                                                                             TREATMENT DATE:  09/11/23 Progress note  QuickDash score 36.4% Seated moist heat to right shoulder x 5' while performing quickdash Pulley flexion and abduction x 2' each Cervical spine AROM check see above Seated cervical retractions x 10 Seated cervical retractions with overpressure x 10 Supine  Cervical retractions x 10 Scapular retractions 5 hold x 10 AROM right  shoulder      09/06/00: UBE, level 2, forward 3', backward 3' Seated: Pulleys, 2 minutes flexion  2 minutes shoulder abduction  Rt Bicep curls 3#  2x10 Rt Hammer curls 3# 2X10  Rt Supination/pronation 3# 2x10 Standing:   Wall lift 10x  GTB Rows 2x 10  GTB shoulder extension  GTB IR 2x 10  GTB ER 2x 10-painful  Isometric ER painful STM Rt anterior shoulder, supine position   09/04/23: UBE, level 2, forward 3', backward 3' Seated: Pulleys, 2 minutes flexion  2 minutes shoulder abduction Rt empty can for supraspinatus 0# X10 Rt Bicep curls 3#  2x10 Rt Hammer curls 3# 2X10  Rt Supination/pronation 3# 2x10 Standing: Rt ER/IR 3# 2X10  Rows GTB 2X10 Extensions 2X10 GTB STM Rt anterior shoulder, pt seated  08/30/23: UBE, level 5, forward 3', backward 3' Pulleys, 2x10 flexion  2x10 shoulder abduction Rows 2x10 GTB Extensions 2X10 GTB Posterior shoulder rolls, 1' Bicep curls 3x10 GTB Punches, YTB, 2x10 Tricep press at mat table, 2x10 STM Rt anterior shoulder, pt seated   08/28/23: UBE, level 1, 4', 2' forward, 2'  backward  Seated with feet on 6 step and pillow at lower back for posture Rt empty can for supraspinatus 1# 2X10 Rt Bicep curls 2#  2x10 Rt Hammer curls 2# 2X10  Rt Supination/pronation 2# 2x10  Rt ER/IR 2# 2X10 Standing ER YTB 2X10  IR RTB 2X10  Rows 2x10 RTB  Extensions 2X10 RTB Manual Rt anterior shoulder  STM, PROM for all directions, MFR  08/22/23:  Progress Note: Quick DASH UE ROM Vonzell Berber, Apprehension, and distraction testing STM to posterior aspect of shoulder + passive abduction and ER, pt in supine Passive ER stretch at doorway with towel under elbow, 10 holds, 8x AAROM shoulder abduction, at wall with towel, 10x, 5 holds AAROM shoulder abduction, seated at mat table, 5x Seated levator scapular stretch, 2x30    PATIENT EDUCATION: Education details: PT evaluation, objective findings, POC, Importance of HEP, Precautions, Clinic  policies  Person educated: Patient Education method: Explanation and Demonstration Education comprehension: verbalized understanding and returned demonstration  HOME EXERCISE PROGRAM: Access Code: [email or web address] URL: [email or web address] Date: 07/18/2023 Prepared by: Rosaria Powell-Butler  Exercises - Supine Shoulder Abduction AROM  - 2  x daily - 7 x weekly - 3 sets - 10 reps - Seated Scapular Retraction  - 2 x daily - 7 x weekly - 3 sets - 10 reps - 10 hold - Sidelying Shoulder External Rotation  - 2 x daily - 7 x weekly - 3 sets - 10 reps  Access Code: [email or web address] URL: [email or web address] Date: 08/22/2023 Prepared by: Rosaria Powell-Butler  Exercises - Standing Shoulder External Rotation Stretch in Doorway  - 2 x daily - 7 x weekly - 2 sets - 10 reps - 10 hold - Seated Shoulder Abduction Towel Slide at Table Top  - 2 x daily - 7 x weekly - 2 sets - 10 reps - 5 hold  ASSESSMENT:  CLINICAL IMPRESSION: 10th visit Progress note; moist heat while performing QDASH; score better at eval but about the same as last reassessment.  Continues with pain with shoulder mobility. Checked cervical spine with noted pain that centralizes from elbow to shoulder on the right with repeated cervical retractions however definitely has shoulder impairment with initiating shoulder flexion in supine.  2 visits remain on current plan.  Patient will benefit from continued skilled therapy services  to address deficits and promote return to optimal function.      OBJECTIVE IMPAIRMENTS: decreased activity tolerance, decreased ROM, decreased strength, increased muscle spasms, impaired flexibility, impaired UE functional use, improper body mechanics, postural dysfunction, and pain.   ACTIVITY LIMITATIONS: carrying, lifting, reach over head, and caring for others  PARTICIPATION LIMITATIONS: meal prep, cleaning, laundry, and Caring for husband  PERSONAL FACTORS: N/A are also affecting patient's  functional outcome.   REHAB POTENTIAL: Good  CLINICAL DECISION MAKING: Stable/uncomplicated  EVALUATION COMPLEXITY: Low  GOALS: Goals reviewed with patient? Yes  SHORT TERM GOALS: Target date: 09/27/23 Patient will be independent with performance of HEP to demonstrate adequate self management of symptoms.  Baseline:  Goal status: IN PROGRESS  2.   Patient will report at least a 25% improvement with function or pain overall since beginning PT. Baseline: 25% on 08/22/23 Goal status: MET LONG TERM GOALS: Target date: 09/27/23  Patient will report a consistent 2/10 or lower pain rating with daily functional activities in order to demonstrate improved activity tolerance.  Baseline:  Goal status: IN PROGRESS  2.  Patient will achieve at least 160 deg flexion with RUE in order to be able to reach overhead without increased pain. Baseline:  Goal status: IN PROGRESS  3.   Patient will achieve at least 170 deg flexion with RUE in order to be able to reach overhead without increased pain. Baseline:  Goal status: IN PROGRESS  4.  Patient will improve QuickDASH score by at least 14 percent to achieve MCID and demonstrate improved overall function.  Baseline:  Goal status: MET  5.   Patient will report at least a 50% improvement with function or pain overall since beginning PT. Baseline:  Goal status: IN PROGRESS  PLAN: PT FREQUENCY: 2x/week  PT DURATION: 6 weeks  PLANNED INTERVENTIONS: 97164- PT Re-evaluation, 97110-Therapeutic exercises, 97530- Therapeutic activity, [id number]- Neuromuscular re-education, 97535- Self Care, [id number]- Manual therapy, [phone]- Gait training, [phone]- Electrical stimulation (manual), [phone] (1-2 muscles), [id number] (3+ muscles)- Dry Needling, Patient/Family education, Taping, Joint mobilization, Spinal mobilization, Cryotherapy, and Moist heat  PLAN FOR NEXT SESSION: continue to improve postural strengthening, practice caregiver  body mechanics, manual: scapular mobs, GH  mobs, distraction, possibly try e-stim for pain control; 2 visits remain; likely looking to discharge to progress plateau after 2 more visits if she  remains about the same.

## 2023-09-13 ENCOUNTER — Encounter (HOSPITAL_COMMUNITY): Payer: Self-pay

## 2023-09-13 ENCOUNTER — Encounter (HOSPITAL_COMMUNITY): Admitting: Physical Therapy

## 2023-09-13 ENCOUNTER — Other Ambulatory Visit: Payer: Self-pay

## 2023-09-13 ENCOUNTER — Encounter: Payer: Self-pay | Admitting: Family Medicine

## 2023-09-13 DIAGNOSIS — E039 Hypothyroidism, unspecified: Secondary | ICD-10-CM

## 2023-09-13 DIAGNOSIS — E118 Type 2 diabetes mellitus with unspecified complications: Secondary | ICD-10-CM

## 2023-09-13 MED ORDER — METFORMIN HCL 1000 MG PO TABS: 1000.0000 mg | ORAL_TABLET | Freq: Two times a day (BID) | ORAL | 3 refills | Status: AC

## 2023-09-13 MED ORDER — LEVOTHYROXINE SODIUM 112 MCG PO TABS: 112.0000 ug | ORAL_TABLET | Freq: Every day | ORAL | 3 refills | Status: AC

## 2023-09-13 MED ORDER — TIRZEPATIDE 7.5 MG/0.5ML ~~LOC~~ SOAJ: 7.5000 mg | SUBCUTANEOUS | 0 refills | Status: DC

## 2023-09-14 ENCOUNTER — Telehealth: Payer: Self-pay | Admitting: Nurse Practitioner

## 2023-09-14 NOTE — Telephone Encounter (Signed)
 I spoke with Michelle Sherman to confirm her re-scheduled appointment scheduled for 12/1.

## 2023-09-17 ENCOUNTER — Ambulatory Visit (HOSPITAL_COMMUNITY)

## 2023-09-17 DIAGNOSIS — Z85820 Personal history of malignant melanoma of skin: Secondary | ICD-10-CM | POA: Diagnosis not present

## 2023-09-17 DIAGNOSIS — Z7409 Other reduced mobility: Secondary | ICD-10-CM

## 2023-09-17 DIAGNOSIS — M25611 Stiffness of right shoulder, not elsewhere classified: Secondary | ICD-10-CM | POA: Diagnosis not present

## 2023-09-17 DIAGNOSIS — Z08 Encounter for follow-up examination after completed treatment for malignant neoplasm: Secondary | ICD-10-CM | POA: Diagnosis not present

## 2023-09-17 DIAGNOSIS — Z1283 Encounter for screening for malignant neoplasm of skin: Secondary | ICD-10-CM | POA: Diagnosis not present

## 2023-09-17 DIAGNOSIS — Z483 Aftercare following surgery for neoplasm: Secondary | ICD-10-CM | POA: Diagnosis not present

## 2023-09-17 DIAGNOSIS — D225 Melanocytic nevi of trunk: Secondary | ICD-10-CM | POA: Diagnosis not present

## 2023-09-17 DIAGNOSIS — M25511 Pain in right shoulder: Secondary | ICD-10-CM | POA: Diagnosis not present

## 2023-09-17 MED ORDER — MESALAMINE 1.2 G PO TBEC: 2.4000 g | DELAYED_RELEASE_TABLET | Freq: Two times a day (BID) | ORAL | 3 refills | Status: AC

## 2023-09-17 MED ORDER — FAMOTIDINE 40 MG PO TABS: 40.0000 mg | ORAL_TABLET | Freq: Two times a day (BID) | ORAL | 3 refills | Status: AC

## 2023-09-17 NOTE — Addendum Note (Signed)
 Addended by: Yvonnia Tango N on: 09/17/2023 04:40 PM   Modules accepted: Orders

## 2023-09-17 NOTE — Therapy (Signed)
 OUTPATIENT PHYSICAL THERAPY UPPER EXTREMITY TREATMENT    END OF SESSION:   PT End of Session - 09/17/23 1301     Visit Number 11    Number of Visits 12    Date for PT Re-Evaluation 09/27/23    Authorization Type [employer] Medicare    Authorization Time Period uhc approved 6 visits from 09/01/23-10/13/23    Authorization - Visit Number 4    Authorization - Number of Visits 6    Progress Note Due on Visit 17    PT Start Time 1302    PT Stop Time 1342    PT Time Calculation (min) 40 min    Activity Tolerance Patient tolerated treatment well    Behavior During Therapy [facility] for tasks assessed/performed               Patient Name: Michelle Sherman MRN: [id number] DOB:1951/01/23, 73 y.o., female Today's Date: 09/17/2023   END OF SESSION:    Past Medical History:  Diagnosis Date   Acquired trigger finger of left middle finger 04/11/2021   Acquired trigger finger of right index finger 04/11/2021   Acquired trigger finger of right middle finger 04/11/2021   Allergy    ANA positive 08/16/2021   Arthritis    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 lfts in 100s, resolved with cessation of lipitor. Normal liver ultrasound   Elevated CK 08/15/2021   Family history of adverse reaction to anesthesia    sister, hard to wake up    Family history of lung cancer    Family history of pancreatic cancer    Family history of pancreatic cancer    Genetic testing 10/06/2019   Negative genetic testing:  No pathogenic variants detected on the [facility] Breast Cancer STAT Panel + Common Hereditary Cancers Panel. A variant of uncertain significance  (VUS) was detected in the MLH1 gene called [id number]. The report date is 10/05/2019.     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 I discussed option of draining seroma and ways to do this as well as risks.  I discussed that draining would likely make her breast less heavy feeling.  I reviewed that drainage would likely make the right breast smaller and the nipple point inferiorly.  I discussed that the seroma could recur and we would be in    Severe obesity (BMI 35.0-35.9 with comorbidity) (HCC) 11/01/2016   Sleep apnea    wears CPAP nightly   Thyroid  disease    Ulcerative colitis (HCC)    Past  Surgical History:  Procedure Laterality Date   BREAST LUMPECTOMY Right 10/2019   BREAST LUMPECTOMY WITH RADIOACTIVE SEED AND SENTINEL LYMPH NODE BIOPSY Right 10/14/2019   Procedure: RIGHT BREAST LUMPECTOMY WITH RADIOACTIVE SEED AND SENTINEL LYMPH NODE BIOPSY;  Surgeon: Aron Shoulders, MD;  Location: [facility];  Service: General;  Laterality: Right;   CHOLECYSTECTOMY     RE-EXCISION OF BREAST LUMPECTOMY Right 11/11/2019   Procedure: RIGHT RE-EXCISION OF BREAST LUMPECTOMY;  Surgeon: Aron Shoulders, MD;  Location:  SURGERY CENTER;  Service: General;  Laterality: Right;  RNFA   SKIN SURGERY  12/2015   Fatty tumor removed   TONSILLECTOMY AND ADENOIDECTOMY  1956    Patient Active Problem List   Diagnosis Date Noted   Breast cancer (HCC) 09/30/2022   Type 2 diabetes mellitus with complications (HCC) 03/18/2022   Need for tetanus booster 03/18/2022   Annual physical exam 03/18/2022   Obesity, Class II, BMI 35-39.9, isolated (see actual BMI) 12/31/2021   Rash 12/31/2021   Bilateral carpal tunnel syndrome 04/12/2021   Hand arthritis 01/27/2021   Arthritis of hand 01/27/2021   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   Genetic testing 10/06/2019   Hypertension associated with diabetes (HCC) 11/01/2016   Hyperlipidemia associated with type 2 diabetes mellitus (HCC) 01/15/2012   Allergic rhinitis 10/05/2011   Ulcerative colitis without complications (HCC) 10/05/2011   Acquired hypothyroidism 10/04/2010   Osteoarthrosis of knee 10/04/2010   OSA on CPAP 06/28/2010    PCP: Duanne Butler DASEN, MD  REFERRING PROVIDER: Duanne Butler DASEN, MD  REFERRING DIAG: M75.91 (ICD-10-CM) - Right supraspinatus tendinitis  THERAPY DIAG:  Decreased ROM of right shoulder  Acute pain of right shoulder  Impaired functional mobility and endurance  Rationale for Evaluation and Treatment: Rehabilitation  ONSET DATE: ~2 months   SUBJECTIVE:                                                                                                                                                                                      SUBJECTIVE STATEMENT: Shoulder has been bothering me this weekend; not sure what caused the increase in pain; took her husband to [place] on Thursday; will be having a hoyer lift delivered to the house this week to assist with getting her husband up  Evaluation:  Patient reports she does not think there was a specific incident that started this pain. Biggest issue recently is that the pain wakes her up at night, reporting her arm feels like its sleep, that static-like feeling and travels down to wrist sometimes. Reports issues with pouring a gallon a milk some days.  Reports shoulder pain is more in front of the shoulder,  and sharp. Has to help husband physically as he is a bilateral BKA with contractures. Reports he can help some but provides mod/max A. Should be getting hoyer lift soon.  Hand dominance: Right  PERTINENT HISTORY: Hx of Breast Cancer 2021 Previous L shoulder issue/pain   PAIN:  Are you having pain? No  PRECAUTIONS: None  RED FLAGS: None   WEIGHT BEARING RESTRICTIONS: No  FALLS:  Has patient fallen in last 6 months? No  LIVING ENVIRONMENT: Lives with: lives with their spouse Caregiver for spouse (see subjective)  OCCUPATION: Retired, Clinical biochemist  PLOF: Independent  PATIENT GOALS: To figure out some way to help the pain and stop the pain at night  NEXT MD VISIT: Maybe in two weeks   OBJECTIVE:  Note: Objective measures were completed at Evaluation unless otherwise noted.  DIAGNOSTIC FINDINGS:  N/A  PATIENT SURVEYS :  QuickDASH Score: 61.4 / 100 = 61.4 %  08/22/23: QuickDASH Score: 31.8 / 100 = 31.8 %  09/11/23 QuickDASH 36.4/100 = 36.4%   COGNITION: Overall cognitive status: Within functional limits for tasks assessed     SENSATION: WFL  POSTURE: Rounded Shoulders/increased kyphosis   UPPER EXTREMITY ROM:   Active ROM Right eval Left eval Right  08/22/23 Right 09/11/23  Shoulder flexion 140, * at mid-range of motion AROM and PROM WNL 150, * with eccentric  150 AROm in supine  Shoulder extension      Shoulder abduction 135, * at mid-range of motion AROM and PROM WFL 120, * pull feeling under arm, some pain in post shoulder Scaption 140 in supine  Shoulder adduction      Shoulder internal rotation      Shoulder external rotation      Elbow flexion      Elbow extension      Wrist flexion      Wrist extension      Wrist ulnar deviation      Wrist radial deviation      Wrist pronation      Wrist supination      (Blank rows = not tested)   *painful  UPPER EXTREMITY MMT:  MMT Right eval  Left eval  Shoulder flexion    Shoulder extension    Shoulder abduction    Shoulder adduction    Shoulder internal rotation    Shoulder external rotation    Middle trapezius    Lower trapezius    Elbow flexion    Elbow extension    Wrist flexion    Wrist extension    Wrist ulnar deviation    Wrist radial deviation    Wrist pronation    Wrist supination    Grip strength (lbs)    (Blank rows = not tested)  SHOULDER SPECIAL TESTS: Impingement tests: Hawkins/Kennedy impingement test: positive  SLAP lesions:  Instability tests: Apprehension test: positive  (Modified: Pt in ~45 deg abd + ER w/ pain, improved with AP pressure) Rotator cuff assessment:  Biceps assessment: Speed's test: positive   JOINT MOBILITY TESTING:  Gentle long axis distraction of GH joint inferiorly, no excessive joint motion noted this date, pt reporting light stretch sensation but no excessive discomfort    PALPATION:  Discomfort with palpation around [facility] joint, and inferiorly around region of coracoid process.  Mild Tightness noted and pt reporting comfort during scapular mobility, pt in L side-lying position   CERVICAL ROM:   Active ROM AROM (deg) 09/11/23  Flexion 27  Extension 54  Right lateral flexion 28  Left lateral flexion 31  Right rotation 82  Left rotation 65 tight                                                                                                                             TREATMENT DATE:  09/17/23 Moist heat to right shoulder x 5' to decrease pain and improve soft tissue extensibility while discussing plan of care and getting [facility] assist for her husband Supine: Manual ROM to right shoulder for flexion, abduction and ER x 15' to improve shoulder mobility Wand AAROM flexion 2 x 10 Wand AAROM ER 2 x 10 Standing: Ball roll up the wall x 2'   09/11/23 Progress note  QuickDash score 36.4% Seated moist heat to right shoulder x 5' while performing quickdash Pulley flexion and  abduction x 2' each Cervical spine AROM check see above Seated cervical retractions x 10 Seated cervical retractions with overpressure x 10 Supine  Cervical retractions x 10 Scapular retractions 5 hold x 10 AROM right shoulder      09/06/00: UBE, level 2, forward 3', backward 3' Seated: Pulleys, 2 minutes flexion  2 minutes shoulder abduction  Rt Bicep curls 3#  2x10 Rt Hammer curls 3# 2X10  Rt Supination/pronation 3# 2x10 Standing:   Wall lift 10x  GTB Rows 2x 10  GTB shoulder extension  GTB IR 2x 10  GTB ER 2x 10-painful  Isometric ER painful STM Rt anterior shoulder, supine position   09/04/23: UBE, level 2, forward 3', backward 3' Seated: Pulleys, 2 minutes flexion  2 minutes shoulder abduction Rt empty can for supraspinatus 0# X10 Rt Bicep curls 3#  2x10 Rt Hammer curls 3# 2X10  Rt Supination/pronation 3# 2x10 Standing: Rt ER/IR 3# 2X10  Rows GTB 2X10 Extensions 2X10 GTB STM Rt anterior shoulder, pt seated  08/30/23: UBE, level 5, forward 3', backward 3' Pulleys, 2x10 flexion  2x10 shoulder abduction Rows 2x10 GTB Extensions 2X10 GTB Posterior shoulder rolls, 1' Bicep curls 3x10 GTB Punches, YTB, 2x10 Tricep press at mat table, 2x10 STM Rt anterior shoulder, pt seated   08/28/23: UBE, level 1, 4', 2' forward, 2'  backward  Seated with feet on 6 step and pillow at lower back for posture Rt empty can for supraspinatus 1# 2X10 Rt Bicep curls 2#  2x10 Rt Hammer curls 2# 2X10  Rt Supination/pronation 2# 2x10  Rt ER/IR 2# 2X10 Standing ER YTB 2X10  IR RTB 2X10  Rows 2x10 RTB  Extensions 2X10 RTB Manual Rt anterior shoulder  STM, PROM for all directions, MFR  08/22/23:  Progress Note: Quick DASH UE ROM Vonzell Berber, Apprehension, and distraction testing STM to posterior aspect of shoulder + passive abduction and ER, pt in supine Passive ER stretch at doorway with towel under elbow, 10 holds, 8x AAROM shoulder abduction, at wall with towel,  10x, 5 holds AAROM shoulder abduction, seated at mat table, 5x Seated levator scapular stretch, 2x30    PATIENT EDUCATION: Education details:  PT evaluation, objective findings, POC, Importance of HEP, Precautions, Clinic policies  Person educated: Patient Education method: Explanation and Demonstration Education comprehension: verbalized understanding and returned demonstration  HOME EXERCISE PROGRAM: Access Code: [email or web address] URL: [email or web address] Date: 07/18/2023 Prepared by: Rosaria Powell-Butler  Exercises - Supine Shoulder Abduction AROM  - 2 x daily - 7 x weekly - 3 sets - 10 reps - Seated Scapular Retraction  - 2 x daily - 7 x weekly - 3 sets - 10 reps - 10 hold - Sidelying Shoulder External Rotation  - 2 x daily - 7 x weekly - 3 sets - 10 reps  Access Code: [email or web address] URL: [email or web address] Date: 08/22/2023 Prepared by: Rosaria Powell-Butler  Exercises - Standing Shoulder External Rotation Stretch in Doorway  - 2 x daily - 7 x weekly - 2 sets - 10 reps - 10 hold - Seated Shoulder Abduction Towel Slide at Table Top  - 2 x daily - 7 x weekly - 2 sets - 10 reps - 5 hold  ASSESSMENT:  CLINICAL IMPRESSION:  moist heat while discussing plan of care.  Patient is visibly tired today likely due to the stress and effort required to care for her husband. Continued with there ex to address shoulder mobility.  Discussed with her asking the [facility] for in home assistance to help her care for her husband; a CNA for a couple days.  She is open and states they will see his PCP next month.   Patient will benefit from continued skilled therapy services  to address deficits and promote return to optimal function.      OBJECTIVE IMPAIRMENTS: decreased activity tolerance, decreased ROM, decreased strength, increased muscle spasms, impaired flexibility, impaired UE functional use, improper body mechanics, postural dysfunction, and pain.   ACTIVITY LIMITATIONS:  carrying, lifting, reach over head, and caring for others  PARTICIPATION LIMITATIONS: meal prep, cleaning, laundry, and Caring for husband  PERSONAL FACTORS: N/A are also affecting patient's functional outcome.   REHAB POTENTIAL: Good  CLINICAL DECISION MAKING: Stable/uncomplicated  EVALUATION COMPLEXITY: Low  GOALS: Goals reviewed with patient? Yes  SHORT TERM GOALS: Target date: 09/27/23 Patient will be independent with performance of HEP to demonstrate adequate self management of symptoms.  Baseline:  Goal status: IN PROGRESS  2.   Patient will report at least a 25% improvement with function or pain overall since beginning PT. Baseline: 25% on 08/22/23 Goal status: MET LONG TERM GOALS: Target date: 09/27/23  Patient will report a consistent 2/10 or lower pain rating with daily functional activities in order to demonstrate improved activity tolerance.  Baseline:  Goal status: IN PROGRESS  2.  Patient will achieve at least 160 deg flexion with RUE in order to be able to reach overhead without increased pain. Baseline:  Goal status: IN PROGRESS  3.   Patient will achieve at least 170 deg flexion with RUE in order to be able to reach overhead without increased pain. Baseline:  Goal status: IN PROGRESS  4.  Patient will improve QuickDASH score by at least 14 percent to achieve MCID and demonstrate improved overall function.  Baseline:  Goal status: MET  5.   Patient will report at least a 50% improvement with function or pain overall since beginning PT. Baseline:  Goal status: IN PROGRESS  PLAN: PT FREQUENCY: 2x/week  PT DURATION: 6 weeks  PLANNED INTERVENTIONS: 97164- PT Re-evaluation, 97110-Therapeutic exercises, 97530- Therapeutic activity, [id number]- Neuromuscular re-education, 97535- Self Care, [id number]- Manual therapy, [id number]- Gait training, [id number]- Electrical stimulation (manual), [phone] (1-2  muscles), [id number] (3+ muscles)- Dry Needling, Patient/Family education, Taping, Joint  mobilization, Spinal mobilization, Cryotherapy, and Moist heat  PLAN FOR NEXT SESSION: continue to improve postural strengthening, practice caregiver  body mechanics, manual: scapular mobs, GH mobs, distraction, possibly try e-stim for pain control; 1 visit remains; likely looking to discharge due to progress plateau; reassess next visit

## 2023-09-18 ENCOUNTER — Encounter: Payer: Self-pay | Admitting: Nurse Practitioner

## 2023-09-18 ENCOUNTER — Other Ambulatory Visit: Payer: Self-pay | Admitting: Nurse Practitioner

## 2023-09-18 MED ORDER — EXEMESTANE 25 MG PO TABS: 25.0000 mg | ORAL_TABLET | Freq: Every day | ORAL | 3 refills | Status: AC

## 2023-09-19 ENCOUNTER — Ambulatory Visit (HOSPITAL_COMMUNITY)

## 2023-09-19 ENCOUNTER — Encounter (HOSPITAL_COMMUNITY): Payer: Self-pay

## 2023-09-19 DIAGNOSIS — Z483 Aftercare following surgery for neoplasm: Secondary | ICD-10-CM | POA: Diagnosis not present

## 2023-09-19 DIAGNOSIS — M25511 Pain in right shoulder: Secondary | ICD-10-CM

## 2023-09-19 DIAGNOSIS — Z7409 Other reduced mobility: Secondary | ICD-10-CM

## 2023-09-19 DIAGNOSIS — M25611 Stiffness of right shoulder, not elsewhere classified: Secondary | ICD-10-CM

## 2023-09-19 NOTE — Therapy (Signed)
 OUTPATIENT PHYSICAL THERAPY UPPER EXTREMITY TREATMENT/PN/DISCHARGE  PHYSICAL THERAPY DISCHARGE SUMMARY  Visits from Start of Care: 12  Current functional level related to goals / functional outcomes: See below   Remaining deficits: See below   Education / Equipment: HEP   Patient agrees to discharge. Patient goals were partially met. Patient is being discharged due to maximized rehab potential.     END OF SESSION:   PT End of Session - 09/19/23 1149     Visit Number 12    Number of Visits 12    Date for PT Re-Evaluation 09/27/23    Authorization Type [employer] Medicare    Authorization Time Period uhc approved 6 visits from 09/01/23-10/13/23    Authorization - Visit Number 5    Authorization - Number of Visits 6    Progress Note Due on Visit 17    PT Start Time 1150    PT Stop Time 1230    PT Time Calculation (min) 40 min    Activity Tolerance Patient tolerated treatment well;Patient limited by pain    Behavior During Therapy [facility] for tasks assessed/performed               Patient Name: Michelle Sherman MRN: [id number] DOB:[birth year], 73 y.o., female Today's Date: 09/19/2023   END OF SESSION:    Past Medical History:  Diagnosis Date   Acquired trigger finger of left middle finger 04/11/2021   Acquired trigger finger of right index finger 04/11/2021   Acquired trigger finger of right middle finger 04/11/2021   Allergy    ANA positive 08/16/2021   Arthritis    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 lfts in 100s, resolved with cessation of lipitor. Normal liver ultrasound   Elevated CK 08/15/2021   Family  history of adverse reaction to anesthesia    sister, hard to wake up    Family history of lung cancer    Family history of pancreatic cancer    Family history of pancreatic cancer    Genetic testing 10/06/2019   Negative genetic testing:  No pathogenic variants detected on the [facility] Breast Cancer STAT Panel + Common Hereditary Cancers Panel. 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 The report date is 10/05/2019.     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 I discussed option of draining seroma and ways to do this as well as risks.  I discussed that draining would likely make her breast less heavy feeling.  I reviewed that drainage  would likely make the right breast smaller and the nipple point inferiorly.  I discussed that the seroma could recur and we would be in    Severe obesity (BMI 35.0-35.9 with comorbidity) (HCC) 11/01/2016   Sleep apnea    wears CPAP nightly   Thyroid  disease    Ulcerative colitis [facility])    Past Surgical History:  Procedure Laterality Date   BREAST LUMPECTOMY Right 10/2019   BREAST LUMPECTOMY WITH RADIOACTIVE SEED AND SENTINEL LYMPH NODE BIOPSY Right 10/14/2019   Procedure: RIGHT BREAST LUMPECTOMY WITH RADIOACTIVE SEED AND SENTINEL LYMPH NODE BIOPSY;  Surgeon: Aron Shoulders, MD;  Location: [facility];  Service: General;  Laterality: Right;   CHOLECYSTECTOMY     RE-EXCISION OF BREAST LUMPECTOMY Right 11/11/2019   Procedure: RIGHT RE-EXCISION OF BREAST LUMPECTOMY;  Surgeon: Aron Shoulders, MD;  Location: [facility];  Service: General;  Laterality: Right;  RNFA   SKIN SURGERY  12/2015   Fatty tumor removed   TONSILLECTOMY AND ADENOIDECTOMY  1956   Patient Active Problem List   Diagnosis Date Noted   Breast cancer (HCC) 09/30/2022   Type 2 diabetes mellitus with complications (HCC) 03/18/2022   Need for tetanus booster 03/18/2022   Annual physical exam 03/18/2022   Obesity, Class II, BMI 35-39.9, isolated (see actual BMI) 12/31/2021   Rash 12/31/2021   Bilateral carpal tunnel syndrome 04/12/2021   Hand arthritis 01/27/2021   Arthritis of hand 01/27/2021   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   Genetic testing 10/06/2019   Hypertension associated with diabetes (HCC) 11/01/2016   Hyperlipidemia associated with type 2 diabetes mellitus (HCC) 01/15/2012   Allergic rhinitis 10/05/2011   Ulcerative colitis without complications (HCC) 10/05/2011   Acquired hypothyroidism 10/04/2010   Osteoarthrosis of knee 10/04/2010   OSA on CPAP 06/28/2010    PCP: Duanne Butler DASEN, MD  REFERRING PROVIDER: Duanne Butler DASEN, MD  REFERRING DIAG: M75.91 (ICD-10-CM) - Right supraspinatus tendinitis  THERAPY DIAG:  Decreased ROM of right shoulder  Acute pain of right shoulder  Impaired functional mobility and endurance  Rationale for Evaluation and Treatment: Rehabilitation  ONSET DATE: ~2 months   SUBJECTIVE:                                                                                                                                                                                      SUBJECTIVE STATEMENT: Reports R shoulder pain is about 2/10. Reports she does get some twinges. Reports hoyer will be delivered on 10/02/23.    Evaluation:  Patient reports she does not  think there was a specific incident that started this pain. Biggest issue recently is that the pain  wakes her up at night, reporting her arm feels like its sleep, that static-like feeling and travels down to wrist sometimes. Reports issues with pouring a gallon a milk some days. Reports shoulder pain is more in front of the shoulder, and sharp. Has to help husband physically as he is a bilateral BKA with contractures. Reports he can help some but provides mod/max A. Should be getting hoyer lift soon.  Hand dominance: Right  PERTINENT HISTORY: Hx of Breast Cancer 2021 Previous L shoulder issue/pain   PAIN:  Are you having pain? No  PRECAUTIONS: None  RED FLAGS: None   WEIGHT BEARING RESTRICTIONS: No  FALLS:  Has patient fallen in last 6 months? No  LIVING ENVIRONMENT: Lives with: lives with their spouse Caregiver for spouse (see subjective)  OCCUPATION: Retired, Clinical biochemist  PLOF: Independent  PATIENT GOALS: To figure out some way to help the pain and stop the pain at night  NEXT MD VISIT: Maybe in two weeks   OBJECTIVE:  Note: Objective measures were completed at Evaluation unless otherwise noted.  DIAGNOSTIC FINDINGS:  N/A  PATIENT SURVEYS :  QuickDASH Score: 61.4 / 100 = 61.4 %  08/22/23: QuickDASH Score: 31.8 / 100 = 31.8 %  09/11/23 QuickDASH 36.4/100 = 36.4%  09/19/23: QuickDASH Score: 27.3 / 100 = 27.3 %   COGNITION: Overall cognitive status: Within functional limits for tasks assessed     SENSATION: WFL  POSTURE: Rounded Shoulders/increased kyphosis   UPPER EXTREMITY ROM:   Active ROM Right eval Left eval Right  08/22/23 Right 09/11/23 Right  09/19/23  Shoulder flexion 140, * at mid-range of motion AROM and PROM WNL 150, * with eccentric  150 AROm in supine 155 AROm in supine  Shoulder extension       Shoulder abduction 135, * at mid-range of motion AROM and PROM WFL 120, * pull feeling under arm, some pain in post shoulder Scaption 140 in  supine 160 AROm in supine  Shoulder adduction       Shoulder internal rotation       Shoulder external rotation       Elbow flexion       Elbow extension       Wrist flexion       Wrist extension       Wrist ulnar deviation       Wrist radial deviation       Wrist pronation       Wrist supination       (Blank rows = not tested)   *painful  UPPER EXTREMITY MMT:  MMT Right eval Left eval  Shoulder flexion    Shoulder extension    Shoulder abduction    Shoulder adduction    Shoulder internal rotation    Shoulder external rotation    Middle trapezius    Lower trapezius    Elbow flexion    Elbow extension    Wrist flexion    Wrist extension    Wrist ulnar deviation    Wrist radial deviation    Wrist pronation    Wrist supination    Grip strength (lbs)    (Blank rows = not tested)  SHOULDER SPECIAL TESTS: Impingement tests: Hawkins/Kennedy impingement test: positive  SLAP lesions:  Instability tests: Apprehension test: positive  (Modified: Pt in ~45 deg abd + ER w/ pain, improved with AP pressure) Rotator cuff assessment:  Biceps assessment: Speed's test: positive   JOINT MOBILITY TESTING:  Gentle long axis distraction of GH joint  inferiorly, no excessive joint motion noted this date, pt reporting light stretch sensation but no excessive discomfort    PALPATION:  Discomfort with palpation around [facility] joint, and inferiorly around region of coracoid process.  Mild Tightness noted and pt reporting comfort during scapular mobility, pt in L side-lying position   CERVICAL ROM:   Active ROM AROM (deg) 09/11/23  Flexion 27  Extension 54  Right lateral flexion 28  Left lateral flexion 31  Right rotation 82  Left rotation 65 tight                                                                                                                             TREATMENT DATE:  09/19/23: Progress Note: Quick DASH UE ROM Moist heat to right shoulder during Manual ROM to right  shoulder for flexion, abduction and ER x 15' to improve shoulder mobility Review HEP: AAROM w/ dowel shoulder flexion  AAROM w/ dowel shoulder ER AAROM w/ dowel shoulder abduction Standing Shoulder External Rotation Stretch in Doorway  Seated Shoulder Abduction Towel Slide at Table Top   09/17/23 Moist heat to right shoulder x 5' to decrease pain and improve soft tissue extensibility while discussing plan of care and getting [employer] assist for her husband Supine: Manual ROM to right shoulder for flexion, abduction and ER x 15' to improve shoulder mobility Wand AAROM flexion 2 x 10 Wand AAROM ER 2 x 10 Standing: Ball roll up the wall x 2'   09/11/23 Progress note  QuickDash score 36.4% Seated moist heat to right shoulder x 5' while performing quickdash Pulley flexion and abduction x 2' each Cervical spine AROM check see above Seated cervical retractions x 10 Seated cervical retractions with overpressure x 10 Supine  Cervical retractions x 10 Scapular retractions 5 hold x 10 AROM right shoulder   PATIENT EDUCATION: Education details: PT evaluation, objective findings, POC, Importance of HEP, Precautions, Clinic policies  Person educated: Patient Education method: Explanation and Demonstration Education comprehension: verbalized understanding and returned demonstration  HOME EXERCISE PROGRAM: Access Code: [email or web address] URL: [email or web address] Date: 07/18/2023 Prepared by: Rosaria Powell-Butler  Exercises - Supine Shoulder Abduction AROM  - 2 x daily - 7 x weekly - 3 sets - 10 reps - Seated Scapular Retraction  - 2 x daily - 7 x weekly - 3 sets - 10 reps - 10 hold - Sidelying Shoulder External Rotation  - 2 x daily - 7 x weekly - 3 sets - 10 reps  Access Code: [email or web address] URL: [email or web address] Date: 08/22/2023 Prepared by: Rosaria Powell-Butler  Exercises - Standing Shoulder External Rotation Stretch in Doorway  - 2 x daily - 7 x weekly - 2 sets -  10 reps - 10 hold - Seated Shoulder Abduction Towel Slide at Table Top  - 2 x daily - 7 x weekly - 2 sets - 10 reps - 5 hold  ASSESSMENT:  CLINICAL IMPRESSION: Reassessment/progress note performed this date. Patient remains around similar levels  as previously tested and reports pain around similar levels. Patient reports she has seen some progress with Physical Therapy as she has less discomfort (N/T) down her UE. Pain at shoulder remains, pt describing pain as a catching feeling with certain motions but worse being ER. Patient has met 3 of 8 goals since beginning PT. Fear patient has met a plateau with PT. Encouraged patient to contact MD for follow up for possible imaging and continue HEP and moist heat in meantime as that provides her relief. Instructed to follow up and call front desk office if she would like to continue PT after MD follow up. Patient discharged to HEP this date.    OBJECTIVE IMPAIRMENTS: decreased activity tolerance, decreased ROM, decreased strength, increased muscle spasms, impaired flexibility, impaired UE functional use, improper body mechanics, postural dysfunction, and pain.   ACTIVITY LIMITATIONS: carrying, lifting, reach over head, and caring for others  PARTICIPATION LIMITATIONS: meal prep, cleaning, laundry, and Caring for husband  PERSONAL FACTORS: N/A are also affecting patient's functional outcome.   REHAB POTENTIAL: Good  CLINICAL DECISION MAKING: Stable/uncomplicated  EVALUATION COMPLEXITY: Low  GOALS: Goals reviewed with patient? Yes  SHORT TERM GOALS: Target date: 09/27/23 Patient will be independent with performance of HEP to demonstrate adequate self management of symptoms.  Baseline:  Goal status: NOT MET  2.   Patient will report at least a 25% improvement with function or pain overall since beginning PT. Baseline: 25% on 08/22/23 Goal status: MET   LONG TERM GOALS: Target date: 09/27/23  Patient will report a consistent 2/10 or lower pain  rating with daily functional activities in order to demonstrate improved activity tolerance.  Baseline: Reports 2/10 on 09/19/23 Goal status: MET  2.  Patient will achieve at least 160 deg flexion with RUE in order to be able to reach overhead without increased pain. Baseline:  Goal status: NOT MET  3.   Patient will achieve at least 170 deg flexion with RUE in order to be able to reach overhead without increased pain. Baseline:  Goal status: NOT MET  4.  Patient will improve QuickDASH score by at least 14 percent to achieve MCID and demonstrate improved overall function.  Baseline:  Goal status: MET  5.   Patient will report at least a 50% improvement with function or pain overall since beginning PT. Baseline:  Goal status: NOT MET  PLAN: PT FREQUENCY: 2x/week  PT DURATION: 6 weeks  PLANNED INTERVENTIONS: [id number]- PT Re-evaluation, 97110-Therapeutic exercises, 97530- Therapeutic activity, [id number]- Neuromuscular re-education, 97535- Self Care, [id number]- Manual therapy, [id number]- Gait training, [phone]- Electrical stimulation (manual), [phone] (1-2 muscles), [id number] (3+ muscles)- Dry Needling, Patient/Family education, Taping, Joint mobilization, Spinal mobilization, Cryotherapy, and Moist heat  PLAN FOR NEXT SESSION: Pt discharged   12:44 PM, 09/19/23 Nyaisha Simao Powell-Butler, PT, DPT [facility] [place]

## 2023-09-28 ENCOUNTER — Encounter: Payer: Self-pay | Admitting: Family Medicine

## 2023-09-28 ENCOUNTER — Ambulatory Visit (INDEPENDENT_AMBULATORY_CARE_PROVIDER_SITE_OTHER): Admitting: Family Medicine

## 2023-09-28 VITALS — BP 120/70 | HR 75 | Ht 59.0 in | Wt 171.6 lb

## 2023-09-28 DIAGNOSIS — M7591 Shoulder lesion, unspecified, right shoulder: Secondary | ICD-10-CM

## 2023-09-28 DIAGNOSIS — E118 Type 2 diabetes mellitus with unspecified complications: Secondary | ICD-10-CM | POA: Diagnosis not present

## 2023-09-28 DIAGNOSIS — M25511 Pain in right shoulder: Secondary | ICD-10-CM

## 2023-09-28 DIAGNOSIS — M67921 Unspecified disorder of synovium and tendon, right upper arm: Secondary | ICD-10-CM

## 2023-09-28 DIAGNOSIS — R29898 Other symptoms and signs involving the musculoskeletal system: Secondary | ICD-10-CM | POA: Diagnosis not present

## 2023-09-28 DIAGNOSIS — G8929 Other chronic pain: Secondary | ICD-10-CM

## 2023-09-28 NOTE — Progress Notes (Signed)
 Subjective:     Patient ID: Michelle Sherman, female   DOB: [birth year], 73 y.o.   MRN: [id number]  HPI 06/15/23 Patient is a very pleasant 73 year old Caucasian female here today for follow-up.  She is currently on Rybelsus  7 mg a day.  She does report feeling constipated and bloated and nauseous after taking the medication.  She denies any abdominal pain or chest pain or shortness of breath.  She is due for fasting lab work.  Of note, she also complains of severe pain in her right shoulder.  Passively she is able to abduct her arm to 180 degrees.  However she has severe pain and weakness with empty can testing.  She also has pain with O'Brien's maneuver.  I believe that the patient likely has an irritation or even possible tear in the supraspinatus tendon.  has been going on now several months and gradually getting worse.  We discussed her options and she is interested in physical therapy.  09/28/23 Patient has tried and failed physical therapy 4 weeks.  She continues to have severe pain in her right shoulder.  It keeps her awake at night.  She now has tenderness and pain at the biceps tendon near its insertion at the glenohumeral joint.  She is tender to palpation in the groove.  She has pain with resisted flexion of the elbow.  She also has pain with empty can testing and pain with external rotation of the shoulder.  She has pain with O'Brien's maneuver.  She is unable to take NSAIDs due to ulcerative colitis.  She is requesting an MRI of the shoulder.  She is also here today to recheck her hemoglobin A1c.  She is currently on Mounjaro  along with her metformin .  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 lfts in 100s, resolved with cessation of lipitor. Normal liver ultrasound   Elevated CK 08/15/2021   Family history of adverse reaction to anesthesia    sister, hard to wake up    Family history of lung cancer    Family history of pancreatic cancer    Family history of pancreatic cancer    Genetic testing 10/06/2019   Negative genetic testing:  No pathogenic variants detected on the [facility] Breast Cancer STAT Panel + Common Hereditary Cancers Panel. 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 The report date is 10/05/2019.     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 I discussed option of draining seroma and ways to do this as well as risks.  I discussed that draining would likely make her breast less heavy feeling.  I reviewed that drainage would likely make the right breast smaller and the nipple point inferiorly.  I discussed that the seroma could recur and we would be in    Severe obesity  (BMI 35.0-35.9 with comorbidity) (HCC) 11/01/2016   Sleep apnea    wears CPAP nightly   Thyroid  disease    Ulcerative colitis [facility])    Past Surgical History:  Procedure Laterality Date   BREAST LUMPECTOMY Right 10/2019   BREAST LUMPECTOMY WITH RADIOACTIVE SEED AND SENTINEL LYMPH NODE BIOPSY Right 10/14/2019   Procedure: RIGHT BREAST LUMPECTOMY WITH RADIOACTIVE SEED AND SENTINEL LYMPH NODE BIOPSY;  Surgeon: Aron Shoulders, MD;  Location: [facility];  Service: General;  Laterality: Right;   CHOLECYSTECTOMY     RE-EXCISION OF BREAST LUMPECTOMY Right 11/11/2019   Procedure: RIGHT RE-EXCISION OF BREAST LUMPECTOMY;  Surgeon: Aron Shoulders, MD;  Location: [facility];  Service: General;  Laterality: Right;  RNFA   SKIN SURGERY  12/2015   Fatty tumor removed   TONSILLECTOMY AND ADENOIDECTOMY  1956   Current Outpatient Medications on File Prior to Visit  Medication Sig Dispense Refill   aspirin EC 81 MG tablet Take 1 tablet by mouth daily.     Biotin 2500 MCG CAPS Take 1 capsule by mouth daily.     Cholecalciferol (VITAMIN D3) 2000 units capsule Take 1 capsule by mouth daily.     empagliflozin  (JARDIANCE ) 25 MG TABS tablet Take 1 tablet (25 mg total) by mouth daily before breakfast. 90 tablet 3   Evolocumab  (REPATHA  SURECLICK) 140 MG/ML SOAJ Inject 140 mg into the skin every 14 (fourteen) days. 6 mL 3   exemestane  (AROMASIN ) 25 MG tablet Take 1 tablet (25 mg total) by mouth daily after breakfast. 90 tablet 3   ezetimibe  (ZETIA ) 10 MG tablet Take 1 tablet (10 mg total) by mouth daily. 90 tablet 3   famotidine  (PEPCID ) 40 MG tablet Take 1 tablet (40 mg total) by mouth 2 (two) times daily. 180 tablet 3   fexofenadine (ALLEGRA) 180 MG tablet Take 180 mg by mouth daily as needed.     fluticasone (FLONASE) 50 MCG/ACT nasal spray Place 1 spray into both nostrils daily as needed.     icosapent  Ethyl (VASCEPA ) 1 g capsule Take 1 capsule (1 g total) by mouth 2 (two) times  daily. 180 capsule 3   levothyroxine  (SYNTHROID ) 112 MCG tablet Take 1 tablet (112 mcg total) by mouth daily. 90 tablet 3   MELATONIN PO Take 5-10 mg by mouth at bedtime.     mesalamine  (LIALDA ) 1.2 g EC tablet Take 2 tablets (2.4 g total) by mouth 2 (two) times daily. 360 tablet 3   metFORMIN  (GLUCOPHAGE ) 1000 MG tablet Take 1 tablet (1,000 mg total) by mouth 2 (two) times daily with a meal. 180 tablet 3   Misc Natural Products (TURMERIC CURCUMIN) CAPS Take 1 capsule by mouth daily. 1000 mg     Multiple Vitamin (MULTIVITAMIN) tablet Take 1 tablet by mouth daily.     ramipril  (ALTACE ) 10 MG  capsule Take 1 capsule (10 mg total) by mouth daily. 90 capsule 3   tirzepatide  (MOUNJARO ) 7.5 MG/0.5ML Pen Inject 7.5 mg into the skin once a week. 2 mL 0   traZODone  (DESYREL ) 100 MG tablet One-half or 1 tab at bedtime as needed for sleep 90 tablet 1   No current facility-administered medications on file prior to visit.   Allergies  Allergen Reactions   Lipitor [Atorvastatin ] Other (See Comments)    Hepatitis and myopathy, lfts 100s and elevated CK with symptoms   Nickel Other (See Comments) and Rash    drainage   Sulfa Antibiotics Rash   Bydureon  [Exenatide ] Nausea And Vomiting   Social History   Socioeconomic History   Marital status: Married    Spouse name: Not on file   Number of children: 1   Years of education: Not on file   Highest education level: Not on file  Occupational History   Occupation: Retired     Comment: Retail buyer   Tobacco Use   Smoking status: Never   Smokeless tobacco: Never  Vaping Use   Vaping status: Never Used  Substance and Sexual Activity   Alcohol use: Yes    Comment: occa   Drug use: No   Sexual activity: Yes    Birth control/protection: Post-menopausal  Other Topics Concern   Not on file  Social History Narrative   Married, cares for disabled husband,  no tob, Etoh or drug use; no exercise   Social Drivers of Corporate investment banker Strain: Low  Risk  (06/08/2023)   Received from [facility]   Overall Financial Resource Strain (CARDIA)    Difficulty of Paying Living Expenses: Not hard at all  Food Insecurity: No Food Insecurity (06/08/2023)   Received from [facility]   Hunger Vital Sign    Within the past 12 months, you worried that your food would run out before you got the money to buy more.: Never true    Within the past 12 months, the food you bought just didn't last and you didn't have money to get more.: Never true  Transportation Needs: No Transportation Needs (06/08/2023)   Received from [facility]   PRAPARE - Transportation    Lack of Transportation (Medical): No    Lack of Transportation (Non-Medical): No  Physical Activity: Sufficiently Active (06/08/2023)   Received from [facility]   Exercise Vital Sign    On average, how many days per week do you engage in moderate to strenuous exercise (like a brisk walk)?: 3 days    On average, how many minutes do you engage in exercise at this level?: 60 min  Stress: No Stress Concern Present (06/08/2023)   Received from [facility] - Occupational Stress Questionnaire    Feeling of Stress : Only a little  Social Connections: Socially Integrated (06/08/2023)   Received from [facility]   Social Connection and Isolation Panel    In a typical week, how many times do you talk on the phone with family, friends, or neighbors?: Three times a week    How often do you get together with friends or relatives?: Three times a week    How often do you attend church or religious services?: More than 4 times per year    Do you belong to any clubs or organizations such as church groups, unions, fraternal or athletic groups, or school groups?: Yes  How often do you attend meetings of the clubs or organizations you belong to?: More than 4 times per year    Are you married, widowed, divorced, separated, never married, or living with  a partner?: Married  Intimate Partner Violence: Not At Risk (06/08/2023)   Received from [facility]   Humiliation, Afraid, Rape, and Kick questionnaire    Within the last year, have you been afraid of your partner or ex-partner?: No    Within the last year, have you been humiliated or emotionally abused in other ways by your partner or ex-partner?: No    Within the last year, have you been kicked, hit, slapped, or otherwise physically hurt by your partner or ex-partner?: No    Within the last year, have you been raped or forced to have any kind of sexual activity by your partner or ex-partner?: No     Review of Systems  All other systems reviewed and are negative.      Objective:   Physical Exam Constitutional:      Appearance: Normal appearance. She is obese.  Cardiovascular:     Rate and Rhythm: Normal rate and regular rhythm.     Heart sounds: Normal heart sounds. No murmur heard.    No friction rub. No gallop.  Pulmonary:     Effort: Pulmonary effort is normal.     Breath sounds: Normal breath sounds.  Musculoskeletal:     Right shoulder: Tenderness present. No crepitus. Decreased range of motion. Decreased strength.  Neurological:     Mental Status: She is alert.        Assessment:    Right supraspinatus tendinitis - Plan: MR Shoulder Right Wo Contrast  Tendinopathy of right biceps tendon - Plan: MR Shoulder Right Wo Contrast  Chronic right shoulder pain - Plan: MR Shoulder Right Wo Contrast  Weakness of right shoulder - Plan: MR Shoulder Right Wo Contrast  Type 2 diabetes mellitus with complications (HCC) - Plan: Hemoglobin A1c, Comprehensive metabolic panel with GFR     Plan:     Repeat hemoglobin A1c today.  Goal hemoglobin A1c is less than 7.  Schedule the patient for an MRI of the right shoulder.  I suspect the patient has biceps tendinitis due to compensating for a likely tear of the rotator cuff tendon.  She has tried and failed conservative treatment  over the last 4 months.  She has tried and failed physical therapy.

## 2023-09-29 LAB — COMPREHENSIVE METABOLIC PANEL WITH GFR
AG Ratio: 2 (calc) (ref 1.0–2.5)
ALT: 14 U/L (ref 6–29)
AST: 14 U/L (ref 10–35)
Albumin: 4.6 g/dL (ref 3.6–5.1)
Alkaline phosphatase (APISO): 69 U/L (ref 37–153)
BUN/Creatinine Ratio: 35 (calc) — ABNORMAL HIGH (ref 6–22)
BUN: 19 mg/dL (ref 7–25)
CO2: 27 mmol/L (ref 20–32)
Calcium: 10.1 mg/dL (ref 8.6–10.4)
Chloride: 102 mmol/L (ref 98–110)
Creat: 0.55 mg/dL — ABNORMAL LOW (ref 0.60–1.00)
Globulin: 2.3 g/dL (ref 1.9–3.7)
Glucose, Bld: 149 mg/dL — ABNORMAL HIGH (ref 65–99)
Potassium: 4.3 mmol/L (ref 3.5–5.3)
Sodium: 139 mmol/L (ref 135–146)
Total Bilirubin: 0.6 mg/dL (ref 0.2–1.2)
Total Protein: 6.9 g/dL (ref 6.1–8.1)
eGFR: 96 mL/min/1.73m2 (ref >=60)

## 2023-09-29 LAB — HEMOGLOBIN A1C
Hgb A1c MFr Bld: 6.4 % — ABNORMAL HIGH (ref <5.7)
Mean Plasma Glucose: 137 mg/dL
eAG (mmol/L): 7.6 mmol/L

## 2023-10-01 ENCOUNTER — Ambulatory Visit: Payer: Self-pay | Admitting: Family Medicine

## 2023-10-05 ENCOUNTER — Encounter: Payer: Self-pay | Admitting: Cardiology

## 2023-10-05 ENCOUNTER — Telehealth: Payer: Self-pay

## 2023-10-05 DIAGNOSIS — E1169 Type 2 diabetes mellitus with other specified complication: Secondary | ICD-10-CM

## 2023-10-05 DIAGNOSIS — T466X5A Adverse effect of antihyperlipidemic and antiarteriosclerotic drugs, initial encounter: Secondary | ICD-10-CM

## 2023-10-05 MED ORDER — REPATHA SURECLICK 140 MG/ML ~~LOC~~ SOAJ: 1.0000 mL | SUBCUTANEOUS | 3 refills | Status: AC

## 2023-10-05 NOTE — Telephone Encounter (Signed)
 Repatha  refill sent to [facility], patient notified via [facility].

## 2023-10-09 ENCOUNTER — Telehealth: Payer: Self-pay

## 2023-10-09 ENCOUNTER — Other Ambulatory Visit: Payer: Self-pay

## 2023-10-09 DIAGNOSIS — E1169 Type 2 diabetes mellitus with other specified complication: Secondary | ICD-10-CM

## 2023-10-09 DIAGNOSIS — E118 Type 2 diabetes mellitus with unspecified complications: Secondary | ICD-10-CM

## 2023-10-09 DIAGNOSIS — E66812 Obesity, class 2: Secondary | ICD-10-CM

## 2023-10-09 MED ORDER — TIRZEPATIDE 10 MG/0.5ML ~~LOC~~ SOAJ: 10.0000 mg | SUBCUTANEOUS | 0 refills | Status: DC

## 2023-10-09 NOTE — Telephone Encounter (Signed)
 Copied from CRM [id number]. Topic: Referral - Question >> Oct 09, 2023 11:36 AM Antwanette L wrote: Reason for CRM: Patient is call b/c [facility]  cannot perform an open MRI. The patient is requesting that the order be sent to the hospital. The referral number in [id number]. The patient can be contacted through mychart or by phone at [phone]

## 2023-10-17 ENCOUNTER — Ambulatory Visit (HOSPITAL_COMMUNITY)

## 2023-10-19 ENCOUNTER — Ambulatory Visit (HOSPITAL_COMMUNITY)
Admission: RE | Admit: 2023-10-19 | Discharge: 2023-10-19 | Disposition: A | Discharge status: Home / Self Care | Source: Ambulatory Visit | Attending: Family Medicine | Admitting: Family Medicine

## 2023-10-19 DIAGNOSIS — S46811A Strain of other muscles, fascia and tendons at shoulder and upper arm level, right arm, initial encounter: Secondary | ICD-10-CM | POA: Diagnosis not present

## 2023-10-19 DIAGNOSIS — G8929 Other chronic pain: Secondary | ICD-10-CM | POA: Insufficient documentation

## 2023-10-19 DIAGNOSIS — M67921 Unspecified disorder of synovium and tendon, right upper arm: Secondary | ICD-10-CM | POA: Insufficient documentation

## 2023-10-19 DIAGNOSIS — M7591 Shoulder lesion, unspecified, right shoulder: Secondary | ICD-10-CM | POA: Diagnosis not present

## 2023-10-19 DIAGNOSIS — R29898 Other symptoms and signs involving the musculoskeletal system: Secondary | ICD-10-CM | POA: Diagnosis not present

## 2023-10-19 DIAGNOSIS — M25511 Pain in right shoulder: Secondary | ICD-10-CM | POA: Insufficient documentation

## 2023-10-19 DIAGNOSIS — M75121 Complete rotator cuff tear or rupture of right shoulder, not specified as traumatic: Secondary | ICD-10-CM | POA: Diagnosis not present

## 2023-10-19 DIAGNOSIS — S46111A Strain of muscle, fascia and tendon of long head of biceps, right arm, initial encounter: Secondary | ICD-10-CM | POA: Diagnosis not present

## 2023-10-19 DIAGNOSIS — S46211A Strain of muscle, fascia and tendon of other parts of biceps, right arm, initial encounter: Secondary | ICD-10-CM | POA: Diagnosis not present

## 2023-10-22 ENCOUNTER — Other Ambulatory Visit: Payer: Self-pay

## 2023-10-22 DIAGNOSIS — S46219A Strain of muscle, fascia and tendon of other parts of biceps, unspecified arm, initial encounter: Secondary | ICD-10-CM

## 2023-10-22 DIAGNOSIS — M75121 Complete rotator cuff tear or rupture of right shoulder, not specified as traumatic: Secondary | ICD-10-CM

## 2023-10-26 DIAGNOSIS — M65342 Trigger finger, left ring finger: Secondary | ICD-10-CM | POA: Diagnosis not present

## 2023-10-26 DIAGNOSIS — M65312 Trigger thumb, left thumb: Secondary | ICD-10-CM | POA: Diagnosis not present

## 2023-10-26 DIAGNOSIS — M19041 Primary osteoarthritis, right hand: Secondary | ICD-10-CM | POA: Diagnosis not present

## 2023-11-13 ENCOUNTER — Encounter: Payer: Self-pay | Admitting: Family Medicine

## 2023-11-13 ENCOUNTER — Other Ambulatory Visit: Payer: Self-pay

## 2023-11-13 DIAGNOSIS — E66812 Obesity, class 2: Secondary | ICD-10-CM

## 2023-11-13 DIAGNOSIS — E118 Type 2 diabetes mellitus with unspecified complications: Secondary | ICD-10-CM

## 2023-11-13 DIAGNOSIS — E1169 Type 2 diabetes mellitus with other specified complication: Secondary | ICD-10-CM

## 2023-11-13 MED ORDER — TIRZEPATIDE 10 MG/0.5ML ~~LOC~~ SOAJ: 10.0000 mg | SUBCUTANEOUS | 0 refills | Status: DC

## 2023-12-03 ENCOUNTER — Ambulatory Visit: Attending: General Surgery

## 2023-12-03 VITALS — Wt 162.0 lb

## 2023-12-03 DIAGNOSIS — Z483 Aftercare following surgery for neoplasm: Secondary | ICD-10-CM | POA: Insufficient documentation

## 2023-12-03 NOTE — Therapy (Signed)
 OUTPATIENT PHYSICAL THERAPY SOZO SCREENING NOTE   Patient Name: Michelle Sherman MRN: [id number] DOB:08/05/1950, 73 y.o., female Today's Date: 12/03/2023  PCP: Duanne Butler DASEN, MD REFERRING PROVIDER: Aron Shoulders, MD   PT End of Session - 12/03/23 1514     Visit Number 1   # unchanged due to screen only   PT Start Time 1511    PT Stop Time 1518    PT Time Calculation (min) 7 min    Activity Tolerance Patient tolerated treatment well    Behavior During Therapy [facility] for tasks assessed/performed          Past Medical History:  Diagnosis Date   Acquired trigger finger of left middle finger 04/11/2021   Acquired trigger finger of right index finger 04/11/2021   Acquired trigger finger of right middle finger 04/11/2021   Allergy    ANA positive 08/16/2021   Arthritis    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 lfts in 100s, resolved with cessation of lipitor. Normal liver ultrasound   Elevated CK 08/15/2021   Family history of adverse reaction to anesthesia    sister, hard to wake up    Family history of lung cancer    Family history of pancreatic cancer    Family history of pancreatic cancer    Genetic testing 10/06/2019   Negative genetic testing:  No pathogenic variants detected on the [facility] Breast Cancer STAT Panel + Common Hereditary Cancers Panel. 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 The report date is 10/05/2019.     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 I discussed option of draining seroma and ways to do this as well as risks.  I discussed that draining would likely make her breast less heavy feeling.  I reviewed that drainage would likely make the right breast smaller and the nipple point inferiorly.  I discussed that the seroma could recur and we would be in    Severe obesity (BMI 35.0-35.9 with comorbidity) (HCC) 11/01/2016   Sleep apnea    wears CPAP nightly   Thyroid  disease    Ulcerative colitis [facility])    Past Surgical History:  Procedure Laterality Date   BREAST LUMPECTOMY Right 10/2019   BREAST LUMPECTOMY WITH RADIOACTIVE SEED AND SENTINEL LYMPH NODE BIOPSY Right 10/14/2019   Procedure: RIGHT BREAST LUMPECTOMY WITH RADIOACTIVE SEED AND SENTINEL LYMPH NODE BIOPSY;  Surgeon: Aron Shoulders, MD;  Location: [facility];  Service: General;  Laterality: Right;  CHOLECYSTECTOMY     RE-EXCISION OF BREAST LUMPECTOMY Right 11/11/2019   Procedure: RIGHT RE-EXCISION OF BREAST LUMPECTOMY;  Surgeon: Aron Shoulders, MD;  Location: [facility];  Service: General;  Laterality: Right;  RNFA   SKIN SURGERY  12/2015   Fatty tumor removed   TONSILLECTOMY AND ADENOIDECTOMY  1956   Patient Active Problem List   Diagnosis Date Noted   Breast cancer (HCC) 09/30/2022   Type 2 diabetes mellitus with complications (HCC) 03/18/2022   Need for tetanus booster 03/18/2022   Annual physical exam 03/18/2022   Obesity, Class  II, BMI 35-39.9, isolated (see actual BMI) 12/31/2021   Rash 12/31/2021   Bilateral carpal tunnel syndrome 04/12/2021   Hand arthritis 01/27/2021   Arthritis of hand 01/27/2021   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 She had a right lumpectomy and sentinel node biopsy (2 negative nodes) on 10/14/2019. It is ER/PR positive and HER2 negative with a Ki67 of 5%. She cares for her husband who is in a motorized wheelchair and has bilateral BKA.   PRECAUTIONS: right UE Lymphedema risk, None  SUBJECTIVE: Pt returns for her 6 month L-Dex screen. I have to have a shoulder replacement in the next few months on my cancer side.  PAIN:  Are you having pain? No  SOZO SCREENING: Patient was assessed today using the SOZO machine to determine the lymphedema index score. This was compared to her baseline score. It was determined that she is within the recommended range when compared to her baseline and no further action is needed at this time. She will continue SOZO screenings. These are done every 3 months for 2 years post operatively followed by every 6 months for 2 years, and then annually.      L-DEX FLOWSHEETS - 12/03/23 1500       L-DEX LYMPHEDEMA SCREENING   Measurement Type Unilateral    L-DEX MEASUREMENT EXTREMITY Upper Extremity    POSITION  Standing    DOMINANT SIDE Right    At Risk Side Right    BASELINE SCORE  (UNILATERAL) -2.2    L-DEX SCORE (UNILATERAL) -4.2    VALUE CHANGE (UNILAT) -2         P: Transition to annual L-Dex screens except pt will return 3 months after her upcoming shoulder replacement.   Aden Berwyn Caldron, PTA 12/03/2023, 3:19 PM

## 2023-12-11 ENCOUNTER — Other Ambulatory Visit: Payer: Self-pay

## 2023-12-11 ENCOUNTER — Encounter: Payer: Self-pay | Admitting: Family Medicine

## 2023-12-11 DIAGNOSIS — E1169 Type 2 diabetes mellitus with other specified complication: Secondary | ICD-10-CM

## 2023-12-11 DIAGNOSIS — E118 Type 2 diabetes mellitus with unspecified complications: Secondary | ICD-10-CM

## 2023-12-11 DIAGNOSIS — E66812 Obesity, class 2: Secondary | ICD-10-CM

## 2023-12-11 MED ORDER — TIRZEPATIDE 10 MG/0.5ML ~~LOC~~ SOAJ: 10.0000 mg | SUBCUTANEOUS | 0 refills | Status: AC

## 2023-12-14 ENCOUNTER — Telehealth: Payer: Self-pay

## 2023-12-14 NOTE — Telephone Encounter (Signed)
   Pre-operative Risk Assessment    Patient Name: Michelle Sherman  DOB: 16-Jul-1950 MRN: [id number]   Date of last office visit: 02/21/2023 Date of next office visit: None   Request for Surgical Clearance    Procedure:  Rt Reverse Shoulder Arthroplasty   Date of Surgery:  Clearance TBD                                Surgeon:  Dr. Franky Pointer Surgeon's Group or Practice Name:  [facility] Phone number:  [phone] Fax number:  [phone]   Type of Clearance Requested:   - Medical  - Pharmacy:  Hold Aspirin     Type of Anesthesia:  General    Additional requests/questions:    Bonney Merlynn LITTIE Lang   12/14/2023, 4:26 PM

## 2023-12-17 ENCOUNTER — Telehealth (HOSPITAL_BASED_OUTPATIENT_CLINIC_OR_DEPARTMENT_OTHER): Payer: Self-pay | Admitting: *Deleted

## 2023-12-17 NOTE — Telephone Encounter (Signed)
 Left message to call back to schedule tele pre op appt.

## 2023-12-17 NOTE — Telephone Encounter (Signed)
 Pt has been scheduled tele preop appt 02/06/24 as pt states procedure to be done in Feb 2026. Med rec and consent are done.      Patient Consent for Virtual Visit        Michelle Sherman has provided verbal consent on 12/17/2023 for a virtual visit (video or telephone).   CONSENT FOR VIRTUAL VISIT FOR:  Michelle Sherman  By participating in this virtual visit I agree to the following:  I hereby voluntarily request, consent and authorize [facility] and its employed or contracted physicians, physician assistants, nurse practitioners or other licensed health care professionals (the Practitioner), to provide me with telemedicine health care services (the "Services) as deemed necessary by the treating Practitioner. I acknowledge and consent to receive the Services by the Practitioner via telemedicine. I understand that the telemedicine visit will involve communicating with the Practitioner through live audiovisual communication technology and the disclosure of certain medical information by electronic transmission. I acknowledge that I have been given the opportunity to request an in-person assessment or other available alternative prior to the telemedicine visit and am voluntarily participating in the telemedicine visit.  I understand that I have the right to withhold or withdraw my consent to the use of telemedicine in the course of my care at any time, without affecting my right to future care or treatment, and that the Practitioner or I may terminate the telemedicine visit at any time. I understand that I have the right to inspect all information obtained and/or recorded in the course of the telemedicine visit and may receive copies of available information for a reasonable fee.  I understand that some of the potential risks of receiving the Services via telemedicine include:  Delay or interruption in medical evaluation due to technological equipment failure or disruption; Information  transmitted may not be sufficient (e.g. poor resolution of images) to allow for appropriate medical decision making by the Practitioner; and/or  In rare instances, security protocols could fail, causing a breach of personal health information.  Furthermore, I acknowledge that it is my responsibility to provide information about my medical history, conditions and care that is complete and accurate to the best of my ability. I acknowledge that Practitioner's advice, recommendations, and/or decision may be based on factors not within their control, such as incomplete or inaccurate data provided by me or distortions of diagnostic images or specimens that may result from electronic transmissions. I understand that the practice of medicine is not an exact science and that Practitioner makes no warranties or guarantees regarding treatment outcomes. I acknowledge that a copy of this consent can be made available to me via my patient portal [facility] MyChart), or I can request a printed copy by calling the office of [facility].    I understand that my insurance will be billed for this visit.   I have read or had this consent read to me. I understand the contents of this consent, which adequately explains the benefits and risks of the Services being provided via telemedicine.  I have been provided ample opportunity to ask questions regarding this consent and the Services and have had my questions answered to my satisfaction. I give my informed consent for the services to be provided through the use of telemedicine in my medical care

## 2023-12-17 NOTE — Telephone Encounter (Signed)
   Name: Michelle Sherman  DOB: 24-Jun-1950  MRN: [id number]  Primary Cardiologist: Kardie Tobb, DO   Preoperative team, please contact this patient and set up a phone call appointment for further preoperative risk assessment. Please obtain consent and complete medication review. Thank you for your help.  I confirm that guidance regarding antiplatelet and oral anticoagulation therapy has been completed and, if necessary, noted below.  She may hold aspirin for 5-7 days prior to procedure. Please resume aspirin as soon as possible postprocedure, at the discretion of the surgeon.    I also confirmed the patient resides in the state of [place] . As per [facility] Medical Board telemedicine laws, the patient must reside in the state in which the provider is licensed.   Lum LITTIE Louis, NP 12/17/2023, 8:31 AM [facility]

## 2023-12-17 NOTE — Telephone Encounter (Signed)
 Patient is returning call.

## 2023-12-17 NOTE — Telephone Encounter (Signed)
 Pt has been scheduled tele preop appt 02/06/24 as pt states procedure to be done in Feb 2026. Med rec and consent are done.

## 2023-12-19 ENCOUNTER — Other Ambulatory Visit: Payer: Self-pay

## 2023-12-19 ENCOUNTER — Other Ambulatory Visit

## 2023-12-19 ENCOUNTER — Ambulatory Visit: Admitting: Nurse Practitioner

## 2023-12-19 DIAGNOSIS — C50411 Malignant neoplasm of upper-outer quadrant of right female breast: Secondary | ICD-10-CM

## 2023-12-23 NOTE — Progress Notes (Unsigned)
 Patient Care Team: Duanne Butler DASEN, MD as PCP - General (Family Medicine) Tobb, Kardie, DO as PCP - Cardiology (Cardiology) Neysa Reggy BIRCH, MD as Consulting Physician (Pulmonary Disease) Kristie Lamprey, MD as Consulting Physician (Gastroenterology) Dow Maxwell, PT as Physical Therapist (Physical Therapy) Gershon Donnice SAUNDERS, DPM as Consulting Physician (Podiatry) Charmayne Molly, MD as Consulting Physician (Ophthalmology) Tyree Nanetta SAILOR, RN as Oncology Nurse Navigator Aron Shoulders, MD as Consulting Physician (General Surgery) Lanny Callander, MD as Consulting Physician (Hematology) Dewey Rush, MD as Consulting Physician (Radiation Oncology) Burton, Lacie K, NP as Nurse Practitioner (Nurse Practitioner) Pandora Cadet, [facility] as Pharmacist (Pharmacist) Alger Gong, MD as Consulting Physician (Obstetrics and Gynecology) Jama No (Inactive) (Cardiology)  Clinic Day:  12/24/2023  Referring physician: Duanne Butler DASEN, MD  ASSESSMENT & PLAN:   Assessment & Plan: Malignant neoplasm of upper-outer quadrant of right breast in female, 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 -S/p right lumpectomy with Dr Aron on 10/14/19 and right breast re-excision on 11/11/19 due to positive margins. Oncotype showed RS 0, low risk. -she completed adjuvant Radiation 12/22/19-01/19/20 -Se began adjuvant anastrozole  in 01/2020. She is tolerating well overall, except rare hot flash and moderate joint pain mainly in her hands today.  This is likely multifactorial, but also related to AI.  -Due to increasing joint pain from anastrozole  and arthritis, we decided together to hold anastrozole  for 2-4 weeks to see how pain responds.  -Bilateral diagnostic mammogram done 09/04/2022 with benign results. -DEXA scan done 09/04/2022 showing normal bone density. -started exemestane   09/26/2022.  -06/19/2023 - tolerating exemestane  much better than anastrozole  with mild and manageable side effects.  -3D diagnostic mammogram done 09/02/2023 showed category B breast density and overall benign results.  Repeat mammogram in August 2026. - New DEXA scan should be ordered for August 2026. - Continue exemestane  daily. - Plan for labs and follow-up in 6 months, sooner if needed.    Right breast cancer, ER + Reviewed 3D diagnostic mammogram from 09/02/2023 which was benign.  She has category B breast density.  Tolerating exemestane  without severe side effects.  New diagnostic mammogram due in August 2026.  Continue exemestane  daily.  Goal is for 5 years.  Initially started anastrozole  in January 2022.  Bone health Patient had DEXA scan done in August 2024.  She had normal bone density.  She takes 2000 IU daily of vitamin D3.  She is active with weightbearing activities.  Repeat DEXA scan in August 2026.  Plan Labs reviewed. -CBC and CMP unremarkable. Reviewed diagnostic mammogram from 09/02/2023 which was benign.  Repeat in August 2026. Prior DEXA scan done 09/04/2023 with normal results.  Repeat in August 2026. Continue exemestane  daily. Plan for labs and follow-up in 6 months, sooner if needed.  The patient understands the plans discussed today and is in agreement with them.  She knows to contact our office if she develops concerns prior to her next appointment.  I provided 25 minutes of face-to-face time during this encounter and > 50% was spent counseling as documented under my assessment and plan.    Powell FORBES Lessen, NP  [facility] - A DEPT OF [facility] [address] [place] [address] Dept: [phone] Dept Fax: [phone]   No orders of the defined types were placed in this encounter.  CHIEF COMPLAINT:  CC: right breast cancer, ER +  Current Treatment:  exemestane  daily (started  09/26/2022; initially started anastrozole  01/2020)  INTERVAL HISTORY:  Michelle Sherman is here today for repeat clinical assessment. The patient was last seen by  me 06/19/2023. She reports right shoulder pain and weakness. Will likely have right shoulder replacement surgery in 02/2024. She has not noted changes, lumps, or masses in either breast. She continues exemestane  daily. Does have some joint pain, but can't tell if this is related to arthritis or exemestane .  She denies unmanageable hot flashes or night sweats. She denies chest pain, chest pressure, or shortness of breath. She denies headaches or visual disturbances. She denies abdominal pain, nausea, vomiting, or changes in bowel or bladder habits.  She denies fevers or chills. Her appetite is good. Her weight has been stable.  I have reviewed the past medical history, past surgical history, social history and family history with the patient and they are unchanged from previous note.  ALLERGIES:  is allergic to lipitor [atorvastatin ], nickel, sulfa antibiotics, and bydureon  [exenatide ].  MEDICATIONS:  Current Outpatient Medications  Medication Sig Dispense Refill   aspirin EC 81 MG tablet Take 1 tablet by mouth daily.     Biotin 2500 MCG CAPS Take 1 capsule by mouth daily.     Cholecalciferol (VITAMIN D3) 2000 units capsule Take 1 capsule by mouth daily.     empagliflozin  (JARDIANCE ) 25 MG TABS tablet Take 1 tablet (25 mg total) by mouth daily before breakfast. 90 tablet 3   Evolocumab  (REPATHA  SURECLICK) 140 MG/ML SOAJ Inject 140 mg into the skin every 14 (fourteen) days. 6 mL 3   exemestane  (AROMASIN ) 25 MG tablet Take 1 tablet (25 mg total) by mouth daily after breakfast. 90 tablet 3   ezetimibe  (ZETIA ) 10 MG tablet Take 1 tablet (10 mg total) by mouth daily. 90 tablet 3   famotidine  (PEPCID ) 40 MG tablet Take 1 tablet (40 mg total) by mouth 2 (two) times daily. 180 tablet 3   fexofenadine (ALLEGRA) 180 MG tablet Take 180 mg by mouth daily as  needed.     fluticasone (FLONASE) 50 MCG/ACT nasal spray Place 1 spray into both nostrils daily as needed.     icosapent  Ethyl (VASCEPA ) 1 g capsule Take 1 capsule (1 g total) by mouth 2 (two) times daily. 180 capsule 3   levothyroxine  (SYNTHROID ) 112 MCG tablet Take 1 tablet (112 mcg total) by mouth daily. 90 tablet 3   MELATONIN PO Take 5-10 mg by mouth at bedtime.     mesalamine  (LIALDA ) 1.2 g EC tablet Take 2 tablets (2.4 g total) by mouth 2 (two) times daily. 360 tablet 3   metFORMIN  (GLUCOPHAGE ) 1000 MG tablet Take 1 tablet (1,000 mg total) by mouth 2 (two) times daily with a meal. 180 tablet 3   Misc Natural Products (TURMERIC CURCUMIN) CAPS Take 1 capsule by mouth daily. 1000 mg     Multiple Vitamin (MULTIVITAMIN) tablet Take 1 tablet by mouth daily.     ramipril  (ALTACE ) 10 MG capsule Take 1 capsule (10 mg total) by mouth daily. 90 capsule 3   tirzepatide  (MOUNJARO ) 10 MG/0.5ML Pen Inject 10 mg into the skin once a week. 2 mL 0   traZODone  (DESYREL ) 100 MG tablet One-half or 1 tab at bedtime as needed for sleep 90 tablet 1   No current facility-administered medications for this visit.    HISTORY OF PRESENT ILLNESS:   Oncology History Overview Note  Cancer Staging Malignant  neoplasm of upper-outer quadrant of right breast in female, estrogen receptor positive (HCC) Staging form: Breast, AJCC 8th Edition - Clinical stage from 09/12/2019: Stage IA (cT1b, cN0, cM0, G1, ER+, PR+, HER2-) - Signed by Lanny Callander, MD on 09/23/2019    Malignant neoplasm of upper-outer quadrant of right breast in female, estrogen receptor positive (HCC) (Resolved)  09/05/2019 Mammogram   IMPRESSION: Indeterminate 9 x 6 x 6 mm mass involving the OUTER RIGHT breast at the 9 o'clock position approximately 5 cm from the nipple at POSTERIOR depth, located anterior to a normal appearing intramammary lymph Node.      09/12/2019 Cancer Staging   Staging form: Breast, AJCC 8th Edition - Clinical stage from  09/12/2019: Stage IA (cT1b, cN0, cM0, G1, ER+, PR+, HER2-) - Signed by Lanny Callander, MD on 09/23/2019   09/12/2019 Initial Biopsy   Diagnosis 1. Breast, right, needle core biopsy, 9 o'clock posterior - INVASIVE DUCTAL CARCINOMA WITH PAPILLARY FEATURES. 2. Breast, right, needle core biopsy, 9 o'clock anterior - INVASIVE DUCTAL CARCINOMA WITH PAPILLARY FEATURES. Microscopic Comment 1. - 2. The specimens share similar morphologic features. E-cadherin is positive and CK5/6 is negative. P63, Calponin and SMM-1 demonstrate the absence of myoepithelium. Ancillary studies will be reported separately. Results reported to [facility] on 09/15/2019. Intradepartmental consultation (Dr. Alvaro).   09/12/2019 Receptors her2   1. PROGNOSTIC INDICATORS Results: IMMUNOHISTOCHEMICAL AND MORPHOMETRIC ANALYSIS PERFORMED MANUALLY The tumor cells are NEGATIVE for Her2 (1+). Estrogen Receptor: 99%, POSITIVE, STRONG STAINING INTENSITY Progesterone Receptor: 99%, POSITIVE, STRONG STAINING INTENSITY Proliferation Marker Ki67: 5%   09/18/2019 Initial Diagnosis   Malignant neoplasm of upper-outer quadrant of right breast in female, estrogen receptor positive (HCC)   10/05/2019 Genetic Testing   Negative genetic testing:  No pathogenic variants detected on the [facility] Breast Cancer STAT Panel + Common Hereditary Cancers Panel. 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 The report date is 10/05/2019.  The Breast Cancer STAT Panel offered by [facility] includes sequencing and deletion/duplication analysis for the following 9 genes:  ATM, BRCA1, BRCA2, CDH1, CHEK2, PALB2, PTEN, STK11 and TP53. The Common Hereditary Cancers Panel offered by [facility] includes sequencing and/or deletion duplication testing of the following 48 genes: APC, ATM, AXIN2, BARD1, BMPR1A, BRCA1, BRCA2, BRIP1, CDH1, CDK4, CDKN2A (p14ARF), CDKN2A (p16INK4a), CHEK2, CTNNA1, DICER1, EPCAM  (Deletion/duplication testing only), GREM1 (promoter region deletion/duplication testing only), KIT, MEN1, MLH1, MSH2, MSH3, MSH6, MUTYH, NBN, NF1, NTHL1, PALB2, PDGFRA, PMS2, POLD1, POLE, PTEN, RAD50, RAD51C, RAD51D, RNF43, SDHB, SDHC, SDHD, SMAD4, SMARCA4. STK11, TP53, TSC1, TSC2, and VHL.  The following genes were evaluated for sequence changes only: SDHA and HOXB13 c.251G>A variant only.    10/14/2019 Pathology Results   RIGHT BREAST LUMPECTOMY WITH RADIOACTIVE SEED AND SENTINEL LYMPH NODE BIOPSY by Dr Aron    FINAL MICROSCOPIC DIAGNOSIS:   A. BREAST, RIGHT, LUMPECTOMY:  - Multifocal invasive ductal carcinoma with papillary features, grade 2,  spanning 0.9 cm and 0.5 cm.  - Intermediate grade ductal carcinoma in situ.  - Invasive carcinoma present at original inferior margin broadly, final  inferior margin (Part F) is negative.  - Margins are negative for in situ carcinoma.  - Biopsy site.  - See oncology table.   B. BREAST, RIGHT ADDITIONAL POSTERIOR MARGIN, EXCISION:  - Benign breast tissue.   C. BREAST, RIGHT ADDITIONAL LATERAL MARGIN, EXCISION:  - Benign breast tissue.   D. BREAST, RIGHT ADDITIONAL SUPERIOR MARGIN, EXCISION:  - Benign breast tissue.  E. BREAST, RIGHT ADDITIONAL MEDIAL MARGIN, EXCISION:  - Invasive ductal carcinoma with papillary features, grade 2, spanning  0.5 cm.  - Invasive carcinoma is present at the new medial margin broadly.   F. BREAST, RIGHT ADDITIONAL INFERIOR MARGIN, EXCISION:  - Benign breast tissue.   G. LYMPH NODE, RIGHT AXILLARY #1, SENTINEL, EXCISION:  - One of one lymph nodes negative for carcinoma (0/1).   H. LYMPH NODE, RIGHT AXILLARY #2, SENTINEL, EXCISION:  - One of one lymph nodes negative for carcinoma (0/1).   10/14/2019 Oncotype testing   Oncotype  Recurrence Score 0 with distant recurrence risk at 9 years of 3%.  There is less then 1% benefit of chemotherapy   10/14/2019 Cancer Staging   Staging form: Breast, AJCC 8th  Edition - Pathologic stage from 10/14/2019: Stage IA (pT1b, pN0, cM0, G2, ER+, PR+, HER2-, Oncotype DX score: 0) - Signed by Lanny Callander, MD on 01/29/2020   11/11/2019 Pathology Results   RIGHT RE-EXCISION OF BREAST LUMPECTOMY by Dr Aron    FINAL MICROSCOPIC DIAGNOSIS:   A. BREAST, RIGHT, RE-EXCISION OF MEDIAL MARGIN:  - Prior procedure site changes.  - No carcinoma identified.   COMMENT:   P63, Calponin and SMM-1 demonstrate the presence of myoepithelium in the  select focus.    12/22/2019 - 01/19/2020 Radiation Therapy   Adjuvant Radiation with Dr Dewey    05/06/2020 Survivorship   SCP delivered by Lacie Burton, NP    09/04/2022 Mammogram   Bilateral diagnostic 3D mammogram IMPRESSION: No evidence of breast malignancy.   RECOMMENDATION: Per protocol, as the patient is now 2 or more years status post lumpectomy, she may return to annual screening mammography in 1 year. However, given the history of breast cancer, the patient remains eligible for annual diagnostic mammography if preferred. (Code:SM-B-01Y)   BI-RADS CATEGORY  2: Benign.       10/05/2022 Imaging   Bone density test  Normal - significant change noted in bone density of femur   09/05/2023 Mammogram   3D diagnostic mammogram  IMPRESSION: 1. No evidence of new or recurrent breast malignancy. 2. Benign post lumpectomy changes on the right.  RECOMMENDATION: Screening mammogram in one year.(Code:SM-B-01Y) BI-RADS CATEGORY  2: Benign.       REVIEW OF SYSTEMS:   Constitutional: Denies fevers, chills or abnormal weight loss Eyes: Denies blurriness of vision Ears, nose, mouth, throat, and face: Denies mucositis or sore throat Respiratory: Denies cough, dyspnea or wheezes Cardiovascular: Denies palpitation, chest discomfort or lower extremity swelling Gastrointestinal:  Denies nausea, heartburn or change in bowel habits Skin: Denies abnormal skin rashes Lymphatics: Denies new lymphadenopathy or easy  bruising Neurological:Denies numbness, tingling or new weaknesses Behavioral/Psych: Mood is stable, no new changes  All other systems were reviewed with the patient and are negative.   VITALS:   Today's Vitals   12/24/23 1311 12/24/23 1315  BP: 110/68   Pulse: 78   Resp: 17   Temp: 97.8 F (36.6 C)   SpO2: 95%   Weight: 163 lb 8 oz (74.2 kg)   PainSc:  0-No pain   Body mass index is 33.02 kg/m.   Wt Readings from Last 3 Encounters:  12/24/23 163 lb 8 oz (74.2 kg)  12/03/23 162 lb (73.5 kg)  09/28/23 171 lb 9.6 oz (77.8 kg)    Body mass index is 33.02 kg/m.  Performance status (ECOG): 1 - Symptomatic but completely ambulatory  PHYSICAL EXAM:   GENERAL:alert, no distress and comfortable SKIN: skin color, texture, turgor  are normal, no rashes or significant lesions EYES: normal, Conjunctiva are pink and non-injected, sclera clear OROPHARYNX:no exudate, no erythema and lips, buccal mucosa, and tongue normal  NECK: supple, thyroid  normal size, non-tender, without nodularity LYMPH:  no palpable lymphadenopathy in the cervical, axillary or inguinal LUNGS: clear to auscultation and percussion with normal breathing effort HEART: regular rate & rhythm and no murmurs and no lower extremity edema ABDOMEN:abdomen soft, non-tender and normal bowel sounds Musculoskeletal:no cyanosis of digits and no clubbing. Right shoulder discomfort and stiffness. NEURO: alert & oriented x 3 with fluent speech, no focal motor/sensory deficits BREAST: the right breast has well healed lumpectomy scar. Mild skin changes noted due to radiation. No palpable lumps or masses are noted today. There is no nipple inversion or nipple discharge. There is no axillary lymphadenopathy on the right. Right axillary surgical scar is well healed. There are no palpable lumps or masses in the left breast. There is generalized dense tissue throughout the left breast. There is no nipple inversion or nipple discharge. There  is no axillary lymphadenopathy on the left.   LABORATORY DATA:  I have reviewed the data as listed    Component Value Date/Time   NA 141 12/24/2023 1249   NA 141 02/15/2022 1038   K 4.5 12/24/2023 1249   CL 104 12/24/2023 1249   CO2 29 12/24/2023 1249   GLUCOSE 111 (H) 12/24/2023 1249   BUN 17 12/24/2023 1249   BUN 15 02/15/2022 1038   CREATININE 0.65 12/24/2023 1249   CREATININE 0.55 (L) 09/28/2023 1147   CALCIUM  10.1 12/24/2023 1249   PROT 7.1 12/24/2023 1249   ALBUMIN 4.5 12/24/2023 1249   AST 16 12/24/2023 1249   ALT 13 12/24/2023 1249   ALKPHOS 73 12/24/2023 1249   BILITOT 0.4 12/24/2023 1249   GFRNONAA >60 12/24/2023 1249   GFRNONAA 95 01/07/2020 0907   GFRAA 110 01/07/2020 0907    Lab Results  Component Value Date   WBC 6.3 12/24/2023   NEUTROABS 3.7 12/24/2023   HGB 14.6 12/24/2023   HCT 44.8 12/24/2023   MCV 90.9 12/24/2023   PLT 357 12/24/2023

## 2023-12-23 NOTE — Assessment & Plan Note (Signed)
 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 -S/p right lumpectomy with Dr Cherlynn Cornfield on 10/14/19 and right breast re-excision on 11/11/19 due to positive margins. Oncotype showed RS 0, low risk. -she completed adjuvant Radiation 12/22/19-01/19/20 -Se began adjuvant anastrozole  in 01/2020. She is tolerating well overall, except rare hot flash and moderate joint pain mainly in her hands today.  This is likely multifactorial, but also related to AI.  -Due to increasing joint pain from anastrozole  and arthritis, we decided together to hold anastrozole  for 2-4 weeks to see how pain responds.  -Bilateral diagnostic mammogram done 09/04/2022 with benign results. -DEXA scan done 10/05/2022 showing normal bone density. -started exemestane  09/26/2022.  -Continue surveillance

## 2023-12-24 ENCOUNTER — Inpatient Hospital Stay: Admitting: Nurse Practitioner

## 2023-12-24 ENCOUNTER — Encounter: Payer: Self-pay | Admitting: Nurse Practitioner

## 2023-12-24 ENCOUNTER — Inpatient Hospital Stay: Attending: Nurse Practitioner

## 2023-12-24 VITALS — BP 110/68 | HR 78 | Temp 97.8°F | Resp 17 | Wt 163.5 lb

## 2023-12-24 DIAGNOSIS — R232 Flushing: Secondary | ICD-10-CM | POA: Insufficient documentation

## 2023-12-24 DIAGNOSIS — Z1721 Progesterone receptor positive status: Secondary | ICD-10-CM | POA: Diagnosis not present

## 2023-12-24 DIAGNOSIS — Z7982 Long term (current) use of aspirin: Secondary | ICD-10-CM | POA: Diagnosis not present

## 2023-12-24 DIAGNOSIS — M25511 Pain in right shoulder: Secondary | ICD-10-CM | POA: Insufficient documentation

## 2023-12-24 DIAGNOSIS — Z170 Estrogen receptor positive status [ER+]: Secondary | ICD-10-CM | POA: Diagnosis not present

## 2023-12-24 DIAGNOSIS — Z7984 Long term (current) use of oral hypoglycemic drugs: Secondary | ICD-10-CM | POA: Diagnosis not present

## 2023-12-24 DIAGNOSIS — M2550 Pain in unspecified joint: Secondary | ICD-10-CM | POA: Insufficient documentation

## 2023-12-24 DIAGNOSIS — C50411 Malignant neoplasm of upper-outer quadrant of right female breast: Secondary | ICD-10-CM | POA: Diagnosis present

## 2023-12-24 DIAGNOSIS — Z1732 Human epidermal growth factor receptor 2 negative status: Secondary | ICD-10-CM | POA: Diagnosis not present

## 2023-12-24 DIAGNOSIS — Z923 Personal history of irradiation: Secondary | ICD-10-CM | POA: Insufficient documentation

## 2023-12-24 DIAGNOSIS — M129 Arthropathy, unspecified: Secondary | ICD-10-CM | POA: Insufficient documentation

## 2023-12-24 DIAGNOSIS — Z79811 Long term (current) use of aromatase inhibitors: Secondary | ICD-10-CM | POA: Insufficient documentation

## 2023-12-24 DIAGNOSIS — R531 Weakness: Secondary | ICD-10-CM | POA: Insufficient documentation

## 2023-12-24 LAB — CBC WITH DIFFERENTIAL (CANCER CENTER ONLY)
Abs Immature Granulocytes: 0.02 K/uL (ref 0.00–0.07)
Basophils Absolute: 0.1 K/uL (ref 0.0–0.1)
Basophils Relative: 1 %
Eosinophils Absolute: 0.2 K/uL (ref 0.0–0.5)
Eosinophils Relative: 3 %
HCT: 44.8 % (ref 36.0–46.0)
Hemoglobin: 14.6 g/dL (ref 12.0–15.0)
Immature Granulocytes: 0 %
Lymphocytes Relative: 30 %
Lymphs Abs: 1.9 K/uL (ref 0.7–4.0)
MCH: 29.6 pg (ref 26.0–34.0)
MCHC: 32.6 g/dL (ref 30.0–36.0)
MCV: 90.9 fL (ref 80.0–100.0)
Monocytes Absolute: 0.5 K/uL (ref 0.1–1.0)
Monocytes Relative: 8 %
Neutro Abs: 3.7 K/uL (ref 1.7–7.7)
Neutrophils Relative %: 58 %
Platelet Count: 357 K/uL (ref 150–400)
RBC: 4.93 MIL/uL (ref 3.87–5.11)
RDW: 13.5 % (ref 11.5–15.5)
WBC Count: 6.3 K/uL (ref 4.0–10.5)
nRBC: 0 % (ref 0.0–0.2)

## 2023-12-24 LAB — CMP (CANCER CENTER ONLY)
ALT: 13 U/L (ref 0–44)
AST: 16 U/L (ref 15–41)
Albumin: 4.5 g/dL (ref 3.5–5.0)
Alkaline Phosphatase: 73 U/L (ref 38–126)
Anion gap: 8 (ref 5–15)
BUN: 17 mg/dL (ref 8–23)
CO2: 29 mmol/L (ref 22–32)
Calcium: 10.1 mg/dL (ref 8.9–10.3)
Chloride: 104 mmol/L (ref 98–111)
Creatinine: 0.65 mg/dL (ref 0.44–1.00)
GFR, Estimated: >60 mL/min (ref >60)
Glucose, Bld: 111 mg/dL — ABNORMAL HIGH (ref 70–99)
Potassium: 4.5 mmol/L (ref 3.5–5.1)
Sodium: 141 mmol/L (ref 135–145)
Total Bilirubin: 0.4 mg/dL (ref 0.0–1.2)
Total Protein: 7.1 g/dL (ref 6.5–8.1)

## 2023-12-31 ENCOUNTER — Ambulatory Visit: Admitting: Family Medicine

## 2024-01-11 ENCOUNTER — Other Ambulatory Visit: Payer: Self-pay

## 2024-01-11 ENCOUNTER — Encounter: Payer: Self-pay | Admitting: Family Medicine

## 2024-01-11 DIAGNOSIS — E1169 Type 2 diabetes mellitus with other specified complication: Secondary | ICD-10-CM

## 2024-01-11 DIAGNOSIS — E118 Type 2 diabetes mellitus with unspecified complications: Secondary | ICD-10-CM

## 2024-01-11 DIAGNOSIS — E66812 Obesity, class 2: Secondary | ICD-10-CM

## 2024-01-11 MED ORDER — TIRZEPATIDE 10 MG/0.5ML ~~LOC~~ SOAJ: 10.0000 mg | SUBCUTANEOUS | 2 refills | Status: DC

## 2024-02-04 ENCOUNTER — Other Ambulatory Visit: Payer: Self-pay | Admitting: Family Medicine

## 2024-02-04 ENCOUNTER — Other Ambulatory Visit: Payer: Self-pay

## 2024-02-04 DIAGNOSIS — E66812 Obesity, class 2: Secondary | ICD-10-CM

## 2024-02-04 DIAGNOSIS — E118 Type 2 diabetes mellitus with unspecified complications: Secondary | ICD-10-CM

## 2024-02-04 DIAGNOSIS — E1169 Type 2 diabetes mellitus with other specified complication: Secondary | ICD-10-CM

## 2024-02-04 MED ORDER — TIRZEPATIDE 10 MG/0.5ML ~~LOC~~ SOAJ: 10.0000 mg | SUBCUTANEOUS | 2 refills | Status: AC

## 2024-02-05 NOTE — Progress Notes (Unsigned)
 "   Virtual Visit via Telephone Note   Because of Tyrica B Papadopoulos co-morbid illnesses, she is at least at moderate risk for complications without adequate follow up.  This format is felt to be most appropriate for this patient at this time.  Due to technical limitations with video connection (technology), today's appointment will be conducted as an audio only telehealth visit, and Ary B Swetz verbally agreed to proceed in this manner.   All issues noted in this document were discussed and addressed.  No physical exam could be performed with this format.  Evaluation Performed:  Preoperative cardiovascular risk assessment _____________   Date:  02/06/2024   Patient ID:  YAZMEN BRIONES, DOB Nov 07, 1950, MRN [id number] Patient Location:  Home Provider location:   Office  Primary Care Provider:  Duanne Butler DASEN, MD Primary Cardiologist:  Kardie Tobb, DO  Chief Complaint / Patient Profile   74 y.o. y/o female with a h/o breast cancer s/p lumpectomy, sleep apnea, diabetes, HTN, HLD nonobstructive CAD on CCTA 01/2022 who is pending right reverse shoulder arthroplasty and presents today for telephonic preoperative cardiovascular risk assessment.  History of Present Illness    MANDE AUVIL is a 74 y.o. female who presents via audio/video conferencing for a telehealth visit today.  Pt was last seen in cardiology clinic on 02/21/23 by Dr. Sheena.  At that time NURA CAHOON was doing well.  The patient is now pending procedure as outlined above. Since her last visit, she  denies chest pain, shortness of breath, lower extremity edema, fatigue, palpitations, diaphoresis, weakness, presyncope, syncope, orthopnea, and PND. She is active as the primary caregiver for her husband and is able to achieve > 4 METS activity without concerning cardiac symptoms.    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 lfts in 100s, resolved with cessation of lipitor. Normal liver ultrasound   Elevated CK 08/15/2021   Family history of adverse reaction to anesthesia    sister, hard to wake up    Family history of lung cancer    Family history of pancreatic cancer    Family history of pancreatic cancer    Genetic testing 10/06/2019   Negative genetic testing:  No pathogenic variants detected on the [facility] Breast Cancer STAT Panel + Common Hereditary Cancers Panel. 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 The report date is 10/05/2019.     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 I discussed option of draining seroma and ways to do this as well as risks.  I discussed that draining would likely make her  breast less heavy feeling.  I reviewed that drainage would likely make the right breast smaller and the nipple point inferiorly.  I discussed that the seroma could recur and we would be in    Severe obesity (BMI 35.0-35.9 with comorbidity) (HCC) 11/01/2016   Sleep apnea    wears CPAP nightly   Thyroid  disease    Ulcerative colitis [facility])    Past Surgical History:  Procedure Laterality Date   BREAST LUMPECTOMY Right 10/2019   BREAST LUMPECTOMY WITH RADIOACTIVE SEED AND SENTINEL LYMPH NODE BIOPSY Right 10/14/2019   Procedure: RIGHT BREAST LUMPECTOMY WITH RADIOACTIVE SEED AND SENTINEL LYMPH NODE BIOPSY;  Surgeon: Aron Shoulders, MD;  Location: [facility];  Service: General;  Laterality: Right;   CHOLECYSTECTOMY     RE-EXCISION OF BREAST LUMPECTOMY Right 11/11/2019   Procedure: RIGHT RE-EXCISION OF BREAST LUMPECTOMY;  Surgeon: Aron Shoulders, MD;  Location: [facility];  Service: General;  Laterality: Right;  RNFA   SKIN SURGERY  12/2015   Fatty tumor removed   TONSILLECTOMY AND ADENOIDECTOMY  1956    Allergies  Allergies[1]  Home Medications    Prior to Admission medications  Medication Sig Start Date End Date Taking? Authorizing Provider  aspirin EC 81 MG tablet Take 1 tablet by mouth daily.    [provider]  Biotin 2500 MCG CAPS Take 1 capsule by mouth daily.    [provider]  Cholecalciferol (VITAMIN D3) 2000 units capsule Take 1 capsule by mouth daily.    [provider]  empagliflozin  (JARDIANCE ) 25 MG TABS tablet Take 1 tablet (25 mg total) by mouth daily before breakfast. 08/13/23   Duanne Butler DASEN, MD  Evolocumab  (REPATHA  SURECLICK) 140 MG/ML SOAJ Inject 140 mg into the skin every 14 (fourteen) days. 10/05/23   Tobb, Kardie, DO  exemestane  (AROMASIN ) 25 MG tablet Take 1 tablet (25 mg total) by mouth daily after breakfast. 09/18/23   Hanford Powell BRAVO, NP  ezetimibe  (ZETIA ) 10 MG tablet Take 1 tablet (10 mg total) by mouth  daily. 08/20/23   Duanne Butler DASEN, MD  famotidine  (PEPCID ) 40 MG tablet Take 1 tablet (40 mg total) by mouth 2 (two) times daily. 09/17/23 09/11/24  Federico Rosario BROCKS, MD  fexofenadine (ALLEGRA) 180 MG tablet Take 180 mg by mouth daily as needed.    [provider]  fluticasone (FLONASE) 50 MCG/ACT nasal spray Place 1 spray into both nostrils daily as needed. 01/09/14   [provider]  icosapent  Ethyl (VASCEPA ) 1 g capsule Take 1 capsule (1 g total) by mouth 2 (two) times daily. 03/05/23   Tobb, Kardie, DO  levothyroxine  (SYNTHROID ) 112 MCG tablet Take 1 tablet (112 mcg total) by mouth daily. 09/13/23   Duanne Butler DASEN, MD  MELATONIN PO Take 5-10 mg by mouth at bedtime.    [provider]  mesalamine  (LIALDA ) 1.2 g EC tablet Take 2 tablets (2.4 g total) by mouth 2 (two) times daily. 09/17/23 09/11/24  Federico Rosario BROCKS, MD  metFORMIN  (GLUCOPHAGE ) 1000 MG tablet Take 1 tablet (1,000 mg total)  by mouth 2 (two) times daily with a meal. 09/13/23   Pickard, Butler DASEN, MD  Misc Natural Products (TURMERIC CURCUMIN) CAPS Take 1 capsule by mouth daily. 1000 mg    [provider]  Multiple Vitamin (MULTIVITAMIN) tablet Take 1 tablet by mouth daily.    [provider]  ramipril  (ALTACE ) 10 MG capsule Take 1 capsule (10 mg total) by mouth daily. 08/13/23   Duanne Butler DASEN, MD  tirzepatide  (MOUNJARO ) 10 MG/0.5ML Pen Inject 10 mg into the skin once a week. 02/04/24   Duanne Butler DASEN, MD  traZODone  (DESYREL ) 100 MG tablet One-half or 1 tab at bedtime as needed for sleep 07/31/23   Neysa Reggy BIRCH, MD    Physical Exam    Vital Signs:  Jalaiya B Gambrel does not have vital signs available for review today.  Given telephonic nature of communication, physical exam is limited. AAOx3. NAD. Normal affect.  Speech and respirations are unlabored.  Accessory Clinical Findings    None  Assessment & Plan    1.  Preoperative Cardiovascular Risk Assessment: According to the Revised  Cardiac Risk Index (RCRI), her Perioperative Risk of Major Cardiac Event is (%): 0.9. Her Functional Capacity in METs is: 7.04 according to the Duke Activity Status Index (DASI). The patient is doing well from a cardiac perspective. Therefore, based on ACC/AHA guidelines, the patient would be at acceptable risk for the planned procedure without further cardiovascular testing.   The patient was advised that if she develops new symptoms prior to surgery to contact our office to arrange for a follow-up visit, and she verbalized understanding.  Per office protocol, she may hold aspirin for 5-7 days prior to procedure and should resume as soon as hemodynamically stable postoperatively.   A copy of this note will be routed to requesting surgeon.  Time:   Today, I have spent 10 minutes with the patient with telehealth technology discussing medical history, symptoms, and management plan.     Rosaline EMERSON Bane, NP-C  02/06/2024, 10:03 AM [address], [address] [place], [place] [address] Office [phone] Fax [phone]      [1]  Allergies Allergen Reactions   Lipitor [Atorvastatin ] Other (See Comments)    Hepatitis and myopathy, lfts 100s and elevated CK with symptoms   Nickel Other (See Comments) and Rash    drainage   Sulfa Antibiotics Rash   Bydureon  [Exenatide ] Nausea And Vomiting   "

## 2024-02-06 ENCOUNTER — Encounter: Payer: Self-pay | Admitting: Nurse Practitioner

## 2024-02-06 ENCOUNTER — Ambulatory Visit: Admitting: Nurse Practitioner

## 2024-02-06 DIAGNOSIS — Z01810 Encounter for preprocedural cardiovascular examination: Secondary | ICD-10-CM

## 2024-02-11 ENCOUNTER — Encounter: Payer: Self-pay | Admitting: Cardiology

## 2024-02-15 ENCOUNTER — Telehealth: Payer: Self-pay | Admitting: Cardiology

## 2024-02-15 NOTE — Telephone Encounter (Signed)
" °*  STAT* If patient is at the pharmacy, call can be transferred to refill team.   1. Which medications need to be refilled? (please list name of each medication and dose if known)   icosapent  Ethyl (VASCEPA ) 1 g capsule   2. Would you like to learn more about the convenience, safety, & potential cost savings by using the [facility]?   3. Are you open to using the [facility] (Type [facility]. ).  4. Which pharmacy/location (including street and city if local pharmacy) is medication to be sent to?  [facility] - [place], [place] - [address]   5. Do they need a 30 day or 90 day supply?   90 day  Patient stated she still has some medication.  Patient has appointment scheduled with Dr. Sheena on 3/20. "

## 2024-02-19 MED ORDER — ICOSAPENT ETHYL 1 G PO CAPS: 1.0000 g | ORAL_CAPSULE | Freq: Two times a day (BID) | ORAL | 3 refills | Status: AC

## 2024-02-19 NOTE — Telephone Encounter (Signed)
 Pt scheduled to see Dr. Sheena 04/11/24, refill sent.

## 2024-02-22 ENCOUNTER — Encounter: Payer: Self-pay | Admitting: Family Medicine

## 2024-02-22 ENCOUNTER — Ambulatory Visit: Admitting: Family Medicine

## 2024-02-22 VITALS — BP 120/62 | HR 81 | Temp 97.8°F | Ht 59.0 in | Wt 157.0 lb

## 2024-02-22 DIAGNOSIS — Z7984 Long term (current) use of oral hypoglycemic drugs: Secondary | ICD-10-CM | POA: Diagnosis not present

## 2024-02-22 DIAGNOSIS — E119 Type 2 diabetes mellitus without complications: Secondary | ICD-10-CM

## 2024-02-22 DIAGNOSIS — Z7985 Long-term (current) use of injectable non-insulin antidiabetic drugs: Secondary | ICD-10-CM

## 2024-02-22 DIAGNOSIS — Z01818 Encounter for other preprocedural examination: Secondary | ICD-10-CM

## 2024-02-22 NOTE — Progress Notes (Signed)
 " Subjective:     Patient ID: Michelle Sherman, female   DOB: [birth year], 74 y.o.   MRN: [id number]  HPI 06/15/23 Patient is a very pleasant 74 year old Caucasian female here today for follow-up.  She is currently on Rybelsus  7 mg a day.  She does report feeling constipated and bloated and nauseous after taking the medication.  She denies any abdominal pain or chest pain or shortness of breath.  She is due for fasting lab work.  Of note, she also complains of severe pain in her right shoulder.  Passively she is able to abduct her arm to 180 degrees.  However she has severe pain and weakness with empty can testing.  She also has pain with O'Brien's maneuver.  I believe that the patient likely has an irritation or even possible tear in the supraspinatus tendon.  has been going on now several months and gradually getting worse.  We discussed her options and she is interested in physical therapy.  09/28/23 Patient has tried and failed physical therapy 4 weeks.  She continues to have severe pain in her right shoulder.  It keeps her awake at night.  She now has tenderness and pain at the biceps tendon near its insertion at the glenohumeral joint.  She is tender to palpation in the groove.  She has pain with resisted flexion of the elbow.  She also has pain with empty can testing and pain with external rotation of the shoulder.  She has pain with O'Brien's maneuver.  She is unable to take NSAIDs due to ulcerative colitis.  She is requesting an MRI of the shoulder.  She is also here today to recheck her hemoglobin A1c.  She is currently on Mounjaro  along with her metformin .  Her last A1c in May was 7.3  02/22/24 Patient is here today for surgical clearance for a right shoulder replacement and repair of rotator cuff.  Patient denies any chest pain.  She denies any angina.  She denies any dyspnea on exertion.  She denies any palpitation.  She denies any syncope or near syncope.  She does have a history of diabetes.  Her  last hemoglobin A1c was well-controlled in September.  She recently had a CMP and a CBC in December that were completely normal. 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 lfts in 100s, resolved with cessation of lipitor. Normal liver ultrasound   Elevated CK 08/15/2021   Family history of adverse reaction to anesthesia    sister, hard to wake up    Family history of lung cancer    Family history of pancreatic cancer    Family history of pancreatic cancer    Genetic testing 10/06/2019   Negative genetic testing:  No pathogenic variants detected on the [facility] Breast Cancer STAT Panel + Common Hereditary Cancers Panel. A variant  of uncertain significance (VUS) was detected in the MLH1 gene called c.221A>T. The report date is 10/05/2019.     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 I discussed option of draining seroma and ways to do this as well as risks.  I discussed that draining would likely make her breast less heavy feeling.  I reviewed that drainage would likely make the right breast smaller and the nipple point inferiorly.  I discussed that the seroma could recur and we would be in    Severe obesity (BMI 35.0-35.9 with comorbidity) (HCC) 11/01/2016   Sleep apnea    wears CPAP nightly   Thyroid  disease    Ulcerative colitis [facility])    Past Surgical History:  Procedure Laterality Date   BREAST LUMPECTOMY Right 10/2019   BREAST LUMPECTOMY WITH RADIOACTIVE SEED AND SENTINEL LYMPH NODE BIOPSY Right 10/14/2019   Procedure: RIGHT BREAST LUMPECTOMY WITH RADIOACTIVE SEED AND SENTINEL LYMPH NODE BIOPSY;  Surgeon: Aron Shoulders, MD;  Location: [facility];  Service: General;  Laterality: Right;   CHOLECYSTECTOMY     RE-EXCISION OF BREAST LUMPECTOMY Right 11/11/2019   Procedure: RIGHT RE-EXCISION OF BREAST LUMPECTOMY;  Surgeon: Aron Shoulders, MD;  Location: [facility];  Service: General;  Laterality: Right;  RNFA   SKIN SURGERY  12/2015   Fatty tumor removed   TONSILLECTOMY AND ADENOIDECTOMY  1956   Current Outpatient Medications on File Prior to Visit  Medication Sig Dispense Refill   aspirin EC 81 MG tablet Take 1 tablet by mouth daily.     Biotin 2500 MCG CAPS Take 1 capsule by mouth daily.     Cholecalciferol (VITAMIN D3) 2000 units capsule Take 1 capsule by mouth daily.     empagliflozin  (JARDIANCE ) 25 MG TABS tablet Take 1 tablet (25 mg total) by mouth daily before breakfast. 90 tablet 3   Evolocumab  (REPATHA  SURECLICK) 140 MG/ML SOAJ Inject 140 mg into the skin every 14 (fourteen) days. 6 mL 3   exemestane  (AROMASIN ) 25 MG tablet Take 1 tablet (25 mg total) by mouth daily after breakfast. 90 tablet 3   ezetimibe  (ZETIA )  10 MG tablet Take 1 tablet (10 mg total) by mouth daily. 90 tablet 3   famotidine  (PEPCID ) 40 MG tablet Take 1 tablet (40 mg total) by mouth 2 (two) times daily. 180 tablet 3   fexofenadine (ALLEGRA) 180 MG tablet Take 180 mg by mouth daily as needed.     fluticasone (FLONASE) 50 MCG/ACT nasal spray Place 1 spray into both nostrils daily as needed.     icosapent  Ethyl (VASCEPA ) 1 g capsule Take 1 capsule (1 g total) by mouth 2 (two) times daily. 180 capsule 3   levothyroxine  (SYNTHROID ) 112 MCG tablet Take 1 tablet (112 mcg total) by mouth daily. 90 tablet 3   MELATONIN PO Take 5-10 mg by mouth at bedtime.     mesalamine  (  LIALDA ) 1.2 g EC tablet Take 2 tablets (2.4 g total) by mouth 2 (two) times daily. 360 tablet 3   metFORMIN  (GLUCOPHAGE ) 1000 MG tablet Take 1 tablet (1,000 mg total) by mouth 2 (two) times daily with a meal. 180 tablet 3   Misc Natural Products (TURMERIC CURCUMIN) CAPS Take 1 capsule by mouth daily. 1000 mg     Multiple Vitamin (MULTIVITAMIN) tablet Take 1 tablet by mouth daily.     ramipril  (ALTACE ) 10 MG capsule Take 1 capsule (10 mg total) by mouth daily. 90 capsule 3   tirzepatide  (MOUNJARO ) 10 MG/0.5ML Pen Inject 10 mg into the skin once a week. 6 mL 2   traZODone  (DESYREL ) 100 MG tablet One-half or 1 tab at bedtime as needed for sleep 90 tablet 1   No current facility-administered medications on file prior to visit.   Allergies  Allergen Reactions   Lipitor [Atorvastatin ] Other (See Comments)    Hepatitis and myopathy, lfts 100s and elevated CK with symptoms   Nickel Other (See Comments) and Rash    drainage   Sulfa Antibiotics Rash   Bydureon  [Exenatide ] Nausea And Vomiting   Social History   Socioeconomic History   Marital status: Married    Spouse name: Not on file   Number of children: 1   Years of education: Not on file   Highest education level: Not on file  Occupational History   Occupation: Retired     Comment: Retail Buyer   Tobacco Use   Smoking  status: Never   Smokeless tobacco: Never  Vaping Use   Vaping status: Never Used  Substance and Sexual Activity   Alcohol use: Yes    Comment: occa   Drug use: No   Sexual activity: Yes    Birth control/protection: Post-menopausal  Other Topics Concern   Not on file  Social History Narrative   Married, cares for disabled husband,  no tob, Etoh or drug use; no exercise   Social Drivers of Health   Tobacco Use: Low Risk (02/22/2024)   Patient History    Smoking Tobacco Use: Never    Smokeless Tobacco Use: Never    Passive Exposure: Not on file  Financial Resource Strain: Low Risk (06/08/2023)   Received from [facility]   Overall Financial Resource Strain (CARDIA)    Difficulty of Paying Living Expenses: Not hard at all  Food Insecurity: No Food Insecurity (06/08/2023)   Received from [facility]   Epic    Within the past 12 months, you worried that your food would run out before you got the money to buy more.: Never true    Within the past 12 months, the food you bought just didn't last and you didn't have money to get more.: Never true  Transportation Needs: No Transportation Needs (06/08/2023)   Received from [facility]   PRAPARE - Transportation    Lack of Transportation (Medical): No    Lack of Transportation (Non-Medical): No  Physical Activity: Sufficiently Active (06/08/2023)   Received from [facility]   Exercise Vital Sign    On average, how many days per week do you engage in moderate to strenuous exercise (like a brisk walk)?: 3 days    On average, how many minutes do you engage in exercise at this level?: 60 min  Stress: No Stress Concern Present (06/08/2023)   Received from [facility] of Occupational [facility] - Occupational Stress Questionnaire    Feeling  of Stress : Only a little  Social Connections: Socially Integrated (06/08/2023)   Received from [facility]: [facility]   Social Connection and Isolation Panel    In a typical  week, how many times do you talk on the phone with family, friends, or neighbors?: Three times a week    How often do you get together with friends or relatives?: Three times a week    How often do you attend church or religious services?: More than 4 times per year    Do you belong to any clubs or organizations such as church groups, unions, fraternal or athletic groups, or school groups?: Yes    How often do you attend meetings of the clubs or organizations you belong to?: More than 4 times per year    Are you married, widowed, divorced, separated, never married, or living with a partner?: Married  Intimate Partner Violence: Not At Risk (06/08/2023)   Received from [facility]   Epic    Within the last year, have you been afraid of your partner or ex-partner?: No    Within the last year, have you been humiliated or emotionally abused in other ways by your partner or ex-partner?: No    Within the last year, have you been kicked, hit, slapped, or otherwise physically hurt by your partner or ex-partner?: No    Within the last year, have you been raped or forced to have any kind of sexual activity by your partner or ex-partner?: No  Depression (PHQ2-9): Low Risk (12/24/2023)   Depression (PHQ2-9)    PHQ-2 Score: 0  Alcohol Screen: Low Risk (07/13/2022)   Alcohol Screen    Last Alcohol Screening Score (AUDIT): 0  Housing: Low Risk (07/13/2022)   Housing    Last Housing Risk Score: 0  Utilities: Low Risk (06/08/2023)   Received from [facility]   Utilities    Within the past 12 months, have you been unable to get utilities(heat, electricity) when it was really needed?: No  Health Literacy: Low Risk (06/08/2023)   Received from [facility] Literacy    How often do you need to have someone help you when you read instructions, pamphlets, or other written material from your doctor or pharmacy?: Never     Review of Systems  All other systems reviewed and are negative.       Objective:   Physical Exam Constitutional:      Appearance: Normal appearance. She is obese.  Cardiovascular:     Rate and Rhythm: Normal rate and regular rhythm.     Heart sounds: Normal heart sounds. No murmur heard.    No friction rub. No gallop.  Pulmonary:     Effort: Pulmonary effort is normal.     Breath sounds: Normal breath sounds.  Musculoskeletal:     Right shoulder: Tenderness present. No crepitus. Decreased range of motion. Decreased strength.     Right lower leg: No edema.     Left lower leg: No edema.  Neurological:     Mental Status: She is alert.        Assessment:    Controlled type 2 diabetes mellitus without complication, without long-term current use of insulin (HCC) - Plan: Hemoglobin A1c, Microalbumin / creatinine urine ratio     Plan:     I see no contraindications to proceeding with shoulder surgery.  Specifically, the patient has no evidence of uncontrolled heart failure or unstable angina.  CBC has shown  no evidence of anemia in December.  CMP was normal in December.  I will check a hemoglobin A1c today.  If her hemoglobin A1c is well-controlled, the patient does not require any additional workup and is medically cleared to proceed with surgery.  While checking labs I will get a urine microalbumin to creatinine ratio.  Ideally I like to see her A1c less than 6.5 and the microalbumin to creatinine ratio less than 30. "

## 2024-02-23 LAB — HEMOGLOBIN A1C
Hgb A1c MFr Bld: 5.7 % — ABNORMAL HIGH
Mean Plasma Glucose: 117 mg/dL
eAG (mmol/L): 6.5 mmol/L

## 2024-02-23 LAB — MICROALBUMIN / CREATININE URINE RATIO
Creatinine, Urine: 30 mg/dL (ref 20–275)
Microalb Creat Ratio: 17 mg/g{creat}
Microalb, Ur: 0.5 mg/dL

## 2024-02-25 ENCOUNTER — Ambulatory Visit: Payer: Self-pay | Admitting: Family Medicine

## 2024-03-17 ENCOUNTER — Encounter: Admitting: Family Medicine

## 2024-04-11 ENCOUNTER — Ambulatory Visit: Admitting: Cardiology

## 2024-06-23 ENCOUNTER — Inpatient Hospital Stay: Admitting: Nurse Practitioner

## 2024-06-23 ENCOUNTER — Inpatient Hospital Stay

## 2024-12-01 ENCOUNTER — Ambulatory Visit
